# Patient Record
Sex: Male | Born: 1945
Health system: Southern US, Community
[De-identification: ages and names within clinical notes are randomized; demographics above are authoritative.]

## PROBLEM LIST (undated history)

## (undated) DIAGNOSIS — K219 Gastro-esophageal reflux disease without esophagitis: Secondary | ICD-10-CM

## (undated) DIAGNOSIS — N400 Enlarged prostate without lower urinary tract symptoms: Secondary | ICD-10-CM

## (undated) DIAGNOSIS — C3492 Malignant neoplasm of unspecified part of left bronchus or lung: Secondary | ICD-10-CM

## (undated) HISTORY — PX: NO PAST SURGERIES: SHX2092

## (undated) HISTORY — DX: Gastro-esophageal reflux disease without esophagitis: K21.9

---

## 2009-03-19 ENCOUNTER — Ambulatory Visit: Payer: Self-pay | Admitting: Internal Medicine

## 2012-08-08 ENCOUNTER — Ambulatory Visit: Payer: Self-pay | Admitting: Family Medicine

## 2012-08-18 ENCOUNTER — Ambulatory Visit: Payer: Self-pay

## 2013-06-20 ENCOUNTER — Ambulatory Visit: Payer: Self-pay

## 2013-10-24 ENCOUNTER — Ambulatory Visit: Payer: Self-pay

## 2016-04-13 ENCOUNTER — Ambulatory Visit
Admission: EM | Admit: 2016-04-13 | Discharge: 2016-04-13 | Disposition: A | Discharge status: Home / Self Care | Payer: Medicare HMO | Attending: Family Medicine | Admitting: Family Medicine

## 2016-04-13 DIAGNOSIS — J0100 Acute maxillary sinusitis, unspecified: Secondary | ICD-10-CM | POA: Diagnosis not present

## 2016-04-13 MED ORDER — AMOXICILLIN 875 MG PO TABS: 875.0000 mg | ORAL_TABLET | Freq: Two times a day (BID) | ORAL | 0 refills | Status: DC

## 2016-04-13 NOTE — ED Triage Notes (Signed)
Pt c/o head pressure and headache for over a week. Lots of facial pressure.

## 2016-04-13 NOTE — ED Provider Notes (Signed)
[facility]    CSN: [id number] Arrival date & time: 04/13/16  1204     History   Chief Complaint Chief Complaint  Patient presents with  . Sinusitis    HPI [name] is a 71 y.o. male.   The history is provided by the patient.  URI  Presenting symptoms: congestion, cough and facial pain   Severity:  Moderate Onset quality:  Sudden Duration:  1 week Timing:  Constant Progression:  Worsening Chronicity:  New Relieved by:  None tried Associated symptoms: headaches and sinus pain   Associated symptoms: no wheezing   Risk factors: sick contacts   Risk factors: not elderly, no chronic cardiac disease, no chronic kidney disease, no chronic respiratory disease, no diabetes mellitus, no immunosuppression, no recent illness and no recent travel     History reviewed. No pertinent past medical history.  There are no active problems to display for this patient.   History reviewed. No pertinent surgical history.     Home Medications    Prior to Admission medications   Medication Sig Start Date End Date Taking? Authorizing Provider  amoxicillin (AMOXIL) 875 MG tablet Take 1 tablet (875 mg total) by mouth 2 (two) times daily. 04/13/16   Norval Gable, MD    Family History History reviewed. No pertinent family history.  Social History Social History  Substance Use Topics  . Smoking status: Never Smoker  . Smokeless tobacco: Never Used  . Alcohol use No     Allergies   Patient has no known allergies.   Review of Systems Review of Systems  HENT: Positive for congestion and sinus pain.   Respiratory: Positive for cough. Negative for wheezing.   Neurological: Positive for headaches.     Physical Exam Triage Vital Signs ED Triage Vitals  Enc Vitals Group     BP 04/13/16 1256 (!) 152/67     Pulse Rate 04/13/16 1256 67     Resp 04/13/16 1256 18     Temp 04/13/16 1256 98.2 F (36.8 C)     Temp Source 04/13/16 1256 Oral     SpO2 04/13/16  1256 99 %     Weight 04/13/16 1255 265 lb (120.2 kg)     Height 04/13/16 1255 '5\' 11"'$  (1.803 m)     Head Circumference --      Peak Flow --      Pain Score 04/13/16 1256 5     Pain Loc --      Pain Edu? --      Excl. in [place]? --    No data found.   Updated Vital Signs BP (!) 152/67 (BP Location: Left Arm)   Pulse 67   Temp 98.2 F (36.8 C) (Oral)   Resp 18   Ht '5\' 11"'$  (1.803 m)   Wt 265 lb (120.2 kg)   SpO2 99%   BMI 36.96 kg/m   Visual Acuity Right Eye Distance:   Left Eye Distance:   Bilateral Distance:    Right Eye Near:   Left Eye Near:    Bilateral Near:     Physical Exam  Constitutional: He appears well-developed and well-nourished. No distress.  HENT:  Head: Normocephalic and atraumatic.  Right Ear: Tympanic membrane, external ear and ear canal normal.  Left Ear: Tympanic membrane, external ear and ear canal normal.  Nose: Right sinus exhibits maxillary sinus tenderness and frontal sinus tenderness. Left sinus exhibits maxillary sinus tenderness and frontal sinus tenderness.  Mouth/Throat: Uvula is midline,  oropharynx is clear and moist and mucous membranes are normal. No oropharyngeal exudate or tonsillar abscesses.  Eyes: Conjunctivae and EOM are normal. Pupils are equal, round, and reactive to light. Right eye exhibits no discharge. Left eye exhibits no discharge. No scleral icterus.  Neck: Normal range of motion. Neck supple. No tracheal deviation present. No thyromegaly present.  Cardiovascular: Normal rate, regular rhythm and normal heart sounds.   Pulmonary/Chest: Effort normal and breath sounds normal. No stridor. No respiratory distress. He has no wheezes. He has no rales. He exhibits no tenderness.  Lymphadenopathy:    He has no cervical adenopathy.  Neurological: He is alert.  Skin: Skin is warm and dry. No rash noted. He is not diaphoretic.  Nursing note and vitals reviewed.    UC Treatments / Results  Labs (all labs ordered are listed, but only  abnormal results are displayed) Labs Reviewed - No data to display  EKG  EKG Interpretation None       Radiology No results found.  Procedures Procedures (including critical care time)  Medications Ordered in UC Medications - No data to display   Initial Impression / Assessment and Plan / UC Course  I have reviewed the triage vital signs and the nursing notes.  Pertinent labs & imaging results that were available during my care of the patient were reviewed by me and considered in my medical decision making (see chart for details).       Final Clinical Impressions(s) / UC Diagnoses   Final diagnoses:  Acute maxillary sinusitis, recurrence not specified    New Prescriptions Discharge Medication List as of 04/13/2016  2:15 PM    START taking these medications   Details  amoxicillin (AMOXIL) 875 MG tablet Take 1 tablet (875 mg total) by mouth 2 (two) times daily., Starting Wed 04/13/2016, Normal       1. diagnosis reviewed with patient 2. rx as per orders above; reviewed possible side effects, interactions, risks and benefits  3. Recommend supportive treatment with otc flonase 4. Follow-up prn if symptoms worsen or don't improve   Norval Gable, MD 04/13/16 [phone]

## 2016-04-17 ENCOUNTER — Encounter: Payer: Self-pay | Admitting: Gynecology

## 2016-04-17 ENCOUNTER — Ambulatory Visit
Admission: EM | Admit: 2016-04-17 | Discharge: 2016-04-17 | Disposition: A | Discharge status: Home / Self Care | Payer: Medicare HMO | Attending: Family Medicine | Admitting: Family Medicine

## 2016-04-17 ENCOUNTER — Ambulatory Visit (INDEPENDENT_AMBULATORY_CARE_PROVIDER_SITE_OTHER): Payer: Medicare HMO

## 2016-04-17 DIAGNOSIS — I6782 Cerebral ischemia: Secondary | ICD-10-CM

## 2016-04-17 DIAGNOSIS — R519 Headache, unspecified: Secondary | ICD-10-CM

## 2016-04-17 DIAGNOSIS — R51 Headache: Secondary | ICD-10-CM

## 2016-04-17 MED ORDER — CEFUROXIME AXETIL 500 MG PO TABS: 500.0000 mg | ORAL_TABLET | Freq: Two times a day (BID) | ORAL | 0 refills | Status: DC

## 2016-04-17 MED ORDER — FEXOFENADINE-PSEUDOEPHED ER 180-240 MG PO TB24
1.0000 | ORAL_TABLET | Freq: Every day | ORAL | 0 refills | Status: DC
Start: 2016-04-17 — End: 2016-09-07

## 2016-04-17 NOTE — ED Provider Notes (Signed)
[facility]    CSN: [id number] Arrival date & time: 04/17/16  1410     History   Chief Complaint Chief Complaint  Patient presents with  . Headache    HPI [name] is a 71 y.o. male.   Patient's here because of head fullness and pressure. He states about 2 weeks she's had his head fullness and dizziness. He was seen here on Wednesday and told he had a sinus infection and placement amoxicillin. Reports no improvement with amoxicillin and all he can tell these take anything different headache in the head fullness. Reports that the headache is more as stated above the head fullness and his pressure. He had a sister who had a stroke but no other history of CVA in the family. No previous surgeries or operations. He does not smoke. Denies any chronic medication treatment.   The history is provided by the patient. No language interpreter was used.    History reviewed. No pertinent past medical history.  There are no active problems to display for this patient.   History reviewed. No pertinent surgical history.     Home Medications    Prior to Admission medications   Medication Sig Start Date End Date Taking? Authorizing Provider  cefUROXime (CEFTIN) 500 MG tablet Take 1 tablet (500 mg total) by mouth 2 (two) times daily. Do not take with Augmentin 04/17/16   Frederich Cha, MD  fexofenadine-pseudoephedrine (ALLEGRA-D ALLERGY & CONGESTION) 180-240 MG 24 hr tablet Take 1 tablet by mouth daily. 04/17/16   Frederich Cha, MD    Family History No family history on file.  Social History Social History  Substance Use Topics  . Smoking status: Never Smoker  . Smokeless tobacco: Never Used  . Alcohol use No     Allergies   Patient has no known allergies.   Review of Systems Review of Systems  Constitutional: Negative for chills, diaphoresis and fever.  HENT: Negative for rhinorrhea, sinus pain, sinus pressure and sneezing.   Eyes: Negative for pain and redness.    Cardiovascular: Negative for chest pain.  Neurological: Positive for dizziness and headaches. Negative for tremors and weakness.  All other systems reviewed and are negative.    Physical Exam Triage Vital Signs ED Triage Vitals  Enc Vitals Group     BP 04/17/16 1431 137/69     Pulse Rate 04/17/16 1431 89     Resp 04/17/16 1431 16     Temp 04/17/16 1431 98 F (36.7 C)     Temp Source 04/17/16 1431 Oral     SpO2 04/17/16 1431 98 %     Weight 04/17/16 1434 265 lb (120.2 kg)     Height --      Head Circumference --      Peak Flow --      Pain Score 04/17/16 1435 7     Pain Loc --      Pain Edu? --      Excl. in [place]? --    No data found.   Updated Vital Signs BP 137/69 (BP Location: Left Arm)   Pulse 89   Temp 98 F (36.7 C) (Oral)   Resp 16   Wt 265 lb (120.2 kg)   SpO2 98%   BMI 36.96 kg/m   Visual Acuity Right Eye Distance:   Left Eye Distance:   Bilateral Distance:    Right Eye Near:   Left Eye Near:    Bilateral Near:     Physical  Exam  Constitutional: He is oriented to person, place, and time. He appears well-developed and well-nourished.  HENT:  Head: Normocephalic and atraumatic.  Right Ear: External ear normal.  Left Ear: External ear normal.  Nose: Nose normal. No mucosal edema. Right sinus exhibits no maxillary sinus tenderness and no frontal sinus tenderness. Left sinus exhibits no maxillary sinus tenderness and no frontal sinus tenderness.  Mouth/Throat: Oropharynx is clear and moist.  Eyes: Conjunctivae and EOM are normal. Pupils are equal, round, and reactive to light. Lids are everted and swept, no foreign bodies found. Right conjunctiva is not injected. Right conjunctiva has no hemorrhage. Left conjunctiva is not injected. Left conjunctiva has no hemorrhage. Right pupil is reactive.  Neck: Trachea normal. Neck supple. No tracheal tenderness present.  Cardiovascular: Normal rate, regular rhythm and normal heart sounds.   Pulmonary/Chest: Effort  normal. No respiratory distress.  Abdominal: Normal appearance and bowel sounds are normal.  Musculoskeletal: Normal range of motion.  Neurological: He is alert and oriented to person, place, and time.  Skin: Skin is warm and dry.  Vitals reviewed.    UC Treatments / Results  Labs (all labs ordered are listed, but only abnormal results are displayed) Labs Reviewed - No data to display  EKG  EKG Interpretation None       Radiology Ct Head Wo Contrast  Result Date: 04/17/2016 CLINICAL DATA:  Headache for 2 weeks. EXAM: CT HEAD WITHOUT CONTRAST TECHNIQUE: Contiguous axial images were obtained from the base of the skull through the vertex without intravenous contrast. COMPARISON:  None. FINDINGS: Brain: Minimal chronic ischemic white matter disease is noted. No mass effect or midline shift is noted. Ventricular size is within normal limits. There is no evidence of mass lesion, hemorrhage or acute infarction. Vascular: Atherosclerosis of carotid siphons is noted. Skull: Normal. Negative for fracture or focal lesion. Sinuses/Orbits: No acute finding. Other: None. IMPRESSION: Minimal chronic ischemic white matter disease. No acute intracranial abnormality seen. Electronically Signed   By: Marijo Conception, M.D.   On: 04/17/2016 15:38    Procedures Procedures (including critical care time)  Medications Ordered in UC Medications - No data to display   Initial Impression / Assessment and Plan / UC Course  I have reviewed the triage vital signs and the nursing notes.  Pertinent labs & imaging results that were available during my care of the patient were reviewed by me and considered in my medical decision making (see chart for details).   CT scan shows no acute intracranial abnormalities Term that ischemic white matter may or may not be significant for the time being I recommend one baby aspirin a day just improve blood flow but I will change his antibiotic from amoxicillin to Ceftin 500  improve muscle Allegra-D to see if there are any components of sinuses causing his headache or dizziness strongly suggest a follows up with his PCP in 5-10 days to be reevaluated at that point may need to see a neurologist referred by his PCP or they may want to consider putting him on a stronger blood thinner..   Final Clinical Impressions(s) / UC Diagnoses   Final diagnoses:  Acute nonintractable headache, unspecified headache type  Ischemic changes on computed tomography of head    New Prescriptions Discharge Medication List as of 04/17/2016  3:56 PM    START taking these medications   Details  cefUROXime (CEFTIN) 500 MG tablet Take 1 tablet (500 mg total) by mouth 2 (two) times daily. Do not take  with Augmentin, Starting Sun 04/17/2016, Normal    fexofenadine-pseudoephedrine (ALLEGRA-D ALLERGY & CONGESTION) 180-240 MG 24 hr tablet Take 1 tablet by mouth daily., Starting Sun 04/17/2016, Normal         Frederich Cha, MD 04/17/16 1600

## 2016-04-17 NOTE — ED Triage Notes (Signed)
Patient was seen for same symptoms on 04/14/2015. Per patient not better.

## 2016-08-14 DIAGNOSIS — C3492 Malignant neoplasm of unspecified part of left bronchus or lung: Secondary | ICD-10-CM

## 2016-08-14 HISTORY — DX: Malignant neoplasm of unspecified part of left bronchus or lung: C34.92

## 2016-08-31 ENCOUNTER — Ambulatory Visit (INDEPENDENT_AMBULATORY_CARE_PROVIDER_SITE_OTHER): Payer: Medicare HMO

## 2016-08-31 ENCOUNTER — Ambulatory Visit
Admission: EM | Admit: 2016-08-31 | Discharge: 2016-08-31 | Disposition: A | Discharge status: Home / Self Care | Payer: Medicare HMO | Attending: Family Medicine | Admitting: Family Medicine

## 2016-08-31 ENCOUNTER — Ambulatory Visit
Admit: 2016-08-31 | Discharge: 2016-08-31 | Disposition: A | Discharge status: Home / Self Care | Payer: Medicare HMO | Attending: Family Medicine | Admitting: Family Medicine

## 2016-08-31 DIAGNOSIS — R918 Other nonspecific abnormal finding of lung field: Secondary | ICD-10-CM

## 2016-08-31 DIAGNOSIS — L739 Follicular disorder, unspecified: Secondary | ICD-10-CM

## 2016-08-31 DIAGNOSIS — R042 Hemoptysis: Secondary | ICD-10-CM | POA: Diagnosis not present

## 2016-08-31 DIAGNOSIS — R05 Cough: Secondary | ICD-10-CM | POA: Diagnosis not present

## 2016-08-31 DIAGNOSIS — J22 Unspecified acute lower respiratory infection: Secondary | ICD-10-CM

## 2016-08-31 DIAGNOSIS — Z79899 Other long term (current) drug therapy: Secondary | ICD-10-CM | POA: Diagnosis not present

## 2016-08-31 DIAGNOSIS — R059 Cough, unspecified: Secondary | ICD-10-CM

## 2016-08-31 LAB — CBC WITH DIFFERENTIAL/PLATELET
Basophils Absolute: 0.1 10*3/uL (ref 0–0.1)
Basophils Relative: 1 %
EOS ABS: 0.4 10*3/uL (ref 0–0.7)
EOS PCT: 4 %
HCT: 45.6 % (ref 40.0–52.0)
Hemoglobin: 14.9 g/dL (ref 13.0–18.0)
LYMPHS ABS: 2.2 10*3/uL (ref 1.0–3.6)
Lymphocytes Relative: 22 %
MCH: 28.5 pg (ref 26.0–34.0)
MCHC: 32.5 g/dL (ref 32.0–36.0)
MCV: 87.6 fL (ref 80.0–100.0)
Monocytes Absolute: 0.8 10*3/uL (ref 0.2–1.0)
Monocytes Relative: 8 %
Neutro Abs: 6.6 10*3/uL — ABNORMAL HIGH (ref 1.4–6.5)
Neutrophils Relative %: 65 %
PLATELETS: 200 10*3/uL (ref 150–440)
RBC: 5.21 MIL/uL (ref 4.40–5.90)
RDW: 14.6 % — ABNORMAL HIGH (ref 11.5–14.5)
WBC: 10.1 10*3/uL (ref 3.8–10.6)

## 2016-08-31 LAB — BASIC METABOLIC PANEL
Anion gap: 4 — ABNORMAL LOW (ref 5–15)
BUN: 13 mg/dL (ref 6–20)
CO2: 27 mmol/L (ref 22–32)
CREATININE: 1.15 mg/dL (ref 0.61–1.24)
Calcium: 9.1 mg/dL (ref 8.9–10.3)
Chloride: 106 mmol/L (ref 101–111)
GFR calc Af Amer: >60 mL/min (ref >60)
Glucose, Bld: 98 mg/dL (ref 65–99)
Potassium: 4.2 mmol/L (ref 3.5–5.1)
SODIUM: 137 mmol/L (ref 135–145)

## 2016-08-31 MED ORDER — IOPAMIDOL (ISOVUE-300) INJECTION 61%
75.0000 mL | Freq: Once | INTRAVENOUS | Status: AC | PRN
  Administered 2016-08-31: 75 mL via INTRAVENOUS

## 2016-08-31 MED ORDER — HYDROCOD POLST-CPM POLST ER 10-8 MG/5ML PO SUER: 5.0000 mL | Freq: Two times a day (BID) | ORAL | 0 refills | Status: DC | PRN

## 2016-08-31 MED ORDER — AZITHROMYCIN 500 MG PO TABS: ORAL_TABLET | ORAL | 0 refills | Status: DC

## 2016-08-31 MED ORDER — MUPIROCIN 2 % EX OINT: 1.0000 "application " | TOPICAL_OINTMENT | Freq: Two times a day (BID) | CUTANEOUS | 0 refills | Status: DC

## 2016-08-31 NOTE — Discharge Instructions (Signed)
She was informed in her chest x-rays negative for any type of cancer because of his history of smoking he should have low radiation CT of the chest probably for screening but we will that be decided by him and his PCP as to how best to proceed.   Due to abnormal chest x-ray showing a mass in the left upper quadrant patient proceed with CT scan tonight with contrast for further evaluation. Will obtain CMP CBC and discharge him from the urgent care and then proceed with CT scan.

## 2016-08-31 NOTE — ED Triage Notes (Signed)
71 year old [place] American male is here today with complaints a productive cough that started Sunday.

## 2016-08-31 NOTE — ED Provider Notes (Signed)
[facility]    CSN: [id number] Arrival date & time: 08/31/16  1637     History   Chief Complaint Chief Complaint  Patient presents with  . Cough    HPI [name] is a 71 y.o. male.   The history is provided by the patient.  Cough    History reviewed. No pertinent past medical history.  There are no active problems to display for this patient.   History reviewed. No pertinent surgical history.     Home Medications    Prior to Admission medications   Medication Sig Start Date End Date Taking? Authorizing Provider  fexofenadine-pseudoephedrine (ALLEGRA-D ALLERGY & CONGESTION) 180-240 MG 24 hr tablet Take 1 tablet by mouth daily. 04/17/16  Yes Frederich Cha, MD  azithromycin (ZITHROMAX) 500 MG tablet 1 tablet day for 5 days 08/31/16   Frederich Cha, MD  chlorpheniramine-HYDROcodone [facility]) 10-8 MG/5ML SUER Take 5 mLs by mouth every 12 (twelve) hours as needed. 08/31/16   Frederich Cha, MD  mupirocin ointment (BACTROBAN) 2 % Apply 1 application topically 2 (two) times daily. 08/31/16   Frederich Cha, MD    Family History History reviewed. No pertinent family history.  Social History Social History  Substance Use Topics  . Smoking status: Never Smoker  . Smokeless tobacco: Never Used  . Alcohol use No     Allergies   Patient has no known allergies.   Review of Systems Review of Systems  Respiratory: Positive for cough.      Physical Exam Triage Vital Signs ED Triage Vitals  Enc Vitals Group     BP 08/31/16 1649 (!) 155/76     Pulse Rate 08/31/16 1649 83     Resp 08/31/16 1649 16     Temp 08/31/16 1649 98.6 F (37 C)     Temp Source 08/31/16 1649 Oral     SpO2 08/31/16 1649 97 %     Weight 08/31/16 1652 264 lb (119.7 kg)     Height 08/31/16 1652 5\' 11"  (1.803 m)     Head Circumference --      Peak Flow --      Pain Score --      Pain Loc --      Pain Edu? --      Excl. in [place]? --    No data found.   Updated  Vital Signs BP (!) 155/76 (BP Location: Left Arm)   Pulse 83   Temp 98.6 F (37 C) (Oral)   Resp 16   Ht 5\' 11"  (1.803 m)   Wt 264 lb (119.7 kg)   SpO2 97%   BMI 36.82 kg/m   Visual Acuity Right Eye Distance:   Left Eye Distance:   Bilateral Distance:    Right Eye Near:   Left Eye Near:    Bilateral Near:     Physical Exam   UC Treatments / Results  Labs (all labs ordered are listed, but only abnormal results are displayed) Labs Reviewed  CBC WITH DIFFERENTIAL/PLATELET - Abnormal; Notable for the following:       Result Value   RDW 14.6 (*)    Neutro Abs 6.6 (*)    All other components within normal limits  BASIC METABOLIC PANEL - Abnormal; Notable for the following:    Anion gap 4 (*)    All other components within normal limits    EKG  EKG Interpretation None       Radiology Dg Chest 2 View  Result Date: 08/31/2016 CLINICAL DATA:  Hemoptysis EXAM: CHEST  2 VIEW COMPARISON:  August 08, 2012 FINDINGS: There is a mass in the posterior segment of the left upper lobe measuring 4.0 x 3.4 x 2.5 cm. Lungs elsewhere clear. Heart size and pulmonary vascularity are normal. No adenopathy. No evident bone lesions. IMPRESSION: Mass measuring 4.0 x 3.4 x 2.5 cm arising in the posterior segment left upper lobe. This mass is concerning for neoplasm. Advise chest CT, ideally with intravenous contrast, to further evaluate. Lungs elsewhere clear. No evident adenopathy by radiography. These results will be called to the ordering clinician or representative by the Radiologist Assistant, and communication documented in the PACS or zVision Dashboard. Electronically Signed   By: Lowella Grip III M.D.   On: 08/31/2016 17:54   Ct Chest W Contrast  Result Date: 08/31/2016 CLINICAL DATA:  71 y/o  M; abnormal chest radiograph EXAM: CT CHEST WITH CONTRAST TECHNIQUE: Multidetector CT imaging of the chest was performed during intravenous contrast administration. CONTRAST:  49mL ISOVUE-300  IOPAMIDOL (ISOVUE-300) INJECTION 61% COMPARISON:  08/31/2016 chest radiograph. FINDINGS: Cardiovascular: Normal heart size. No pericardial effusion. Moderate coronary artery calcification predominantly in LAD. Normal caliber thoracic aorta with mild calcific atherosclerosis. Mediastinum/Nodes: Left AP window adenopathy measuring 20 x 14 mm (series 2, image 62). Lungs/Pleura: Left upper lobe lobulated pulmonary mass measuring 33 x 28 x 35 mm (AP x ML x CC series 3, image 69 and series 602, image 18). 8 mm nodule separate from the mass chest anteriorly. Right lower lobe, 6 mm, series 3 image 110. Moderate centrilobular and paraseptal emphysema with upper lobe predominance and mild fibrotic changes at the lung bases. Upper Abdomen: 17 mm well-circumscribed fluid attenuating lucency in segment 7 of the liver, probably a cyst. Left kidney upper pole 22 mm cyst. Musculoskeletal: No chest wall abnormality. No acute or significant osseous findings. IMPRESSION: 1. Left peripheral upper lobe mass measuring up to 3.5 cm, likely primary lung cancer. 2. 8 mm satellite nodule just anterior to the mass. 3. Aortopulmonary window lymphadenopathy, possibly metastatic. 4. Nonspecific 6 mm right lower lobe pulmonary nodule. 5. Moderate emphysema and mild peripheral basilar fibrosis. These results were called by telephone at the time of interpretation on 08/31/2016 at 7:33 pm to Dr. Frederich Cha , who verbally acknowledged these results. Electronically Signed   By: Kristine Garbe M.D.   On: 08/31/2016 19:35    Procedures Procedures (including critical care time)  Medications Ordered in UC Medications - No data to display   Initial Impression / Assessment and Plan / UC Course  I have reviewed the triage vital signs and the nursing notes.  Pertinent labs & imaging results that were available during my care of the patient were reviewed by me and considered in my medical decision making (see chart for details).        Final Clinical Impressions(s) / UC Diagnoses   Final diagnoses:  Cough  Hemoptysis  Lower respiratory infection  Folliculitis  Abnormality of lung on CXR    New Prescriptions New Prescriptions   AZITHROMYCIN (ZITHROMAX) 500 MG TABLET    1 tablet day for 5 days   CHLORPHENIRAMINE-HYDROCODONE (TUSSIONEX PENNKINETIC ER) 10-8 MG/5ML SUER    Take 5 mLs by mouth every 12 (twelve) hours as needed.   MUPIROCIN OINTMENT (BACTROBAN) 2 %    Apply 1 application topically 2 (two) times daily.    Discussed radiologist. Patient does have a lung mass suspicious for cancer. Bronchoscopy does not appear to be  of option and radiologist feels that CT that biopsy would be difficult as well due to location and vasculature we'll try to contact the general surgeon or thoracic surgeon for him tomorrow for further evaluation and treatment. Discussed this with patient and his family   Note: This dictation was prepared with Dragon dictation along with smaller phrase technology. Any transcriptional errors that result from this process are unintentional.   Frederich Cha, MD 08/31/16 1952

## 2016-09-02 ENCOUNTER — Ambulatory Visit (INDEPENDENT_AMBULATORY_CARE_PROVIDER_SITE_OTHER): Payer: Medicare HMO | Admitting: Cardiothoracic Surgery

## 2016-09-02 ENCOUNTER — Encounter: Payer: Self-pay | Admitting: Cardiothoracic Surgery

## 2016-09-02 ENCOUNTER — Ambulatory Visit: Payer: Medicare HMO | Admitting: Cardiothoracic Surgery

## 2016-09-02 VITALS — BP 153/80 | HR 85 | Temp 98.3°F | Resp 18 | Ht 71.0 in | Wt 261.2 lb

## 2016-09-02 DIAGNOSIS — R918 Other nonspecific abnormal finding of lung field: Secondary | ICD-10-CM

## 2016-09-02 NOTE — Patient Instructions (Addendum)
I will call you later today or on Monday with an appointment time and date to have the Pulmonary Functions testing done. We have scheduled the PET scan. Please see the information listed below. Please see your follow up appointment listed below.   We have scheduled you for a PET Scan from your Head to your Thighs. This has been scheduled on 09/06/16 at the [facility]  location. Please arrive at 12:00 with a list of your medications, a photo ID, and insurance card.  Please have nothing to eat or drink after midnight prior to your PET Scan.   If you are diabetic, skip your morning dose of medication.  If you need to reschedule your Scan, you may do so by calling [phone].

## 2016-09-02 NOTE — Progress Notes (Signed)
Patient ID: [name], male   DOB: Jul 07, 1945, 71 y.o.   MRN: [id number]  Chief Complaint  Patient presents with  . New Patient (Initial Visit)    Left Peripheral Upper Lobe mass 3.5 cm    Referred By Urgent care center Reason for Referral left upper lobe mass this patient  HPI Location, Quality, Duration, Severity, Timing, Context, Modifying Factors, Associated Signs and Symptoms.  [name] is a 71 y.o. male.  This patient is a 71 year old African-American male who has a remote smoking history having smoked about 14 years of one pack per day. He quit smoking back in the 1990s. He does have a significant family history of lung cancer and several siblings as well as lymphoma in his mother. He was in his usual state of health until about 2 weeks ago when he began coughing and coughing up small amounts of blood-tinged sputum. Over the weekend he had a coughing spell and had more significant hemoptysis and presented to an urgent care center where chest x-ray was obtained revealing a left upper lobe mass. He then had a CT scan showing a 3-4 cm mass in the left upper lobe with a satellite lesion as well as mediastinal adenopathy. Patient comes in today for further evaluation and workup. He states that he does not get short of breath. 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 He does not complain of any significant chest pain or chest discomfort. He did have a chest x-ray made 4 years ago which have independently reviewed. There was no mass present at that time.   History reviewed. No pertinent past medical history.  History reviewed. No pertinent surgical history.  Family History  Problem Relation Age of Onset  . Lymphoma Mother   . Diabetes Mother   . Lung cancer Sister   . Lung cancer Brother   . Lung cancer Brother     Social History Social History  Substance Use Topics  . Smoking status: Former Smoker    Packs/day: 1.00    Years: 15.00    Types: Cigarettes  .  Smokeless tobacco: Never Used  . Alcohol use No    No Known Allergies  Current Outpatient Prescriptions  Medication Sig Dispense Refill  . cholecalciferol (VITAMIN D) 1000 units tablet Take 1,000 Units by mouth daily.    . cyanocobalamin 500 MCG tablet Take 500 mcg by mouth daily.    . Garlic 10 MG CAPS Take by mouth.    . Omega-3 Fatty Acids (FISH OIL) 1000 MG CAPS Take by mouth.    Marland Kitchen azithromycin (ZITHROMAX) 500 MG tablet 1 tablet day for 5 days 5 tablet 0  . chlorpheniramine-HYDROcodone (TUSSIONEX PENNKINETIC ER) 10-8 MG/5ML SUER Take 5 mLs by mouth every 12 (twelve) hours as needed. 115 mL 0  . fexofenadine-pseudoephedrine (ALLEGRA-D ALLERGY & CONGESTION) 180-240 MG 24 hr tablet Take 1 tablet by mouth daily. 30 tablet 0  . mupirocin ointment (BACTROBAN) 2 % Apply 1 application topically 2 (two) times daily. 22 g 0   No current facility-administered medications for this visit.       Review of Systems A complete review of systems was asked and was negative except for the following positive findingsFrequent cough, coughing up blood.  Blood pressure (!) 153/80, pulse 85, temperature 98.3 F (36.8 C), temperature source Oral, resp. rate 18, height 5\' 11"  (1.803 m), weight 261 lb 3.2 oz (118.5 kg), SpO2 96 %.  Physical Exam CONSTITUTIONAL:  Pleasant, well-developed, well-nourished, and  in no acute distress. EYES: Pupils equal and reactive to light, Sclera non-icteric EARS, NOSE, MOUTH AND THROAT:  The oropharynx was clear.  Dentition is good repair.  Oral mucosa pink and moist. LYMPH NODES:  Lymph nodes in the neck and axillae were normal RESPIRATORY:  Lungs were clear.  Normal respiratory effort without pathologic use of accessory muscles of respiration CARDIOVASCULAR: Heart was regular without murmurs.  There were no carotid bruits. GI: The abdomen was soft, nontender, and nondistended. There were no palpable masses. There was no hepatosplenomegaly. There were normal bowel sounds  in all quadrants. GU:  Rectal deferred.   MUSCULOSKELETAL:  Normal muscle strength and tone.  No clubbing or cyanosis.   SKIN:  There were no pathologic skin lesions.  There were no nodules on palpation. NEUROLOGIC:  Sensation is normal.  Cranial nerves are grossly intact. PSYCH:  Oriented to person, place and time.  Mood and affect are normal.  Data Reviewed CT scan and chest x-rays  I have personally reviewed the patient's imaging, laboratory findings and medical records.    Assessment    I have independently reviewed the patient's CT scan. I believe that he most likely has a left upper lobe malignancy with metastases to the hilar and mediastinal lymph nodes. We'll go ahead and get a PET scan and a complete set of pulmonary function studies and I will follow-up once those are completed.    Plan    PET scan and CT scan will be ordered. I will see him back in one week.       Nestor Lewandowsky, MD 09/02/2016, 10:18 AM

## 2016-09-05 ENCOUNTER — Telehealth: Payer: Self-pay

## 2016-09-05 NOTE — Telephone Encounter (Signed)
Patients daughter needs her FMLA filled out. She will be taking care of him. I have place these forms in you box. She will come in to pick them up once they are completed and pay the $25. Her name is [facility] cell phone [phone] Home [phone]

## 2016-09-05 NOTE — Telephone Encounter (Signed)
Pamala Hurry returned call to reschedule PFTs for the following patient. She was able to get the PFTs scheduled for 7/24 at 9:30AM at the [facility]. I will contact patient to let him know of this appointment.

## 2016-09-05 NOTE — Telephone Encounter (Signed)
Spoke with Ms. Kem Parkinson she informed me that the patient was at work. I told her that his PFTs have been rescheduled to tomorrow 09/06/16 at 9:30AM at the [facility]. I also reminded her of his PET Scan for 7/24 at 12:00PM. Told her if they had any questions or concerns to give our office a call. Ms. Cherlyn Cushing verbalized understanding at this time.

## 2016-09-06 ENCOUNTER — Ambulatory Visit (HOSPITAL_COMMUNITY): Payer: Medicare HMO

## 2016-09-06 ENCOUNTER — Encounter
Admission: RE | Admit: 2016-09-06 | Discharge: 2016-09-06 | Disposition: A | Discharge status: Home / Self Care | Payer: Medicare HMO | Source: Ambulatory Visit | Attending: Cardiothoracic Surgery | Admitting: Cardiothoracic Surgery

## 2016-09-06 DIAGNOSIS — R918 Other nonspecific abnormal finding of lung field: Secondary | ICD-10-CM | POA: Diagnosis not present

## 2016-09-06 LAB — GLUCOSE, CAPILLARY
Glucose-Capillary: 11 mg/dL — CL (ref 65–99)
Glucose-Capillary: 77 mg/dL (ref 65–99)

## 2016-09-06 MED ORDER — FLUDEOXYGLUCOSE F - 18 (FDG) INJECTION
12.5400 | Freq: Once | INTRAVENOUS | Status: AC | PRN
  Administered 2016-09-06: 12.54 via INTRAVENOUS

## 2016-09-07 ENCOUNTER — Encounter: Payer: Self-pay | Admitting: Family Medicine

## 2016-09-07 ENCOUNTER — Ambulatory Visit (INDEPENDENT_AMBULATORY_CARE_PROVIDER_SITE_OTHER): Payer: Medicare HMO | Admitting: Family Medicine

## 2016-09-07 DIAGNOSIS — C3492 Malignant neoplasm of unspecified part of left bronchus or lung: Secondary | ICD-10-CM | POA: Insufficient documentation

## 2016-09-07 DIAGNOSIS — E785 Hyperlipidemia, unspecified: Secondary | ICD-10-CM | POA: Diagnosis not present

## 2016-09-07 DIAGNOSIS — K635 Polyp of colon: Secondary | ICD-10-CM

## 2016-09-07 DIAGNOSIS — E669 Obesity, unspecified: Secondary | ICD-10-CM | POA: Insufficient documentation

## 2016-09-07 DIAGNOSIS — C3412 Malignant neoplasm of upper lobe, left bronchus or lung: Secondary | ICD-10-CM

## 2016-09-07 DIAGNOSIS — E559 Vitamin D deficiency, unspecified: Secondary | ICD-10-CM | POA: Diagnosis not present

## 2016-09-07 DIAGNOSIS — E538 Deficiency of other specified B group vitamins: Secondary | ICD-10-CM | POA: Insufficient documentation

## 2016-09-07 MED ORDER — HYDROCOD POLST-CPM POLST ER 10-8 MG/5ML PO SUER: 5.0000 mL | Freq: Two times a day (BID) | ORAL | 0 refills | Status: DC | PRN

## 2016-09-07 NOTE — Progress Notes (Signed)
Date:  09/07/2016   Name:  ZAYQUAN BOGARD   DOB:  [birth year]   MRN:  [id number]  PCP:  Adline Potter, MD    Chief Complaint: Establish Care and Eye Problem (R and L eyes: While waiting in waiting room he has noticed seeing spots and twinkles of lights. Denies dizziness and no nausea. )   History of Present Illness:  This is a 71 y.o. male seen for initial visit, gets usual care at [place], last seen in Dec. Seen [facility] last week for hemoptysis, labs ok but CT showed LUL mass and prob mets c/w primary lung ca, saw surgery 5d ago, PET scan confirmatory, to see surgeon again in 2d. C/o cough which Tussionex helps, occ DOE. Occ CP improved with burping. R sided arm and leg swelling x yrs, uses comp stockings. HLD on fish oil and red yeast, vit D and B12 def on supplements. Mother died 3 lymphoma, had DM, 2 brother and one sister died lung ca. Tet status unknown, pneumo imm last year at [place], zoster imm yrs ago at [place]. Colonoscopy last 2016 showed polyps, gets q 33yrs.  Review of Systems:  Review of Systems  Constitutional: Negative for chills and fever.  HENT: Negative for ear pain, sinus pain, sore throat and trouble swallowing.   Eyes: Negative for pain.  Gastrointestinal: Negative for abdominal pain, constipation, diarrhea, nausea and vomiting.  Genitourinary: Negative for difficulty urinating and dysuria.  Neurological: Negative for dizziness, syncope and light-headedness.    Patient Active Problem List   Diagnosis Date Noted  . Lung cancer ([place]) 09/07/2016  . B12 deficiency 09/07/2016  . Vitamin D deficiency 09/07/2016  . Obesity (BMI 30-39.9) 09/07/2016  . Hyperlipidemia 09/07/2016    Prior to Admission medications   Medication Sig Start Date End Date Taking? Authorizing Provider  chlorpheniramine-HYDROcodone (TUSSIONEX PENNKINETIC ER) 10-8 MG/5ML SUER Take 5 mLs by mouth every 12 (twelve) hours as needed. 09/07/16  Yes Deanza Upperman, Gwyndolyn Saxon, MD  cholecalciferol (VITAMIN D) 1000 units tablet Take  1,000 Units by mouth daily.   Yes [provider]  cyanocobalamin 500 MCG tablet Take 500 mcg by mouth daily.   Yes [provider]  Garlic 10 MG CAPS Take by mouth.   Yes [provider]  mupirocin ointment (BACTROBAN) 2 % Apply 1 application topically 2 (two) times daily. 08/31/16  Yes Frederich Cha, MD  Omega-3 Fatty Acids (FISH OIL) 1000 MG CAPS Take by mouth.   Yes [provider]    No Known Allergies  No past surgical history on file.  Social History  Substance Use Topics  . Smoking status: Former Smoker    Packs/day: 1.00    Years: 15.00    Types: Cigarettes  . Smokeless tobacco: Never Used  . Alcohol use No    Family History  Problem Relation Age of Onset  . Lymphoma Mother   . Diabetes Mother   . Lung cancer Sister   . Lung cancer Brother   . Lung cancer Brother     Medication list has been reviewed and updated.  Physical Examination: BP 138/80   Pulse 83   Resp 16   Ht 5\' 11"  (1.803 m)   Wt 258 lb (117 kg)   SpO2 98%   BMI 35.98 kg/m   Physical Exam  Constitutional: He is oriented to person, place, and time. He appears well-developed and well-nourished.  HENT:  Head: Normocephalic and atraumatic.  Right Ear: External ear normal.  Left Ear: External ear normal.  Nose: Nose normal.  Mouth/Throat: Oropharynx is clear and moist.  TMs obscured by cerumen  Eyes: Pupils are equal, round, and reactive to light. Conjunctivae and EOM are normal.  Neck: Neck supple. No thyromegaly present.  Cardiovascular: Normal rate, regular rhythm and normal heart sounds.   Pulmonary/Chest: Effort normal and breath sounds normal.  Abdominal: Soft. He exhibits no distension and no mass. There is no tenderness.  Musculoskeletal: He exhibits no edema.  Lymphadenopathy:    He has no cervical adenopathy.  Neurological: He is alert and oriented to person, place, and time. Coordination normal.  Romberg neg, gait normal  Skin: Skin is warm and  dry.  Psychiatric: He has a normal mood and affect. His behavior is normal.  Nursing note and vitals reviewed.   Assessment and Plan:  1. Malignant neoplasm of upper lobe of left lung ([place]) For surgery f/u in 2d, possible resection/RT/CTX discussed - Comprehensive Metabolic Panel (CMET)  2. B12 deficiency On supplement - B12  3. Vitamin D deficiency On supplement - Vitamin D (25 hydroxy)  4. Hyperlipidemia, unspecified hyperlipidemia type On fish oil/red yeast  5. Obesity (BMI 30-39.9) Weight stable per pt - TSH  6. Polyp of colon, unspecified part of colon, unspecified type Colonoscopy q26yr through [facility]  7. HM Consider Tdap, Shingrix when medically stable (obtain imm records from [place])  Return in about 4 weeks (around 10/05/2016).   45 mins spent with pt/family over half in counseling  Joren Rehm [facility]  09/07/2016

## 2016-09-08 ENCOUNTER — Other Ambulatory Visit: Payer: Self-pay | Admitting: Family Medicine

## 2016-09-08 LAB — BLOOD GAS, ARTERIAL
ACID-BASE DEFICIT: 2.2 mmol/L — AB (ref 0.0–2.0)
BICARBONATE: 21.6 mmol/L (ref 20.0–28.0)
FIO2: 0.21
O2 SAT: 95.4 %
PATIENT TEMPERATURE: 37
pCO2 arterial: 34 mmHg (ref 32.0–48.0)
pH, Arterial: 7.41 (ref 7.350–7.450)
pO2, Arterial: 77 mmHg — ABNORMAL LOW (ref 83.0–108.0)

## 2016-09-08 LAB — COMPREHENSIVE METABOLIC PANEL
A/G RATIO: 1.1 — AB (ref 1.2–2.2)
ALBUMIN: 4 g/dL (ref 3.5–4.8)
ALK PHOS: 57 IU/L (ref 39–117)
ALT: 20 IU/L (ref 0–44)
AST: 24 IU/L (ref 0–40)
BILIRUBIN TOTAL: 1.1 mg/dL (ref 0.0–1.2)
BUN / CREAT RATIO: 10 (ref 10–24)
BUN: 12 mg/dL (ref 8–27)
CHLORIDE: 101 mmol/L (ref 96–106)
CO2: 21 mmol/L (ref 20–29)
CREATININE: 1.18 mg/dL (ref 0.76–1.27)
Calcium: 9 mg/dL (ref 8.6–10.2)
GFR calc Af Amer: 72 mL/min/{1.73_m2} (ref >59)
GFR calc non Af Amer: 62 mL/min/{1.73_m2} (ref >59)
GLOBULIN, TOTAL: 3.5 g/dL (ref 1.5–4.5)
Glucose: 65 mg/dL (ref 65–99)
POTASSIUM: 4.8 mmol/L (ref 3.5–5.2)
SODIUM: 136 mmol/L (ref 134–144)
Total Protein: 7.5 g/dL (ref 6.0–8.5)

## 2016-09-08 LAB — TSH: TSH: 2.5 u[IU]/mL (ref 0.450–4.500)

## 2016-09-08 LAB — VITAMIN B12: Vitamin B-12: 732 pg/mL (ref 232–1245)

## 2016-09-08 LAB — VITAMIN D 25 HYDROXY (VIT D DEFICIENCY, FRACTURES): Vit D, 25-Hydroxy: 26.1 ng/mL — ABNORMAL LOW (ref 30.0–100.0)

## 2016-09-08 MED ORDER — VITAMIN D3 50 MCG (2000 UT) PO CAPS: 2000.0000 [IU] | ORAL_CAPSULE | Freq: Every day | ORAL | Status: AC

## 2016-09-08 NOTE — Telephone Encounter (Signed)
Patient's daughter Jolayne Haines) FMLA form was filled out. Contacted patient to let her know that it was ready for pick up. She stated that she would pay the $25 and pick it up tomorrow.

## 2016-09-08 NOTE — Progress Notes (Signed)
Increase vitamin D3, d/c fish oil and garlic

## 2016-09-09 ENCOUNTER — Ambulatory Visit (INDEPENDENT_AMBULATORY_CARE_PROVIDER_SITE_OTHER): Payer: Medicare HMO | Admitting: Cardiothoracic Surgery

## 2016-09-09 ENCOUNTER — Telehealth: Payer: Self-pay

## 2016-09-09 ENCOUNTER — Encounter: Payer: Self-pay | Admitting: Cardiothoracic Surgery

## 2016-09-09 VITALS — BP 157/82 | HR 84 | Temp 98.0°F | Ht 71.0 in | Wt 261.2 lb

## 2016-09-09 DIAGNOSIS — R918 Other nonspecific abnormal finding of lung field: Secondary | ICD-10-CM | POA: Diagnosis not present

## 2016-09-09 NOTE — Patient Instructions (Signed)
We have recommended today that you have a CT Guided Biopsy done. We will call you as soon as we have the details available for this appointment. We will call with results as soon as they have been received.  Prior to having Biopsy done, you will need to have labs drawn. I will let you know when this will occur after finding out the date of the biopsy. Also, you may not eat or drink 6 hours prior to the procedure and may not take any Aspirin or Ibuprofen containing products the day of the procedure. If you are on any other Blood Thinners that we are unaware of or that we have not reviewed in the office, please bring this to the nurse's attention.  Call our office with any questions or concerns that you may have.   We have placed a referral today to Radiation and Medical Oncology in regards to your Lung Mass. We will call you with an appointment as soon as it has been made. This process typically takes 5-7 business days. If you do not hear from our office after that time period, please give our office a call so that we may check on the status for you.

## 2016-09-09 NOTE — Progress Notes (Signed)
  Patient ID: [name], male   DOB: February 07, 1946, 71 y.o.   MRN: [id number]  HISTORY: [name] returns today in follow-up. He did have his pulmonary function studies and PET scan performed. He has no new complaints. He did clarify a smoking history. He smoked one pack cigarettes a day for about 25 years and also quit about 25 years ago. He has no known asbestos exposure.   Vitals:   09/09/16 1006  BP: (!) 157/82  Pulse: 84  Temp: 98 F (36.7 C)     EXAM:    Resp: Lungs are clear bilaterally.  No respiratory distress, normal effort. Heart:  Regular without murmurs Abd:  Abdomen is soft, non distended and non tender. No masses are palpable.  There is no rebound and no guarding.  Neurological: Alert and oriented to person, place, and time. Coordination normal.  Skin: Skin is warm and dry. No rash noted. No diaphoretic. No erythema. No pallor.  Psychiatric: Normal mood and affect. Normal behavior. Judgment and thought content normal.    ASSESSMENT: I have independently reviewed the patient's PET scan. The PET scan does confirm hypermetabolic nodes in the hilum and in the AP window. I believe that this is most likely related to a stage III carcinoma the lung and therefore will require radiation therapy and chemotherapy.   PLAN:   I have made an appointment for him to meet with our oncologist and radiation therapist after a CT-guided needle biopsy of the left upper lobe mass. He will contact my office 2 days after the biopsy is scheduled to review the results. He understands that surgical resection is unlikely in the event that this turns out to be a malignancy.    Nestor Lewandowsky, MD

## 2016-09-09 NOTE — Telephone Encounter (Signed)
FMLA Paperwork has been received and a $25.00 collection fee has been obtained.   Patient has picked up her forms

## 2016-09-09 NOTE — Telephone Encounter (Signed)
Patient was seen by Dr. Genevive Bi today and is in need of CT Guided Lung Biopsy. Order placed and signed. Call made to specialty scheduling. No answer. Left voicemail for return phone call.

## 2016-09-12 ENCOUNTER — Telehealth: Payer: Self-pay

## 2016-09-12 NOTE — Telephone Encounter (Signed)
Contacting Speciality Scheduling to see when we could get the patient's CT Guidied biopsy scheduled. I left a message and asked for them to please give [place] a call back.

## 2016-09-12 NOTE — Telephone Encounter (Signed)
[name] gave our office a call asking if we had been able to schedule the CT Guided Biopsy. I informed her that we have not heard back from scheduling yet and that we have left several messages with the to call us back. I informed her that once we hear back from them that we will contact her with a date and time. Ms. Cherlyn Cushing verbalized understanding at this time.

## 2016-09-13 ENCOUNTER — Ambulatory Visit: Payer: Medicare HMO

## 2016-09-13 ENCOUNTER — Encounter: Payer: Self-pay | Admitting: *Deleted

## 2016-09-13 NOTE — Progress Notes (Signed)
  Oncology Nurse Navigator Documentation  Navigator Location: [facility] (09/13/16 1300) Referral date to RadOnc/MedOnc: 09/09/16 (09/13/16 1300) )Navigator Encounter Type: Introductory phone call;Telephone (09/13/16 1300) Telephone: [name] Call;Appt Confirmation/Clarification (09/13/16 1300) Abnormal Finding Date: 08/31/16 (09/13/16 1300)                     Barriers/Navigation Needs: Coordination of Care (09/13/16 1300)   Interventions: Coordination of Care (09/13/16 1300)   Coordination of Care: Appts (09/13/16 1300)        Acuity: Level 2 (09/13/16 1300)   Acuity Level 2: Educational needs;Assistance expediting appointments;Ongoing guidance and education throughout treatment as needed (09/13/16 1300)    Phone call made to patient to introduce to navigator services and to give appt information. Spoke with pt's significant other, [name]. Informed of appt on 8/15 at 9am with Dr. Tasia Catchings and appt on 8/15 at 10am with Dr. Signe Colt was informed that pt is being seen after biopsy so we can have those results available at the time of appt to review and discuss treatment planning if needed. [name] and confirmed appt at the cancer center on 8/15. Instructed to call back with any questions prior to appt. [name] verbalized understanding.     Time Spent with Patient: 15 (09/13/16 1300)

## 2016-09-13 NOTE — Telephone Encounter (Signed)
Received a call from [facility] Curator) and she gave me the appointment date/time. I then called patient but spoke with his wife-[name] and told her that his CT Lung Biopsy will be on 09/22/2016 at 8:00 AM at the [facility]. Mrs. [name] understood and had no further questions.

## 2016-09-13 NOTE — Telephone Encounter (Signed)
Error

## 2016-09-21 ENCOUNTER — Other Ambulatory Visit: Payer: Self-pay | Admitting: Radiology

## 2016-09-22 ENCOUNTER — Ambulatory Visit
Admission: RE | Admit: 2016-09-22 | Discharge: 2016-09-22 | Disposition: A | Discharge status: Home / Self Care | Payer: Medicare HMO | Source: Ambulatory Visit | Attending: Cardiothoracic Surgery | Admitting: Cardiothoracic Surgery

## 2016-09-22 ENCOUNTER — Ambulatory Visit
Admission: RE | Admit: 2016-09-22 | Discharge: 2016-09-22 | Disposition: A | Discharge status: Home / Self Care | Payer: Medicare HMO | Source: Ambulatory Visit | Attending: Interventional Radiology | Admitting: Interventional Radiology

## 2016-09-22 DIAGNOSIS — F17210 Nicotine dependence, cigarettes, uncomplicated: Secondary | ICD-10-CM | POA: Insufficient documentation

## 2016-09-22 DIAGNOSIS — Z79899 Other long term (current) drug therapy: Secondary | ICD-10-CM | POA: Insufficient documentation

## 2016-09-22 DIAGNOSIS — R918 Other nonspecific abnormal finding of lung field: Secondary | ICD-10-CM

## 2016-09-22 DIAGNOSIS — C3412 Malignant neoplasm of upper lobe, left bronchus or lung: Secondary | ICD-10-CM | POA: Insufficient documentation

## 2016-09-22 DIAGNOSIS — Z9889 Other specified postprocedural states: Secondary | ICD-10-CM

## 2016-09-22 LAB — CBC
HCT: 46.1 % (ref 40.0–52.0)
HEMOGLOBIN: 15.1 g/dL (ref 13.0–18.0)
MCH: 27.9 pg (ref 26.0–34.0)
MCHC: 32.8 g/dL (ref 32.0–36.0)
MCV: 85 fL (ref 80.0–100.0)
PLATELETS: 209 10*3/uL (ref 150–440)
RBC: 5.43 MIL/uL (ref 4.40–5.90)
RDW: 14.1 % (ref 11.5–14.5)
WBC: 10 10*3/uL (ref 3.8–10.6)

## 2016-09-22 LAB — PROTIME-INR
INR: 1.11
PROTHROMBIN TIME: 14.4 s (ref 11.4–15.2)

## 2016-09-22 LAB — APTT: APTT: 31 s (ref 24–36)

## 2016-09-22 MED ORDER — FENTANYL CITRATE (PF) 100 MCG/2ML IJ SOLN
INTRAMUSCULAR | Status: AC | PRN
  Administered 2016-09-22 (×2): 50 ug via INTRAVENOUS

## 2016-09-22 MED ORDER — SODIUM CHLORIDE 0.9 % IV SOLN
INTRAVENOUS | Status: DC
  Administered 2016-09-22: 09:00:00 via INTRAVENOUS

## 2016-09-22 MED ORDER — MIDAZOLAM HCL 2 MG/2ML IJ SOLN
INTRAMUSCULAR | Status: AC
  Filled 2016-09-22: qty 4

## 2016-09-22 MED ORDER — FENTANYL CITRATE (PF) 100 MCG/2ML IJ SOLN
INTRAMUSCULAR | Status: AC
  Filled 2016-09-22: qty 4

## 2016-09-22 MED ORDER — MIDAZOLAM HCL 2 MG/2ML IJ SOLN
INTRAMUSCULAR | Status: AC | PRN
  Administered 2016-09-22 (×2): 1 mg via INTRAVENOUS

## 2016-09-22 NOTE — H&P (Signed)
Chief Complaint: Patient was seen in consultation today for LUL pulmonary mass at the request of Oaks,Timothy  Referring Physician(s): Oaks,Timothy   Patient Status: [facility] - Out-pt  History of Present Illness: [name] is a 71 y.o. male  Smoker who presented in July with mild hemoptysis.  CT imaging revealed a 3.5 cm LUL pulmonary mass.  Subsequent PET CT confirms increased metabolic activity concerning for malignancy.  Pt in his usual state of health today, no active complaints.   History reviewed. No pertinent past medical history.  History reviewed. No pertinent surgical history.  Allergies: Patient has no known allergies.  Medications: Prior to Admission medications   Medication Sig Start Date End Date Taking? Authorizing Provider  Cholecalciferol (VITAMIN D3) 2000 units capsule Take 1 capsule (2,000 Units total) by mouth daily. 09/08/16  Yes Plonk, Gwyndolyn Saxon, MD  cyanocobalamin 500 MCG tablet Take 500 mcg by mouth daily.   Yes [provider]  mupirocin ointment (BACTROBAN) 2 % Apply 1 application topically 2 (two) times daily. 08/31/16  Yes Frederich Cha, MD  terazosin (HYTRIN) 1 MG capsule Take 1 mg by mouth at bedtime.   Yes [provider]  chlorpheniramine-HYDROcodone (TUSSIONEX PENNKINETIC ER) 10-8 MG/5ML SUER Take 5 mLs by mouth every 12 (twelve) hours as needed. 09/07/16   Adline Potter, MD     Family History  Problem Relation Age of Onset  . Lymphoma Mother   . Diabetes Mother   . Lung cancer Sister   . Lung cancer Brother   . Lung cancer Brother     Social History   Social History  . Marital status: Single    Spouse name: N/A  . Number of children: N/A  . Years of education: N/A   Social History Main Topics  . Smoking status: Former Smoker    Packs/day: 1.00    Years: 15.00    Types: Cigarettes  . Smokeless tobacco: Never Used  . Alcohol use No  . Drug use: No  . Sexual activity: Not Asked   Other Topics Concern  . None     Social History Narrative  . None    ECOG Status: 1 - Symptomatic but completely ambulatory  Review of Systems: A 12 point ROS discussed and pertinent positives are indicated in the HPI above.  All other systems are negative.  Review of Systems  Vital Signs: BP (!) 144/87 (BP Location: Right Arm)   Pulse 67   Temp 98 F (36.7 C) (Oral)   Resp 17   SpO2 90%   Physical Exam  Mallampati Score:  MD Evaluation Airway: WNL Heart: WNL Abdomen: WNL Chest/ Lungs: WNL ASA  Classification: 1 Mallampati/Airway Score: Two  Imaging: Dg Chest 2 View  Result Date: 08/31/2016 CLINICAL DATA:  Hemoptysis EXAM: CHEST  2 VIEW COMPARISON:  August 08, 2012 FINDINGS: There is a mass in the posterior segment of the left upper lobe measuring 4.0 x 3.4 x 2.5 cm. Lungs elsewhere clear. Heart size and pulmonary vascularity are normal. No adenopathy. No evident bone lesions. IMPRESSION: Mass measuring 4.0 x 3.4 x 2.5 cm arising in the posterior segment left upper lobe. This mass is concerning for neoplasm. Advise chest CT, ideally with intravenous contrast, to further evaluate. Lungs elsewhere clear. No evident adenopathy by radiography. These results will be called to the ordering clinician or representative by the Radiologist Assistant, and communication documented in the PACS or zVision Dashboard. Electronically Signed   By: Lowella Grip III M.D.  On: 08/31/2016 17:54   Ct Chest W Contrast  Result Date: 08/31/2016 CLINICAL DATA:  71 y/o  M; abnormal chest radiograph EXAM: CT CHEST WITH CONTRAST TECHNIQUE: Multidetector CT imaging of the chest was performed during intravenous contrast administration. CONTRAST:  41mL ISOVUE-300 IOPAMIDOL (ISOVUE-300) INJECTION 61% COMPARISON:  08/31/2016 chest radiograph. FINDINGS: Cardiovascular: Normal heart size. No pericardial effusion. Moderate coronary artery calcification predominantly in LAD. Normal caliber thoracic aorta with mild calcific atherosclerosis.  Mediastinum/Nodes: Left AP window adenopathy measuring 20 x 14 mm (series 2, image 62). Lungs/Pleura: Left upper lobe lobulated pulmonary mass measuring 33 x 28 x 35 mm (AP x ML x CC series 3, image 69 and series 602, image 18). 8 mm nodule separate from the mass chest anteriorly. Right lower lobe, 6 mm, series 3 image 110. Moderate centrilobular and paraseptal emphysema with upper lobe predominance and mild fibrotic changes at the lung bases. Upper Abdomen: 17 mm well-circumscribed fluid attenuating lucency in segment 7 of the liver, probably a cyst. Left kidney upper pole 22 mm cyst. Musculoskeletal: No chest wall abnormality. No acute or significant osseous findings. IMPRESSION: 1. Left peripheral upper lobe mass measuring up to 3.5 cm, likely primary lung cancer. 2. 8 mm satellite nodule just anterior to the mass. 3. Aortopulmonary window lymphadenopathy, possibly metastatic. 4. Nonspecific 6 mm right lower lobe pulmonary nodule. 5. Moderate emphysema and mild peripheral basilar fibrosis. These results were called by telephone at the time of interpretation on 08/31/2016 at 7:33 pm to Dr. Frederich Cha , who verbally acknowledged these results. Electronically Signed   By: Kristine Garbe M.D.   On: 08/31/2016 19:35   Nm Pet Image Initial (pi) Skull Base To Thigh  Result Date: 09/07/2016 CLINICAL DATA:  Initial treatment strategy for left upper lobe pulmonary mass. EXAM: NUCLEAR MEDICINE PET SKULL BASE TO THIGH TECHNIQUE: 12.5 mCi F-18 FDG was injected intravenously. Full-ring PET imaging was performed from the skull base to thigh after the radiotracer. CT data was obtained and used for attenuation correction and anatomic localization. FASTING BLOOD GLUCOSE:  Value: 77 mg/dl COMPARISON:  Chest CT on 08/31/2016 FINDINGS: NECK No hypermetabolic lymph nodes in the neck. CHEST 3.4 cm left upper lobe mass with adjacent 7 mm satellite nodule anteriorly are hypermetabolic, with SUV max of 10.1. Mild  lymphadenopathy in the left hilum measuring approximately 2 cm is hypermetabolic, with SUV max of 9.7. Mild mediastinal lymphadenopathy in the AP window measuring up to 1.4 cm is hypermetabolic, with SUV max of 5.4. No hypermetabolic contralateral mediastinal or hilar lymphadenopathy. Mild emphysema.  No evidence of pleural effusion. ABDOMEN/PELVIS No abnormal hypermetabolic activity within the liver, pancreas, adrenal glands, or spleen. No hypermetabolic lymph nodes in the abdomen or pelvis. Small cysts in posterior dome of liver and both kidneys show absence of FDG uptake. Aortic atherosclerosis. SKELETON No focal hypermetabolic activity to suggest skeletal metastasis. IMPRESSION: Hypermetabolic 3.4 cm left upper lobe mass with adjacent sub-cm satellite nodule, consistent with primary bronchogenic carcinoma. Hypermetabolic left hilar and mediastinal AP window lymphadenopathy, consistent with metastatic disease. No evidence of distant metastatic disease. Electronically Signed   By: Earle Gell M.D.   On: 09/07/2016 08:11    Labs:  CBC:  Recent Labs  08/31/16 1818 09/22/16 0815  WBC 10.1 10.0  HGB 14.9 15.1  HCT 45.6 46.1  PLT 200 209    COAGS:  Recent Labs  09/22/16 0815  INR 1.11  APTT 31    BMP:  Recent Labs  08/31/16 1818 09/07/16 1143  NA 137 136  K 4.2 4.8  CL 106 101  CO2 27 21  GLUCOSE 98 65  BUN 13 12  CALCIUM 9.1 9.0  CREATININE 1.15 1.18  GFRNONAA >60 62  GFRAA >60 72    LIVER FUNCTION TESTS:  Recent Labs  09/07/16 1143  BILITOT 1.1  AST 24  ALT 20  ALKPHOS 57  PROT 7.5  ALBUMIN 4.0    TUMOR MARKERS: No results for input(s): AFPTM, CEA, CA199, CHROMGRNA in the last 8760 hours.  Assessment and Plan:  Left upper lobe pulmonary nodule suspicious for bronchogenic carcinoma.   1.) CT guided biopsy  Thank you for this interesting consult.  I greatly enjoyed meeting WESTIN KNOTTS and look forward to participating in their care.  A copy of this  report was sent to the requesting provider on this date.  Electronically Signed: Jacqulynn Cadet, MD 09/22/2016, 9:32 AM   I spent a total of  15 Minutes   in face to face in clinical consultation, greater than 50% of which was counseling/coordinating care for left upper lobe pulmonary nodule.

## 2016-09-22 NOTE — Procedures (Signed)
Interventional Radiology Procedure Note  Procedure: CT guided biopsy of LUL mass Complications: No immediate Recommendations: - Bedrest until CXR cleared.  Minimize talking, coughing or otherwise straining.  - Follow up 2 hr CXR pending   Signed,  Criselda Peaches, MD

## 2016-09-22 NOTE — OR Nursing (Signed)
Dr Laurence Ferrari aware of peaked t- wave, and potassium 7/25 was 4.8. No new orders

## 2016-09-22 NOTE — Discharge Instructions (Signed)
Needle Biopsy of the Lung, Care After °This sheet gives you information about how to care for yourself after your procedure. Your health care provider may also give you more specific instructions. If you have problems or questions, contact your health care provider. °What can I expect after the procedure? °After the procedure, it is common to have: °· Soreness, pain, and tenderness where a tissue sample was taken (biopsy site). °· A cough. °· A sore throat. ° °Follow these instructions at home: °Biopsy site care °· Follow instructions from your health care provider about when to remove the bandage that was placed on the biopsy site. °· Keep the bandage dry until it has been removed. °· Check your biopsy site every day for signs of infection. Check for: °? More redness, swelling, or pain. °? More fluid or blood. °? Warmth to the touch. °? Pus or a bad smell. °General instructions °· Rest as directed by your health care provider. Ask your health care provider what activities are safe for you. °· Do not take baths, swim, or use a hot tub until your health care provider approves. °· Take over-the-counter and prescription medicines only as told by your health care provider. °· If you have airplane travel scheduled, talk with your health care provider about when it is safe for you to travel by airplane. °· It is up to you to get the results of your procedure. Ask your health care provider, or the department that is doing the procedure, when your results will be ready. °· Keep all follow-up visits as told by your health care provider. This is important. °Contact a health care provider if: °· You have more redness, swelling, or pain around your biopsy site. °· You have more fluid or blood coming from your biopsy site. °· Your biopsy site feels warm to the touch. °· You have pus or a bad smell coming from your biopsy site. °· You have a fever. °· You have pain that does not get better with medicine. °Get help right away  if: °· You have problems breathing. °· You have chest pain. °· You cough up blood. °· You faint. °· You have a fast heart rate. °Summary °· After a needle biopsy of the lung, it is common to have a cough, a sore throat, or soreness, pain, and tenderness where a tissue sample was taken (biopsy site). °· You should check your biopsy area every day for signs of infection, including pus or a bad smell, warmth, more fluid or blood, or more redness, swelling, or pain. °· You should not take baths, swim, or use a hot tub until your health care provider approves. °· It is up to you to get the results of your procedure. Ask your health care provider, or the department that is doing the procedure, when your results will be ready. °This information is not intended to replace advice given to you by your health care provider. Make sure you discuss any questions you have with your health care provider. °Document Released: 11/28/2006 Document Revised: 12/23/2015 Document Reviewed: 12/23/2015 °Elsevier Interactive Patient Education © 2017 [employer] ° ° °

## 2016-09-23 ENCOUNTER — Other Ambulatory Visit: Payer: Self-pay | Admitting: Pathology

## 2016-09-23 LAB — SURGICAL PATHOLOGY

## 2016-09-26 ENCOUNTER — Telehealth: Payer: Self-pay

## 2016-09-26 NOTE — Telephone Encounter (Signed)
Discussed patient's CT guided biopsy results with Dr. Genevive Bi at this time. He will call patient to review results.

## 2016-09-28 ENCOUNTER — Ambulatory Visit
Admission: RE | Admit: 2016-09-28 | Discharge: 2016-09-28 | Disposition: A | Discharge status: Home / Self Care | Payer: Medicare HMO | Source: Ambulatory Visit | Attending: Radiation Oncology | Admitting: Radiation Oncology

## 2016-09-28 ENCOUNTER — Encounter: Payer: Self-pay | Admitting: Oncology

## 2016-09-28 ENCOUNTER — Inpatient Hospital Stay: Payer: Medicare HMO | Attending: Oncology | Admitting: Oncology

## 2016-09-28 ENCOUNTER — Encounter: Payer: Self-pay | Admitting: *Deleted

## 2016-09-28 ENCOUNTER — Inpatient Hospital Stay: Payer: Medicare HMO

## 2016-09-28 VITALS — BP 149/78 | HR 83 | Temp 95.8°F | Ht 72.0 in | Wt 264.0 lb

## 2016-09-28 DIAGNOSIS — C3412 Malignant neoplasm of upper lobe, left bronchus or lung: Secondary | ICD-10-CM | POA: Insufficient documentation

## 2016-09-28 DIAGNOSIS — Z801 Family history of malignant neoplasm of trachea, bronchus and lung: Secondary | ICD-10-CM | POA: Insufficient documentation

## 2016-09-28 DIAGNOSIS — Z807 Family history of other malignant neoplasms of lymphoid, hematopoietic and related tissues: Secondary | ICD-10-CM | POA: Insufficient documentation

## 2016-09-28 DIAGNOSIS — Z79899 Other long term (current) drug therapy: Secondary | ICD-10-CM | POA: Diagnosis not present

## 2016-09-28 DIAGNOSIS — R042 Hemoptysis: Secondary | ICD-10-CM | POA: Insufficient documentation

## 2016-09-28 DIAGNOSIS — R590 Localized enlarged lymph nodes: Secondary | ICD-10-CM | POA: Insufficient documentation

## 2016-09-28 DIAGNOSIS — Z510 Encounter for antineoplastic radiation therapy: Secondary | ICD-10-CM | POA: Diagnosis not present

## 2016-09-28 DIAGNOSIS — Z87891 Personal history of nicotine dependence: Secondary | ICD-10-CM | POA: Diagnosis not present

## 2016-09-28 DIAGNOSIS — R918 Other nonspecific abnormal finding of lung field: Secondary | ICD-10-CM | POA: Insufficient documentation

## 2016-09-28 DIAGNOSIS — J439 Emphysema, unspecified: Secondary | ICD-10-CM | POA: Insufficient documentation

## 2016-09-28 LAB — COMPREHENSIVE METABOLIC PANEL
ALT: 19 U/L (ref 17–63)
AST: 21 U/L (ref 15–41)
Albumin: 3.5 g/dL (ref 3.5–5.0)
Alkaline Phosphatase: 50 U/L (ref 38–126)
Anion gap: 4 — ABNORMAL LOW (ref 5–15)
BUN: 13 mg/dL (ref 6–20)
CHLORIDE: 106 mmol/L (ref 101–111)
CO2: 27 mmol/L (ref 22–32)
CREATININE: 1.27 mg/dL — AB (ref 0.61–1.24)
Calcium: 8.7 mg/dL — ABNORMAL LOW (ref 8.9–10.3)
GFR calc Af Amer: >60 mL/min (ref >60)
GFR, EST NON AFRICAN AMERICAN: 56 mL/min — AB (ref >60)
GLUCOSE: 77 mg/dL (ref 65–99)
Potassium: 4.2 mmol/L (ref 3.5–5.1)
Sodium: 137 mmol/L (ref 135–145)
Total Bilirubin: 1.2 mg/dL (ref 0.3–1.2)
Total Protein: 7.3 g/dL (ref 6.5–8.1)

## 2016-09-28 LAB — CBC WITH DIFFERENTIAL/PLATELET
BASOS ABS: 0 10*3/uL (ref 0–0.1)
Basophils Relative: 0 %
EOS PCT: 1 %
Eosinophils Absolute: 0.1 10*3/uL (ref 0–0.7)
HCT: 44.6 % (ref 40.0–52.0)
Hemoglobin: 14.9 g/dL (ref 13.0–18.0)
LYMPHS PCT: 17 %
Lymphs Abs: 1.3 10*3/uL (ref 1.0–3.6)
MCH: 28.7 pg (ref 26.0–34.0)
MCHC: 33.4 g/dL (ref 32.0–36.0)
MCV: 86 fL (ref 80.0–100.0)
MONO ABS: 0.6 10*3/uL (ref 0.2–1.0)
Monocytes Relative: 8 %
Neutro Abs: 5.7 10*3/uL (ref 1.4–6.5)
Neutrophils Relative %: 74 %
PLATELETS: 209 10*3/uL (ref 150–440)
RBC: 5.19 MIL/uL (ref 4.40–5.90)
RDW: 14.3 % (ref 11.5–14.5)
WBC: 7.8 10*3/uL (ref 3.8–10.6)

## 2016-09-28 NOTE — Progress Notes (Signed)
  Oncology Nurse Navigator Documentation  Navigator Location: [facility] (09/28/16 1100)   )Navigator Encounter Type: Initial MedOnc;Initial RadOnc (09/28/16 1100)     Confirmed Diagnosis Date: 09/23/16 (09/28/16 1100)                 Treatment Phase: Pre-Tx/Tx Discussion (09/28/16 1100) Barriers/Navigation Needs: Coordination of Care (09/28/16 1100)   Interventions: Referrals;Coordination of Care (09/28/16 1100)   Coordination of Care: Appts;Radiology;Chemo (09/28/16 1100)         met with patient during initial consultation with Dr. Tasia Catchings and Dr. Baruch Gouty. All questions answered at the time of visit. Pt and family given education regarding diagnosis. Info given on support services available. Upcoming appts reviewed with pt and family. Assisted in coordinating appts with pt's work schedule. Informed pt and family that will be notified by phone when chemo is scheduled to start depending on when radiation starts and when port is placed. Contact info given and encouraged to call with any further questions or concerns. Pt and family verbalized understanding.          Time Spent with Patient: 120 (09/28/16 1100)

## 2016-09-28 NOTE — Progress Notes (Addendum)
[facility] Initial Visit:  Patient Care Team: Adline Potter, MD as PCP - General (Family Medicine)  CHIEF COMPLAINTS/PURPOSE OF CONSULTATION: Here for newly diagnosed lung cancer.  HISTORY OF PRESENTING ILLNESS: [name] 71 y.o. African American male previous healthy, former smoker who is referred to me for evaluation and management of newly diagnosed lung cancer. He gets his usual care at [facility]. He presented last month to urgent care due to coughing and hemoptysis. X ray showed left lung mass. CT showed LUL mass with a satellite lesion as well as mediastinal adenopathy.  PET was done which showed hypermetabolic left upper lobe mass as well as the adjacent nodule, ipsilateral hilar and mediastinal lymphadenopathy. CT guided left lung mass biopsy showed squamous lung carcinoma.  Patient is accompanied by his wife and daughter to clinic for discussion about treatments.  He reports feeling well except still cough up small amount of blood mixed in the sputum. Denies SOB, chest pain, back pain, abdominal pain. Denies headache, vision changes. He works a Administrator.   Review of Systems  Constitutional: Negative.   HENT:  Negative.   Eyes: Negative.   Respiratory: Positive for cough and hemoptysis.   Cardiovascular: Negative.   Gastrointestinal: Negative.   Endocrine: Negative.   Genitourinary: Negative.    Musculoskeletal: Negative.   Skin: Negative.   Neurological: Negative.   Hematological: Negative.   Psychiatric/Behavioral: Negative.     MEDICAL HISTORY: History reviewed. No pertinent past medical history.  SURGICAL HISTORY: History reviewed. No pertinent surgical history.  SOCIAL HISTORY: Social History   Social History  . Marital status: Single    Spouse name: N/A  . Number of children: N/A  . Years of education: N/A   Occupational History  . Not on file.   Social History Main Topics  . Smoking status: Former Smoker    Packs/day: 1.00   Years: 15.00    Types: Cigarettes    Quit date: 09/28/1992  . Smokeless tobacco: Never Used  . Alcohol use No  . Drug use: No  . Sexual activity: Not on file   Other Topics Concern  . Not on file   Social History Narrative  . No narrative on file    FAMILY HISTORY Family History  Problem Relation Age of Onset  . Lymphoma Mother   . Diabetes Mother   . Lung cancer Sister   . Lung cancer Brother   . Lung cancer Brother     ALLERGIES:  has No Known Allergies.  MEDICATIONS:  Current Outpatient Prescriptions  Medication Sig Dispense Refill  . chlorpheniramine-HYDROcodone (TUSSIONEX PENNKINETIC ER) 10-8 MG/5ML SUER Take 5 mLs by mouth every 12 (twelve) hours as needed. 115 mL 0  . Cholecalciferol (VITAMIN D3) 2000 units capsule Take 1 capsule (2,000 Units total) by mouth daily.    . cyanocobalamin 500 MCG tablet Take 500 mcg by mouth daily.    . mupirocin ointment (BACTROBAN) 2 % Apply 1 application topically 2 (two) times daily. 22 g 0  . Red Yeast Rice Extract (RED YEAST RICE PO) Take by mouth.    . terazosin (HYTRIN) 1 MG capsule Take 1 mg by mouth at bedtime.     No current facility-administered medications for this visit.     PHYSICAL EXAMINATION:  ECOG PERFORMANCE STATUS: 0 - Asymptomatic   There were no vitals filed for this visit.  There were no vitals filed for this visit.   Physical Exam GENERAL: No distress, well nourished.  SKIN:  No rashes or significant lesions  HEAD: Normocephalic, No masses, lesions, tenderness or abnormalities  EYES: Conjunctiva are pink, non icteric ENT: External ears normal ,lips , buccal mucosa, and tongue normal and mucous membranes are moist  LYMPH: No palpable cervical and axillary lymphadenopathy  LUNGS: Clear to auscultation, no crackles or wheezes HEART: Regular rate & rhythm, no murmurs, no gallops, S1 normal and S2 normal  ABDOMEN: Abdomen soft, non-tender, normal bowel sounds, I did not appreciate any  masses or  organomegaly  MUSCULOSKELETAL: No CVA tenderness and no tenderness on percussion of the back or rib cage.  EXTREMITIES: No edema, no skin discoloration or tenderness NEURO: Alert & oriented, no focal motor/sensory deficits.   LABORATORY DATA: I have personally reviewed the data as listed: CBC    Component Value Date/Time   WBC 7.8 09/28/2016 0959   RBC 5.19 09/28/2016 0959   HGB 14.9 09/28/2016 0959   HCT 44.6 09/28/2016 0959   PLT 209 09/28/2016 0959   MCV 86.0 09/28/2016 0959   MCH 28.7 09/28/2016 0959   MCHC 33.4 09/28/2016 0959   RDW 14.3 09/28/2016 0959   LYMPHSABS 1.3 09/28/2016 0959   MONOABS 0.6 09/28/2016 0959   EOSABS 0.1 09/28/2016 0959   BASOSABS 0.0 09/28/2016 0959    CMP Latest Ref Rng & Units 09/28/2016 09/07/2016 08/31/2016  Glucose 65 - 99 mg/dL 77 65 98  BUN 6 - 20 mg/dL 13 12 13   Creatinine 0.61 - 1.24 mg/dL 1.27(H) 1.18 1.15  Sodium 135 - 145 mmol/L 137 136 137  Potassium 3.5 - 5.1 mmol/L 4.2 4.8 4.2  Chloride 101 - 111 mmol/L 106 101 106  CO2 22 - 32 mmol/L 27 21 27   Calcium 8.9 - 10.3 mg/dL 8.7(L) 9.0 9.1  Total Protein 6.5 - 8.1 g/dL 7.3 7.5 -  Total Bilirubin 0.3 - 1.2 mg/dL 1.2 1.1 -  Alkaline Phos 38 - 126 U/L 50 57 -  AST 15 - 41 U/L 21 24 -  ALT 17 - 63 U/L 19 20 -    RADIOGRAPHIC STUDIES: I have personally reviewed the radiological images as listed and agree with the findings in the report CT chest w contrast 08/31/2016 IMPRESSION: 1. Left peripheral upper lobe mass measuring up to 3.5 cm, likely primary lung cancer. 2. 8 mm satellite nodule just anterior to the mass. 3. Aortopulmonary window lymphadenopathy, possibly metastatic. 4. Nonspecific 6 mm right lower lobe pulmonary nodule. 5. Moderate emphysema and mild peripheral basilar fibrosis  PET scan 09/06/2016 IMPRESSION: Hypermetabolic 3.4 cm left upper lobe mass with adjacent sub-cm satellite nodule, consistent with primary bronchogenic carcinoma. Hypermetabolic left hilar and  mediastinal AP window lymphadenopathy, consistent with metastatic disease.No evidence of distant metastatic disease.  Pathology 09/22/2016 Surgical Pathology  CASE: [id number]  PATIENT: [name]  Surgical Pathology Report      SPECIMEN SUBMITTED:  A. Lung, LUL pulmonary mass  DIAGNOSIS:  A. LUNG MASS, LEFT UPPER LOBE; CT GUIDED BIOPSY:  - SQUAMOUS CELL CARCINOMA.   ASSESSMENT/PLAN Cancer Staging Lung cancer [facility]) Staging form: Lung, AJCC 8th Edition - Clinical stage from 09/27/2016: Stage IIIA (cT2a, cN2, cM0) - Signed by Earlie Server, MD on 09/29/2016  1. Malignant neoplasm of upper lobe of left lung ([place])   2. Mediastinal adenopathy   . Image results and pathology were discussed with patient and his family members. We talked about cancer histology, extent of the cancer and prognosis. He has Stage IIIB lung cancer and I would recommend concurrent chemotherapy with RT followed by  consolidation with chemotherapy and maintenance with immunotherapy.  Regimen for chemotherapy will be Carbo AUC 2 and Taxol 45mg /m2 weekly with RT, followed by 2 cycles of carboplatin AUC = 6, paclitaxel 200 mg/m2, and followed by immunotherapy. I explained to the patient the risks and benefits of chemotherapy including all but not limited to hair loss, mouth sore, nausea, vomiting, low blood counts, bleeding, and risk of life threatening infection and even death, secondary malignancy. Risk of neuropathy is associated with Taxol. Patient understands and agrees to proceed as planned.  Patient will be signed up to go to chemotherapy class. # Antiemetics-Zofran and Compazine; EMLA cream  He will see Dr.Chrystal today as well.   Plan MRI brain to rule out CNS involvement.  Baseline CBC and CMP Appointment with Dr.Oaks for medi port placement.  Chemotherapy class.  F/u to be determined. I will see him on Day 1 of his cycle 1 concurrent chemotherapy.   Addendum: patient's case was discussed at tumor board  09/29/2016. The adjacent 45mm nodule not considered to be a separate nodule, therefore he is cT2a.   Orders Placed This Encounter  Procedures  . MR Brain W Wo Contrast    Standing Status:   Future    Standing Expiration Date:   09/28/2017    Order Specific Question:   If indicated for the ordered procedure, I authorize the administration of contrast media per Radiology protocol    Answer:   Yes    Order Specific Question:   What is the patient's sedation requirement?    Answer:   No Sedation    Order Specific Question:   Does the patient have a pacemaker or implanted devices?    Answer:   No    Order Specific Question:   Radiology Contrast Protocol - do NOT remove file path    Answer:   [email or web address]    Order Specific Question:   Preferred imaging location?    Answer:   [facility] (table limit-300lbs)  . CBC with Differential/Platelet    Standing Status:   Future    Number of Occurrences:   1    Standing Expiration Date:   09/28/2017  . Comprehensive metabolic panel    Standing Status:   Future    Number of Occurrences:   1    Standing Expiration Date:   09/28/2017    All questions were answered. The patient knows to call the clinic with any problems, questions or concerns. Thank you for this kind referral and the opportunity to participate in the care of this patient. A copy of today's note is routed to referring provider Dr.Oaks   Earlie Server, MD  09/28/2016 11:05 AM

## 2016-09-28 NOTE — Progress Notes (Signed)
START ON PATHWAY REGIMEN - Non-Small Cell Lung     Administer weekly:     Paclitaxel      Carboplatin   **Always confirm dose/schedule in your pharmacy ordering system**    Patient Characteristics: Stage III - Unresectable, PS = 0, 1 AJCC T Category: T3 Current Disease Status: No Distant Mets or Local Recurrence AJCC N Category: N2 AJCC M Category: M0 AJCC 8 Stage Grouping: IIIB Performance Status: PS = 0, 1 Intent of Therapy: Curative Intent, Discussed with Patient

## 2016-09-28 NOTE — Consult Note (Signed)
NEW PATIENT EVALUATION  Name: [name]  MRN: [id number]  Date:   09/28/2016     DOB: 08/16/1945   This 71 y.o. 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   DIAGNOSIS: The encounter diagnosis was Malignant neoplasm of upper lobe of left lung ([place]).   PREVIOUS INVESTIGATIONS:  PET CT and CT scans reviewed MRI of brain scheduled Pathology report reviewed Clinical notes reviewed  HPI: patient is a 71 year old male with approximately 30 pack year smoking history quit smoking 25 years ago. He had an episode of increasing cough with hemoptysis. Chest x-ray on workup revealed a left upper lobe mass with CT scan confirming a 3-4 cm mass in the left upper lobe. There was mediastinal adenopathy. This was all confirmed on a PET CT scan showing hypermetabolic activityand a 3.4 cm left upper lobe mass with adjacent sub-centimeter satellite nodule consistent with primary bronchogenic carcinoma. There is also left hilar and mediastinal AP window hypermetabolic activity. No evidence of distant metastatic disease. He is scheduled for an MRI of his brain to complete his staging workup.. Based on the imediastinal involvement he has been declined for surgery by thoracic oncology. He's been seen by medical oncology and is now referred to radiation oncology for consideration of treatment. He is doing well still having some infrequent episodes of trace hemoptysis. He's having no dysphagia at this time. CT-guided biopsy of the mass was positive for squamous cell carcinoma.  PLANNED TREATMENT REGIMEN: concurrent chemoradiation  PAST MEDICAL HISTORY:  has no past medical history on file.    PAST SURGICAL HISTORY: No past surgical history on file.  FAMILY HISTORY: family history includes Diabetes in his mother; Lung  cancer in his brother, brother, and sister; Lymphoma in his mother.  SOCIAL HISTORY:  reports that he quit smoking about 24 years ago. His smoking use included Cigarettes. He has a 15.00 pack-year smoking history. He has never used smokeless tobacco. He reports that he does not drink alcohol or use drugs.  ALLERGIES: Patient has no known allergies.  MEDICATIONS:  Current Outpatient Prescriptions  Medication Sig Dispense Refill  . chlorpheniramine-HYDROcodone (TUSSIONEX PENNKINETIC ER) 10-8 MG/5ML SUER Take 5 mLs by mouth every 12 (twelve) hours as needed. 115 mL 0  . Cholecalciferol (VITAMIN D3) 2000 units capsule Take 1 capsule (2,000 Units total) by mouth daily.    . cyanocobalamin 500 MCG tablet Take 500 mcg by mouth daily.    . mupirocin ointment (BACTROBAN) 2 % Apply 1 application topically 2 (two) times daily. 22 g 0  . Red Yeast Rice Extract (RED YEAST RICE PO) Take by mouth.    . terazosin (HYTRIN) 1 MG capsule Take 1 mg by mouth at bedtime.     No current facility-administered medications for this encounter.     ECOG PERFORMANCE STATUS:  1 - Symptomatic but completely ambulatory  REVIEW OF SYSTEMS: except for the cough and trace hemoptysis Patient denies any weight loss, fatigue, weakness, fever, chills or night sweats. Patient denies any loss of vision, blurred vision. Patient denies any ringing  of the ears or hearing loss. No irregular heartbeat. Patient denies heart murmur or history of fainting. Patient denies any chest pain or pain radiating to her upper extremities. Patient denies any shortness of breath, difficulty breathing at night, cough or hemoptysis. Patient denies  any swelling in the lower legs. Patient denies any nausea vomiting, vomiting of blood, or coffee ground material in the vomitus. Patient denies any stomach pain. Patient states has had normal bowel movements no significant constipation or diarrhea. Patient denies any dysuria, hematuria or significant nocturia.  Patient denies any problems walking, swelling in the joints or loss of balance. Patient denies any skin changes, loss of hair or loss of weight. Patient denies any excessive worrying or anxiety or significant depression. Patient denies any problems with insomnia. Patient denies excessive thirst, polyuria, polydipsia. Patient denies any swollen glands, patient denies easy bruising or easy bleeding. Patient denies any recent infections, allergies or URI. Patient "s visual fields have not changed significantly in recent time.    PHYSICAL EXAM: There were no vitals taken for this visit. Well-developed slightly obese male in NAD. Well-developed well-nourished patient in NAD. HEENT reveals PERLA, EOMI, discs not visualized.  Oral cavity is clear. No oral mucosal lesions are identified. Neck is clear without evidence of cervical or supraclavicular adenopathy. Lungs are clear to A&P. Cardiac examination is essentially unremarkable with regular rate and rhythm without murmur rub or thrill. Abdomen is benign with no organomegaly or masses noted. Motor sensory and DTR levels are equal and symmetric in the upper and lower extremities. Cranial nerves II through XII are grossly intact. Proprioception is intact. No peripheral adenopathy or edema is identified. No motor or sensory levels are noted. Crude visual fields are within normal range.  LABORATORY DATA: pathology reports reviewed    RADIOLOGY RESULTS:CT and PET/CT scans reviewed   IMPRESSION: stage IIIa squamous cell carcinoma of the left upper lobe  PLAN: at this time I concur with going ahead with concurrent chemoradiation. Would plan on delivering 6600 cGy using I MRT radiation therapy. I would choose I M RT radiation therapy to spare critical structures such as his heart esophagus normal lung volumes spinal cord.we will coordinate his treatments with medical oncology. There will be extra effort by both professional staff as well as technical staff to  coordinate and manage concurrent chemoradiation and ensuing side effects during his treatments.risks and benefits of treatment including increasing cough fatigue possible radiation esophagitis skin reaction alteration of blood counts all were discussed in detail with the patient and his family. They all comprehend my treatment plan well. I have personally set up and ordered CT simulation for early next week.  I would like to take this opportunity to thank you for allowing me to participate in the care of your patient.Armstead Peaks., MD

## 2016-09-29 ENCOUNTER — Ambulatory Visit (INDEPENDENT_AMBULATORY_CARE_PROVIDER_SITE_OTHER): Payer: Medicare HMO | Admitting: Cardiothoracic Surgery

## 2016-09-29 ENCOUNTER — Encounter: Payer: Self-pay | Admitting: Cardiothoracic Surgery

## 2016-09-29 ENCOUNTER — Other Ambulatory Visit (HOSPITAL_BASED_OUTPATIENT_CLINIC_OR_DEPARTMENT_OTHER): Payer: Medicare HMO

## 2016-09-29 DIAGNOSIS — C3412 Malignant neoplasm of upper lobe, left bronchus or lung: Secondary | ICD-10-CM | POA: Diagnosis not present

## 2016-09-29 NOTE — Patient Instructions (Signed)
We have your surgery scheduled for 10/05/16 at [facility] with [facility]. Please see the blue pre-care sheet for surgery information. Please call if you have any questions or concerns.

## 2016-09-29 NOTE — Patient Instructions (Signed)

## 2016-09-30 ENCOUNTER — Telehealth: Payer: Self-pay | Admitting: General Surgery

## 2016-09-30 ENCOUNTER — Telehealth: Payer: Self-pay

## 2016-09-30 ENCOUNTER — Other Ambulatory Visit: Payer: Self-pay

## 2016-09-30 NOTE — Progress Notes (Signed)
  Patient ID: [name], male   DOB: Jul 07, 1945, 71 y.o.   MRN: [id number]  HISTORY: [name] returns today in follow-up. He has seen oncology and is scheduled to see radiation therapy next week. He has been given his diagnosis of metastatic squamous cell carcinoma requiring concurrent radiation therapy and chemotherapy for his left upper lobe squamous cell carcinoma with mediastinal metastases. He is seen today at the request of the oncologist for insertion of Port-A-Cath.   EXAM:    Resp: Lungs are clear bilaterally.  No respiratory distress, normal effort. Heart:  Regular without murmurs Abd:  Abdomen is soft, non distended and non tender. No masses are palpable.  There is no rebound and no guarding.  Neurological: Alert and oriented to person, place, and time. Coordination normal.  Skin: Skin is warm and dry. No rash noted. No diaphoretic. No erythema. No pallor.  Psychiatric: Normal mood and affect. Normal behavior. Judgment and thought content normal.    ASSESSMENT: Metastatic squamous cell carcinoma the left upper lobe   PLAN:   I had a long discussion with the patient and his family today. They understand that he has unresectable lung cancer and requires concurrent radiation therapy and chemotherapy. He is a good candidate for Port-A-Cath insertion. I reviewed with him the indications and risks including the risks of bleeding, infection, pneumothorax and death. All his questions were answered. He understands that Dr. [name] will be performing the surgery next week as I will be out of the institution at that time.    Ryan Lewandowsky, MD

## 2016-09-30 NOTE — Progress Notes (Signed)
HISTORY: [name] returns today in follow-up. He has seen oncology and is scheduled to see radiation therapy next week. He has been given his diagnosis of metastatic squamous cell carcinoma requiring concurrent radiation therapy and chemotherapy for his left upper lobe squamous cell carcinoma with mediastinal metastases. He is seen today at the request of the oncologist for insertion of Port-A-Cath.   EXAM:    Resp:   Lungs are clear bilaterally.  No respiratory distress, normal effort. Heart:  Regular without murmurs Abd:  Abdomen is soft, non distended and non tender. No masses are palpable.  There is no rebound and no guarding.  Neurological: Alert and oriented to person, place, and time. Coordination normal.  Skin:    Skin is warm and dry. No rash noted. No diaphoretic. No erythema. No pallor.  Psychiatric: Normal mood and affect. Normal behavior. Judgment and thought content normal.    ASSESSMENT: Metastatic squamous cell carcinoma the left upper lobe   PLAN:   I had a long discussion with the patient and his family today. They understand that he has unresectable lung cancer and requires concurrent radiation therapy and chemotherapy. He is a good candidate for Port-A-Cath insertion. I reviewed with him the indications and risks including the risks of bleeding, infection, pneumothorax and death. All his questions were answered. He understands that Dr. [name] will be performing the surgery next week as I will be out of the institution at that time.

## 2016-09-30 NOTE — Telephone Encounter (Signed)
Call made to patient at this time to inform him that he will need to stop Aspirin products today and hold these medications until port placement on 10/05/16. No answer. Left voicemail with all information on patient's answering machine and also asking for return phone call indicating that this message was received.

## 2016-09-30 NOTE — Telephone Encounter (Signed)
Pt advised of pre op date/time and sx date. Sx: 10/05/16 with Dr Forrestine Him Placement.  Pre op: 10/03/16 between 1-5:00pm--Phone.   Patient made aware to call [phone], between 1-3:00pm the day before surgery, to find out what time to arrive.

## 2016-10-03 ENCOUNTER — Encounter
Admission: RE | Admit: 2016-10-03 | Discharge: 2016-10-03 | Disposition: A | Discharge status: Home / Self Care | Payer: Medicare HMO | Source: Ambulatory Visit | Attending: General Surgery | Admitting: General Surgery

## 2016-10-03 HISTORY — DX: Benign prostatic hyperplasia without lower urinary tract symptoms: N40.0

## 2016-10-03 NOTE — Patient Instructions (Signed)
Your procedure is scheduled on: Wednesday, October 05, 2016 Report to Same Day Surgery on the 2nd floor in the [facility]. To find out your arrival time, please call [phone] between 1PM - 3PM on: Tuesday, August 21st  REMEMBER: Instructions that are not followed completely may result in serious medical risk up to and including death; or upon the discretion of your surgeon and anesthesiologist your surgery may need to be rescheduled.  Do not eat food or drink liquids after midnight. No gum chewing or hard candies  No Alcohol for 24 hours before or after surgery.  No Smoking for 24 hours prior to surgery.  Notify your doctor if there is any change in your medical condition (cold, fever, infection).  Do not wear jewelry, make-up, hairpins, clips or nail polish.  Do not wear lotions, powders, or perfumes.   Do not shave 48 hours prior to surgery. Men may shave face and neck.  Contacts and dentures may not be worn into surgery.  Do not bring valuables to the hospital. [facility] is not responsible for any belongings or valuables.   TAKE THESE MEDICATIONS THE MORNING OF SURGERY WITH A SIP OF WATER:  NONE  Use CHG Soap or wipes as directed on instruction sheet.  Follow recommendations from Cardiologist, Pulmonologist or PCP regarding stopping Aspirin.  NOW!!  Stop BAYER BACK AND BODY.Marland KitchenAND Anti-inflammatories such as Advil, Aleve, Ibuprofen, Motrin, Naproxen, Naprosyn, Goodie powder, or aspirin products. (May take Tylenol or Acetaminophen if needed.)  NOW!! Stop supplements (RED YEAST RICE) until after surgery. (May continue Vitamin D, Vitamin B.)  If you are being discharged the day of surgery, you will not be allowed to drive home. You will need someone to drive you home and stay with you that night.   If you are taking public transportation, you will need to have a responsible adult to with you.

## 2016-10-04 ENCOUNTER — Inpatient Hospital Stay: Payer: Medicare HMO

## 2016-10-04 ENCOUNTER — Ambulatory Visit
Admission: RE | Admit: 2016-10-04 | Discharge: 2016-10-04 | Disposition: A | Discharge status: Home / Self Care | Payer: Medicare HMO | Source: Ambulatory Visit | Attending: Radiation Oncology | Admitting: Radiation Oncology

## 2016-10-04 ENCOUNTER — Encounter: Payer: Self-pay | Admitting: Interventional Radiology

## 2016-10-04 ENCOUNTER — Ambulatory Visit: Payer: Medicare HMO

## 2016-10-04 ENCOUNTER — Encounter
Admission: RE | Admit: 2016-10-04 | Discharge: 2016-10-04 | Disposition: A | Discharge status: Home / Self Care | Payer: Medicare HMO | Source: Ambulatory Visit | Attending: General Surgery | Admitting: General Surgery

## 2016-10-04 DIAGNOSIS — C3490 Malignant neoplasm of unspecified part of unspecified bronchus or lung: Secondary | ICD-10-CM | POA: Diagnosis present

## 2016-10-04 DIAGNOSIS — C781 Secondary malignant neoplasm of mediastinum: Secondary | ICD-10-CM | POA: Diagnosis not present

## 2016-10-04 DIAGNOSIS — Z7982 Long term (current) use of aspirin: Secondary | ICD-10-CM | POA: Diagnosis not present

## 2016-10-04 DIAGNOSIS — E669 Obesity, unspecified: Secondary | ICD-10-CM | POA: Diagnosis not present

## 2016-10-04 DIAGNOSIS — C3412 Malignant neoplasm of upper lobe, left bronchus or lung: Secondary | ICD-10-CM | POA: Diagnosis not present

## 2016-10-04 DIAGNOSIS — Z79899 Other long term (current) drug therapy: Secondary | ICD-10-CM | POA: Diagnosis not present

## 2016-10-04 DIAGNOSIS — Z510 Encounter for antineoplastic radiation therapy: Secondary | ICD-10-CM | POA: Diagnosis not present

## 2016-10-04 MED ORDER — CEFAZOLIN SODIUM-DEXTROSE 2-4 GM/100ML-% IV SOLN
2.0000 g | INTRAVENOUS | Status: AC
  Administered 2016-10-05: 2 g via INTRAVENOUS

## 2016-10-04 NOTE — Pre-Procedure Instructions (Signed)
EKG DISCUSSED WITH DR Assunta Gambles AND TO PROCEED

## 2016-10-05 ENCOUNTER — Ambulatory Visit: Payer: Medicare HMO | Admitting: Anesthesiology

## 2016-10-05 ENCOUNTER — Ambulatory Visit
Admission: RE | Admit: 2016-10-05 | Discharge: 2016-10-05 | Disposition: A | Discharge status: Home / Self Care | Payer: Medicare HMO | Source: Ambulatory Visit | Attending: General Surgery | Admitting: General Surgery

## 2016-10-05 ENCOUNTER — Ambulatory Visit: Payer: Medicare HMO

## 2016-10-05 ENCOUNTER — Encounter
Admission: RE | Disposition: A | Discharge status: Home / Self Care | Payer: Self-pay | Source: Ambulatory Visit | Attending: General Surgery

## 2016-10-05 DIAGNOSIS — E669 Obesity, unspecified: Secondary | ICD-10-CM | POA: Insufficient documentation

## 2016-10-05 DIAGNOSIS — C781 Secondary malignant neoplasm of mediastinum: Secondary | ICD-10-CM | POA: Insufficient documentation

## 2016-10-05 DIAGNOSIS — Z7982 Long term (current) use of aspirin: Secondary | ICD-10-CM | POA: Insufficient documentation

## 2016-10-05 DIAGNOSIS — Z79899 Other long term (current) drug therapy: Secondary | ICD-10-CM | POA: Insufficient documentation

## 2016-10-05 DIAGNOSIS — C3412 Malignant neoplasm of upper lobe, left bronchus or lung: Secondary | ICD-10-CM | POA: Insufficient documentation

## 2016-10-05 DIAGNOSIS — Z95828 Presence of other vascular implants and grafts: Secondary | ICD-10-CM

## 2016-10-05 HISTORY — PX: PORTACATH PLACEMENT: SHX2246

## 2016-10-05 SURGERY — INSERTION, TUNNELED CENTRAL VENOUS DEVICE, WITH PORT
Anesthesia: General | Laterality: Right | Wound class: Clean

## 2016-10-05 MED ORDER — LACTATED RINGERS IV SOLN
INTRAVENOUS | Status: DC
  Administered 2016-10-05: 13:00:00 via INTRAVENOUS

## 2016-10-05 MED ORDER — BUPIVACAINE-EPINEPHRINE (PF) 0.25% -1:200000 IJ SOLN
INTRAMUSCULAR | Status: DC | PRN
  Administered 2016-10-05: 10 mL via PERINEURAL

## 2016-10-05 MED ORDER — CHLORHEXIDINE GLUCONATE CLOTH 2 % EX PADS: 6.0000 | MEDICATED_PAD | Freq: Once | CUTANEOUS | Status: DC

## 2016-10-05 MED ORDER — FENTANYL CITRATE (PF) 100 MCG/2ML IJ SOLN: 25.0000 ug | INTRAMUSCULAR | Status: DC | PRN

## 2016-10-05 MED ORDER — PROPOFOL 500 MG/50ML IV EMUL
INTRAVENOUS | Status: AC
  Filled 2016-10-05: qty 50

## 2016-10-05 MED ORDER — MIDAZOLAM HCL 2 MG/2ML IJ SOLN
INTRAMUSCULAR | Status: DC | PRN
  Administered 2016-10-05: 2 mg via INTRAVENOUS

## 2016-10-05 MED ORDER — OXYCODONE HCL 5 MG/5ML PO SOLN: 5.0000 mg | Freq: Once | ORAL | Status: DC | PRN

## 2016-10-05 MED ORDER — FENTANYL CITRATE (PF) 100 MCG/2ML IJ SOLN
INTRAMUSCULAR | Status: AC
  Filled 2016-10-05: qty 2

## 2016-10-05 MED ORDER — FAMOTIDINE 20 MG PO TABS
ORAL_TABLET | ORAL | Status: AC
  Administered 2016-10-05: 20 mg via ORAL
  Filled 2016-10-05: qty 1

## 2016-10-05 MED ORDER — BUPIVACAINE-EPINEPHRINE (PF) 0.25% -1:200000 IJ SOLN
INTRAMUSCULAR | Status: AC
  Filled 2016-10-05: qty 30

## 2016-10-05 MED ORDER — PROPOFOL 500 MG/50ML IV EMUL
INTRAVENOUS | Status: DC | PRN
  Administered 2016-10-05: 100 ug/kg/min via INTRAVENOUS

## 2016-10-05 MED ORDER — PROMETHAZINE HCL 25 MG/ML IJ SOLN: 6.2500 mg | INTRAMUSCULAR | Status: DC | PRN

## 2016-10-05 MED ORDER — LIDOCAINE HCL (PF) 2 % IJ SOLN
INTRAMUSCULAR | Status: AC
  Filled 2016-10-05: qty 2

## 2016-10-05 MED ORDER — CEFAZOLIN SODIUM-DEXTROSE 2-4 GM/100ML-% IV SOLN
INTRAVENOUS | Status: AC
  Filled 2016-10-05: qty 100

## 2016-10-05 MED ORDER — OXYCODONE HCL 5 MG PO TABS: 5.0000 mg | ORAL_TABLET | Freq: Once | ORAL | Status: DC | PRN

## 2016-10-05 MED ORDER — FAMOTIDINE 20 MG PO TABS
20.0000 mg | ORAL_TABLET | Freq: Once | ORAL | Status: AC
  Administered 2016-10-05: 20 mg via ORAL

## 2016-10-05 MED ORDER — SODIUM CHLORIDE 0.9 % IJ SOLN
INTRAMUSCULAR | Status: AC
  Filled 2016-10-05: qty 50

## 2016-10-05 MED ORDER — FENTANYL CITRATE (PF) 100 MCG/2ML IJ SOLN
INTRAMUSCULAR | Status: DC | PRN
  Administered 2016-10-05: 25 ug via INTRAVENOUS

## 2016-10-05 MED ORDER — SODIUM CHLORIDE 0.9 % IV SOLN
INTRAVENOUS | Status: DC | PRN
  Administered 2016-10-05: 10 mL via INTRAMUSCULAR

## 2016-10-05 MED ORDER — MIDAZOLAM HCL 2 MG/2ML IJ SOLN
INTRAMUSCULAR | Status: AC
  Filled 2016-10-05: qty 2

## 2016-10-05 MED ORDER — PROPOFOL 10 MG/ML IV BOLUS
INTRAVENOUS | Status: DC | PRN
Start: 2016-10-05 — End: 2016-10-05
  Administered 2016-10-05: 30 mg via INTRAVENOUS
  Administered 2016-10-05: 10 mg via INTRAVENOUS

## 2016-10-05 MED ORDER — PHENYLEPHRINE HCL 10 MG/ML IJ SOLN
INTRAMUSCULAR | Status: DC | PRN
  Administered 2016-10-05: 50 ug via INTRAVENOUS
  Administered 2016-10-05: 150 ug via INTRAVENOUS
  Administered 2016-10-05: 100 ug via INTRAVENOUS

## 2016-10-05 MED ORDER — MEPERIDINE HCL 50 MG/ML IJ SOLN: 6.2500 mg | INTRAMUSCULAR | Status: DC | PRN

## 2016-10-05 MED ORDER — HEPARIN SODIUM (PORCINE) 5000 UNIT/ML IJ SOLN
INTRAMUSCULAR | Status: AC
  Filled 2016-10-05: qty 1

## 2016-10-05 SURGICAL SUPPLY — 30 items
ADHESIVE MASTISOL STRL (MISCELLANEOUS) ×3 IMPLANT
CANISTER SUCT 1200ML W/VALVE (MISCELLANEOUS) ×3 IMPLANT
CHLORAPREP W/TINT 26ML (MISCELLANEOUS) ×3 IMPLANT
CLOSURE WOUND 1/2 X4 (GAUZE/BANDAGES/DRESSINGS) ×1
COVER LIGHT HANDLE STERIS (MISCELLANEOUS) ×6 IMPLANT
DRAPE C-ARM XRAY 36X54 (DRAPES) ×3 IMPLANT
DRAPE INCISE 23X17 IOBAN STRL (DRAPES) ×2
DRAPE INCISE IOBAN 23X17 STRL (DRAPES) ×1 IMPLANT
DRSG TEGADERM 2-3/8X2-3/4 SM (GAUZE/BANDAGES/DRESSINGS) ×3 IMPLANT
DRSG TEGADERM 4X4.75 (GAUZE/BANDAGES/DRESSINGS) ×3 IMPLANT
ELECT REM PT RETURN 9FT ADLT (ELECTROSURGICAL) ×3
ELECTRODE REM PT RTRN 9FT ADLT (ELECTROSURGICAL) ×1 IMPLANT
GAUZE SPONGE 4X4 12PLY STRL (GAUZE/BANDAGES/DRESSINGS) IMPLANT
GLOVE BIO SURGEON STRL SZ7.5 (GLOVE) ×9 IMPLANT
GLOVE INDICATOR 8.0 STRL GRN (GLOVE) ×9 IMPLANT
GOWN STRL REUS W/ TWL LRG LVL3 (GOWN DISPOSABLE) ×2 IMPLANT
GOWN STRL REUS W/TWL LRG LVL3 (GOWN DISPOSABLE) ×4
KIT RM TURNOVER STRD PROC AR (KITS) ×3 IMPLANT
LABEL OR SOLS (LABEL) ×3 IMPLANT
NEEDLE FILTER BLUNT 18X 1/2SAF (NEEDLE) ×2
NEEDLE FILTER BLUNT 18X1 1/2 (NEEDLE) ×1 IMPLANT
PACK PORT-A-CATH (MISCELLANEOUS) ×3 IMPLANT
PORT POWER 8FR ISP MICROINTR (INTRODUCER) ×3 IMPLANT
STRIP CLOSURE SKIN 1/2X4 (GAUZE/BANDAGES/DRESSINGS) ×2 IMPLANT
SUT MNCRL AB 4-0 PS2 18 (SUTURE) ×3 IMPLANT
SUT PROLENE 3 0 SH DA (SUTURE) ×3 IMPLANT
SUT VIC AB 3-0 SH 27 (SUTURE) ×2
SUT VIC AB 3-0 SH 27X BRD (SUTURE) ×1 IMPLANT
SYR 3ML LL SCALE MARK (SYRINGE) ×3 IMPLANT
power port clear vue isp ×3 IMPLANT

## 2016-10-05 NOTE — Anesthesia Postprocedure Evaluation (Signed)
Anesthesia Post Note  Patient: [name]  Procedure(s) Performed: Procedure(s) (LRB): INSERTION PORT-A-CATH (Right)  Patient location during evaluation: PACU Anesthesia Type: General Level of consciousness: awake and alert Pain management: pain level controlled Vital Signs Assessment: post-procedure vital signs reviewed and stable Respiratory status: spontaneous breathing, nonlabored ventilation, respiratory function stable and patient connected to nasal cannula oxygen Cardiovascular status: blood pressure returned to baseline and stable Postop Assessment: no signs of nausea or vomiting Anesthetic complications: no     Last Vitals:  Vitals:   10/05/16 1445 10/05/16 1451  BP: 98/64 98/64  Pulse: 73 71  Resp: 14 15  Temp: (!) 36.2 C   SpO2: 98% 98%    Last Pain:  Vitals:   10/05/16 1232  TempSrc: Oral                 Dainelle Hun S

## 2016-10-05 NOTE — Op Note (Addendum)
Pre-operative Diagnosis: Lung cancer  Post-operative Diagnosis: Same  Procedure performed: Right IJ Port-A-Cath placement  Surgeon: [name]   Assistants: None  Anesthesia: Monitored Local Anesthesia with Sedation  ASA Class: 2  Surgeon: [name], MD FACS  Anesthesia: Gen. with endotracheal tube  Assistant: None  Procedure Details  The patient was seen again in the Holding Room. The benefits, complications, treatment options, and expected outcomes were discussed with the patient. The risks of bleeding, infection, pneumothorax, vascular injury, any of which could require further surgery were reviewed with the patient.   The patient was taken to Operating Room, identified as [name] and the procedure verified.  A Time Out was held and the above information confirmed.  Prior to the induction of general anesthesia, antibiotic prophylaxis was administered. VTE prophylaxis was in place. Monitored anesthesia was then administered and tolerated well. After the induction, the chest and neck was prepped with Chloraprep and draped in the sterile fashion. The patient was positioned in the supine position.  The right upper chest was localized with a 1% lidocaine 0.5% Marcaine solution. The patient was placed head down. The right subclavian vein was attempted to be accessed twice without success. We then turned our attention to the right internal jugular vein. It was visualized under ultrasound and then accessed accessed with the micropuncture kit on the first attempt. Micropuncture wire was then placed and confirmed in the appropriate location under fluoroscopy. The J-wire sheath was placed over the micropuncture wire. The wire was removed without difficulty. Standard J-wire was then placed through the sheath and again confirmed and purple location under fluoroscopy. The skin was incised at the wire and the insertion dilator cannula was placed over the J-wire. The J-wire  was removed and the Chemo-Port catheter was placed into the patient's vein and confirmed in the proper location under fluoroscopy. A pocket was localized with the aforementioned local anesthetic at a previously planned location to the right chest and the skin was incised with a 15 blade scalpel. A pocket was created with electrocautery the appropriate size to accept the Chemo-Port. Using the tunneler device the catheter was tunneled under the skin to the insertion port area the catheter was confirmed in the purple location one more time under fluoroscopy prior to being cut and placed onto the port secured with the locking clamp over the catheter. The port was placed into the pocket and secured to the pectoral fascia with 2-0 Prolene suture. The pocket was closed in layers with an interrupted 3-0 Vicryl suture and then a running 4-0 Monocryl suture. The access site was also closed with a simple interrupted 4-0 Monocryl suture. The skin sites were both sides were cleaned and dried and dressed with Dermabond.  The patient tolerated the procedure well. All counts were correct at the procedure. There were no complications.  Findings: Right internal jugular Chemo-Port   Estimated Blood Loss: 5 mg         Drains: None         Specimens: None          Complications: None                  Condition: Good   [name], MD, FACS  Wrong name was placed in chart, corrected today.

## 2016-10-05 NOTE — Anesthesia Post-op Follow-up Note (Signed)
Anesthesia QCDR form completed.        

## 2016-10-05 NOTE — Interval H&P Note (Signed)
History and Physical Interval Note:  10/05/2016 1:30 PM  [name]  has presented today for surgery, with the diagnosis of LUNG CANCER  The various methods of treatment have been discussed with the patient and family. After consideration of risks, benefits and other options for treatment, the patient has consented to  Procedure(s): INSERTION PORT-A-CATH (N/A) as a surgical intervention .  The patient's history has been reviewed, patient examined, no change in status, stable for surgery.  I have reviewed the patient's chart and labs.  Questions were answered to the patient's satisfaction.     [name]

## 2016-10-05 NOTE — Brief Op Note (Signed)
10/05/2016  2:45 PM  PATIENT:  [name]  71 y.o. male  PRE-OPERATIVE DIAGNOSIS:  LUNG CANCER  POST-OPERATIVE DIAGNOSIS:  LUNG CANCER  PROCEDURE:  Procedure(s): INSERTION PORT-A-CATH (Right)  SURGEON:  Surgeon(s) and Role:    * [name], MD - Primary  PHYSICIAN ASSISTANT:   ASSISTANTS: none   ANESTHESIA:   none and MAC  EBL:  Total I/O In: 200 [I.V.:200] Out: 5 [Blood:5]  BLOOD ADMINISTERED:none  DRAINS: none   LOCAL MEDICATIONS USED:  MARCAINE   , XYLOCAINE  and Amount: 10 ml  SPECIMEN:  No Specimen  DISPOSITION OF SPECIMEN:  N/A  COUNTS:  YES  TOURNIQUET:  * No tourniquets in log *  DICTATION: .Dragon Dictation  PLAN OF CARE: Discharge to home after PACU  PATIENT DISPOSITION:  PACU - hemodynamically stable.   Delay start of Pharmacological VTE agent (>24hrs) due to surgical blood loss or risk of bleeding: not applicable

## 2016-10-05 NOTE — Anesthesia Preprocedure Evaluation (Signed)
Anesthesia Evaluation  Patient identified by MRN, date of birth, ID band Patient awake    Reviewed: Allergy & Precautions, NPO status , Patient's Chart, lab work & pertinent test results  History of Anesthesia Complications Negative for: history of anesthetic complications  Airway Mallampati: II  TM Distance: >3 FB Neck ROM: Full    Dental no notable dental hx.    Pulmonary neg sleep apnea, neg COPD, former smoker,    breath sounds clear to auscultation- rhonchi (-) wheezing      Cardiovascular Exercise Tolerance: Good (-) hypertension(-) CAD and (-) Past MI  Rhythm:Regular Rate:Normal - Systolic murmurs and - Diastolic murmurs    Neuro/Psych negative neurological ROS  negative psych ROS   GI/Hepatic negative GI ROS, Neg liver ROS,   Endo/Other  negative endocrine ROSneg diabetes  Renal/GU negative Renal ROS     Musculoskeletal negative musculoskeletal ROS (+)   Abdominal (+) + obese,   Peds  Hematology negative hematology ROS (+)   Anesthesia Other Findings Past Medical History: No date: Enlarged prostate   Reproductive/Obstetrics                             Anesthesia Physical Anesthesia Plan  ASA: II  Anesthesia Plan: General   Post-op Pain Management:    Induction: Intravenous  PONV Risk Score and Plan: 1 and Propofol infusion  Airway Management Planned: Natural Airway  Additional Equipment:   Intra-op Plan:   Post-operative Plan:   Informed Consent: I have reviewed the patients History and Physical, chart, labs and discussed the procedure including the risks, benefits and alternatives for the proposed anesthesia with the patient or authorized representative who has indicated his/her understanding and acceptance.   Dental advisory given  Plan Discussed with: CRNA and Anesthesiologist  Anesthesia Plan Comments:         Anesthesia Quick Evaluation

## 2016-10-05 NOTE — Anesthesia Procedure Notes (Signed)
Date/Time: 10/05/2016 1:41 PM Performed by: Hedda Slade Pre-anesthesia Checklist: Patient identified, Emergency Drugs available, Suction available and Patient being monitored Patient Re-evaluated:Patient Re-evaluated prior to induction Oxygen Delivery Method: Simple face mask

## 2016-10-05 NOTE — Discharge Instructions (Signed)
AMBULATORY SURGERY  DISCHARGE INSTRUCTIONS   1) The drugs that you were given will stay in your system until tomorrow so for the next 24 hours you should not:  A) Drive an automobile B) Make any legal decisions C) Drink any alcoholic beverage   2) You may resume regular meals tomorrow.  Today it is better to start with liquids and gradually work up to solid foods.  You may eat anything you prefer, but it is better to start with liquids, then soup and crackers, and gradually work up to solid foods.   3) Please notify your doctor immediately if you have any unusual bleeding, trouble breathing, redness and pain at the surgery site, drainage, fever, or pain not relieved by medication.  4) Your post-operative visit with Dr.                                     is: Date:                        Time:    Please call to schedule your post-operative visit.  5) Additional Instructions:  Tunneled Catheter Insertion, Care After Refer to this sheet in the next few weeks. These instructions provide you with information about caring for yourself after your procedure. Your health care provider may also give you more specific instructions. Your treatment has been planned according to current medical practices, but problems sometimes occur. Call your health care provider if you have any problems or questions after your procedure. What can I expect after the procedure? After the procedure, it is common to have:  Some mild redness, swelling, and pain around your catheter site.  A small amount of blood or clear fluid coming from your incisions.  Follow these instructions at home: Incision care  Check your incision areas every day for signs of infection. Check for: ? More redness, swelling, or pain. ? More fluid or blood. ? Warmth. ? Pus or a bad smell.  Follow instructions from your health care provider about how to take care of your  incisions. Make sure you: ? Wash your hands with soap and water before you change your bandages (dressings). If soap and water are not available, use hand sanitizer. ? Change your dressings as told by your health care provider. Wash the area around your incisions with a germ-killing (antiseptic) solution when you change your dressing, as told by your health care provider. ? Leave stitches (sutures), skin glue, or adhesive strips in place. These skin closures may need to stay in place for 2 weeks or longer. If adhesive strip edges start to loosen and curl up, you may trim the loose edges. Do not remove adhesive strips completely unless your health care provider tells you to do that. Catheter Care   Wash your hands with soap and water before and after caring for your catheter. If soap and water are not available, use hand sanitizer.  Keep your catheter site and your dressings clean and dry.  Apply an antibiotic ointment to your catheter site as told by your health care provider.  Flush your catheter as told by your health care provider. This helps prevent it from becoming clogged.  Do not  open the caps on the ends of the catheter.  Do not pull on your catheter.  If your catheter is in your arm: ? Avoid wearing tight clothes or tight jewelry on your arm that has the catheter. ? Do not sleep with your head on the arm that has the catheter. ? Do not allow your blood pressure to be taken on the arm that has the catheter. ? Do not allow your blood to be drawn from the arm that has the catheter, except through the catheter itself. Medicines  Take over-the-counter and prescription medicines only as told by your health care provider.  If you were prescribed an antibiotic medicine, take it as told by your health care provider. Do not stop taking the antibiotic even if you start to feel better. Activity  Return to your normal activities as told by your health care provider. Ask your health care  provider what activities are safe for you.  Do not lift anything that is heavier than 10 lb (4.5 kg) for 3 weeks or as long as told by your health care provider. Driving  Do not drive until your health care provider approves.  Do not drive or operate heavy machinery while taking prescription pain medicine. General instructions  Follow your health care provider's specific instructions for the type of catheter that you have.  Do not take baths, swim, or use a hot tub until your health care provider approves.  Follow instructions from your health care provider about eating or drinking restrictions.  Wear compression stockings as told by your health care provider. These stockings help to prevent blood clots and reduce swelling in your legs.  Keep all follow-up visits as told by your health care provider. This is important. Contact a health care provider if:  You have more fluid or blood coming from your incisions.  You have more redness, swelling, or pain at your incisions or around the area where your catheter is inserted.  Your incisions feel warm to the touch.  You feel unusually weak.  You feel nauseous.  Your catheter is not working properly.  You have blood or fluid draining from your catheter.  You are unable to flush your catheter. Get help right away if:  Your catheter breaks.  A hole develops in your catheter.  Your catheter comes loose or gets pulled completely out. If this happens, press on your catheter site firmly with your hand or a clean cloth until you get medical help.  Your catheter becomes blocked.  You have swelling in your arm, shoulder, neck, or face.  You develop chest pain.  You have difficulty breathing.  You feel dizzy or light-headed.  You have pus or a bad smell coming from your incisions.  You have a fever.  You develop bleeding from your catheter or your insertion site, and your bleeding does not stop. This information is not  intended to replace advice given to you by your health care provider. Make sure you discuss any questions you have with your health care provider. Document Released: 01/18/2012 Document Revised: 10/04/2015 Document Reviewed: 10/27/2014 Elsevier Interactive Patient Education  [employer].

## 2016-10-05 NOTE — Transfer of Care (Signed)
Immediate Anesthesia Transfer of Care Note  Patient: [name]  Procedure(s) Performed: Procedure(s): INSERTION PORT-A-CATH (Right)  Patient Location: PACU  Anesthesia Type:General  Level of Consciousness: drowsy and patient cooperative  Airway & Oxygen Therapy: Patient Spontanous Breathing and Patient connected to face mask oxygen  Post-op Assessment: Report given to RN and Post -op Vital signs reviewed and stable  Post vital signs: Reviewed and stable  Last Vitals:  Vitals:   10/05/16 1232  BP: (!) 170/86  Pulse: 86  Resp: (!) 22  Temp: 37.2 C  SpO2: 98%    Last Pain:  Vitals:   10/05/16 1232  TempSrc: Oral         Complications: No apparent anesthesia complications

## 2016-10-05 NOTE — H&P (View-Only) (Signed)
HISTORY: [name] returns today in follow-up. He has seen oncology and is scheduled to see radiation therapy next week. He has been given his diagnosis of metastatic squamous cell carcinoma requiring concurrent radiation therapy and chemotherapy for his left upper lobe squamous cell carcinoma with mediastinal metastases. He is seen today at the request of the oncologist for insertion of Port-A-Cath.   EXAM:    Resp:   Lungs are clear bilaterally.  No respiratory distress, normal effort. Heart:  Regular without murmurs Abd:  Abdomen is soft, non distended and non tender. No masses are palpable.  There is no rebound and no guarding.  Neurological: Alert and oriented to person, place, and time. Coordination normal.  Skin:    Skin is warm and dry. No rash noted. No diaphoretic. No erythema. No pallor.  Psychiatric: Normal mood and affect. Normal behavior. Judgment and thought content normal.    ASSESSMENT: Metastatic squamous cell carcinoma the left upper lobe   PLAN:   I had a long discussion with the patient and his family today. They understand that he has unresectable lung cancer and requires concurrent radiation therapy and chemotherapy. He is a good candidate for Port-A-Cath insertion. I reviewed with him the indications and risks including the risks of bleeding, infection, pneumothorax and death. All his questions were answered. He understands that Dr. [name] will be performing the surgery next week as I will be out of the institution at that time.

## 2016-10-06 ENCOUNTER — Other Ambulatory Visit: Payer: Medicare HMO

## 2016-10-06 ENCOUNTER — Encounter: Payer: Self-pay | Admitting: General Surgery

## 2016-10-06 DIAGNOSIS — Z510 Encounter for antineoplastic radiation therapy: Secondary | ICD-10-CM | POA: Diagnosis not present

## 2016-10-07 ENCOUNTER — Ambulatory Visit
Admission: RE | Admit: 2016-10-07 | Discharge: 2016-10-07 | Disposition: A | Discharge status: Home / Self Care | Payer: Medicare HMO | Source: Ambulatory Visit | Attending: Oncology | Admitting: Oncology

## 2016-10-07 DIAGNOSIS — R590 Localized enlarged lymph nodes: Secondary | ICD-10-CM | POA: Diagnosis not present

## 2016-10-07 MED ORDER — GADOBENATE DIMEGLUMINE 529 MG/ML IV SOLN
20.0000 mL | Freq: Once | INTRAVENOUS | Status: AC | PRN
  Administered 2016-10-07: 10 mL via INTRAVENOUS

## 2016-10-11 ENCOUNTER — Telehealth: Payer: Self-pay | Admitting: *Deleted

## 2016-10-11 ENCOUNTER — Other Ambulatory Visit: Payer: Self-pay | Admitting: Oncology

## 2016-10-11 DIAGNOSIS — C3412 Malignant neoplasm of upper lobe, left bronchus or lung: Secondary | ICD-10-CM

## 2016-10-11 MED ORDER — PROCHLORPERAZINE MALEATE 10 MG PO TABS: 10.0000 mg | ORAL_TABLET | Freq: Four times a day (QID) | ORAL | 1 refills | Status: DC | PRN

## 2016-10-11 MED ORDER — ONDANSETRON HCL 8 MG PO TABS: 8.0000 mg | ORAL_TABLET | Freq: Two times a day (BID) | ORAL | 1 refills | Status: DC | PRN

## 2016-10-11 MED ORDER — LIDOCAINE-PRILOCAINE 2.5-2.5 % EX CREA: TOPICAL_CREAM | CUTANEOUS | 3 refills | Status: DC

## 2016-10-11 MED ORDER — DEXAMETHASONE 4 MG PO TABS: 8.0000 mg | ORAL_TABLET | Freq: Every day | ORAL | 1 refills | Status: DC

## 2016-10-11 NOTE — Telephone Encounter (Signed)
Will release his meds to his pharmacy.

## 2016-10-11 NOTE — Telephone Encounter (Signed)
Pt's sig other called to request if nausea medications and emla cream could be called in to pt's pharmacy. Pt scheduled to start treatment next week and would like to get all prescriptions filled this week if possible. Please call in nausea meds and emla cream. I will notify the pt when completed.   Thanks!

## 2016-10-12 ENCOUNTER — Encounter: Payer: Self-pay | Admitting: General Surgery

## 2016-10-12 ENCOUNTER — Ambulatory Visit (INDEPENDENT_AMBULATORY_CARE_PROVIDER_SITE_OTHER): Payer: Medicare HMO | Admitting: General Surgery

## 2016-10-12 VITALS — BP 136/77 | HR 81 | Temp 98.2°F | Ht 72.0 in | Wt 262.0 lb

## 2016-10-12 DIAGNOSIS — Z4889 Encounter for other specified surgical aftercare: Secondary | ICD-10-CM

## 2016-10-12 NOTE — Patient Instructions (Signed)
Please call our office with any questions or concerns. 

## 2016-10-12 NOTE — Progress Notes (Signed)
Outpatient Surgical Follow Up  10/12/2016  [name] is an 71 y.o. male.   Chief Complaint  Patient presents with  . Routine Post Op    Port Placement-10/05/16-Dr.Rosenda Geffrard    HPI: 71 year old male returns to clinic for follow-up 1 week status post right IJ port placement. Patient reports doing well. He denies any pain. He denies any fevers, chills, nausea, vomiting, chest pain, shortness breath, diarrhea, constipation. States he starts chemotherapy next week.  Past Medical History:  Diagnosis Date  . Enlarged prostate     Past Surgical History:  Procedure Laterality Date  . NO PAST SURGERIES    . PORTACATH PLACEMENT Right 10/05/2016   Procedure: INSERTION PORT-A-CATH;  Surgeon: [name], MD;  Location: [facility] ORS;  Service: General;  Laterality: Right;    Family History  Problem Relation Age of Onset  . Lymphoma Mother   . Diabetes Mother   . Lung cancer Sister   . Lung cancer Brother   . Lung cancer Brother     Social History:  reports that he quit smoking about 24 years ago. His smoking use included Cigarettes. He has a 15.00 pack-year smoking history. He has never used smokeless tobacco. He reports that he does not drink alcohol or use drugs.  Allergies: No Known Allergies  Medications reviewed.    ROS A multipoint review of systems was completed all pertinent positives and negatives are documented within the history of present illness the remainder are negative   BP 136/77   Pulse 81   Temp 98.2 F (36.8 C) (Oral)   Ht 6' (1.829 m)   Wt 118.8 kg (262 lb)   BMI 35.53 kg/m   Physical Exam  Gen.: No acute distress Neck: Supple and nontender, insertion site of the IJ catheter visualized with separation of the skin but no evidence of infection, Chest: Right chest port site with skin well approximated with Dermabond. Palpable port. No evidence of erythema or drainage. Lungs are clear to auscultation  heart: Regular rate and rhythm Abdomen: Soft and  nontender   No results found for this or any previous visit (from the past 48 hour(s)). No results found.  Assessment/Plan:  1. Aftercare following surgery 71 year old male 1 week status post chemotherapy port placement to the right IJ. Doing very well. Discussed signs and symptoms of infection and return to clinic immediately should they occur. Otherwise discussed anticipated healing as well as expected longevity of the Chemo-Port that was placed. Given the skin opening of the neck discussed keeping the area covered with a what appeared bandage until the skin is fully healed. He voiced understanding and will follow up on an as-needed basis.     [name], MD FACS General Surgeon  10/12/2016,9:18 AM

## 2016-10-12 NOTE — Telephone Encounter (Signed)
Message left with pt's sig. other that prescriptions have been sent to the pharmacy.

## 2016-10-13 ENCOUNTER — Ambulatory Visit
Admission: RE | Admit: 2016-10-13 | Discharge: 2016-10-13 | Disposition: A | Discharge status: Home / Self Care | Payer: Medicare HMO | Source: Ambulatory Visit | Attending: Radiation Oncology | Admitting: Radiation Oncology

## 2016-10-14 ENCOUNTER — Ambulatory Visit: Payer: Medicare HMO | Admitting: Family Medicine

## 2016-10-14 ENCOUNTER — Encounter: Payer: Self-pay | Admitting: Oncology

## 2016-10-16 ENCOUNTER — Other Ambulatory Visit: Payer: Self-pay | Admitting: Oncology

## 2016-10-18 ENCOUNTER — Inpatient Hospital Stay: Payer: Medicare HMO

## 2016-10-18 ENCOUNTER — Ambulatory Visit
Admission: RE | Admit: 2016-10-18 | Discharge: 2016-10-18 | Disposition: A | Discharge status: Home / Self Care | Payer: Medicare HMO | Source: Ambulatory Visit | Attending: Radiation Oncology | Admitting: Radiation Oncology

## 2016-10-18 DIAGNOSIS — Z510 Encounter for antineoplastic radiation therapy: Secondary | ICD-10-CM | POA: Diagnosis not present

## 2016-10-19 ENCOUNTER — Ambulatory Visit
Admission: RE | Admit: 2016-10-19 | Discharge: 2016-10-19 | Disposition: A | Discharge status: Home / Self Care | Payer: Medicare HMO | Source: Ambulatory Visit | Attending: Radiation Oncology | Admitting: Radiation Oncology

## 2016-10-19 ENCOUNTER — Ambulatory Visit: Payer: Medicare HMO | Admitting: Family Medicine

## 2016-10-19 DIAGNOSIS — Z510 Encounter for antineoplastic radiation therapy: Secondary | ICD-10-CM | POA: Diagnosis not present

## 2016-10-20 ENCOUNTER — Other Ambulatory Visit: Payer: Self-pay | Admitting: Oncology

## 2016-10-20 ENCOUNTER — Ambulatory Visit
Admission: RE | Admit: 2016-10-20 | Discharge: 2016-10-20 | Disposition: A | Discharge status: Home / Self Care | Payer: Medicare HMO | Source: Ambulatory Visit | Attending: Radiation Oncology | Admitting: Radiation Oncology

## 2016-10-20 DIAGNOSIS — Z510 Encounter for antineoplastic radiation therapy: Secondary | ICD-10-CM | POA: Diagnosis not present

## 2016-10-20 NOTE — Progress Notes (Signed)
  Follow-up of

## 2016-10-21 ENCOUNTER — Inpatient Hospital Stay (HOSPITAL_BASED_OUTPATIENT_CLINIC_OR_DEPARTMENT_OTHER): Payer: Medicare HMO | Admitting: Oncology

## 2016-10-21 ENCOUNTER — Encounter: Payer: Self-pay | Admitting: Oncology

## 2016-10-21 ENCOUNTER — Inpatient Hospital Stay: Payer: Medicare HMO

## 2016-10-21 ENCOUNTER — Encounter: Payer: Self-pay | Admitting: Family Medicine

## 2016-10-21 ENCOUNTER — Ambulatory Visit (INDEPENDENT_AMBULATORY_CARE_PROVIDER_SITE_OTHER): Payer: Medicare HMO | Admitting: Family Medicine

## 2016-10-21 ENCOUNTER — Inpatient Hospital Stay: Payer: Medicare HMO | Attending: Oncology

## 2016-10-21 ENCOUNTER — Ambulatory Visit
Admission: RE | Admit: 2016-10-21 | Discharge: 2016-10-21 | Disposition: A | Discharge status: Home / Self Care | Payer: Medicare HMO | Source: Ambulatory Visit | Attending: Radiation Oncology | Admitting: Radiation Oncology

## 2016-10-21 ENCOUNTER — Encounter: Payer: Self-pay | Admitting: *Deleted

## 2016-10-21 VITALS — BP 165/91 | HR 71 | Temp 97.4°F | Resp 20 | Wt 259.4 lb

## 2016-10-21 VITALS — BP 129/75 | HR 67

## 2016-10-21 VITALS — BP 124/78 | HR 93 | Resp 16 | Ht 72.0 in | Wt 261.0 lb

## 2016-10-21 DIAGNOSIS — Z7982 Long term (current) use of aspirin: Secondary | ICD-10-CM | POA: Insufficient documentation

## 2016-10-21 DIAGNOSIS — J439 Emphysema, unspecified: Secondary | ICD-10-CM | POA: Insufficient documentation

## 2016-10-21 DIAGNOSIS — R042 Hemoptysis: Secondary | ICD-10-CM

## 2016-10-21 DIAGNOSIS — Z801 Family history of malignant neoplasm of trachea, bronchus and lung: Secondary | ICD-10-CM | POA: Diagnosis not present

## 2016-10-21 DIAGNOSIS — J029 Acute pharyngitis, unspecified: Secondary | ICD-10-CM | POA: Insufficient documentation

## 2016-10-21 DIAGNOSIS — C3492 Malignant neoplasm of unspecified part of left bronchus or lung: Secondary | ICD-10-CM

## 2016-10-21 DIAGNOSIS — Z87891 Personal history of nicotine dependence: Secondary | ICD-10-CM | POA: Diagnosis not present

## 2016-10-21 DIAGNOSIS — R05 Cough: Secondary | ICD-10-CM | POA: Diagnosis not present

## 2016-10-21 DIAGNOSIS — E669 Obesity, unspecified: Secondary | ICD-10-CM | POA: Diagnosis not present

## 2016-10-21 DIAGNOSIS — Z79899 Other long term (current) drug therapy: Secondary | ICD-10-CM

## 2016-10-21 DIAGNOSIS — E785 Hyperlipidemia, unspecified: Secondary | ICD-10-CM | POA: Diagnosis not present

## 2016-10-21 DIAGNOSIS — R634 Abnormal weight loss: Secondary | ICD-10-CM | POA: Insufficient documentation

## 2016-10-21 DIAGNOSIS — L739 Follicular disorder, unspecified: Secondary | ICD-10-CM

## 2016-10-21 DIAGNOSIS — Z5111 Encounter for antineoplastic chemotherapy: Secondary | ICD-10-CM | POA: Diagnosis present

## 2016-10-21 DIAGNOSIS — E559 Vitamin D deficiency, unspecified: Secondary | ICD-10-CM

## 2016-10-21 DIAGNOSIS — E538 Deficiency of other specified B group vitamins: Secondary | ICD-10-CM | POA: Diagnosis not present

## 2016-10-21 DIAGNOSIS — Z510 Encounter for antineoplastic radiation therapy: Secondary | ICD-10-CM | POA: Diagnosis not present

## 2016-10-21 DIAGNOSIS — N400 Enlarged prostate without lower urinary tract symptoms: Secondary | ICD-10-CM

## 2016-10-21 DIAGNOSIS — C3412 Malignant neoplasm of upper lobe, left bronchus or lung: Secondary | ICD-10-CM

## 2016-10-21 LAB — COMPREHENSIVE METABOLIC PANEL
ALT: 15 U/L — ABNORMAL LOW (ref 17–63)
AST: 17 U/L (ref 15–41)
Albumin: 3.6 g/dL (ref 3.5–5.0)
Alkaline Phosphatase: 52 U/L (ref 38–126)
Anion gap: 7 (ref 5–15)
BUN: 10 mg/dL (ref 6–20)
CHLORIDE: 105 mmol/L (ref 101–111)
CO2: 24 mmol/L (ref 22–32)
Calcium: 8.6 mg/dL — ABNORMAL LOW (ref 8.9–10.3)
Creatinine, Ser: 1.1 mg/dL (ref 0.61–1.24)
GFR calc Af Amer: >60 mL/min (ref >60)
Glucose, Bld: 99 mg/dL (ref 65–99)
Potassium: 3.8 mmol/L (ref 3.5–5.1)
SODIUM: 136 mmol/L (ref 135–145)
Total Bilirubin: 1.2 mg/dL (ref 0.3–1.2)
Total Protein: 7.5 g/dL (ref 6.5–8.1)

## 2016-10-21 LAB — CBC WITH DIFFERENTIAL/PLATELET
BASOS ABS: 0 10*3/uL (ref 0–0.1)
BASOS PCT: 0 %
EOS ABS: 0.2 10*3/uL (ref 0–0.7)
Eosinophils Relative: 2 %
HCT: 43.5 % (ref 40.0–52.0)
Hemoglobin: 14.4 g/dL (ref 13.0–18.0)
Lymphocytes Relative: 12 %
Lymphs Abs: 1 10*3/uL (ref 1.0–3.6)
MCH: 28.3 pg (ref 26.0–34.0)
MCHC: 33.2 g/dL (ref 32.0–36.0)
MCV: 85.1 fL (ref 80.0–100.0)
MONOS PCT: 7 %
Monocytes Absolute: 0.6 10*3/uL (ref 0.2–1.0)
NEUTROS PCT: 79 %
Neutro Abs: 6.4 10*3/uL (ref 1.4–6.5)
PLATELETS: 217 10*3/uL (ref 150–440)
RBC: 5.1 MIL/uL (ref 4.40–5.90)
RDW: 14.3 % (ref 11.5–14.5)
WBC: 8.2 10*3/uL (ref 3.8–10.6)

## 2016-10-21 MED ORDER — HEPARIN SOD (PORK) LOCK FLUSH 100 UNIT/ML IV SOLN
500.0000 [IU] | Freq: Once | INTRAVENOUS | Status: AC
  Administered 2016-10-21: 500 [IU] via INTRAVENOUS
  Filled 2016-10-21: qty 5

## 2016-10-21 MED ORDER — SODIUM CHLORIDE 0.9% FLUSH
10.0000 mL | INTRAVENOUS | Status: DC | PRN
  Administered 2016-10-21: 10 mL via INTRAVENOUS
  Filled 2016-10-21: qty 10

## 2016-10-21 MED ORDER — FAMOTIDINE IN NACL 20-0.9 MG/50ML-% IV SOLN
20.0000 mg | Freq: Once | INTRAVENOUS | Status: AC
  Administered 2016-10-21: 20 mg via INTRAVENOUS
  Filled 2016-10-21: qty 50

## 2016-10-21 MED ORDER — DEXAMETHASONE SODIUM PHOSPHATE 100 MG/10ML IJ SOLN
20.0000 mg | Freq: Once | INTRAMUSCULAR | Status: AC
  Administered 2016-10-21: 20 mg via INTRAVENOUS
  Filled 2016-10-21: qty 2

## 2016-10-21 MED ORDER — SODIUM CHLORIDE 0.9 % IV SOLN
261.8000 mg | Freq: Once | INTRAVENOUS | Status: AC
  Administered 2016-10-21: 260 mg via INTRAVENOUS
  Filled 2016-10-21: qty 26

## 2016-10-21 MED ORDER — DIPHENHYDRAMINE HCL 50 MG/ML IJ SOLN
50.0000 mg | Freq: Once | INTRAMUSCULAR | Status: AC
  Administered 2016-10-21: 50 mg via INTRAVENOUS
  Filled 2016-10-21: qty 1

## 2016-10-21 MED ORDER — SODIUM CHLORIDE 0.9 % IV SOLN
Freq: Once | INTRAVENOUS | Status: AC
  Administered 2016-10-21: 10:00:00 via INTRAVENOUS
  Filled 2016-10-21: qty 1000

## 2016-10-21 MED ORDER — PALONOSETRON HCL INJECTION 0.25 MG/5ML
0.2500 mg | Freq: Once | INTRAVENOUS | Status: AC
  Administered 2016-10-21: 0.25 mg via INTRAVENOUS
  Filled 2016-10-21: qty 5

## 2016-10-21 MED ORDER — OMEPRAZOLE 40 MG PO CPDR: 40.0000 mg | DELAYED_RELEASE_CAPSULE | Freq: Every day | ORAL | 3 refills | Status: DC

## 2016-10-21 MED ORDER — PACLITAXEL CHEMO INJECTION 300 MG/50ML
45.0000 mg/m2 | Freq: Once | INTRAVENOUS | Status: AC
  Administered 2016-10-21: 114 mg via INTRAVENOUS
  Filled 2016-10-21: qty 19

## 2016-10-21 MED ORDER — MUPIROCIN 2 % EX OINT: 1.0000 "application " | TOPICAL_OINTMENT | Freq: Two times a day (BID) | CUTANEOUS | 2 refills | Status: DC | PRN

## 2016-10-21 NOTE — Patient Instructions (Signed)
Stop aspirin, red yeast, and terazosin. You may restart the terazosin if you develop urinary symptoms.

## 2016-10-21 NOTE — Progress Notes (Signed)
Date:  10/21/2016   Name:  [name]   DOB:  05-21-1945   MRN:  [id number]  PCP:  Adline Potter, MD    Chief Complaint: Hyperlipidemia (wants to know when he can go on cholesterol meds or back on fish oil )   History of Present Illness:  This is a 71 y.o. male seen for two month f/u. Started chemo today with RT for SCLC. No side effects noted yet, feels well except for cough productive of mucus. Taking Hytrin for BPH for years, no current sxs, does c/o BLE edema which improves overnight. Weight stable, on B12 and increased vit D supplement. Request refill Bactroban for folliculitis posterior scalp.  Review of Systems:  Review of Systems  Constitutional: Negative for chills, fatigue and fever.  HENT: Negative for trouble swallowing.   Respiratory: Negative for shortness of breath.   Cardiovascular: Negative for chest pain.  Gastrointestinal: Negative for abdominal pain and nausea.  Genitourinary: Negative for difficulty urinating.  Neurological: Negative for syncope and light-headedness.    Patient Active Problem List   Diagnosis Date Noted  . Folliculitis 51/76/1607  . BPH (benign prostatic hyperplasia) 10/21/2016  . Squamous cell lung cancer, left ([place]) 09/07/2016  . B12 deficiency 09/07/2016  . Vitamin D deficiency 09/07/2016  . Obesity (BMI 30-39.9) 09/07/2016  . Hyperlipidemia 09/07/2016  . Colon polyps 09/07/2016    Prior to Admission medications   Medication Sig Start Date End Date Taking? Authorizing Provider  Aspirin-Caffeine (BAYER BACK & BODY PO) Take 1 tablet by mouth daily as needed (pain).    Yes [provider]  chlorpheniramine-HYDROcodone (TUSSIONEX PENNKINETIC ER) 10-8 MG/5ML SUER Take 5 mLs by mouth every 12 (twelve) hours as needed. Patient taking differently: Take 5 mLs by mouth every 12 (twelve) hours as needed for cough.  09/07/16  Yes Gaetan Spieker, Gwyndolyn Saxon, MD  Cholecalciferol (VITAMIN D3) 2000 units capsule Take 1 capsule (2,000 Units total) by  mouth daily. 09/08/16  Yes Breland Elders, Gwyndolyn Saxon, MD  cyanocobalamin 500 MCG tablet Take 500 mcg by mouth daily.   Yes [provider]  dexamethasone (DECADRON) 4 MG tablet Take 2 tablets (8 mg total) by mouth daily. Start the day after chemotherapy for 2 days. 10/11/16  Yes Earlie Server, MD  lidocaine-prilocaine (EMLA) cream Apply to affected area once 10/11/16  Yes Earlie Server, MD  mupirocin ointment (BACTROBAN) 2 % Apply 1 application topically 2 (two) times daily as needed. 10/21/16  Yes Jaire Pinkham, Gwyndolyn Saxon, MD  omeprazole (PRILOSEC) 40 MG capsule Take 1 capsule (40 mg total) by mouth daily. 10/21/16  Yes Earlie Server, MD  ondansetron (ZOFRAN) 8 MG tablet Take 1 tablet (8 mg total) by mouth 2 (two) times daily as needed for refractory nausea / vomiting. Start on day 3 after chemo. 10/11/16  Yes Earlie Server, MD  prochlorperazine (COMPAZINE) 10 MG tablet Take 1 tablet (10 mg total) by mouth every 6 (six) hours as needed (Nausea or vomiting). 10/11/16  Yes Earlie Server, MD    No Known Allergies  Past Surgical History:  Procedure Laterality Date  . NO PAST SURGERIES    . PORTACATH PLACEMENT Right 10/05/2016   Procedure: INSERTION PORT-A-CATH;  Surgeon: [name], MD;  Location: [facility] ORS;  Service: General;  Laterality: Right;    Social History  Substance Use Topics  . Smoking status: Former Smoker    Packs/day: 1.00    Years: 15.00    Types: Cigarettes    Quit date: 09/28/1992  . Smokeless tobacco: Never Used  .  Alcohol use No    Family History  Problem Relation Age of Onset  . Lymphoma Mother   . Diabetes Mother   . Lung cancer Sister   . Lung cancer Brother   . Lung cancer Brother     Medication list has been reviewed and updated.  Physical Examination: BP 124/78   Pulse 93   Resp 16   Ht 6' (1.829 m)   Wt 261 lb (118.4 kg)   SpO2 95%   BMI 35.40 kg/m   Physical Exam  Constitutional: He appears well-developed and well-nourished.  Cardiovascular: Normal rate, regular rhythm and normal  heart sounds.   Pulmonary/Chest: Effort normal and breath sounds normal.  Musculoskeletal:  1+ BLE edema  Neurological: He is alert.  Skin: Skin is warm and dry.  Mild folliculitis posterior scalp  Psychiatric: He has a normal mood and affect. His behavior is normal.  Nursing note and vitals reviewed.   Assessment and Plan:  1. Squamous cell lung cancer, left ([place]) Started chemo with RT today, discussed possible side effects and empowered to discontinue if too severe  2. Folliculitis Refill Bactroban  3. Hyperlipidemia, unspecified hyperlipidemia type Discussed no clear indication for rx given lack of established CVD, d/c red yeast  4. Benign prostatic hyperplasia without lower urinary tract symptoms Sxs well controlled on Hytrin but may be contributing to BLE edema, will d/c and restart if urinary sxs recur  5. B12 deficiency Well controlled on supplement, consider d/c next visit  6. Vitamin D deficiency On increased supplement, consider d/c next visit  7. Obesity (BMI 30-39.9) Weight stable, encouraged continued activity during CTX/RT  Return in about 3 months (around 01/20/2017).  [name]. [facility]  10/21/2016

## 2016-10-21 NOTE — Progress Notes (Signed)
  Oncology Nurse Navigator Documentation  Navigator Location: [facility] (10/21/16 1000) Referral date to RadOnc/MedOnc: 09/09/16 (10/21/16 1000) )Navigator Encounter Type: Follow-up Appt (10/21/16 1000)   Abnormal Finding Date: 08/31/16 (10/21/16 1000) Confirmed Diagnosis Date: 09/23/16 (10/21/16 1000)             Treatment Initiated Date: 10/19/16 (10/21/16 1000) Patient Visit Type: MedOnc (10/21/16 1000) Treatment Phase: First Chemo Tx (10/21/16 1000) Barriers/Navigation Needs: No barriers at this time;No Questions;No Needs (10/21/16 1000)   Interventions: None required (10/21/16 1000)      met with patient and wife prior to first chemo. All questions answered at the time of visit. Pt and wife do not have any further questions at this time. Informed to give me a call if needs anything further. Pt and wife verbalized understanding.               Time Spent with Patient: 30 (10/21/16 1000)

## 2016-10-21 NOTE — Progress Notes (Signed)
[name] Initial Visit:  Patient Care Team: Adline Potter, MD as PCP - General (Family Medicine) Telford Nab, RN as Registered Nurse  CHIEF COMPLAINTS/PURPOSE OF CONSULTATION: Here for newly diagnosed lung [name].  HISTORY OF PRESENTING ILLNESS: 1  [name] 71 y.o. African American male previous healthy, former smoker who is referred to me for evaluation and management of newly diagnosed lung [name]. He gets his usual care at [facility]. He presented last month to urgent care due to coughing and hemoptysis. X ray showed left lung mass. Cough up small amount of blood mixed in the sputum. Denies SOB, chest pain, back pain, abdominal pain. Denies headache, vision changes. He works a Administrator.   2 Images: CT showed LUL mass with a satellite lesion as well as mediastinal adenopathy.  PET was done which showed hypermetabolic left upper lobe mass as well as the adjacent nodule, ipsilateral hilar and mediastinal lymphadenopathy. CT guided left lung mass biopsy showed squamous lung carcinoma.  MRI brain showed no intracranial mets.    3 Pathology:  09/22/2016 left upper lobe biopsy showed squamous cell carcinoma.  Foundation one test: ordered prior to patient first visit. Showed NO EGFR, ROS1, ALK, BRAF, MET mutation. TPS 50%.    INTERVAL HISTORY Patient presents for evaluation before cycle 1 weekly carbo Taxol concurrent RT for stage III lung squamous cell. Patient has started on radiation treatment and request chemotherapy to be started a few days after the start of RT.  He has no complaints. Still have coughing spells.   Review of Systems  Constitutional: Negative.   HENT:  Negative.   Eyes: Negative.   Respiratory: Positive for cough and hemoptysis.   Cardiovascular: Negative.   Gastrointestinal: Negative.   Endocrine: Negative.   Genitourinary: Negative.    Musculoskeletal: Negative.   Skin: Negative.   Neurological: Negative.   Hematological: Negative.    Psychiatric/Behavioral: Negative.     MEDICAL HISTORY: Past Medical History:  Diagnosis Date  . Enlarged prostate     SURGICAL HISTORY: Past Surgical History:  Procedure Laterality Date  . NO PAST SURGERIES    . PORTACATH PLACEMENT Right 10/05/2016   Procedure: INSERTION PORT-A-CATH;  Surgeon: [name], MD;  Location: [facility] ORS;  Service: General;  Laterality: Right;    SOCIAL HISTORY: Social History   Social History  . Marital status: Single    Spouse name: N/A  . Number of children: N/A  . Years of education: N/A   Occupational History  . Not on file.   Social History Main Topics  . Smoking status: Former Smoker    Packs/day: 1.00    Years: 15.00    Types: Cigarettes    Quit date: 09/28/1992  . Smokeless tobacco: Never Used  . Alcohol use No  . Drug use: No  . Sexual activity: Not on file   Other Topics Concern  . Not on file   Social History Narrative  . No narrative on file    FAMILY HISTORY Family History  Problem Relation Age of Onset  . Lymphoma Mother   . Diabetes Mother   . Lung [name] Sister   . Lung [name] Brother   . Lung [name] Brother     ALLERGIES:  has No Known Allergies.  MEDICATIONS:  Current Outpatient Prescriptions  Medication Sig Dispense Refill  . aspirin EC 81 MG tablet Take 81 mg by mouth at bedtime.    . Aspirin-Caffeine (BAYER BACK & BODY PO) Take 1 tablet by mouth  daily as needed (pain).     . chlorpheniramine-HYDROcodone (TUSSIONEX PENNKINETIC ER) 10-8 MG/5ML SUER Take 5 mLs by mouth every 12 (twelve) hours as needed. (Patient taking differently: Take 5 mLs by mouth every 12 (twelve) hours as needed for cough. ) 115 mL 0  . Cholecalciferol (VITAMIN D3) 2000 units capsule Take 1 capsule (2,000 Units total) by mouth daily.    . cyanocobalamin 500 MCG tablet Take 500 mcg by mouth daily.    Marland Kitchen dexamethasone (DECADRON) 4 MG tablet Take 2 tablets (8 mg total) by mouth daily. Start the day after chemotherapy for 2 days. 30  tablet 1  . lidocaine-prilocaine (EMLA) cream Apply to affected area once 30 g 3  . mupirocin ointment (BACTROBAN) 2 % Apply 1 application topically 2 (two) times daily. 22 g 0  . omeprazole (PRILOSEC) 40 MG capsule Take 1 capsule (40 mg total) by mouth daily. 30 capsule 3  . ondansetron (ZOFRAN) 8 MG tablet Take 1 tablet (8 mg total) by mouth 2 (two) times daily as needed for refractory nausea / vomiting. Start on day 3 after chemo. 30 tablet 1  . prochlorperazine (COMPAZINE) 10 MG tablet Take 1 tablet (10 mg total) by mouth every 6 (six) hours as needed (Nausea or vomiting). 30 tablet 1  . Red Yeast Rice Extract (RED YEAST RICE PO) Take 1 tablet by mouth daily.     Marland Kitchen terazosin (HYTRIN) 1 MG capsule Take 1 mg by mouth at bedtime.     No current facility-administered medications for this visit.    Facility-Administered Medications Ordered in Other Visits  Medication Dose Route Frequency Provider Last Rate Last Dose  . CARBOplatin (PARAPLATIN) 260 mg in sodium chloride 0.9 % 250 mL chemo infusion  260 mg Intravenous Once Earlie Server, MD 552 mL/hr at 10/21/16 1259 260 mg at 10/21/16 1259  . heparin lock flush 100 unit/mL  500 Units Intravenous Once Earlie Server, MD      . sodium chloride flush (NS) 0.9 % injection 10 mL  10 mL Intravenous PRN Earlie Server, MD   10 mL at 10/21/16 0911    PHYSICAL EXAMINATION:  ECOG PERFORMANCE STATUS: 0 - Asymptomatic   Vitals:   10/21/16 1417  BP: (!) 165/91  Pulse: 71  Resp: 20  Temp: (!) 97.4 F (36.3 C)  SpO2: 98%    Filed Weights   10/21/16 1417  Weight: 259 lb 6.4 oz (117.7 kg)     Physical Exam GENERAL: No distress, well nourished.  SKIN:  No rashes or significant lesions  HEAD: Normocephalic, No masses, lesions, tenderness or abnormalities  EYES: Conjunctiva are pink, non icteric ENT: External ears normal ,lips , buccal mucosa, and tongue normal and mucous membranes are moist  LYMPH: No palpable cervical and axillary lymphadenopathy  LUNGS:  Clear to auscultation, no crackles or wheezes HEART: Regular rate & rhythm, no murmurs, no gallops, S1 normal and S2 normal  ABDOMEN: Abdomen soft, non-tender, normal bowel sounds, I did not appreciate any  masses or organomegaly  MUSCULOSKELETAL: No CVA tenderness and no tenderness on percussion of the back or rib cage.  EXTREMITIES: No edema, no skin discoloration or tenderness NEURO: Alert & oriented, no focal motor/sensory deficits.   LABORATORY DATA: I have personally reviewed the data as listed: CBC    Component Value Date/Time   WBC 8.2 10/21/2016 0921   RBC 5.10 10/21/2016 0921   HGB 14.4 10/21/2016 0921   HCT 43.5 10/21/2016 0921   PLT 217 10/21/2016 0921  MCV 85.1 10/21/2016 0921   MCH 28.3 10/21/2016 0921   MCHC 33.2 10/21/2016 0921   RDW 14.3 10/21/2016 0921   LYMPHSABS 1.0 10/21/2016 0921   MONOABS 0.6 10/21/2016 0921   EOSABS 0.2 10/21/2016 0921   BASOSABS 0.0 10/21/2016 0921    CMP Latest Ref Rng & Units 10/21/2016 09/28/2016 09/07/2016  Glucose 65 - 99 mg/dL 99 77 65  BUN 6 - 20 mg/dL _0 Creatinine 0.61 - 1.24 mg/dL 1.10 1.27(H) 1.18  Sodium 135 - 145 mmol/L 136 137 136  Potassium 3.5 - 5.1 mmol/L 3.8 4.2 4.8  Chloride 101 - 111 mmol/L 105 106 101  CO2 22 - 32 mmol/L _1 Calcium 8.9 - 10.3 mg/dL 8.6(L) 8.7(L) 9.0  Total Protein 6.5 - 8.1 g/dL 7.5 7.3 7.5  Total Bilirubin 0.3 - 1.2 mg/dL 1.2 1.2 1.1  Alkaline Phos 38 - 126 U/L 52 50 57  AST 15 - 41 U/L _2 ALT 17 - 63 U/L 15(L) 19 20    RADIOGRAPHIC STUDIES: I have personally reviewed the radiological images as listed and agree with the findings in the report CT chest w contrast 08/31/2016 IMPRESSION: 1. Left peripheral upper lobe mass measuring up to 3.5 cm, likely primary lung [name]. 2. 8 mm satellite nodule just anterior to the mass. 3. Aortopulmonary window lymphadenopathy, possibly metastatic. 4. Nonspecific 6 mm right lower lobe pulmonary nodule. 5. Moderate emphysema and mild  peripheral basilar fibrosis  PET scan 09/06/2016 IMPRESSION: Hypermetabolic 3.4 cm left upper lobe mass with adjacent sub-cm satellite nodule, consistent with primary bronchogenic carcinoma. Hypermetabolic left hilar and mediastinal AP window lymphadenopathy, consistent with metastatic disease.No evidence of distant metastatic disease.  Pathology 09/22/2016 Surgical Pathology  CASE: [id number]  PATIENT: [name]  Surgical Pathology Report  SPECIMEN SUBMITTED:  A. Lung, LUL pulmonary mass  DIAGNOSIS:  A. LUNG MASS, LEFT UPPER LOBE; CT GUIDED BIOPSY:  - SQUAMOUS CELL CARCINOMA.   ASSESSMENT/PLAN [name] Staging Squamous cell lung [name], left [facility]) Staging form: Lung, AJCC 8th Edition - Clinical stage from 09/27/2016: Stage IIIA (cT2a, cN2, cM0) - Signed by Earlie Server, MD on 09/29/2016  1. Squamous cell lung [name], left ([place])   2. Encounter for antineoplastic chemotherapy   . # Ok to start on concurrent chemotherapy with RT followed by consolidation with chemotherapy and maintenance with immunotherapy.  Plan Regimen for chemotherapy will be Carbo AUC 2 and Taxol 23m/m2 weekly with RT, followed by 2 cycles of carboplatin AUC = 6, paclitaxel 200 mg/m2, and followed by immunotherapy.Patient has attend chemotherapy classes. Side effects were discussed with patient.  # Proceed cycle 1 treatment today.  # He has Antiemetics-Zofran and Compazine; EMLA cream. I went over patient's medications with patient and her wife.  I will see him on Day 1 of his cycle 2 weekly concurrent chemotherapy w labs.   Orders Placed This Encounter  Procedures  . CBC with Differential/Platelet    Standing Status:   Standing    Number of Occurrences:   20    Standing Expiration Date:   10/21/2017  . Comprehensive metabolic panel    Standing Status:   Standing    Number of Occurrences:   20    Standing Expiration Date:   10/21/2017    All questions were answered. The patient knows to call the clinic with  any problems, questions or concerns.   ZEarlie Server MD  10/21/2016 1:28 PM

## 2016-10-24 ENCOUNTER — Other Ambulatory Visit: Payer: Self-pay | Admitting: *Deleted

## 2016-10-24 ENCOUNTER — Inpatient Hospital Stay: Payer: Medicare HMO

## 2016-10-24 ENCOUNTER — Ambulatory Visit
Admission: RE | Admit: 2016-10-24 | Discharge: 2016-10-24 | Disposition: A | Discharge status: Home / Self Care | Payer: Medicare HMO | Source: Ambulatory Visit | Attending: Radiation Oncology | Admitting: Radiation Oncology

## 2016-10-24 DIAGNOSIS — Z5111 Encounter for antineoplastic chemotherapy: Secondary | ICD-10-CM | POA: Diagnosis not present

## 2016-10-24 DIAGNOSIS — Z510 Encounter for antineoplastic radiation therapy: Secondary | ICD-10-CM | POA: Diagnosis not present

## 2016-10-24 DIAGNOSIS — C3412 Malignant neoplasm of upper lobe, left bronchus or lung: Secondary | ICD-10-CM

## 2016-10-24 LAB — CBC
HCT: 43.2 % (ref 40.0–52.0)
HEMOGLOBIN: 14.4 g/dL (ref 13.0–18.0)
MCH: 28.3 pg (ref 26.0–34.0)
MCHC: 33.3 g/dL (ref 32.0–36.0)
MCV: 85 fL (ref 80.0–100.0)
Platelets: 199 10*3/uL (ref 150–440)
RBC: 5.08 MIL/uL (ref 4.40–5.90)
RDW: 13.7 % (ref 11.5–14.5)
WBC: 12.4 10*3/uL — ABNORMAL HIGH (ref 3.8–10.6)

## 2016-10-25 ENCOUNTER — Ambulatory Visit
Admission: RE | Admit: 2016-10-25 | Discharge: 2016-10-25 | Disposition: A | Discharge status: Home / Self Care | Payer: Medicare HMO | Source: Ambulatory Visit | Attending: Radiation Oncology | Admitting: Radiation Oncology

## 2016-10-25 DIAGNOSIS — Z510 Encounter for antineoplastic radiation therapy: Secondary | ICD-10-CM | POA: Diagnosis not present

## 2016-10-26 ENCOUNTER — Ambulatory Visit
Admission: RE | Admit: 2016-10-26 | Discharge: 2016-10-26 | Disposition: A | Discharge status: Home / Self Care | Payer: Medicare HMO | Source: Ambulatory Visit | Attending: Radiation Oncology | Admitting: Radiation Oncology

## 2016-10-26 DIAGNOSIS — Z510 Encounter for antineoplastic radiation therapy: Secondary | ICD-10-CM | POA: Diagnosis not present

## 2016-10-27 ENCOUNTER — Ambulatory Visit
Admission: RE | Admit: 2016-10-27 | Discharge: 2016-10-27 | Disposition: A | Discharge status: Home / Self Care | Payer: Medicare HMO | Source: Ambulatory Visit | Attending: Radiation Oncology | Admitting: Radiation Oncology

## 2016-10-27 DIAGNOSIS — Z510 Encounter for antineoplastic radiation therapy: Secondary | ICD-10-CM | POA: Diagnosis not present

## 2016-10-28 ENCOUNTER — Inpatient Hospital Stay: Payer: Medicare HMO

## 2016-10-28 ENCOUNTER — Encounter: Payer: Self-pay | Admitting: Oncology

## 2016-10-28 ENCOUNTER — Inpatient Hospital Stay (HOSPITAL_BASED_OUTPATIENT_CLINIC_OR_DEPARTMENT_OTHER): Payer: Medicare HMO | Admitting: Oncology

## 2016-10-28 ENCOUNTER — Ambulatory Visit
Admission: RE | Admit: 2016-10-28 | Discharge: 2016-10-28 | Disposition: A | Discharge status: Home / Self Care | Payer: Medicare HMO | Source: Ambulatory Visit | Attending: Radiation Oncology | Admitting: Radiation Oncology

## 2016-10-28 VITALS — BP 123/70 | HR 80 | Resp 18

## 2016-10-28 VITALS — BP 124/74 | HR 81 | Temp 95.5°F | Wt 262.2 lb

## 2016-10-28 DIAGNOSIS — N400 Enlarged prostate without lower urinary tract symptoms: Secondary | ICD-10-CM

## 2016-10-28 DIAGNOSIS — Z801 Family history of malignant neoplasm of trachea, bronchus and lung: Secondary | ICD-10-CM

## 2016-10-28 DIAGNOSIS — C3492 Malignant neoplasm of unspecified part of left bronchus or lung: Secondary | ICD-10-CM

## 2016-10-28 DIAGNOSIS — Z87891 Personal history of nicotine dependence: Secondary | ICD-10-CM

## 2016-10-28 DIAGNOSIS — R042 Hemoptysis: Secondary | ICD-10-CM | POA: Diagnosis not present

## 2016-10-28 DIAGNOSIS — J439 Emphysema, unspecified: Secondary | ICD-10-CM

## 2016-10-28 DIAGNOSIS — Z79899 Other long term (current) drug therapy: Secondary | ICD-10-CM | POA: Diagnosis not present

## 2016-10-28 DIAGNOSIS — R05 Cough: Secondary | ICD-10-CM

## 2016-10-28 DIAGNOSIS — Z7982 Long term (current) use of aspirin: Secondary | ICD-10-CM | POA: Diagnosis not present

## 2016-10-28 DIAGNOSIS — Z5111 Encounter for antineoplastic chemotherapy: Secondary | ICD-10-CM | POA: Diagnosis not present

## 2016-10-28 DIAGNOSIS — C3412 Malignant neoplasm of upper lobe, left bronchus or lung: Secondary | ICD-10-CM

## 2016-10-28 DIAGNOSIS — Z510 Encounter for antineoplastic radiation therapy: Secondary | ICD-10-CM | POA: Diagnosis not present

## 2016-10-28 LAB — CBC WITH DIFFERENTIAL/PLATELET
Basophils Absolute: 0 10*3/uL (ref 0–0.1)
Basophils Relative: 0 %
EOS ABS: 0.2 10*3/uL (ref 0–0.7)
EOS PCT: 3 %
HCT: 39.8 % — ABNORMAL LOW (ref 40.0–52.0)
Hemoglobin: 13.4 g/dL (ref 13.0–18.0)
LYMPHS ABS: 0.5 10*3/uL — AB (ref 1.0–3.6)
Lymphocytes Relative: 7 %
MCH: 28.3 pg (ref 26.0–34.0)
MCHC: 33.8 g/dL (ref 32.0–36.0)
MCV: 83.7 fL (ref 80.0–100.0)
Monocytes Absolute: 0.4 10*3/uL (ref 0.2–1.0)
Monocytes Relative: 6 %
Neutro Abs: 6.1 10*3/uL (ref 1.4–6.5)
Neutrophils Relative %: 84 %
PLATELETS: 189 10*3/uL (ref 150–440)
RBC: 4.75 MIL/uL (ref 4.40–5.90)
RDW: 13.6 % (ref 11.5–14.5)
WBC: 7.2 10*3/uL (ref 3.8–10.6)

## 2016-10-28 LAB — COMPREHENSIVE METABOLIC PANEL
ALT: 15 U/L — AB (ref 17–63)
AST: 16 U/L (ref 15–41)
Albumin: 3.3 g/dL — ABNORMAL LOW (ref 3.5–5.0)
Alkaline Phosphatase: 43 U/L (ref 38–126)
Anion gap: 5 (ref 5–15)
BUN: 13 mg/dL (ref 6–20)
CHLORIDE: 106 mmol/L (ref 101–111)
CO2: 24 mmol/L (ref 22–32)
CREATININE: 1.1 mg/dL (ref 0.61–1.24)
Calcium: 8.7 mg/dL — ABNORMAL LOW (ref 8.9–10.3)
GFR calc non Af Amer: >60 mL/min (ref >60)
Glucose, Bld: 94 mg/dL (ref 65–99)
Potassium: 3.9 mmol/L (ref 3.5–5.1)
SODIUM: 135 mmol/L (ref 135–145)
Total Bilirubin: 0.9 mg/dL (ref 0.3–1.2)
Total Protein: 6.8 g/dL (ref 6.5–8.1)

## 2016-10-28 MED ORDER — SODIUM CHLORIDE 0.9 % IV SOLN
Freq: Once | INTRAVENOUS | Status: AC
  Administered 2016-10-28: 10:00:00 via INTRAVENOUS
  Filled 2016-10-28: qty 1000

## 2016-10-28 MED ORDER — HEPARIN SOD (PORK) LOCK FLUSH 100 UNIT/ML IV SOLN
500.0000 [IU] | Freq: Once | INTRAVENOUS | Status: AC | PRN
  Administered 2016-10-28: 500 [IU]
  Filled 2016-10-28: qty 5

## 2016-10-28 MED ORDER — PALONOSETRON HCL INJECTION 0.25 MG/5ML
0.2500 mg | Freq: Once | INTRAVENOUS | Status: AC
  Administered 2016-10-28: 0.25 mg via INTRAVENOUS
  Filled 2016-10-28: qty 5

## 2016-10-28 MED ORDER — DIPHENHYDRAMINE HCL 50 MG/ML IJ SOLN
50.0000 mg | Freq: Once | INTRAMUSCULAR | Status: AC
  Administered 2016-10-28: 50 mg via INTRAVENOUS
  Filled 2016-10-28: qty 1

## 2016-10-28 MED ORDER — SODIUM CHLORIDE 0.9 % IV SOLN
260.0000 mg | Freq: Once | INTRAVENOUS | Status: AC
  Administered 2016-10-28: 260 mg via INTRAVENOUS
  Filled 2016-10-28: qty 26

## 2016-10-28 MED ORDER — FAMOTIDINE IN NACL 20-0.9 MG/50ML-% IV SOLN
20.0000 mg | Freq: Once | INTRAVENOUS | Status: AC
  Administered 2016-10-28: 20 mg via INTRAVENOUS
  Filled 2016-10-28: qty 50

## 2016-10-28 MED ORDER — SODIUM CHLORIDE 0.9 % IV SOLN
20.0000 mg | Freq: Once | INTRAVENOUS | Status: AC
  Administered 2016-10-28: 20 mg via INTRAVENOUS
  Filled 2016-10-28: qty 2

## 2016-10-28 MED ORDER — PACLITAXEL CHEMO INJECTION 300 MG/50ML
45.0000 mg/m2 | Freq: Once | INTRAVENOUS | Status: AC
  Administered 2016-10-28: 114 mg via INTRAVENOUS
  Filled 2016-10-28: qty 19

## 2016-10-28 NOTE — Progress Notes (Signed)
[place] [facility] Initial Visit:  Patient Care Team: Adline Potter, MD as PCP - General (Family Medicine) Telford Nab, RN as Registered Nurse  CHIEF COMPLAINTS/PURPOSE OF CONSULTATION: Here for newly diagnosed lung cancer.  HISTORY OF PRESENTING ILLNESS: 1  [name] 71 y.o. African American male previous healthy, former smoker who is referred to me for evaluation and management of newly diagnosed lung cancer. He gets his usual care at [facility]. He presented last month to urgent care due to coughing and hemoptysis. X ray showed left lung mass. Cough up small amount of blood mixed in the sputum. Denies SOB, chest pain, back pain, abdominal pain. Denies headache, vision changes. He works a Administrator.   2 Images: CT showed LUL mass with a satellite lesion as well as mediastinal adenopathy.  PET was done which showed hypermetabolic left upper lobe mass as well as the adjacent nodule, ipsilateral hilar and mediastinal lymphadenopathy. CT guided left lung mass biopsy showed squamous lung carcinoma.  MRI brain showed no intracranial mets.    3 Pathology:  09/22/2016 left upper lobe biopsy showed squamous cell carcinoma.  Foundation one test: ordered prior to patient first visit. Showed NO EGFR, ROS1, ALK, BRAF, MET mutation. TPS 50%.    INTERVAL HISTORY Patient presents for evaluation before cycle 2 weekly carbo Taxol concurrent RT for stage III lung squamous cell. He has no complaints. Still have coughing spells. He denies fatigue, SOB,nausea/vomiting,  hemoptysis, abdominal pain. He still works part time as Administrator.   Review of Systems  Constitutional: Negative.   HENT:  Negative.   Eyes: Negative.   Respiratory: Positive for cough.   Cardiovascular: Negative.   Gastrointestinal: Negative.   Endocrine: Negative.   Genitourinary: Negative.    Musculoskeletal: Negative.   Skin: Negative.   Neurological: Negative.   Hematological: Negative.   Psychiatric/Behavioral:  Negative.     MEDICAL HISTORY: Past Medical History:  Diagnosis Date  . Enlarged prostate     SURGICAL HISTORY: Past Surgical History:  Procedure Laterality Date  . NO PAST SURGERIES    . PORTACATH PLACEMENT Right 10/05/2016   Procedure: INSERTION PORT-A-CATH;  Surgeon: [name], MD;  Location: [facility] ORS;  Service: General;  Laterality: Right;    SOCIAL HISTORY: Social History   Social History  . Marital status: Single    Spouse name: N/A  . Number of children: N/A  . Years of education: N/A   Occupational History  . Not on file.   Social History Main Topics  . Smoking status: Former Smoker    Packs/day: 1.00    Years: 15.00    Types: Cigarettes    Quit date: 09/28/1992  . Smokeless tobacco: Never Used  . Alcohol use No  . Drug use: No  . Sexual activity: Not on file   Other Topics Concern  . Not on file   Social History Narrative  . No narrative on file    FAMILY HISTORY Family History  Problem Relation Age of Onset  . Lymphoma Mother   . Diabetes Mother   . Lung cancer Sister   . Lung cancer Brother   . Lung cancer Brother     ALLERGIES:  has No Known Allergies.  MEDICATIONS:  Current Outpatient Prescriptions  Medication Sig Dispense Refill  . chlorpheniramine-HYDROcodone (TUSSIONEX PENNKINETIC ER) 10-8 MG/5ML SUER Take 5 mLs by mouth every 12 (twelve) hours as needed. (Patient taking differently: Take 5 mLs by mouth every 12 (twelve) hours as needed for cough. )  115 mL 0  . Cholecalciferol (VITAMIN D3) 2000 units capsule Take 1 capsule (2,000 Units total) by mouth daily.    . cyanocobalamin 500 MCG tablet Take 500 mcg by mouth daily.    Marland Kitchen dexamethasone (DECADRON) 4 MG tablet Take 2 tablets (8 mg total) by mouth daily. Start the day after chemotherapy for 2 days. 30 tablet 1  . lidocaine-prilocaine (EMLA) cream Apply to affected area once 30 g 3  . mupirocin ointment (BACTROBAN) 2 % Apply 1 application topically 2 (two) times daily as needed.  22 g 2  . omeprazole (PRILOSEC) 40 MG capsule Take 1 capsule (40 mg total) by mouth daily. 30 capsule 3  . ondansetron (ZOFRAN) 8 MG tablet Take 1 tablet (8 mg total) by mouth 2 (two) times daily as needed for refractory nausea / vomiting. Start on day 3 after chemo. 30 tablet 1  . prochlorperazine (COMPAZINE) 10 MG tablet Take 1 tablet (10 mg total) by mouth every 6 (six) hours as needed (Nausea or vomiting). 30 tablet 1   No current facility-administered medications for this visit.    Facility-Administered Medications Ordered in Other Visits  Medication Dose Route Frequency Provider Last Rate Last Dose  . CARBOplatin (PARAPLATIN) 260 mg in sodium chloride 0.9 % 250 mL chemo infusion  260 mg Intravenous Once Earlie Server, MD 552 mL/hr at 10/28/16 1149 260 mg at 10/28/16 1149  . heparin lock flush 100 unit/mL  500 Units Intracatheter Once PRN Earlie Server, MD        PHYSICAL EXAMINATION:  ECOG PERFORMANCE STATUS: 0 - Asymptomatic   Vitals:   10/28/16 0855  BP: 124/74  Pulse: 81  Temp: (!) 95.5 F (35.3 C)    Filed Weights   10/28/16 0855  Weight: 262 lb 3 oz (118.9 kg)     Physical Exam GENERAL: No distress, well nourished.  SKIN:  No rashes or significant lesions  HEAD: Normocephalic, No masses, lesions, tenderness or abnormalities  EYES: Conjunctiva are pink, non icteric ENT: External ears normal ,lips , buccal mucosa, and tongue normal and mucous membranes are moist  LYMPH: No palpable cervical and axillary lymphadenopathy  LUNGS: Clear to auscultation, no crackles or wheezes HEART: Regular rate & rhythm, no murmurs, no gallops, S1 normal and S2 normal  ABDOMEN: Abdomen soft, non-tender, normal bowel sounds, I did not appreciate any  masses or organomegaly  MUSCULOSKELETAL: No CVA tenderness and no tenderness on percussion of the back or rib cage.  EXTREMITIES: No edema, no skin discoloration or tenderness NEURO: Alert & oriented, no focal motor/sensory  deficits.   LABORATORY DATA: I have personally reviewed the data as listed: CBC    Component Value Date/Time   WBC 7.2 10/28/2016 0813   RBC 4.75 10/28/2016 0813   HGB 13.4 10/28/2016 0813   HCT 39.8 (L) 10/28/2016 0813   PLT 189 10/28/2016 0813   MCV 83.7 10/28/2016 0813   MCH 28.3 10/28/2016 0813   MCHC 33.8 10/28/2016 0813   RDW 13.6 10/28/2016 0813   LYMPHSABS 0.5 (L) 10/28/2016 0813   MONOABS 0.4 10/28/2016 0813   EOSABS 0.2 10/28/2016 0813   BASOSABS 0.0 10/28/2016 0813    CMP Latest Ref Rng & Units 10/28/2016 10/21/2016 09/28/2016  Glucose 65 - 99 mg/dL 94 99 77  BUN 6 - 20 mg/dL '13 10 13  ' Creatinine 0.61 - 1.24 mg/dL 1.10 1.10 1.27(H)  Sodium 135 - 145 mmol/L 135 136 137  Potassium 3.5 - 5.1 mmol/L 3.9 3.8 4.2  Chloride 101 -  111 mmol/L 106 105 106  CO2 22 - 32 mmol/L '24 24 27  ' Calcium 8.9 - 10.3 mg/dL 8.7(L) 8.6(L) 8.7(L)  Total Protein 6.5 - 8.1 g/dL 6.8 7.5 7.3  Total Bilirubin 0.3 - 1.2 mg/dL 0.9 1.2 1.2  Alkaline Phos 38 - 126 U/L 43 52 50  AST 15 - 41 U/L '16 17 21  ' ALT 17 - 63 U/L 15(L) 15(L) 19    RADIOGRAPHIC STUDIES: I have personally reviewed the radiological images as listed and agree with the findings in the report CT chest w contrast 08/31/2016 IMPRESSION: 1. Left peripheral upper lobe mass measuring up to 3.5 cm, likely primary lung cancer. 2. 8 mm satellite nodule just anterior to the mass. 3. Aortopulmonary window lymphadenopathy, possibly metastatic. 4. Nonspecific 6 mm right lower lobe pulmonary nodule. 5. Moderate emphysema and mild peripheral basilar fibrosis  PET scan 09/06/2016 IMPRESSION: Hypermetabolic 3.4 cm left upper lobe mass with adjacent sub-cm satellite nodule, consistent with primary bronchogenic carcinoma. Hypermetabolic left hilar and mediastinal AP window lymphadenopathy, consistent with metastatic disease.No evidence of distant metastatic disease.  Pathology 09/22/2016 Surgical Pathology  CASE: [id number]  PATIENT:  [name]  Surgical Pathology Report  SPECIMEN SUBMITTED:  A. Lung, LUL pulmonary mass  DIAGNOSIS:  A. LUNG MASS, LEFT UPPER LOBE; CT GUIDED BIOPSY:  - SQUAMOUS CELL CARCINOMA.   ASSESSMENT/PLAN Cancer Staging Squamous cell lung cancer, left [facility]) Staging form: Lung, AJCC 8th Edition - Clinical stage from 09/27/2016: Stage IIIA (cT2a, cN2, cM0) - Signed by Earlie Server, MD on 09/29/2016  1. Malignant neoplasm of upper lobe of left lung ([place])   2. Squamous cell lung cancer, left ([place])   3. Encounter for antineoplastic chemotherapy   . # Ok to start on concurrent chemotherapy with RT followed by consolidation with chemotherapy and maintenance with immunotherapy.  Plan Regimen for chemotherapy will be Carbo AUC 2 and Taxol 70m/m2 weekly with RT, followed by 2 cycles of carboplatin AUC = 6, paclitaxel 200 mg/m2, and followed by immunotherapy..  # Proceed cycle 2 treatment today.  # Follow up Radonc to complete RT course.  # He has Antiemetics-Zofran and Compazine; EMLA cream.  I will see him on Day 1 of his cycle 3 weekly concurrent chemotherapy w labs. No orders of the defined types were placed in this encounter.   All questions were answered. The patient knows to call the clinic with any problems, questions or concerns.   ZEarlie Server MD  10/28/2016 12:11 PM

## 2016-10-28 NOTE — Progress Notes (Signed)
Patient here today for follow up.   

## 2016-10-31 ENCOUNTER — Ambulatory Visit: Admission: RE | Admit: 2016-10-31 | Payer: Medicare HMO | Source: Ambulatory Visit

## 2016-11-01 ENCOUNTER — Ambulatory Visit: Admission: RE | Admit: 2016-11-01 | Payer: Medicare HMO | Source: Ambulatory Visit

## 2016-11-01 ENCOUNTER — Ambulatory Visit
Admission: RE | Admit: 2016-11-01 | Discharge: 2016-11-01 | Disposition: A | Discharge status: Home / Self Care | Payer: Medicare HMO | Source: Ambulatory Visit | Attending: Radiation Oncology | Admitting: Radiation Oncology

## 2016-11-01 DIAGNOSIS — Z510 Encounter for antineoplastic radiation therapy: Secondary | ICD-10-CM | POA: Diagnosis not present

## 2016-11-02 ENCOUNTER — Ambulatory Visit
Admission: RE | Admit: 2016-11-02 | Discharge: 2016-11-02 | Disposition: A | Discharge status: Home / Self Care | Payer: Medicare HMO | Source: Ambulatory Visit | Attending: Radiation Oncology | Admitting: Radiation Oncology

## 2016-11-02 DIAGNOSIS — Z510 Encounter for antineoplastic radiation therapy: Secondary | ICD-10-CM | POA: Diagnosis not present

## 2016-11-03 ENCOUNTER — Ambulatory Visit
Admission: RE | Admit: 2016-11-03 | Discharge: 2016-11-03 | Disposition: A | Discharge status: Home / Self Care | Payer: Medicare HMO | Source: Ambulatory Visit | Attending: Radiation Oncology | Admitting: Radiation Oncology

## 2016-11-03 DIAGNOSIS — Z510 Encounter for antineoplastic radiation therapy: Secondary | ICD-10-CM | POA: Diagnosis not present

## 2016-11-04 ENCOUNTER — Inpatient Hospital Stay: Payer: Medicare HMO

## 2016-11-04 ENCOUNTER — Ambulatory Visit
Admission: RE | Admit: 2016-11-04 | Discharge: 2016-11-04 | Disposition: A | Discharge status: Home / Self Care | Payer: Medicare HMO | Source: Ambulatory Visit | Attending: Radiation Oncology | Admitting: Radiation Oncology

## 2016-11-04 ENCOUNTER — Inpatient Hospital Stay (HOSPITAL_BASED_OUTPATIENT_CLINIC_OR_DEPARTMENT_OTHER): Payer: Medicare HMO | Admitting: Oncology

## 2016-11-04 ENCOUNTER — Encounter: Payer: Self-pay | Admitting: Oncology

## 2016-11-04 VITALS — BP 168/89 | HR 70 | Temp 96.0°F | Resp 18 | Wt 259.5 lb

## 2016-11-04 DIAGNOSIS — R042 Hemoptysis: Secondary | ICD-10-CM

## 2016-11-04 DIAGNOSIS — Z7982 Long term (current) use of aspirin: Secondary | ICD-10-CM | POA: Diagnosis not present

## 2016-11-04 DIAGNOSIS — C3492 Malignant neoplasm of unspecified part of left bronchus or lung: Secondary | ICD-10-CM

## 2016-11-04 DIAGNOSIS — Z87891 Personal history of nicotine dependence: Secondary | ICD-10-CM | POA: Diagnosis not present

## 2016-11-04 DIAGNOSIS — C3412 Malignant neoplasm of upper lobe, left bronchus or lung: Secondary | ICD-10-CM | POA: Diagnosis not present

## 2016-11-04 DIAGNOSIS — Z5111 Encounter for antineoplastic chemotherapy: Secondary | ICD-10-CM

## 2016-11-04 DIAGNOSIS — J439 Emphysema, unspecified: Secondary | ICD-10-CM | POA: Diagnosis not present

## 2016-11-04 DIAGNOSIS — Z79899 Other long term (current) drug therapy: Secondary | ICD-10-CM | POA: Diagnosis not present

## 2016-11-04 DIAGNOSIS — R05 Cough: Secondary | ICD-10-CM

## 2016-11-04 DIAGNOSIS — Z801 Family history of malignant neoplasm of trachea, bronchus and lung: Secondary | ICD-10-CM | POA: Diagnosis not present

## 2016-11-04 DIAGNOSIS — N400 Enlarged prostate without lower urinary tract symptoms: Secondary | ICD-10-CM

## 2016-11-04 DIAGNOSIS — Z510 Encounter for antineoplastic radiation therapy: Secondary | ICD-10-CM | POA: Diagnosis not present

## 2016-11-04 LAB — COMPREHENSIVE METABOLIC PANEL
ALT: 16 U/L — AB (ref 17–63)
ANION GAP: 6 (ref 5–15)
AST: 17 U/L (ref 15–41)
Albumin: 3.7 g/dL (ref 3.5–5.0)
Alkaline Phosphatase: 42 U/L (ref 38–126)
BUN: 14 mg/dL (ref 6–20)
CHLORIDE: 107 mmol/L (ref 101–111)
CO2: 23 mmol/L (ref 22–32)
Calcium: 8.7 mg/dL — ABNORMAL LOW (ref 8.9–10.3)
Creatinine, Ser: 1.01 mg/dL (ref 0.61–1.24)
Glucose, Bld: 109 mg/dL — ABNORMAL HIGH (ref 65–99)
POTASSIUM: 4.1 mmol/L (ref 3.5–5.1)
SODIUM: 136 mmol/L (ref 135–145)
Total Bilirubin: 1 mg/dL (ref 0.3–1.2)
Total Protein: 7.1 g/dL (ref 6.5–8.1)

## 2016-11-04 LAB — CBC WITH DIFFERENTIAL/PLATELET
Basophils Absolute: 0 10*3/uL (ref 0–0.1)
Basophils Relative: 0 %
EOS ABS: 0.1 10*3/uL (ref 0–0.7)
EOS PCT: 2 %
HCT: 41.2 % (ref 40.0–52.0)
Hemoglobin: 13.9 g/dL (ref 13.0–18.0)
LYMPHS ABS: 0.3 10*3/uL — AB (ref 1.0–3.6)
Lymphocytes Relative: 6 %
MCH: 28.5 pg (ref 26.0–34.0)
MCHC: 33.9 g/dL (ref 32.0–36.0)
MCV: 84.2 fL (ref 80.0–100.0)
MONO ABS: 0.3 10*3/uL (ref 0.2–1.0)
Monocytes Relative: 7 %
Neutro Abs: 4 10*3/uL (ref 1.4–6.5)
Neutrophils Relative %: 85 %
PLATELETS: 183 10*3/uL (ref 150–440)
RBC: 4.89 MIL/uL (ref 4.40–5.90)
RDW: 13.9 % (ref 11.5–14.5)
WBC: 4.7 10*3/uL (ref 3.8–10.6)

## 2016-11-04 MED ORDER — SODIUM CHLORIDE 0.9 % IV SOLN
Freq: Once | INTRAVENOUS | Status: AC
  Administered 2016-11-04: 10:00:00 via INTRAVENOUS
  Filled 2016-11-04: qty 1000

## 2016-11-04 MED ORDER — PACLITAXEL CHEMO INJECTION 300 MG/50ML
45.0000 mg/m2 | Freq: Once | INTRAVENOUS | Status: AC
  Administered 2016-11-04: 114 mg via INTRAVENOUS
  Filled 2016-11-04: qty 19

## 2016-11-04 MED ORDER — PALONOSETRON HCL INJECTION 0.25 MG/5ML
0.2500 mg | Freq: Once | INTRAVENOUS | Status: AC
  Administered 2016-11-04: 0.25 mg via INTRAVENOUS
  Filled 2016-11-04: qty 5

## 2016-11-04 MED ORDER — SODIUM CHLORIDE 0.9% FLUSH
10.0000 mL | INTRAVENOUS | Status: DC | PRN
  Administered 2016-11-04: 10 mL
  Filled 2016-11-04: qty 10

## 2016-11-04 MED ORDER — SODIUM CHLORIDE 0.9 % IV SOLN
20.0000 mg | Freq: Once | INTRAVENOUS | Status: AC
  Administered 2016-11-04: 20 mg via INTRAVENOUS
  Filled 2016-11-04: qty 2

## 2016-11-04 MED ORDER — HEPARIN SOD (PORK) LOCK FLUSH 100 UNIT/ML IV SOLN
500.0000 [IU] | Freq: Once | INTRAVENOUS | Status: AC | PRN
  Administered 2016-11-04: 500 [IU]
  Filled 2016-11-04: qty 5

## 2016-11-04 MED ORDER — FAMOTIDINE IN NACL 20-0.9 MG/50ML-% IV SOLN
20.0000 mg | Freq: Once | INTRAVENOUS | Status: AC
  Administered 2016-11-04: 20 mg via INTRAVENOUS
  Filled 2016-11-04: qty 50

## 2016-11-04 MED ORDER — DIPHENHYDRAMINE HCL 50 MG/ML IJ SOLN
50.0000 mg | Freq: Once | INTRAMUSCULAR | Status: AC
  Administered 2016-11-04: 50 mg via INTRAVENOUS
  Filled 2016-11-04: qty 1

## 2016-11-04 MED ORDER — SODIUM CHLORIDE 0.9 % IV SOLN
260.0000 mg | Freq: Once | INTRAVENOUS | Status: AC
  Administered 2016-11-04: 260 mg via INTRAVENOUS
  Filled 2016-11-04: qty 26

## 2016-11-04 MED ORDER — ZOLPIDEM TARTRATE 5 MG PO TABS: 5.0000 mg | ORAL_TABLET | Freq: Every evening | ORAL | 0 refills | Status: DC | PRN

## 2016-11-04 NOTE — Progress Notes (Signed)
Patient denies any concerns today.  

## 2016-11-04 NOTE — Progress Notes (Signed)
[facility] Initial Visit:  Patient Care Team: Adline Potter, MD as PCP - General (Family Medicine) Telford Nab, RN as Registered Nurse  CHIEF COMPLAINTS/PURPOSE OF CONSULTATION: Here for newly diagnosed lung cancer.  HISTORY OF PRESENTING ILLNESS: 1  [name] 72 y.o. African American male previous healthy, former smoker who is referred to me for evaluation and management of newly diagnosed lung cancer. He gets his usual care at [facility]. He presented last month to urgent care due to coughing and hemoptysis. X ray showed left lung mass. Cough up small amount of blood mixed in the sputum. Denies SOB, chest pain, back pain, abdominal pain. Denies headache, vision changes. He works a Administrator.   2 Images: CT showed LUL mass with a satellite lesion as well as mediastinal adenopathy.  PET was done which showed hypermetabolic left upper lobe mass as well as the adjacent nodule, ipsilateral hilar and mediastinal lymphadenopathy. CT guided left lung mass biopsy showed squamous lung carcinoma.  MRI brain showed no intracranial mets.    3 Pathology:  09/22/2016 left upper lobe biopsy showed squamous cell carcinoma.  Foundation one test: ordered prior to patient first visit. Showed NO EGFR, ROS1, ALK, BRAF, MET mutation. TPS 50%.    INTERVAL HISTORY Patient presents for evaluation before cycle 3 weekly carbo Taxol concurrent RT for stage III lung squamous cell.  He has no complaints except not sleeping well. He tolerated treatment well, cough occasionally.   He denies fatigue, SOB,nausea/vomiting,  hemoptysis, abdominal pain. He still works part time as Administrator.   Review of Systems  Constitutional: Negative.   HENT:  Negative.   Eyes: Negative.   Respiratory: Positive for cough.   Cardiovascular: Negative.   Gastrointestinal: Negative.   Endocrine: Negative.   Genitourinary: Negative.    Musculoskeletal: Negative.   Skin: Negative.   Neurological: Negative.    Hematological: Negative.   Psychiatric/Behavioral: Negative.     MEDICAL HISTORY: Past Medical History:  Diagnosis Date  . Enlarged prostate     SURGICAL HISTORY: Past Surgical History:  Procedure Laterality Date  . NO PAST SURGERIES    . PORTACATH PLACEMENT Right 10/05/2016   Procedure: INSERTION PORT-A-CATH;  Surgeon: [name], MD;  Location: [facility] ORS;  Service: General;  Laterality: Right;    SOCIAL HISTORY: Social History   Social History  . Marital status: Single    Spouse name: N/A  . Number of children: N/A  . Years of education: N/A   Occupational History  . Not on file.   Social History Main Topics  . Smoking status: Former Smoker    Packs/day: 1.00    Years: 15.00    Types: Cigarettes    Quit date: 09/28/1992  . Smokeless tobacco: Never Used  . Alcohol use No  . Drug use: No  . Sexual activity: Not on file   Other Topics Concern  . Not on file   Social History Narrative  . No narrative on file    FAMILY HISTORY Family History  Problem Relation Age of Onset  . Lymphoma Mother   . Diabetes Mother   . Lung cancer Sister   . Lung cancer Brother   . Lung cancer Brother     ALLERGIES:  has No Known Allergies.  MEDICATIONS:  Current Outpatient Prescriptions  Medication Sig Dispense Refill  . chlorpheniramine-HYDROcodone (TUSSIONEX PENNKINETIC ER) 10-8 MG/5ML SUER Take 5 mLs by mouth every 12 (twelve) hours as needed. (Patient taking differently: Take 5 mLs by  mouth every 12 (twelve) hours as needed for cough. ) 115 mL 0  . Cholecalciferol (VITAMIN D3) 2000 units capsule Take 1 capsule (2,000 Units total) by mouth daily.    . cyanocobalamin 500 MCG tablet Take 500 mcg by mouth daily.    Marland Kitchen dexamethasone (DECADRON) 4 MG tablet Take 2 tablets (8 mg total) by mouth daily. Start the day after chemotherapy for 2 days. 30 tablet 1  . lidocaine-prilocaine (EMLA) cream Apply to affected area once 30 g 3  . mupirocin ointment (BACTROBAN) 2 % Apply 1  application topically 2 (two) times daily as needed. 22 g 2  . omeprazole (PRILOSEC) 40 MG capsule Take 1 capsule (40 mg total) by mouth daily. 30 capsule 3  . ondansetron (ZOFRAN) 8 MG tablet Take 1 tablet (8 mg total) by mouth 2 (two) times daily as needed for refractory nausea / vomiting. Start on day 3 after chemo. 30 tablet 1  . prochlorperazine (COMPAZINE) 10 MG tablet Take 1 tablet (10 mg total) by mouth every 6 (six) hours as needed (Nausea or vomiting). 30 tablet 1  . zolpidem (AMBIEN) 5 MG tablet Take 1 tablet (5 mg total) by mouth at bedtime as needed for sleep. 30 tablet 0   No current facility-administered medications for this visit.    Facility-Administered Medications Ordered in Other Visits  Medication Dose Route Frequency Provider Last Rate Last Dose  . CARBOplatin (PARAPLATIN) 260 mg in sodium chloride 0.9 % 250 mL chemo infusion  260 mg Intravenous Once Earlie Server, MD      . heparin lock flush 100 unit/mL  500 Units Intracatheter Once PRN Earlie Server, MD      . PACLitaxel (TAXOL) 114 mg in dextrose 5 % 250 mL chemo infusion (</= 32m/m2)  45 mg/m2 (Treatment Plan Recorded) Intravenous Once YEarlie Server MD 269 mL/hr at 11/04/16 1040 114 mg at 11/04/16 1040  . sodium chloride flush (NS) 0.9 % injection 10 mL  10 mL Intracatheter PRN YEarlie Server MD   10 mL at 11/04/16 0940    PHYSICAL EXAMINATION:  ECOG PERFORMANCE STATUS: 0 - Asymptomatic   Vitals:   11/04/16 0905  BP: (!) 168/89  Pulse: 70  Resp: 18  Temp: (!) 96 F (35.6 C)    Filed Weights   11/04/16 0905  Weight: 259 lb 8 oz (117.7 kg)     Physical Exam GENERAL: No distress, well nourished.  SKIN:  No rashes or significant lesions  HEAD: Normocephalic, No masses, lesions, tenderness or abnormalities  EYES: Conjunctiva are pink, non icteric ENT: External ears normal ,lips , buccal mucosa, and tongue normal and mucous membranes are moist  LYMPH: No palpable cervical and axillary lymphadenopathy  LUNGS: Clear to  auscultation, no crackles or wheezes HEART: Regular rate & rhythm, no murmurs, no gallops, S1 normal and S2 normal  ABDOMEN: Abdomen soft, non-tender, normal bowel sounds, I did not appreciate any  masses or organomegaly  MUSCULOSKELETAL: No CVA tenderness and no tenderness on percussion of the back or rib cage.  EXTREMITIES: No edema, no skin discoloration or tenderness NEURO: Alert & oriented, no focal motor/sensory deficits.   LABORATORY DATA: I have personally reviewed the data as listed: CBC    Component Value Date/Time   WBC 4.7 11/04/2016 0826   RBC 4.89 11/04/2016 0826   HGB 13.9 11/04/2016 0826   HCT 41.2 11/04/2016 0826   PLT 183 11/04/2016 0826   MCV 84.2 11/04/2016 0826   MCH 28.5 11/04/2016 0826   MCHC  33.9 11/04/2016 0826   RDW 13.9 11/04/2016 0826   LYMPHSABS 0.3 (L) 11/04/2016 0826   MONOABS 0.3 11/04/2016 0826   EOSABS 0.1 11/04/2016 0826   BASOSABS 0.0 11/04/2016 0826    CMP Latest Ref Rng & Units 11/04/2016 10/28/2016 10/21/2016  Glucose 65 - 99 mg/dL 109(H) 94 99  BUN 6 - 20 mg/dL _0 Creatinine 0.61 - 1.24 mg/dL 1.01 1.10 1.10  Sodium 135 - 145 mmol/L 136 135 136  Potassium 3.5 - 5.1 mmol/L 4.1 3.9 3.8  Chloride 101 - 111 mmol/L 107 106 105  CO2 22 - 32 mmol/L _1 Calcium 8.9 - 10.3 mg/dL 8.7(L) 8.7(L) 8.6(L)  Total Protein 6.5 - 8.1 g/dL 7.1 6.8 7.5  Total Bilirubin 0.3 - 1.2 mg/dL 1.0 0.9 1.2  Alkaline Phos 38 - 126 U/L 42 43 52  AST 15 - 41 U/L _2 ALT 17 - 63 U/L 16(L) 15(L) 15(L)    RADIOGRAPHIC STUDIES: I have personally reviewed the radiological images as listed and agree with the findings in the report CT chest w contrast 08/31/2016 IMPRESSION: 1. Left peripheral upper lobe mass measuring up to 3.5 cm, likely primary lung cancer. 2. 8 mm satellite nodule just anterior to the mass. 3. Aortopulmonary window lymphadenopathy, possibly metastatic. 4. Nonspecific 6 mm right lower lobe pulmonary nodule. 5. Moderate emphysema and  mild peripheral basilar fibrosis  PET scan 09/06/2016 IMPRESSION: Hypermetabolic 3.4 cm left upper lobe mass with adjacent sub-cm satellite nodule, consistent with primary bronchogenic carcinoma. Hypermetabolic left hilar and mediastinal AP window lymphadenopathy, consistent with metastatic disease.No evidence of distant metastatic disease.  Pathology 09/22/2016 Surgical Pathology  CASE: [id number]  PATIENT: [name]  Surgical Pathology Report  SPECIMEN SUBMITTED:  A. Lung, LUL pulmonary mass  DIAGNOSIS:  A. LUNG MASS, LEFT UPPER LOBE; CT GUIDED BIOPSY:  - SQUAMOUS CELL CARCINOMA.   ASSESSMENT/PLAN Cancer Staging Squamous cell lung cancer, left [facility]) Staging form: Lung, AJCC 8th Edition - Clinical stage from 09/27/2016: Stage IIIA (cT2a, cN2, cM0) - Signed by Earlie Server, MD on 09/29/2016  1. Squamous cell lung cancer, left ([place])   2. Malignant neoplasm of upper lobe of left lung ([place])   3. Encounter for antineoplastic chemotherapy   . # On concurrent chemotherapy with RT followed by consolidation with chemotherapy and maintenance with immunotherapy.  Plan Regimen for chemotherapy will be Carbo AUC 2 and Taxol 48m/m2 weekly with RT, followed by 2 cycles of carboplatin AUC = 6, paclitaxel 200 mg/m2, and followed by immunotherapy..  # Proceed cycle 3 treatment today.  # Follow up Radonc to complete RT course.  # He has Antiemetics-Zofran and Compazine; EMLA cream.  I will see him on Day 1 of his cycle 4 weekly concurrent chemotherapy w labs. No orders of the defined types were placed in this encounter.  All questions were answered. The patient knows to call the clinic with any problems, questions or concerns.   ZEarlie Server MD  11/04/2016 11:00 AM

## 2016-11-06 ENCOUNTER — Ambulatory Visit: Payer: Medicare HMO

## 2016-11-07 ENCOUNTER — Ambulatory Visit
Admission: RE | Admit: 2016-11-07 | Discharge: 2016-11-07 | Disposition: A | Discharge status: Home / Self Care | Payer: Medicare HMO | Source: Ambulatory Visit | Attending: Radiation Oncology | Admitting: Radiation Oncology

## 2016-11-07 DIAGNOSIS — Z510 Encounter for antineoplastic radiation therapy: Secondary | ICD-10-CM | POA: Diagnosis not present

## 2016-11-08 ENCOUNTER — Ambulatory Visit
Admission: RE | Admit: 2016-11-08 | Discharge: 2016-11-08 | Disposition: A | Discharge status: Home / Self Care | Payer: Medicare HMO | Source: Ambulatory Visit | Attending: Radiation Oncology | Admitting: Radiation Oncology

## 2016-11-08 DIAGNOSIS — Z510 Encounter for antineoplastic radiation therapy: Secondary | ICD-10-CM | POA: Diagnosis not present

## 2016-11-09 ENCOUNTER — Ambulatory Visit
Admission: RE | Admit: 2016-11-09 | Discharge: 2016-11-09 | Disposition: A | Discharge status: Home / Self Care | Payer: Medicare HMO | Source: Ambulatory Visit | Attending: Radiation Oncology | Admitting: Radiation Oncology

## 2016-11-09 DIAGNOSIS — Z510 Encounter for antineoplastic radiation therapy: Secondary | ICD-10-CM | POA: Diagnosis not present

## 2016-11-10 ENCOUNTER — Ambulatory Visit
Admission: RE | Admit: 2016-11-10 | Discharge: 2016-11-10 | Disposition: A | Discharge status: Home / Self Care | Payer: Medicare HMO | Source: Ambulatory Visit | Attending: Radiation Oncology | Admitting: Radiation Oncology

## 2016-11-10 DIAGNOSIS — Z510 Encounter for antineoplastic radiation therapy: Secondary | ICD-10-CM | POA: Diagnosis not present

## 2016-11-11 ENCOUNTER — Inpatient Hospital Stay (HOSPITAL_BASED_OUTPATIENT_CLINIC_OR_DEPARTMENT_OTHER): Payer: Medicare HMO | Admitting: Oncology

## 2016-11-11 ENCOUNTER — Inpatient Hospital Stay: Payer: Medicare HMO

## 2016-11-11 ENCOUNTER — Encounter: Payer: Self-pay | Admitting: Oncology

## 2016-11-11 ENCOUNTER — Ambulatory Visit
Admission: RE | Admit: 2016-11-11 | Discharge: 2016-11-11 | Disposition: A | Discharge status: Home / Self Care | Payer: Medicare HMO | Source: Ambulatory Visit | Attending: Radiation Oncology | Admitting: Radiation Oncology

## 2016-11-11 VITALS — BP 131/81 | HR 75 | Temp 98.2°F | Ht 71.0 in | Wt 250.6 lb

## 2016-11-11 DIAGNOSIS — Z801 Family history of malignant neoplasm of trachea, bronchus and lung: Secondary | ICD-10-CM | POA: Diagnosis not present

## 2016-11-11 DIAGNOSIS — Z5111 Encounter for antineoplastic chemotherapy: Secondary | ICD-10-CM | POA: Diagnosis not present

## 2016-11-11 DIAGNOSIS — R05 Cough: Secondary | ICD-10-CM

## 2016-11-11 DIAGNOSIS — R042 Hemoptysis: Secondary | ICD-10-CM | POA: Diagnosis not present

## 2016-11-11 DIAGNOSIS — Z510 Encounter for antineoplastic radiation therapy: Secondary | ICD-10-CM | POA: Diagnosis not present

## 2016-11-11 DIAGNOSIS — Z87891 Personal history of nicotine dependence: Secondary | ICD-10-CM

## 2016-11-11 DIAGNOSIS — K1379 Other lesions of oral mucosa: Secondary | ICD-10-CM

## 2016-11-11 DIAGNOSIS — Z7982 Long term (current) use of aspirin: Secondary | ICD-10-CM | POA: Diagnosis not present

## 2016-11-11 DIAGNOSIS — J029 Acute pharyngitis, unspecified: Secondary | ICD-10-CM | POA: Diagnosis not present

## 2016-11-11 DIAGNOSIS — Z79899 Other long term (current) drug therapy: Secondary | ICD-10-CM

## 2016-11-11 DIAGNOSIS — J439 Emphysema, unspecified: Secondary | ICD-10-CM | POA: Diagnosis not present

## 2016-11-11 DIAGNOSIS — C3412 Malignant neoplasm of upper lobe, left bronchus or lung: Secondary | ICD-10-CM | POA: Diagnosis not present

## 2016-11-11 DIAGNOSIS — R634 Abnormal weight loss: Secondary | ICD-10-CM | POA: Diagnosis not present

## 2016-11-11 DIAGNOSIS — N400 Enlarged prostate without lower urinary tract symptoms: Secondary | ICD-10-CM | POA: Diagnosis not present

## 2016-11-11 DIAGNOSIS — C3492 Malignant neoplasm of unspecified part of left bronchus or lung: Secondary | ICD-10-CM

## 2016-11-11 LAB — CBC WITH DIFFERENTIAL/PLATELET
BASOS ABS: 0 10*3/uL (ref 0–0.1)
BASOS PCT: 1 %
EOS PCT: 1 %
Eosinophils Absolute: 0 10*3/uL (ref 0–0.7)
HCT: 42.5 % (ref 40.0–52.0)
Hemoglobin: 14.2 g/dL (ref 13.0–18.0)
LYMPHS PCT: 8 %
Lymphs Abs: 0.3 10*3/uL — ABNORMAL LOW (ref 1.0–3.6)
MCH: 28.3 pg (ref 26.0–34.0)
MCHC: 33.5 g/dL (ref 32.0–36.0)
MCV: 84.7 fL (ref 80.0–100.0)
MONO ABS: 0.3 10*3/uL (ref 0.2–1.0)
MONOS PCT: 8 %
Neutro Abs: 2.9 10*3/uL (ref 1.4–6.5)
Neutrophils Relative %: 82 %
PLATELETS: 166 10*3/uL (ref 150–440)
RBC: 5.02 MIL/uL (ref 4.40–5.90)
RDW: 14.1 % (ref 11.5–14.5)
WBC: 3.6 10*3/uL — ABNORMAL LOW (ref 3.8–10.6)

## 2016-11-11 LAB — COMPREHENSIVE METABOLIC PANEL
ALT: 22 U/L (ref 17–63)
ANION GAP: 5 (ref 5–15)
AST: 17 U/L (ref 15–41)
Albumin: 3.7 g/dL (ref 3.5–5.0)
Alkaline Phosphatase: 48 U/L (ref 38–126)
BILIRUBIN TOTAL: 1.1 mg/dL (ref 0.3–1.2)
BUN: 22 mg/dL — AB (ref 6–20)
CHLORIDE: 106 mmol/L (ref 101–111)
CO2: 23 mmol/L (ref 22–32)
Calcium: 8.9 mg/dL (ref 8.9–10.3)
Creatinine, Ser: 1.14 mg/dL (ref 0.61–1.24)
GFR calc Af Amer: >60 mL/min (ref >60)
Glucose, Bld: 95 mg/dL (ref 65–99)
POTASSIUM: 4.7 mmol/L (ref 3.5–5.1)
Sodium: 134 mmol/L — ABNORMAL LOW (ref 135–145)
TOTAL PROTEIN: 7.6 g/dL (ref 6.5–8.1)

## 2016-11-11 MED ORDER — DIPHENHYDRAMINE HCL 50 MG/ML IJ SOLN
50.0000 mg | Freq: Once | INTRAMUSCULAR | Status: AC
  Administered 2016-11-11: 50 mg via INTRAVENOUS
  Filled 2016-11-11: qty 1

## 2016-11-11 MED ORDER — CARBOPLATIN CHEMO INJECTION 450 MG/45ML
260.0000 mg | Freq: Once | INTRAVENOUS | Status: AC
  Administered 2016-11-11: 260 mg via INTRAVENOUS
  Filled 2016-11-11: qty 26

## 2016-11-11 MED ORDER — SODIUM CHLORIDE 0.9 % IV SOLN
Freq: Once | INTRAVENOUS | Status: AC
  Administered 2016-11-11: 11:00:00 via INTRAVENOUS
  Filled 2016-11-11: qty 1000

## 2016-11-11 MED ORDER — SODIUM CHLORIDE 0.9 % IV SOLN
20.0000 mg | Freq: Once | INTRAVENOUS | Status: AC
  Administered 2016-11-11: 20 mg via INTRAVENOUS
  Filled 2016-11-11: qty 2

## 2016-11-11 MED ORDER — PALONOSETRON HCL INJECTION 0.25 MG/5ML
0.2500 mg | Freq: Once | INTRAVENOUS | Status: AC
  Administered 2016-11-11: 0.25 mg via INTRAVENOUS
  Filled 2016-11-11: qty 5

## 2016-11-11 MED ORDER — SODIUM CHLORIDE 0.9% FLUSH
10.0000 mL | INTRAVENOUS | Status: DC | PRN
  Filled 2016-11-11: qty 10

## 2016-11-11 MED ORDER — HEPARIN SOD (PORK) LOCK FLUSH 100 UNIT/ML IV SOLN
500.0000 [IU] | Freq: Once | INTRAVENOUS | Status: AC
  Administered 2016-11-11: 500 [IU] via INTRAVENOUS
  Filled 2016-11-11: qty 5

## 2016-11-11 MED ORDER — MAGIC MOUTHWASH W/LIDOCAINE: 5.0000 mL | Freq: Four times a day (QID) | ORAL | 3 refills | Status: DC

## 2016-11-11 MED ORDER — FAMOTIDINE IN NACL 20-0.9 MG/50ML-% IV SOLN
20.0000 mg | Freq: Once | INTRAVENOUS | Status: AC
  Administered 2016-11-11: 20 mg via INTRAVENOUS
  Filled 2016-11-11: qty 50

## 2016-11-11 MED ORDER — PACLITAXEL CHEMO INJECTION 300 MG/50ML
45.0000 mg/m2 | Freq: Once | INTRAVENOUS | Status: AC
  Administered 2016-11-11: 114 mg via INTRAVENOUS
  Filled 2016-11-11: qty 19

## 2016-11-11 NOTE — Progress Notes (Signed)
[place] [facility] Follow Up Visit  Patient Care Team: Adline Potter, MD as PCP - General (Family Medicine) Telford Nab, RN as Registered Nurse  REASON FOR VISIT:  Follow up for evaluation for chemotherapy.   PERTINENT ONCOLOGY HISTORY 1  [name] 71 y.o. African American male previous healthy, former smoker who is referred to me for evaluation and management of newly diagnosed lung cancer. He gets his usual care at [facility]. He presented last month to urgent care due to coughing and hemoptysis. X ray showed left lung mass. Cough up small amount of blood mixed in the sputum. Denies SOB, chest pain, back pain, abdominal pain. Denies headache, vision changes. He works a Administrator.   2 Images: MRI brain showed no intracranial mets. CT showed LUL mass with a satellite lesion as well as mediastinal adenopathy.  PET was done which showed hypermetabolic left upper lobe mass as well as the adjacent nodule, ipsilateral hilar and mediastinal lymphadenopathy.    3 Pathology:  09/22/2016 left upper lobe biopsy showed squamous cell carcinoma. Foundation one test: ordered prior to patient first visit. Showed NO EGFR, ROS1, ALK, BRAF, MET mutation. TPS 50%.    INTERVAL HISTORY 71 yo male who has above history presents for evaluation before cycle 4 weekly carbo Taxol concurrent RT for stage III lung squamous cell.  Continue to have intermittent cough, no triggering factors, not alleviated with anything, no associated with hemoptysis: stable.  Weight loss 7 pounds in the past week, appetite is good.  Throat discomfort, intermittent, triggered by swallowing, not alleviated with anything. He denies fatigue, fever or chills, SOB,nausea/vomiting,  hemoptysis, abdominal pain. He still works part time as Administrator.   Review of Systems  Constitutional: Negative.   HENT:   Positive for sore throat.   Eyes: Negative.   Respiratory: Positive for cough.   Cardiovascular: Negative.    Gastrointestinal: Negative.   Endocrine: Negative.   Genitourinary: Negative.    Musculoskeletal: Negative.   Skin: Negative.   Neurological: Negative.   Hematological: Negative.   Psychiatric/Behavioral: Negative.     MEDICAL HISTORY: Past Medical History:  Diagnosis Date  . Enlarged prostate     SURGICAL HISTORY: Past Surgical History:  Procedure Laterality Date  . NO PAST SURGERIES    . PORTACATH PLACEMENT Right 10/05/2016   Procedure: INSERTION PORT-A-CATH;  Surgeon: [name], MD;  Location: [facility] ORS;  Service: General;  Laterality: Right;    SOCIAL HISTORY: Social History   Social History  . Marital status: Single    Spouse name: N/A  . Number of children: N/A  . Years of education: N/A   Occupational History  . Not on file.   Social History Main Topics  . Smoking status: Former Smoker    Packs/day: 1.00    Years: 15.00    Types: Cigarettes    Quit date: 09/28/1992  . Smokeless tobacco: Never Used  . Alcohol use No  . Drug use: No  . Sexual activity: Not on file   Other Topics Concern  . Not on file   Social History Narrative  . No narrative on file    FAMILY HISTORY Family History  Problem Relation Age of Onset  . Lymphoma Mother   . Diabetes Mother   . Lung cancer Sister   . Lung cancer Brother   . Lung cancer Brother     ALLERGIES:  has No Known Allergies.  MEDICATIONS:  Current Outpatient Prescriptions  Medication Sig Dispense Refill  .  chlorpheniramine-HYDROcodone (TUSSIONEX PENNKINETIC ER) 10-8 MG/5ML SUER Take 5 mLs by mouth every 12 (twelve) hours as needed. (Patient taking differently: Take 5 mLs by mouth every 12 (twelve) hours as needed for cough. ) 115 mL 0  . Cholecalciferol (VITAMIN D3) 2000 units capsule Take 1 capsule (2,000 Units total) by mouth daily.    . cyanocobalamin 500 MCG tablet Take 500 mcg by mouth daily.    Marland Kitchen dexamethasone (DECADRON) 4 MG tablet Take 2 tablets (8 mg total) by mouth daily. Start the day  after chemotherapy for 2 days. 30 tablet 1  . lidocaine-prilocaine (EMLA) cream Apply to affected area once 30 g 3  . mupirocin ointment (BACTROBAN) 2 % Apply 1 application topically 2 (two) times daily as needed. 22 g 2  . omeprazole (PRILOSEC) 40 MG capsule Take 1 capsule (40 mg total) by mouth daily. 30 capsule 3  . ondansetron (ZOFRAN) 8 MG tablet Take 1 tablet (8 mg total) by mouth 2 (two) times daily as needed for refractory nausea / vomiting. Start on day 3 after chemo. 30 tablet 1  . prochlorperazine (COMPAZINE) 10 MG tablet Take 1 tablet (10 mg total) by mouth every 6 (six) hours as needed (Nausea or vomiting). 30 tablet 1  . zolpidem (AMBIEN) 5 MG tablet Take 1 tablet (5 mg total) by mouth at bedtime as needed for sleep. 30 tablet 0   No current facility-administered medications for this visit.     PHYSICAL EXAMINATION:  ECOG PERFORMANCE STATUS: 0 - Asymptomatic   Vitals:   11/11/16 0953  BP: 131/81  Pulse: 75  Temp: 98.2 F (36.8 C)  SpO2: 100%    Filed Weights   11/11/16 0953  Weight: 250 lb 9.6 oz (113.7 kg)     Physical Exam GENERAL: No distress, well nourished.  SKIN:  No rashes or significant lesions  HEAD: Normocephalic, No masses, lesions, tenderness or abnormalities  EYES: Conjunctiva are pink, non icteric ENT: External ears normal ,lips , buccal mucosa, and tongue normal and mucous membranes are moist. No thrush.  LYMPH: No palpable cervical and axillary lymphadenopathy  LUNGS: Clear to auscultation, no crackles or wheezes HEART: Regular rate & rhythm, no murmurs, no gallops, S1 normal and S2 normal  ABDOMEN: Abdomen soft, non-tender, normal bowel sounds, I did not appreciate any  masses or organomegaly  MUSCULOSKELETAL: No CVA tenderness and no tenderness on percussion of the back or rib cage.  EXTREMITIES: No edema, no skin discoloration or tenderness NEURO: Alert & oriented, no focal motor/sensory deficits.  LABORATORY DATA: I have personally  reviewed the data as listed: CBC    Component Value Date/Time   WBC 3.6 (L) 11/11/2016 0925   RBC 5.02 11/11/2016 0925   HGB 14.2 11/11/2016 0925   HCT 42.5 11/11/2016 0925   PLT 166 11/11/2016 0925   MCV 84.7 11/11/2016 0925   MCH 28.3 11/11/2016 0925   MCHC 33.5 11/11/2016 0925   RDW 14.1 11/11/2016 0925   LYMPHSABS 0.3 (L) 11/11/2016 0925   MONOABS 0.3 11/11/2016 0925   EOSABS 0.0 11/11/2016 0925   BASOSABS 0.0 11/11/2016 0925    CMP Latest Ref Rng & Units 11/04/2016 10/28/2016 10/21/2016  Glucose 65 - 99 mg/dL 109(H) 94 99  BUN 6 - 20 mg/dL '14 13 10  ' Creatinine 0.61 - 1.24 mg/dL 1.01 1.10 1.10  Sodium 135 - 145 mmol/L 136 135 136  Potassium 3.5 - 5.1 mmol/L 4.1 3.9 3.8  Chloride 101 - 111 mmol/L 107 106 105  CO2 22 -  32 mmol/L '23 24 24  ' Calcium 8.9 - 10.3 mg/dL 8.7(L) 8.7(L) 8.6(L)  Total Protein 6.5 - 8.1 g/dL 7.1 6.8 7.5  Total Bilirubin 0.3 - 1.2 mg/dL 1.0 0.9 1.2  Alkaline Phos 38 - 126 U/L 42 43 52  AST 15 - 41 U/L '17 16 17  ' ALT 17 - 63 U/L 16(L) 15(L) 15(L)    RADIOGRAPHIC STUDIES: I have personally reviewed the radiological images as listed and agree with the findings in the report CT chest w contrast 08/31/2016 IMPRESSION: 1. Left peripheral upper lobe mass measuring up to 3.5 cm, likely primary lung cancer. 2. 8 mm satellite nodule just anterior to the mass. 3. Aortopulmonary window lymphadenopathy, possibly metastatic. 4. Nonspecific 6 mm right lower lobe pulmonary nodule. 5. Moderate emphysema and mild peripheral basilar fibrosis  PET scan 09/06/2016 IMPRESSION: Hypermetabolic 3.4 cm left upper lobe mass with adjacent sub-cm satellite nodule, consistent with primary bronchogenic carcinoma. Hypermetabolic left hilar and mediastinal AP window lymphadenopathy, consistent with metastatic disease.No evidence of distant metastatic disease.  Pathology 09/22/2016 Surgical Pathology  CASE: [id number]  PATIENT: [name]  Surgical Pathology Report  SPECIMEN  SUBMITTED:  A. Lung, LUL pulmonary mass  DIAGNOSIS:  A. LUNG MASS, LEFT UPPER LOBE; CT GUIDED BIOPSY:  - SQUAMOUS CELL CARCINOMA.   ASSESSMENT/PLAN Cancer Staging Squamous cell lung cancer, left [facility]) Staging form: Lung, AJCC 8th Edition - Clinical stage from 09/27/2016: Stage IIIA (cT2a, cN2, cM0) - Signed by Earlie Server, MD on 09/29/2016  1. Squamous cell lung cancer, left ([place])   2. Malignant neoplasm of upper lobe of left lung ([place])   3. Encounter for antineoplastic chemotherapy   4. Weight loss   5. Sore throat   6. Mouth sores   . 1 On concurrent chemotherapy with RT followed by consolidation with chemotherapy and maintenance with immunotherapy.  Regimen: Carbo AUC 2 and Taxol 75m/m2 weekly with RT, followed by 2 cycles of carboplatin AUC = 6, paclitaxel 200 mg/m2, and followed by immunotherapy..Marland Kitchen 3:  Proceed cycle 4 treatment today.  He has Antiemetics-Zofran and Compazine; EMLA cream. 4: Sore throat; start magic mouth wash swish and swallow today and reassess at next visit.  5  Weight loss: continue to monitor. Advise counting calories.   I will see him on Day 1 of his cycle 5 weekly concurrent chemotherapy. CBC CMP prior to visit.  No orders of the defined types were placed in this encounter.  All questions were answered. The patient knows to call the clinic with any problems, questions or concerns.   ZEarlie Server MD  11/11/2016 9:05 AM

## 2016-11-11 NOTE — Progress Notes (Signed)
Patient here for pre  Treatment check. He has lost 9 lbs since his last appointment.

## 2016-11-14 ENCOUNTER — Ambulatory Visit
Admission: RE | Admit: 2016-11-14 | Discharge: 2016-11-14 | Disposition: A | Discharge status: Home / Self Care | Payer: Medicare HMO | Source: Ambulatory Visit | Attending: Radiation Oncology | Admitting: Radiation Oncology

## 2016-11-14 DIAGNOSIS — Z510 Encounter for antineoplastic radiation therapy: Secondary | ICD-10-CM | POA: Diagnosis not present

## 2016-11-15 ENCOUNTER — Ambulatory Visit
Admission: RE | Admit: 2016-11-15 | Discharge: 2016-11-15 | Disposition: A | Discharge status: Home / Self Care | Payer: Medicare HMO | Source: Ambulatory Visit | Attending: Radiation Oncology | Admitting: Radiation Oncology

## 2016-11-15 DIAGNOSIS — Z510 Encounter for antineoplastic radiation therapy: Secondary | ICD-10-CM | POA: Diagnosis not present

## 2016-11-16 ENCOUNTER — Ambulatory Visit
Admission: RE | Admit: 2016-11-16 | Discharge: 2016-11-16 | Disposition: A | Discharge status: Home / Self Care | Payer: Medicare HMO | Source: Ambulatory Visit | Attending: Radiation Oncology | Admitting: Radiation Oncology

## 2016-11-16 DIAGNOSIS — Z510 Encounter for antineoplastic radiation therapy: Secondary | ICD-10-CM | POA: Diagnosis not present

## 2016-11-17 ENCOUNTER — Ambulatory Visit
Admission: RE | Admit: 2016-11-17 | Discharge: 2016-11-17 | Disposition: A | Discharge status: Home / Self Care | Payer: Medicare HMO | Source: Ambulatory Visit | Attending: Radiation Oncology | Admitting: Radiation Oncology

## 2016-11-17 DIAGNOSIS — Z510 Encounter for antineoplastic radiation therapy: Secondary | ICD-10-CM | POA: Diagnosis not present

## 2016-11-18 ENCOUNTER — Inpatient Hospital Stay: Payer: Medicare HMO | Attending: Oncology | Admitting: *Deleted

## 2016-11-18 ENCOUNTER — Inpatient Hospital Stay (HOSPITAL_BASED_OUTPATIENT_CLINIC_OR_DEPARTMENT_OTHER): Payer: Medicare HMO | Admitting: Oncology

## 2016-11-18 ENCOUNTER — Ambulatory Visit
Admission: RE | Admit: 2016-11-18 | Discharge: 2016-11-18 | Disposition: A | Discharge status: Home / Self Care | Payer: Medicare HMO | Source: Ambulatory Visit | Attending: Radiation Oncology | Admitting: Radiation Oncology

## 2016-11-18 ENCOUNTER — Encounter: Payer: Self-pay | Admitting: Oncology

## 2016-11-18 ENCOUNTER — Inpatient Hospital Stay: Payer: Medicare HMO

## 2016-11-18 VITALS — BP 126/74 | HR 76 | Resp 18

## 2016-11-18 VITALS — BP 112/70 | HR 71 | Temp 96.0°F | Wt 248.0 lb

## 2016-11-18 DIAGNOSIS — Z5111 Encounter for antineoplastic chemotherapy: Secondary | ICD-10-CM | POA: Diagnosis present

## 2016-11-18 DIAGNOSIS — R1310 Dysphagia, unspecified: Secondary | ICD-10-CM

## 2016-11-18 DIAGNOSIS — Z510 Encounter for antineoplastic radiation therapy: Secondary | ICD-10-CM | POA: Diagnosis not present

## 2016-11-18 DIAGNOSIS — N400 Enlarged prostate without lower urinary tract symptoms: Secondary | ICD-10-CM | POA: Insufficient documentation

## 2016-11-18 DIAGNOSIS — D696 Thrombocytopenia, unspecified: Secondary | ICD-10-CM | POA: Diagnosis not present

## 2016-11-18 DIAGNOSIS — C3492 Malignant neoplasm of unspecified part of left bronchus or lung: Secondary | ICD-10-CM

## 2016-11-18 DIAGNOSIS — Z87891 Personal history of nicotine dependence: Secondary | ICD-10-CM | POA: Diagnosis not present

## 2016-11-18 DIAGNOSIS — J439 Emphysema, unspecified: Secondary | ICD-10-CM | POA: Insufficient documentation

## 2016-11-18 DIAGNOSIS — R634 Abnormal weight loss: Secondary | ICD-10-CM | POA: Diagnosis not present

## 2016-11-18 DIAGNOSIS — Z79899 Other long term (current) drug therapy: Secondary | ICD-10-CM

## 2016-11-18 DIAGNOSIS — C3412 Malignant neoplasm of upper lobe, left bronchus or lung: Secondary | ICD-10-CM

## 2016-11-18 DIAGNOSIS — R911 Solitary pulmonary nodule: Secondary | ICD-10-CM | POA: Diagnosis not present

## 2016-11-18 DIAGNOSIS — Z801 Family history of malignant neoplasm of trachea, bronchus and lung: Secondary | ICD-10-CM

## 2016-11-18 DIAGNOSIS — R599 Enlarged lymph nodes, unspecified: Secondary | ICD-10-CM | POA: Diagnosis not present

## 2016-11-18 LAB — COMPREHENSIVE METABOLIC PANEL
ALBUMIN: 3.7 g/dL (ref 3.5–5.0)
ALK PHOS: 48 U/L (ref 38–126)
ALT: 26 U/L (ref 17–63)
ANION GAP: 5 (ref 5–15)
AST: 20 U/L (ref 15–41)
BUN: 20 mg/dL (ref 6–20)
CO2: 24 mmol/L (ref 22–32)
Calcium: 9 mg/dL (ref 8.9–10.3)
Chloride: 107 mmol/L (ref 101–111)
Creatinine, Ser: 1.17 mg/dL (ref 0.61–1.24)
GFR calc Af Amer: >60 mL/min (ref >60)
GFR calc non Af Amer: >60 mL/min (ref >60)
GLUCOSE: 94 mg/dL (ref 65–99)
POTASSIUM: 4.5 mmol/L (ref 3.5–5.1)
SODIUM: 136 mmol/L (ref 135–145)
Total Bilirubin: 1 mg/dL (ref 0.3–1.2)
Total Protein: 7.2 g/dL (ref 6.5–8.1)

## 2016-11-18 LAB — CBC WITH DIFFERENTIAL/PLATELET
BASOS ABS: 0 10*3/uL (ref 0–0.1)
BASOS PCT: 1 %
Eosinophils Absolute: 0 10*3/uL (ref 0–0.7)
Eosinophils Relative: 1 %
HEMATOCRIT: 40.9 % (ref 40.0–52.0)
HEMOGLOBIN: 13.8 g/dL (ref 13.0–18.0)
LYMPHS PCT: 9 %
Lymphs Abs: 0.2 10*3/uL — ABNORMAL LOW (ref 1.0–3.6)
MCH: 28.5 pg (ref 26.0–34.0)
MCHC: 33.7 g/dL (ref 32.0–36.0)
MCV: 84.6 fL (ref 80.0–100.0)
MONO ABS: 0.2 10*3/uL (ref 0.2–1.0)
MONOS PCT: 8 %
NEUTROS ABS: 1.9 10*3/uL (ref 1.4–6.5)
NEUTROS PCT: 81 %
PLATELETS: 108 10*3/uL — AB (ref 150–440)
RBC: 4.83 MIL/uL (ref 4.40–5.90)
RDW: 14.3 % (ref 11.5–14.5)
WBC: 2.3 10*3/uL — ABNORMAL LOW (ref 3.8–10.6)

## 2016-11-18 LAB — TSH: TSH: 1.492 u[IU]/mL (ref 0.350–4.500)

## 2016-11-18 MED ORDER — PALONOSETRON HCL INJECTION 0.25 MG/5ML
0.2500 mg | Freq: Once | INTRAVENOUS | Status: AC
  Administered 2016-11-18: 0.25 mg via INTRAVENOUS
  Filled 2016-11-18: qty 5

## 2016-11-18 MED ORDER — PACLITAXEL CHEMO INJECTION 300 MG/50ML
45.0000 mg/m2 | Freq: Once | INTRAVENOUS | Status: AC
  Administered 2016-11-18: 114 mg via INTRAVENOUS
  Filled 2016-11-18: qty 19

## 2016-11-18 MED ORDER — SODIUM CHLORIDE 0.9 % IV SOLN
Freq: Once | INTRAVENOUS | Status: AC
  Administered 2016-11-18: 10:00:00 via INTRAVENOUS
  Filled 2016-11-18: qty 1000

## 2016-11-18 MED ORDER — DIPHENHYDRAMINE HCL 50 MG/ML IJ SOLN
50.0000 mg | Freq: Once | INTRAMUSCULAR | Status: AC
  Administered 2016-11-18: 50 mg via INTRAVENOUS
  Filled 2016-11-18: qty 1

## 2016-11-18 MED ORDER — FAMOTIDINE IN NACL 20-0.9 MG/50ML-% IV SOLN
20.0000 mg | Freq: Once | INTRAVENOUS | Status: AC
  Administered 2016-11-18: 20 mg via INTRAVENOUS
  Filled 2016-11-18: qty 50

## 2016-11-18 MED ORDER — CARBOPLATIN CHEMO INJECTION 450 MG/45ML
260.0000 mg | Freq: Once | INTRAVENOUS | Status: AC
  Administered 2016-11-18: 260 mg via INTRAVENOUS
  Filled 2016-11-18: qty 26

## 2016-11-18 MED ORDER — SODIUM CHLORIDE 0.9 % IV SOLN
20.0000 mg | Freq: Once | INTRAVENOUS | Status: AC
  Administered 2016-11-18: 20 mg via INTRAVENOUS
  Filled 2016-11-18: qty 2

## 2016-11-18 MED ORDER — HEPARIN SOD (PORK) LOCK FLUSH 100 UNIT/ML IV SOLN
500.0000 [IU] | Freq: Once | INTRAVENOUS | Status: AC | PRN
  Administered 2016-11-18: 500 [IU]
  Filled 2016-11-18: qty 5

## 2016-11-18 NOTE — Progress Notes (Addendum)
[name] Follow Up Visit  Patient Care Team: Adline Potter, MD as PCP - General (Family Medicine) Telford Nab, RN as Registered Nurse  REASON FOR VISIT:  Follow up for evaluation for chemotherapy.   HISTORY OF PRESENT ILLNESS/ PERTINENT ONCOLOGY HISTORY 1  [name] 71 y.o. African American male previous healthy, former smoker who is referred to me for evaluation and management of newly diagnosed lung cancer. He gets his usual care at [facility]. He presented last month to urgent care due to coughing and hemoptysis. X ray showed left lung mass. Cough up small amount of blood mixed in the sputum. Denies SOB, chest pain, back pain, abdominal pain. Denies headache, vision changes. He works a Administrator.  2 Images: PET was done which showed hypermetabolic left upper lobe mass as well as the adjacent nodule, ipsilateral hilar and mediastinal lymphadenopathy.   MRI brain showed no intracranial mets. CT showed LUL mass with a satellite lesion as well as mediastinal adenopathy.   3 Pathology: TPS 50%. 09/22/2016 left upper lobe biopsy showed squamous cell carcinoma. Foundation one test: ordered prior to patient first visit. Showed NO EGFR, ROS1, ALK, BRAF, MET mutation.  [name] who has above history reviewed and edited by me presents for cycle 5 weekly carbo Taxol /RT for Stage III lung squamous cell.  He has intermittent cough: stable:   not alleviated with anything, no associated with hemoptysis:  Weight loss additional 2 pounds in the past week despite having appetite is good.  Throat discomfort, intermittent, triggered by swallowing, not alleviated with anything: resolved with magic mouth wash He denies fatigue, fever or chills, SOB,nausea/vomiting,  hemoptysis, abdominal pain.   Review of Systems  Review of Systems  Constitutional: Negative for fever, night sweats, change in appetite, (+)unintentional weight loss,  HENT: Negative for ear pain, hearing loss, nasal  bleeding Eyes: Negative for eye pain, double vision   Respiratory: Negative for wheezing, shortness of breath, cough Cardiovascular: Negative for chest pain, palpitation.   Gastrointestinal: Negative abdominal pain, diarrhea, nausea vomiting Endocrine: Negative  Genitourinary: Negative for dysuria, hematuria, frequency Skin: Negative for rash, iching, bruising Neurological: Negative for headache, dizziness, seizure Hematological: Negative for easy bruising/bleeding, lymph node enlargement Psychiatric/Behavioral: Negative for depression, anxiety, suicidality  MEDICAL HISTORY: Past Medical History:  Diagnosis Date  . Enlarged prostate     SURGICAL HISTORY: Past Surgical History:  Procedure Laterality Date  . NO PAST SURGERIES    . PORTACATH PLACEMENT Right 10/05/2016   Procedure: INSERTION PORT-A-CATH;  Surgeon: [name], MD;  Location: [facility] ORS;  Service: General;  Laterality: Right;    SOCIAL HISTORY: Social History   Social History  . Marital status: Single    Spouse name: N/A  . Number of children: N/A  . Years of education: N/A   Occupational History  . Not on file.   Social History Main Topics  . Smoking status: Former Smoker    Packs/day: 1.00    Years: 15.00    Types: Cigarettes    Quit date: 09/28/1992  . Smokeless tobacco: Never Used  . Alcohol use No  . Drug use: No  . Sexual activity: Not on file   Other Topics Concern  . Not on file   Social History Narrative  . No narrative on file    FAMILY HISTORY Family History  Problem Relation Age of Onset  . Lymphoma Mother   . Diabetes Mother   . Lung cancer Sister   . Lung cancer  Brother   . Lung cancer Brother     ALLERGIES:  has No Known Allergies.  MEDICATIONS:  Current Outpatient Prescriptions  Medication Sig Dispense Refill  . Cholecalciferol (VITAMIN D3) 2000 units capsule Take 1 capsule (2,000 Units total) by mouth daily.    . cyanocobalamin 500 MCG tablet Take 500 mcg by mouth  daily.    Marland Kitchen dexamethasone (DECADRON) 4 MG tablet Take 2 tablets (8 mg total) by mouth daily. Start the day after chemotherapy for 2 days. 30 tablet 1  . lidocaine-prilocaine (EMLA) cream Apply to affected area once 30 g 3  . magic mouthwash w/lidocaine SOLN Take 5 mLs by mouth 4 (four) times daily. 80 ml viscous lidocaine 2%, 80 ml Mylanta, 80 ml Diphenhydramine 12.5 mg/5 ml Elixir, 80 ml Nystatin 100,000 Unit suspension, 80 ml Prednisolone 15 mg/34m, 80 ml Distilled Water. Sig: Swish/Swallow 5-10 ml four times a day as needed. Dispense 480 ml. 3RFs 480 mL 3  . mupirocin ointment (BACTROBAN) 2 % Apply 1 application topically 2 (two) times daily as needed. 22 g 2  . omeprazole (PRILOSEC) 40 MG capsule Take 1 capsule (40 mg total) by mouth daily. 30 capsule 3  . ondansetron (ZOFRAN) 8 MG tablet Take 1 tablet (8 mg total) by mouth 2 (two) times daily as needed for refractory nausea / vomiting. Start on day 3 after chemo. 30 tablet 1  . prochlorperazine (COMPAZINE) 10 MG tablet Take 1 tablet (10 mg total) by mouth every 6 (six) hours as needed (Nausea or vomiting). 30 tablet 1  . zolpidem (AMBIEN) 5 MG tablet Take 1 tablet (5 mg total) by mouth at bedtime as needed for sleep. 30 tablet 0   No current facility-administered medications for this visit.     PHYSICAL EXAMINATION:  ECOG PERFORMANCE STATUS: 0 - Asymptomatic   Vitals:   11/18/16 0924  BP: 112/70  Pulse: 71  Temp: (!) 96 F (35.6 C)  SpO2: 99%   Filed Weights   11/18/16 0924  Weight: 248 lb (112.5 kg)  Physical Exam  GENERAL: No distress, well nourished.  SKIN:  No rashes or significant lesions  HEAD: Normocephalic, No masses, lesions, tenderness or abnormalities  EYES: Conjunctiva are pink, non icteric ENT: External ears normal ,lips , buccal mucosa, and tongue normal and mucous membranes are moist  LYMPH: No palpable cervical and axillary lymphadenopathy  LUNGS: Clear to auscultation, no crackles or wheezes HEART: Regular  rate & rhythm, no murmurs, no gallops, S1 normal and S2 normal  ABDOMEN: Abdomen soft, non-tender, normal bowel sounds, I did not appreciate any  masses or organomegaly  MUSCULOSKELETAL: No CVA tenderness and no tenderness on percussion of the back or rib cage.  EXTREMITIES: No edema, no skin discoloration or tenderness NEURO: Alert & oriented, no focal motor/sensory deficits.  LABORATORY DATA: I have personally reviewed the data as listed: CBC    Component Value Date/Time   WBC 2.3 (L) 11/18/2016 0906   RBC 4.83 11/18/2016 0906   HGB 13.8 11/18/2016 0906   HCT 40.9 11/18/2016 0906   PLT 108 (L) 11/18/2016 0906   MCV 84.6 11/18/2016 0906   MCH 28.5 11/18/2016 0906   MCHC 33.7 11/18/2016 0906   RDW 14.3 11/18/2016 0906   LYMPHSABS 0.2 (L) 11/18/2016 0906   MONOABS 0.2 11/18/2016 0906   EOSABS 0.0 11/18/2016 0906   BASOSABS 0.0 11/18/2016 0906    CMP Latest Ref Rng & Units 11/18/2016 11/11/2016 11/04/2016  Glucose 65 - 99 mg/dL 94 95 109(H)  BUN 6 - 20 mg/dL 20 22(H) 14  Creatinine 0.61 - 1.24 mg/dL 1.17 1.14 1.01  Sodium 135 - 145 mmol/L 136 134(L) 136  Potassium 3.5 - 5.1 mmol/L 4.5 4.7 4.1  Chloride 101 - 111 mmol/L 107 106 107  CO2 22 - 32 mmol/L '24 23 23  ' Calcium 8.9 - 10.3 mg/dL 9.0 8.9 8.7(L)  Total Protein 6.5 - 8.1 g/dL 7.2 7.6 7.1  Total Bilirubin 0.3 - 1.2 mg/dL 1.0 1.1 1.0  Alkaline Phos 38 - 126 U/L 48 48 42  AST 15 - 41 U/L '20 17 17  ' ALT 17 - 63 U/L 26 22 16(L)    RADIOGRAPHIC STUDIES: I have personally reviewed the radiological images as listed and agree with the findings in the report CT chest w contrast 08/31/2016 IMPRESSION: 1. Left peripheral upper lobe mass measuring up to 3.5 cm, likely primary lung cancer. 2. 8 mm satellite nodule just anterior to the mass. 3. Aortopulmonary window lymphadenopathy, possibly metastatic. 4. Nonspecific 6 mm right lower lobe pulmonary nodule. 5. Moderate emphysema and mild peripheral basilar fibrosis  PET scan  09/06/2016 IMPRESSION: Hypermetabolic 3.4 cm left upper lobe mass with adjacent sub-cm satellite nodule, consistent with primary bronchogenic carcinoma. Hypermetabolic left hilar and mediastinal AP window lymphadenopathy, consistent with metastatic disease.No evidence of distant metastatic disease.  Pathology 09/22/2016 Surgical Pathology  CASE: [id number]  PATIENT: [name]  Surgical Pathology Report  SPECIMEN SUBMITTED:  A. Lung, LUL pulmonary mass  DIAGNOSIS:  A. LUNG MASS, LEFT UPPER LOBE; CT GUIDED BIOPSY:  - SQUAMOUS CELL CARCINOMA.   ASSESSMENT/PLAN Cancer Staging Squamous cell lung cancer, left [facility]) Staging form: Lung, AJCC 8th Edition - Clinical stage from 09/27/2016: Stage IIIA (cT2a, cN2, cM0) - Signed by Earlie Server, MD on 09/29/2016  1. Squamous cell lung cancer, left ([place])   2. Encounter for antineoplastic chemotherapy   3. Weight loss   . 1 On concurrent chemotherapy with RT followed by consolidation with chemotherapy and maintenance with immunotherapy.  Regimen: Carbo AUC 2 and Taxol 40m/m2 weekly with RT, plan Durvalumab consolidation after finishing chemo/RT.  Discussed about the immunotherapy as consolidation treatment.  I discussed the mechanism of action;  Discussed the potential side effects of immunotherapy including but not limited to diarrhea; skin rash; elevated LFTs/endocrine abnormalities etc. Chemo immunotherapy education.   3:  Proceed cycle 5 treatment today.  He has Antiemetics-Zofran and Compazine; EMLA cream. 4  Weight loss: continue to monitor. Appetite is good. Will check TSH today.  Patient and her daughter asked if Apple apricot  seeds can be helpful. I advise patient not to consume herbal supplementation to avoid interference of chemotherapy. And these supplements have not been shown to have effective anti cancer effects.   I will see him on Day 1 of his cycle 6 weekly concurrent chemotherapy. CBC CMP prior to visit.   Orders Placed This  Encounter  Procedures  . TSH    Standing Status:   Future    Standing Expiration Date:   11/18/2017   All questions were answered. The patient knows to call the clinic with any problems, questions or concerns.   ZEarlie Server MD  11/18/2016 9:24 AM

## 2016-11-18 NOTE — Progress Notes (Signed)
ON PATHWAY REGIMEN - Non-Small Cell Lung  No Change  Continue With Treatment as Ordered.     Administer weekly:     Paclitaxel      Carboplatin   **Always confirm dose/schedule in your pharmacy ordering system**    Patient Characteristics: Stage III - Unresectable, PS = 0, 1 AJCC T Category: T3 Current Disease Status: No Distant Mets or Local Recurrence AJCC N Category: N2 AJCC M Category: M0 AJCC 8 Stage Grouping: IIIB Performance Status: PS = 0, 1 Intent of Therapy: Curative Intent, Discussed with Patient

## 2016-11-18 NOTE — Progress Notes (Signed)
DISCONTINUE ON PATHWAY REGIMEN - Non-Small Cell Lung     Administer weekly:     Paclitaxel      Carboplatin   **Always confirm dose/schedule in your pharmacy ordering system**    REASON: Continuation Of Treatment PRIOR TREATMENT: [id number]: Carboplatin AUC=2 + Paclitaxel 45 mg/m2 Weekly During Radiation TREATMENT RESPONSE: Unable to Evaluate  START ON PATHWAY REGIMEN - Non-Small Cell Lung     A cycle is every 14 days:     Durvalumab   **Always confirm dose/schedule in your pharmacy ordering system**    Patient Characteristics: Stage III - Unresectable, PS = 0, 1 AJCC T Category: T3 Current Disease Status: No Distant Mets or Local Recurrence AJCC N Category: N2 AJCC M Category: M0 AJCC 8 Stage Grouping: IIIB Performance Status: PS = 0, 1 Intent of Therapy: Curative Intent, Discussed with Patient

## 2016-11-18 NOTE — Progress Notes (Signed)
Patient here today for follow up.  Patient states no new concerns today. Family asking if okay to add apple seeds and apricot seeds to his diet.

## 2016-11-21 ENCOUNTER — Ambulatory Visit
Admission: RE | Admit: 2016-11-21 | Discharge: 2016-11-21 | Disposition: A | Discharge status: Home / Self Care | Payer: Medicare HMO | Source: Ambulatory Visit | Attending: Radiation Oncology | Admitting: Radiation Oncology

## 2016-11-21 DIAGNOSIS — Z510 Encounter for antineoplastic radiation therapy: Secondary | ICD-10-CM | POA: Diagnosis not present

## 2016-11-22 ENCOUNTER — Ambulatory Visit
Admission: RE | Admit: 2016-11-22 | Discharge: 2016-11-22 | Disposition: A | Discharge status: Home / Self Care | Payer: Medicare HMO | Source: Ambulatory Visit | Attending: Radiation Oncology | Admitting: Radiation Oncology

## 2016-11-22 DIAGNOSIS — Z510 Encounter for antineoplastic radiation therapy: Secondary | ICD-10-CM | POA: Diagnosis not present

## 2016-11-23 ENCOUNTER — Ambulatory Visit
Admission: RE | Admit: 2016-11-23 | Discharge: 2016-11-23 | Disposition: A | Discharge status: Home / Self Care | Payer: Medicare HMO | Source: Ambulatory Visit | Attending: Radiation Oncology | Admitting: Radiation Oncology

## 2016-11-23 DIAGNOSIS — Z510 Encounter for antineoplastic radiation therapy: Secondary | ICD-10-CM | POA: Diagnosis not present

## 2016-11-24 ENCOUNTER — Ambulatory Visit
Admission: RE | Admit: 2016-11-24 | Discharge: 2016-11-24 | Disposition: A | Discharge status: Home / Self Care | Payer: Medicare HMO | Source: Ambulatory Visit | Attending: Radiation Oncology | Admitting: Radiation Oncology

## 2016-11-24 DIAGNOSIS — Z510 Encounter for antineoplastic radiation therapy: Secondary | ICD-10-CM | POA: Diagnosis not present

## 2016-11-25 ENCOUNTER — Ambulatory Visit
Admission: RE | Admit: 2016-11-25 | Discharge: 2016-11-25 | Disposition: A | Discharge status: Home / Self Care | Payer: Medicare HMO | Source: Ambulatory Visit | Attending: Radiation Oncology | Admitting: Radiation Oncology

## 2016-11-25 ENCOUNTER — Encounter: Payer: Self-pay | Admitting: Oncology

## 2016-11-25 ENCOUNTER — Ambulatory Visit: Payer: Medicare HMO

## 2016-11-25 ENCOUNTER — Inpatient Hospital Stay: Payer: Medicare HMO

## 2016-11-25 ENCOUNTER — Inpatient Hospital Stay (HOSPITAL_BASED_OUTPATIENT_CLINIC_OR_DEPARTMENT_OTHER): Payer: Medicare HMO | Admitting: Oncology

## 2016-11-25 VITALS — HR 78 | Temp 96.5°F | Resp 18 | Ht 71.0 in | Wt 246.8 lb

## 2016-11-25 DIAGNOSIS — R634 Abnormal weight loss: Secondary | ICD-10-CM

## 2016-11-25 DIAGNOSIS — R911 Solitary pulmonary nodule: Secondary | ICD-10-CM | POA: Diagnosis not present

## 2016-11-25 DIAGNOSIS — Z801 Family history of malignant neoplasm of trachea, bronchus and lung: Secondary | ICD-10-CM

## 2016-11-25 DIAGNOSIS — C3492 Malignant neoplasm of unspecified part of left bronchus or lung: Secondary | ICD-10-CM

## 2016-11-25 DIAGNOSIS — Z79899 Other long term (current) drug therapy: Secondary | ICD-10-CM | POA: Diagnosis not present

## 2016-11-25 DIAGNOSIS — Z87891 Personal history of nicotine dependence: Secondary | ICD-10-CM

## 2016-11-25 DIAGNOSIS — Z510 Encounter for antineoplastic radiation therapy: Secondary | ICD-10-CM | POA: Diagnosis not present

## 2016-11-25 DIAGNOSIS — R1310 Dysphagia, unspecified: Secondary | ICD-10-CM | POA: Diagnosis not present

## 2016-11-25 DIAGNOSIS — C3412 Malignant neoplasm of upper lobe, left bronchus or lung: Secondary | ICD-10-CM

## 2016-11-25 DIAGNOSIS — D696 Thrombocytopenia, unspecified: Secondary | ICD-10-CM

## 2016-11-25 DIAGNOSIS — Z5111 Encounter for antineoplastic chemotherapy: Secondary | ICD-10-CM | POA: Diagnosis not present

## 2016-11-25 DIAGNOSIS — J439 Emphysema, unspecified: Secondary | ICD-10-CM | POA: Diagnosis not present

## 2016-11-25 DIAGNOSIS — N400 Enlarged prostate without lower urinary tract symptoms: Secondary | ICD-10-CM

## 2016-11-25 DIAGNOSIS — R599 Enlarged lymph nodes, unspecified: Secondary | ICD-10-CM | POA: Diagnosis not present

## 2016-11-25 DIAGNOSIS — C3490 Malignant neoplasm of unspecified part of unspecified bronchus or lung: Secondary | ICD-10-CM

## 2016-11-25 LAB — COMPREHENSIVE METABOLIC PANEL
ALBUMIN: 3.6 g/dL (ref 3.5–5.0)
ALK PHOS: 45 U/L (ref 38–126)
ALT: 22 U/L (ref 17–63)
ANION GAP: 5 (ref 5–15)
AST: 17 U/L (ref 15–41)
BILIRUBIN TOTAL: 0.6 mg/dL (ref 0.3–1.2)
BUN: 18 mg/dL (ref 6–20)
CALCIUM: 8.6 mg/dL — AB (ref 8.9–10.3)
CO2: 26 mmol/L (ref 22–32)
Chloride: 106 mmol/L (ref 101–111)
Creatinine, Ser: 1.23 mg/dL (ref 0.61–1.24)
GFR calc non Af Amer: 58 mL/min — ABNORMAL LOW (ref >60)
GLUCOSE: 98 mg/dL (ref 65–99)
Potassium: 4.3 mmol/L (ref 3.5–5.1)
Sodium: 137 mmol/L (ref 135–145)
TOTAL PROTEIN: 6.9 g/dL (ref 6.5–8.1)

## 2016-11-25 LAB — CBC WITH DIFFERENTIAL/PLATELET
Basophils Absolute: 0 10*3/uL (ref 0–0.1)
Basophils Relative: 1 %
Eosinophils Absolute: 0 10*3/uL (ref 0–0.7)
Eosinophils Relative: 1 %
HEMATOCRIT: 41.5 % (ref 40.0–52.0)
HEMOGLOBIN: 13.7 g/dL (ref 13.0–18.0)
LYMPHS ABS: 0.2 10*3/uL — AB (ref 1.0–3.6)
LYMPHS PCT: 9 %
MCH: 28 pg (ref 26.0–34.0)
MCHC: 33 g/dL (ref 32.0–36.0)
MCV: 85 fL (ref 80.0–100.0)
MONOS PCT: 10 %
Monocytes Absolute: 0.2 10*3/uL (ref 0.2–1.0)
NEUTROS ABS: 1.6 10*3/uL (ref 1.4–6.5)
NEUTROS PCT: 79 %
Platelets: 85 10*3/uL — ABNORMAL LOW (ref 150–440)
RBC: 4.88 MIL/uL (ref 4.40–5.90)
RDW: 14.5 % (ref 11.5–14.5)
WBC: 2 10*3/uL — ABNORMAL LOW (ref 3.8–10.6)

## 2016-11-25 MED ORDER — HEPARIN SOD (PORK) LOCK FLUSH 100 UNIT/ML IV SOLN
500.0000 [IU] | Freq: Once | INTRAVENOUS | Status: AC | PRN
  Administered 2016-11-25: 500 [IU]
  Filled 2016-11-25: qty 5

## 2016-11-25 MED ORDER — PALONOSETRON HCL INJECTION 0.25 MG/5ML
0.2500 mg | Freq: Once | INTRAVENOUS | Status: AC
  Administered 2016-11-25: 0.25 mg via INTRAVENOUS
  Filled 2016-11-25: qty 5

## 2016-11-25 MED ORDER — DEXTROSE 5 % IV SOLN
45.0000 mg/m2 | Freq: Once | INTRAVENOUS | Status: AC
  Administered 2016-11-25: 114 mg via INTRAVENOUS
  Filled 2016-11-25: qty 19

## 2016-11-25 MED ORDER — DIPHENHYDRAMINE HCL 50 MG/ML IJ SOLN
50.0000 mg | Freq: Once | INTRAMUSCULAR | Status: AC
  Administered 2016-11-25: 50 mg via INTRAVENOUS
  Filled 2016-11-25: qty 1

## 2016-11-25 MED ORDER — SODIUM CHLORIDE 0.9 % IV SOLN
Freq: Once | INTRAVENOUS | Status: AC
  Administered 2016-11-25: 11:00:00 via INTRAVENOUS
  Filled 2016-11-25: qty 1000

## 2016-11-25 MED ORDER — FAMOTIDINE IN NACL 20-0.9 MG/50ML-% IV SOLN
20.0000 mg | Freq: Once | INTRAVENOUS | Status: AC
  Administered 2016-11-25: 20 mg via INTRAVENOUS

## 2016-11-25 MED ORDER — SODIUM CHLORIDE 0.9 % IV SOLN
20.0000 mg | Freq: Once | INTRAVENOUS | Status: AC
  Administered 2016-11-25: 20 mg via INTRAVENOUS
  Filled 2016-11-25: qty 2

## 2016-11-25 MED ORDER — CARBOPLATIN CHEMO INJECTION 450 MG/45ML
239.4000 mg | Freq: Once | INTRAVENOUS | Status: AC
  Administered 2016-11-25: 240 mg via INTRAVENOUS
  Filled 2016-11-25: qty 24

## 2016-11-25 NOTE — Progress Notes (Addendum)
[place] [facility] Follow Up Visit  Patient Care Team: Adline Potter, MD as PCP - General (Family Medicine) Telford Nab, RN as Registered Nurse  REASON FOR VISIT:  Follow up for evaluation for chemotherapy.   HISTORY OF PRESENT ILLNESS/ PERTINENT ONCOLOGY HISTORY 1  [name] 71 y.o. African American male previous healthy, former smoker who is referred to me for evaluation and management of newly diagnosed lung cancer. He gets his usual care at [facility]. He presented last month to urgent care due to coughing and hemoptysis. X ray showed left lung mass. Cough up small amount of blood mixed in the sputum. Denies SOB, chest pain, back pain, abdominal pain. Denies headache, vision changes. He works a Administrator.  2 Images: PET was done which showed hypermetabolic left upper lobe mass as well as the adjacent nodule, ipsilateral hilar and mediastinal lymphadenopathy.   MRI brain showed no intracranial mets. CT showed LUL mass with a satellite lesion as well as mediastinal adenopathy.   3 Pathology: TPS 50%. 09/22/2016 left upper lobe biopsy showed squamous cell carcinoma. Foundation one test: ordered prior to patient first visit. Showed NO EGFR, ROS1, ALK, BRAF, MET mutation.  Mr.Trystin Nardelli who has above history reviewed and edited by me presents for cycle 6 weekly carbo Taxol /RT for Stage III lung squamous cell. He is on concurrent radiation with last treatment date plan as October 19.he reports feeling fine.  He has intermittent cough: not alleviated with anything, no associated with hemoptysis, stable Weight loss another 2 pounds in the past week despite having appetite is good.  Throat discomfort, resolved with magic mouth wash He denies fatigue, fever or chills, SOB,nausea/vomiting,  hemoptysis, abdominal pain. 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   Gastrointestinal: Negative abdominal pain, diarrhea, nausea vomiting Endocrine: Negative  Genitourinary: Negative for dysuria, hematuria, frequency Skin: Negative for rash, iching, bruising Neurological: Negative for headache, dizziness, seizure Hematological: Negative for easy bruising/bleeding, lymph node enlargement Psychiatric/Behavioral: Negative for depression, anxiety, suicidality  MEDICAL HISTORY: Past Medical History:  Diagnosis Date  . Enlarged prostate     SURGICAL HISTORY: Past Surgical History:  Procedure Laterality Date  . NO PAST SURGERIES    . PORTACATH PLACEMENT Right 10/05/2016   Procedure: INSERTION PORT-A-CATH;  Surgeon: [name], MD;  Location: [facility] ORS;  Service: General;  Laterality: Right;    SOCIAL HISTORY: Social History   Social History  . Marital status: Single    Spouse name: N/A  . Number of children: N/A  . Years of education: N/A   Occupational History  . Not on file.   Social History Main Topics  . Smoking status: Former Smoker    Packs/day: 1.00    Years: 15.00    Types: Cigarettes    Quit date: 09/28/1992  . Smokeless tobacco: Never Used  . Alcohol use No  . Drug use: No  . Sexual activity: Not on file   Other Topics Concern  . Not on file   Social History Narrative  . No narrative on file    FAMILY HISTORY Family History  Problem Relation Age of Onset  . Lymphoma Mother   . Diabetes Mother   .  Lung cancer Sister   . Lung cancer Brother   . Lung cancer Brother     ALLERGIES:  has No Known Allergies.  MEDICATIONS:  Current Outpatient Prescriptions  Medication Sig Dispense Refill  . Cholecalciferol (VITAMIN D3) 2000 units capsule Take 1 capsule (2,000 Units total) by mouth daily.    .  cyanocobalamin 500 MCG tablet Take 500 mcg by mouth daily.    Marland Kitchen dexamethasone (DECADRON) 4 MG tablet Take 2 tablets (8 mg total) by mouth daily. Start the day after chemotherapy for 2 days. 30 tablet 1  . lidocaine-prilocaine (EMLA) cream Apply to affected area once 30 g 3  . magic mouthwash w/lidocaine SOLN Take 5 mLs by mouth 4 (four) times daily. 80 ml viscous lidocaine 2%, 80 ml Mylanta, 80 ml Diphenhydramine 12.5 mg/5 ml Elixir, 80 ml Nystatin 100,000 Unit suspension, 80 ml Prednisolone 15 mg/25m, 80 ml Distilled Water. Sig: Swish/Swallow 5-10 ml four times a day as needed. Dispense 480 ml. 3RFs 480 mL 3  . mupirocin ointment (BACTROBAN) 2 % Apply 1 application topically 2 (two) times daily as needed. 22 g 2  . omeprazole (PRILOSEC) 40 MG capsule Take 1 capsule (40 mg total) by mouth daily. 30 capsule 3  . zolpidem (AMBIEN) 5 MG tablet Take 1 tablet (5 mg total) by mouth at bedtime as needed for sleep. 30 tablet 0  . ondansetron (ZOFRAN) 8 MG tablet Take 1 tablet (8 mg total) by mouth 2 (two) times daily as needed for refractory nausea / vomiting. Start on day 3 after chemo. (Patient not taking: Reported on 11/25/2016) 30 tablet 1  . prochlorperazine (COMPAZINE) 10 MG tablet Take 1 tablet (10 mg total) by mouth every 6 (six) hours as needed (Nausea or vomiting). (Patient not taking: Reported on 11/25/2016) 30 tablet 1   No current facility-administered medications for this visit.     PHYSICAL EXAMINATION:  ECOG PERFORMANCE STATUS: 0 - Asymptomatic   Vitals:   11/25/16 1000  Pulse: 78  Resp: 18  Temp: (!) 96.5 F (35.8 C)   Filed Weights   11/25/16 1000  Weight: 246 lb 12.8 oz (111.9 kg)  Physical Exam  GENERAL: No distress, well nourished.  SKIN:  No rashes or significant lesions  HEAD: Normocephalic, No masses, lesions, tenderness or abnormalities  EYES: Conjunctiva are pink, non icteric ENT: External ears normal ,lips , buccal mucosa, and tongue normal and mucous membranes  are moist  LYMPH: No palpable cervical and axillary lymphadenopathy  LUNGS: Clear to auscultation, no crackles or wheezes HEART: Regular rate & rhythm, no murmurs, no gallops, S1 normal and S2 normal  ABDOMEN: Abdomen soft, non-tender, normal bowel sounds, I did not appreciate any  masses or organomegaly  MUSCULOSKELETAL: No CVA tenderness and no tenderness on percussion of the back or rib cage.  EXTREMITIES: No edema, no skin discoloration or tenderness NEURO: Alert & oriented, no focal motor/sensory deficits.   LABORATORY DATA: I have personally reviewed the data as listed: CBC    Component Value Date/Time   WBC 2.0 (L) 11/25/2016 0908   RBC 4.88 11/25/2016 0908   HGB 13.7 11/25/2016 0908   HCT 41.5 11/25/2016 0908   PLT 85 (L) 11/25/2016 0908   MCV 85.0 11/25/2016 0908   MCH 28.0 11/25/2016 0908   MCHC 33.0 11/25/2016 0908   RDW 14.5 11/25/2016 0908   LYMPHSABS 0.2 (L) 11/25/2016 0908   MONOABS 0.2 11/25/2016 0908   EOSABS 0.0 11/25/2016 0908   BASOSABS 0.0 11/25/2016 0908  CMP Latest Ref Rng & Units 11/25/2016 11/18/2016 11/11/2016  Glucose 65 - 99 mg/dL 98 94 95  BUN 6 - 20 mg/dL 18 20 22(H)  Creatinine 0.61 - 1.24 mg/dL 1.23 1.17 1.14  Sodium 135 - 145 mmol/L 137 136 134(L)  Potassium 3.5 - 5.1 mmol/L 4.3 4.5 4.7  Chloride 101 - 111 mmol/L 106 107 106  CO2 22 - 32 mmol/L _0 Calcium 8.9 - 10.3 mg/dL 8.6(L) 9.0 8.9  Total Protein 6.5 - 8.1 g/dL 6.9 7.2 7.6  Total Bilirubin 0.3 - 1.2 mg/dL 0.6 1.0 1.1  Alkaline Phos 38 - 126 U/L 45 48 48  AST 15 - 41 U/L _1 ALT 17 - 63 U/L _2 RADIOGRAPHIC STUDIES: I have personally reviewed the radiological images as listed and agree with the findings in the report CT chest w contrast 08/31/2016 IMPRESSION: 1. Left peripheral upper lobe mass measuring up to 3.5 cm, likely primary lung cancer. 2. 8 mm satellite nodule just anterior to the mass. 3. Aortopulmonary window lymphadenopathy, possibly  metastatic. 4. Nonspecific 6 mm right lower lobe pulmonary nodule. 5. Moderate emphysema and mild peripheral basilar fibrosis  PET scan 09/06/2016 IMPRESSION: Hypermetabolic 3.4 cm left upper lobe mass with adjacent sub-cm satellite nodule, consistent with primary bronchogenic carcinoma. Hypermetabolic left hilar and mediastinal AP window lymphadenopathy, consistent with metastatic disease.No evidence of distant metastatic disease.  Pathology 09/22/2016 Surgical Pathology  CASE: [id number]  PATIENT: [name]  Surgical Pathology Report  SPECIMEN SUBMITTED:  A. Lung, LUL pulmonary mass  DIAGNOSIS:  A. LUNG MASS, LEFT UPPER LOBE; CT GUIDED BIOPSY:  - SQUAMOUS CELL CARCINOMA.   ASSESSMENT/PLAN Cancer Staging Squamous cell lung cancer, left [facility]) Staging form: Lung, AJCC 8th Edition - Clinical stage from 09/27/2016: Stage IIIA (cT2a, cN2, cM0) - Signed by Earlie Server, MD on 09/29/2016  1. Squamous cell lung cancer, left ([place])   2. Encounter for antineoplastic chemotherapy   3. Malignant neoplasm of unspecified part of unspecified bronchus or lung ([place])   4. Thrombocytopenia ([place])   . 1 On concurrent chemotherapy with RT, to be followed maintenance with immunotherapy.  Regimen: Carbo AUC 2 and Taxol 31m/m2 weekly with RT, plan Durvalumab consolidation after finishing chemo/RT.  Proceed cycle 6 treatment today. As his radiation treatment last day his next week, plan extend 1 more dose of weekly carbo and Taxol. Plan repeat CT scan chest as a new baseline after chemotherapy and radiation before starting his immunotherapy Discussed about the immunotherapy as consolidation treatment.  I discussed the mechanism of action;  Discussed the potential side effects of immunotherapy including but not limited to diarrhea; skin rash; elevated LFTs/endocrine abnormalities etc. Patient agrees to proceed after he finished concurrent chemotherapy and radiation.  3:  Thrombocytopenia, likely  chemotherapy and radiation-induced. Today's count 85,000 without any signs of bleeding. We'll proceed with today's treatment. He will have CBC done in 1 week to decide if he is able to tolerate final dose of Carbo and Taxol.  4  Weight loss: continue to monitor. Appetite is good. TSH was checked and was normal. I will see him on Day 1 of his cycle 7 weekly concurrent chemotherapy. CBC CMP prior to visit.   Orders Placed This Encounter  Procedures  . CT Chest W Contrast    Standing Status:   Future    Standing Expiration Date:   11/25/2017    Order Specific Question:   If indicated for the ordered procedure,  I authorize the administration of contrast media per Radiology protocol    Answer:   Yes    Order Specific Question:   Preferred imaging location?    Answer:   [facility]    Order Specific Question:   Radiology Contrast Protocol - do NOT remove file path    Answer:   [email or web address]   All questions were answered. The patient knows to call the clinic with any problems, questions or concerns.   Earlie Server, MD  11/25/2016 3:39 PM

## 2016-11-25 NOTE — Progress Notes (Signed)
Pt states he has a good appetite, no pain, today is last chemo per pt. Bowels working good.

## 2016-11-25 NOTE — Progress Notes (Signed)
Notified Dr. Tasia Catchings of [name]'s platelet count of 85. Dr.Yu still would like for [place] to proceed chemotherapy treatment today.

## 2016-11-28 ENCOUNTER — Ambulatory Visit
Admission: RE | Admit: 2016-11-28 | Discharge: 2016-11-28 | Disposition: A | Discharge status: Home / Self Care | Payer: Medicare HMO | Source: Ambulatory Visit | Attending: Radiation Oncology | Admitting: Radiation Oncology

## 2016-11-28 DIAGNOSIS — Z510 Encounter for antineoplastic radiation therapy: Secondary | ICD-10-CM | POA: Diagnosis not present

## 2016-11-29 ENCOUNTER — Ambulatory Visit
Admission: RE | Admit: 2016-11-29 | Discharge: 2016-11-29 | Disposition: A | Discharge status: Home / Self Care | Payer: Medicare HMO | Source: Ambulatory Visit | Attending: Radiation Oncology | Admitting: Radiation Oncology

## 2016-11-29 DIAGNOSIS — Z510 Encounter for antineoplastic radiation therapy: Secondary | ICD-10-CM | POA: Diagnosis not present

## 2016-11-30 ENCOUNTER — Ambulatory Visit
Admission: RE | Admit: 2016-11-30 | Discharge: 2016-11-30 | Disposition: A | Discharge status: Home / Self Care | Payer: Medicare HMO | Source: Ambulatory Visit | Attending: Radiation Oncology | Admitting: Radiation Oncology

## 2016-11-30 DIAGNOSIS — Z510 Encounter for antineoplastic radiation therapy: Secondary | ICD-10-CM | POA: Diagnosis not present

## 2016-12-01 ENCOUNTER — Ambulatory Visit: Payer: Medicare HMO

## 2016-12-01 ENCOUNTER — Ambulatory Visit
Admission: RE | Admit: 2016-12-01 | Discharge: 2016-12-01 | Disposition: A | Discharge status: Home / Self Care | Payer: Medicare HMO | Source: Ambulatory Visit | Attending: Radiation Oncology | Admitting: Radiation Oncology

## 2016-12-01 DIAGNOSIS — Z510 Encounter for antineoplastic radiation therapy: Secondary | ICD-10-CM | POA: Diagnosis not present

## 2016-12-02 ENCOUNTER — Encounter: Payer: Self-pay | Admitting: Oncology

## 2016-12-02 ENCOUNTER — Ambulatory Visit
Admission: RE | Admit: 2016-12-02 | Discharge: 2016-12-02 | Disposition: A | Discharge status: Home / Self Care | Payer: Medicare HMO | Source: Ambulatory Visit | Attending: Radiation Oncology | Admitting: Radiation Oncology

## 2016-12-02 ENCOUNTER — Inpatient Hospital Stay: Payer: Medicare HMO

## 2016-12-02 ENCOUNTER — Ambulatory Visit: Payer: Medicare HMO

## 2016-12-02 ENCOUNTER — Inpatient Hospital Stay (HOSPITAL_BASED_OUTPATIENT_CLINIC_OR_DEPARTMENT_OTHER): Payer: Medicare HMO | Admitting: Oncology

## 2016-12-02 VITALS — BP 131/76 | HR 82 | Temp 94.3°F | Resp 18 | Wt 249.5 lb

## 2016-12-02 VITALS — BP 146/81 | HR 66 | Resp 20

## 2016-12-02 DIAGNOSIS — J439 Emphysema, unspecified: Secondary | ICD-10-CM | POA: Diagnosis not present

## 2016-12-02 DIAGNOSIS — C3492 Malignant neoplasm of unspecified part of left bronchus or lung: Secondary | ICD-10-CM

## 2016-12-02 DIAGNOSIS — R1310 Dysphagia, unspecified: Secondary | ICD-10-CM

## 2016-12-02 DIAGNOSIS — N400 Enlarged prostate without lower urinary tract symptoms: Secondary | ICD-10-CM

## 2016-12-02 DIAGNOSIS — Z87891 Personal history of nicotine dependence: Secondary | ICD-10-CM | POA: Diagnosis not present

## 2016-12-02 DIAGNOSIS — D696 Thrombocytopenia, unspecified: Secondary | ICD-10-CM

## 2016-12-02 DIAGNOSIS — Z5111 Encounter for antineoplastic chemotherapy: Secondary | ICD-10-CM | POA: Diagnosis not present

## 2016-12-02 DIAGNOSIS — C3412 Malignant neoplasm of upper lobe, left bronchus or lung: Secondary | ICD-10-CM | POA: Diagnosis not present

## 2016-12-02 DIAGNOSIS — Z801 Family history of malignant neoplasm of trachea, bronchus and lung: Secondary | ICD-10-CM

## 2016-12-02 DIAGNOSIS — Z510 Encounter for antineoplastic radiation therapy: Secondary | ICD-10-CM | POA: Diagnosis not present

## 2016-12-02 DIAGNOSIS — Z79899 Other long term (current) drug therapy: Secondary | ICD-10-CM

## 2016-12-02 DIAGNOSIS — R599 Enlarged lymph nodes, unspecified: Secondary | ICD-10-CM | POA: Diagnosis not present

## 2016-12-02 DIAGNOSIS — R911 Solitary pulmonary nodule: Secondary | ICD-10-CM

## 2016-12-02 LAB — COMPREHENSIVE METABOLIC PANEL
ALBUMIN: 3.4 g/dL — AB (ref 3.5–5.0)
ALT: 20 U/L (ref 17–63)
AST: 19 U/L (ref 15–41)
Alkaline Phosphatase: 45 U/L (ref 38–126)
Anion gap: 6 (ref 5–15)
BUN: 16 mg/dL (ref 6–20)
CHLORIDE: 108 mmol/L (ref 101–111)
CO2: 24 mmol/L (ref 22–32)
Calcium: 8.7 mg/dL — ABNORMAL LOW (ref 8.9–10.3)
Creatinine, Ser: 1.1 mg/dL (ref 0.61–1.24)
GFR calc Af Amer: >60 mL/min (ref >60)
GFR calc non Af Amer: >60 mL/min (ref >60)
GLUCOSE: 113 mg/dL — AB (ref 65–99)
POTASSIUM: 4.1 mmol/L (ref 3.5–5.1)
Sodium: 138 mmol/L (ref 135–145)
Total Bilirubin: 0.7 mg/dL (ref 0.3–1.2)
Total Protein: 6.6 g/dL (ref 6.5–8.1)

## 2016-12-02 LAB — CBC WITH DIFFERENTIAL/PLATELET
BASOS ABS: 0 10*3/uL (ref 0–0.1)
BASOS PCT: 0 %
EOS PCT: 1 %
Eosinophils Absolute: 0 10*3/uL (ref 0–0.7)
HCT: 40.4 % (ref 40.0–52.0)
Hemoglobin: 13.2 g/dL (ref 13.0–18.0)
Lymphocytes Relative: 9 %
Lymphs Abs: 0.1 10*3/uL — ABNORMAL LOW (ref 1.0–3.6)
MCH: 27.9 pg (ref 26.0–34.0)
MCHC: 32.8 g/dL (ref 32.0–36.0)
MCV: 85.1 fL (ref 80.0–100.0)
Monocytes Absolute: 0.2 10*3/uL (ref 0.2–1.0)
Monocytes Relative: 11 %
NEUTROS ABS: 1.3 10*3/uL — AB (ref 1.4–6.5)
Neutrophils Relative %: 79 %
PLATELETS: 86 10*3/uL — AB (ref 150–440)
RBC: 4.74 MIL/uL (ref 4.40–5.90)
RDW: 14.5 % (ref 11.5–14.5)
WBC: 1.6 10*3/uL — ABNORMAL LOW (ref 3.8–10.6)

## 2016-12-02 MED ORDER — DIPHENHYDRAMINE HCL 50 MG/ML IJ SOLN
50.0000 mg | Freq: Once | INTRAMUSCULAR | Status: AC
  Administered 2016-12-02: 50 mg via INTRAVENOUS
  Filled 2016-12-02: qty 1

## 2016-12-02 MED ORDER — SODIUM CHLORIDE 0.9 % IV SOLN
Freq: Once | INTRAVENOUS | Status: AC
  Administered 2016-12-02: 10:00:00 via INTRAVENOUS
  Filled 2016-12-02: qty 1000

## 2016-12-02 MED ORDER — SODIUM CHLORIDE 0.9 % IV SOLN
20.0000 mg | Freq: Once | INTRAVENOUS | Status: AC
  Administered 2016-12-02: 20 mg via INTRAVENOUS
  Filled 2016-12-02: qty 2

## 2016-12-02 MED ORDER — HEPARIN SOD (PORK) LOCK FLUSH 100 UNIT/ML IV SOLN
500.0000 [IU] | Freq: Once | INTRAVENOUS | Status: AC
  Administered 2016-12-02: 500 [IU] via INTRAVENOUS
  Filled 2016-12-02: qty 5

## 2016-12-02 MED ORDER — SODIUM CHLORIDE 0.9% FLUSH
10.0000 mL | INTRAVENOUS | Status: DC | PRN
  Administered 2016-12-02: 10 mL via INTRAVENOUS
  Filled 2016-12-02: qty 10

## 2016-12-02 MED ORDER — PACLITAXEL CHEMO INJECTION 300 MG/50ML
45.0000 mg/m2 | Freq: Once | INTRAVENOUS | Status: AC
  Administered 2016-12-02: 114 mg via INTRAVENOUS
  Filled 2016-12-02: qty 19

## 2016-12-02 MED ORDER — SODIUM CHLORIDE 0.9 % IV SOLN
240.0000 mg | Freq: Once | INTRAVENOUS | Status: AC
  Administered 2016-12-02: 240 mg via INTRAVENOUS
  Filled 2016-12-02: qty 24

## 2016-12-02 MED ORDER — PALONOSETRON HCL INJECTION 0.25 MG/5ML
0.2500 mg | Freq: Once | INTRAVENOUS | Status: AC
  Administered 2016-12-02: 0.25 mg via INTRAVENOUS
  Filled 2016-12-02: qty 5

## 2016-12-02 MED ORDER — FAMOTIDINE IN NACL 20-0.9 MG/50ML-% IV SOLN
20.0000 mg | Freq: Once | INTRAVENOUS | Status: AC
  Administered 2016-12-02: 20 mg via INTRAVENOUS
  Filled 2016-12-02: qty 50

## 2016-12-02 NOTE — Progress Notes (Signed)
[facility] Follow Up Visit  Patient Care Team: Adline Potter, MD as PCP - General (Family Medicine) Telford Nab, RN as Registered Nurse  REASON FOR VISIT:  Follow up for evaluation prior to cycle 7 , current chemotherapy. Radiation  HISTORY OF PRESENT ILLNESS/ PERTINENT ONCOLOGY HISTORY 1  [name] 71 y.o. African American male previous healthy, former smoker who presents for treatment of lung cancer. He gets his usual care at [facility].  His cancer history listed as below.  He presented last month to urgent care due to coughing and hemoptysis. X ray showed left lung mass. Cough up small amount of blood mixed in the sputum. Denies SOB, chest pain, back pain, abdominal pain. Denies headache, vision changes. He works a Administrator.  2 Images: PET was done which showed hypermetabolic left upper lobe mass as well as the adjacent nodule, ipsilateral hilar and mediastinal lymphadenopathy.   MRI brain showed no intracranial mets. CT showed LUL mass with a satellite lesion as well as mediastinal adenopathy.   3 Pathology: TPS 50%. 09/22/2016 left upper lobe biopsy showed squamous cell carcinoma. Foundation one test: ordered prior to patient first visit. Showed NO EGFR, ROS1, ALK, BRAF, MET mutation.   Mr.[name] who has above history reviewed and edited by me presents for cycle 7 weekly carbo Taxol /RT for Stage III lung squamous cell. This is his last weekly treatment He is on concurrent radiation with last treatment date plan as October 19. Reports feeling well. Denies any easy bruising or bleeding events. He has intermittent cough:stable cough is intermittent. No hemoptysis. Weight loss: He has gained 3 pounds since last visit. Appetite is good. He denies fatigue, fever or chills, SOB,nausea/vomiting,  hemoptysis, abdominal pain. 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   HENT: Negative for ear pain, hearing loss, nasal bleeding Eyes: Negative for eye pain, double vision   Respiratory: Negative for wheezing, shortness of breath, positive intermittent dry cough Cardiovascular: Negative for chest pain, palpitation.   Gastrointestinal: Negative abdominal pain, diarrhea, nausea vomiting Endocrine: Negative  Genitourinary: Negative for dysuria, hematuria, frequency Skin: Negative for rash, iching, bruising Neurological: Negative for headache, dizziness, seizure Hematological: Negative for easy bruising/bleeding, lymph node enlargement Psychiatric/Behavioral: Negative for depression, anxiety, suicidality MEDICAL HISTORY: Past Medical History:  Diagnosis Date  . Enlarged prostate     SURGICAL HISTORY: Past Surgical History:  Procedure Laterality Date  . NO PAST SURGERIES    . PORTACATH PLACEMENT Right 10/05/2016   Procedure: INSERTION PORT-A-CATH;  Surgeon: [name], MD;  Location: [facility] ORS;  Service: General;  Laterality: Right;    SOCIAL HISTORY: Social History   Social History  . Marital status: Single    Spouse name: N/A  . Number of children: N/A  . Years of education: N/A   Occupational History  . Not on file.   Social History Main Topics  . Smoking status: Former Smoker    Packs/day: 1.00    Years: 15.00    Types: Cigarettes    Quit date: 09/28/1992  . Smokeless tobacco: Never Used  . Alcohol use No  . Drug use: No  . Sexual activity: Not on file   Other Topics Concern  . Not on file   Social History Narrative  . No narrative on file    FAMILY HISTORY Family History  Problem Relation Age of Onset  .  Lymphoma Mother   . Diabetes Mother   . Lung cancer Sister   . Lung cancer Brother   . Lung cancer Brother     ALLERGIES:  has No Known Allergies.  MEDICATIONS:  Current Outpatient Prescriptions  Medication Sig Dispense Refill  . Cholecalciferol (VITAMIN D3) 2000 units capsule Take 1  capsule (2,000 Units total) by mouth daily.    . cyanocobalamin 500 MCG tablet Take 500 mcg by mouth daily.    Marland Kitchen dexamethasone (DECADRON) 4 MG tablet Take 2 tablets (8 mg total) by mouth daily. Start the day after chemotherapy for 2 days. 30 tablet 1  . lidocaine-prilocaine (EMLA) cream Apply to affected area once 30 g 3  . magic mouthwash w/lidocaine SOLN Take 5 mLs by mouth 4 (four) times daily. 80 ml viscous lidocaine 2%, 80 ml Mylanta, 80 ml Diphenhydramine 12.5 mg/5 ml Elixir, 80 ml Nystatin 100,000 Unit suspension, 80 ml Prednisolone 15 mg/46m, 80 ml Distilled Water. Sig: Swish/Swallow 5-10 ml four times a day as needed. Dispense 480 ml. 3RFs 480 mL 3  . mupirocin ointment (BACTROBAN) 2 % Apply 1 application topically 2 (two) times daily as needed. 22 g 2  . omeprazole (PRILOSEC) 40 MG capsule Take 1 capsule (40 mg total) by mouth daily. 30 capsule 3  . ondansetron (ZOFRAN) 8 MG tablet Take 1 tablet (8 mg total) by mouth 2 (two) times daily as needed for refractory nausea / vomiting. Start on day 3 after chemo. (Patient not taking: Reported on 11/25/2016) 30 tablet 1  . prochlorperazine (COMPAZINE) 10 MG tablet Take 1 tablet (10 mg total) by mouth every 6 (six) hours as needed (Nausea or vomiting). (Patient not taking: Reported on 11/25/2016) 30 tablet 1  . zolpidem (AMBIEN) 5 MG tablet Take 1 tablet (5 mg total) by mouth at bedtime as needed for sleep. 30 tablet 0   No current facility-administered medications for this visit.    Facility-Administered Medications Ordered in Other Visits  Medication Dose Route Frequency Provider Last Rate Last Dose  . heparin lock flush 100 unit/mL  500 Units Intravenous Once YEarlie Server MD      . sodium chloride flush (NS) 0.9 % injection 10 mL  10 mL Intravenous PRN YEarlie Server MD   10 mL at 12/02/16 0826    PHYSICAL EXAMINATION:  ECOG PERFORMANCE STATUS: 0 - Asymptomatic   Vitals:   12/02/16 0910  BP: 131/76  Pulse: 82  Resp: 18  Temp: (!) 94.3 F  (34.6 C)   Filed Weights   12/02/16 0910  Weight: 249 lb 8 oz (113.2 kg)  Physical Exam  GENERAL: No distress, well nourished.  SKIN:  No rashes or significant lesions  HEAD: Normocephalic, No masses, lesions, tenderness or abnormalities  EYES: Conjunctiva are pink, non icteric ENT: External ears normal ,lips , buccal mucosa, and tongue normal and mucous membranes are moist  LYMPH: No palpable cervical and axillary lymphadenopathy  LUNGS: Clear to auscultation, no crackles or wheezes HEART: Regular rate & rhythm, no murmurs, no gallops, S1 normal and S2 normal  ABDOMEN: Abdomen soft, non-tender, normal bowel sounds, I did not appreciate any  masses or organomegaly  MUSCULOSKELETAL: No CVA tenderness and no tenderness on percussion of the back or rib cage.  EXTREMITIES: No edema, no skin discoloration or tenderness NEURO: Alert & oriented, no focal motor/sensory deficits.   LABORATORY DATA: I have personally reviewed the data as listed: CBC    Component Value Date/Time   WBC 2.0 (L) 11/25/2016 04782  RBC 4.88 11/25/2016 0908   HGB 13.7 11/25/2016 0908   HCT 41.5 11/25/2016 0908   PLT 85 (L) 11/25/2016 0908   MCV 85.0 11/25/2016 0908   MCH 28.0 11/25/2016 0908   MCHC 33.0 11/25/2016 0908   RDW 14.5 11/25/2016 0908   LYMPHSABS 0.2 (L) 11/25/2016 0908   MONOABS 0.2 11/25/2016 0908   EOSABS 0.0 11/25/2016 0908   BASOSABS 0.0 11/25/2016 0908    CMP Latest Ref Rng & Units 11/25/2016 11/18/2016 11/11/2016  Glucose 65 - 99 mg/dL 98 94 95  BUN 6 - 20 mg/dL 18 20 22(H)  Creatinine 0.61 - 1.24 mg/dL 1.23 1.17 1.14  Sodium 135 - 145 mmol/L 137 136 134(L)  Potassium 3.5 - 5.1 mmol/L 4.3 4.5 4.7  Chloride 101 - 111 mmol/L 106 107 106  CO2 22 - 32 mmol/L '26 24 23  ' Calcium 8.9 - 10.3 mg/dL 8.6(L) 9.0 8.9  Total Protein 6.5 - 8.1 g/dL 6.9 7.2 7.6  Total Bilirubin 0.3 - 1.2 mg/dL 0.6 1.0 1.1  Alkaline Phos 38 - 126 U/L 45 48 48  AST 15 - 41 U/L '17 20 17  ' ALT 17 - 63 U/L '22 26 22     ' RADIOGRAPHIC STUDIES: I have personally reviewed the radiological images as listed and agree with the findings in the report CT chest w contrast 08/31/2016 IMPRESSION: 1. Left peripheral upper lobe mass measuring up to 3.5 cm, likely primary lung cancer. 2. 8 mm satellite nodule just anterior to the mass. 3. Aortopulmonary window lymphadenopathy, possibly metastatic. 4. Nonspecific 6 mm right lower lobe pulmonary nodule. 5. Moderate emphysema and mild peripheral basilar fibrosis  PET scan 09/06/2016 IMPRESSION: Hypermetabolic 3.4 cm left upper lobe mass with adjacent sub-cm satellite nodule, consistent with primary bronchogenic carcinoma. Hypermetabolic left hilar and mediastinal AP window lymphadenopathy, consistent with metastatic disease.No evidence of distant metastatic disease.  Pathology 09/22/2016 Surgical Pathology  CASE: [id number]  PATIENT: [name]  Surgical Pathology Report  SPECIMEN SUBMITTED:  A. Lung, LUL pulmonary mass  DIAGNOSIS:  A. LUNG MASS, LEFT UPPER LOBE; CT GUIDED BIOPSY:  - SQUAMOUS CELL CARCINOMA.   ASSESSMENT/PLAN Cancer Staging Squamous cell lung cancer, left [facility]) Staging form: Lung, AJCC 8th Edition - Clinical stage from 09/27/2016: Stage IIIA (cT2a, cN2, cM0) - Signed by Earlie Server, MD on 09/29/2016  1. Squamous cell lung cancer, left ([place])   2. Encounter for antineoplastic chemotherapy   3. Thrombocytopenia ([place])   . # On concurrent chemotherapy with RT, to be followed maintenance with immunotherapy.  Regimen: Carbo AUC 2 and Taxol 65m/m2 weekly with RT, plan Durvalumab consolidation after finishing chemo/RT.  Proceed cycle 7 treatment today, and this will be the last day of concurrent chemotherapy and Last day of radiation treatment.  Plan to start durvalumab consolidation in 3 weeks. Plan repeat CT scan chest as a new baseline after chemotherapy and radiation before starting his immunotherapy. Plan to have CT scan done about 3 weeks  after the last dose of treatment. Discussed with patient that this repeat of scan is not for evaluation of treatment response, as it usually takes 4-6 weeks to see treatment response. The purpose of this scan is to establish a new baseline before we start immunotherapy. Discussed with Dr. CBaruch Goutyand he agrees with the plan. Patient voices understanding and agrees with the plan.  # Thrombocytopenia, likely chemotherapy and radiation-induced. Today's count 86,000 without any signs of bleeding. We'll proceed with today's treatment. Discussed with patient that if he experienced any easy  bruising, or any bleeding events he needs to seek medical device immediately. #  Weight loss: Resolved. Appetite is good. He has gained 3 pounds  I will see him on 12/23/16 Day 1 of cycle 1 durvalumab consolidation treatment. CBC CMP, TSH prior to visit.   All questions were answered. The patient knows to call the clinic with any problems, questions or concerns.   Earlie Server, MD  12/02/2016 8:30 AM

## 2016-12-02 NOTE — Progress Notes (Signed)
ANC =1.3, plt =86.  MD ok to proceed with treatment today

## 2016-12-02 NOTE — Progress Notes (Signed)
Here for follow up

## 2016-12-05 ENCOUNTER — Ambulatory Visit: Payer: Medicare HMO

## 2016-12-15 ENCOUNTER — Ambulatory Visit: Payer: Medicare HMO

## 2016-12-16 ENCOUNTER — Other Ambulatory Visit: Payer: Medicare HMO

## 2016-12-16 ENCOUNTER — Ambulatory Visit: Payer: Medicare HMO

## 2016-12-16 ENCOUNTER — Ambulatory Visit: Payer: Medicare HMO | Admitting: Oncology

## 2016-12-22 ENCOUNTER — Ambulatory Visit
Admission: RE | Admit: 2016-12-22 | Discharge: 2016-12-22 | Disposition: A | Discharge status: Home / Self Care | Payer: Medicare HMO | Source: Ambulatory Visit | Attending: Oncology | Admitting: Oncology

## 2016-12-22 DIAGNOSIS — I2510 Atherosclerotic heart disease of native coronary artery without angina pectoris: Secondary | ICD-10-CM | POA: Diagnosis not present

## 2016-12-22 DIAGNOSIS — C3490 Malignant neoplasm of unspecified part of unspecified bronchus or lung: Secondary | ICD-10-CM | POA: Diagnosis not present

## 2016-12-22 DIAGNOSIS — N281 Cyst of kidney, acquired: Secondary | ICD-10-CM | POA: Insufficient documentation

## 2016-12-22 DIAGNOSIS — R591 Generalized enlarged lymph nodes: Secondary | ICD-10-CM | POA: Diagnosis not present

## 2016-12-22 DIAGNOSIS — I700 Atherosclerosis of aorta: Secondary | ICD-10-CM | POA: Diagnosis not present

## 2016-12-22 DIAGNOSIS — J439 Emphysema, unspecified: Secondary | ICD-10-CM | POA: Insufficient documentation

## 2016-12-22 MED ORDER — IOPAMIDOL (ISOVUE-300) INJECTION 61%
75.0000 mL | Freq: Once | INTRAVENOUS | Status: AC | PRN
  Administered 2016-12-22: 75 mL via INTRAVENOUS

## 2016-12-22 NOTE — Progress Notes (Signed)
[place] [facility] Follow Up Visit  Patient Care Team: Adline Potter, MD as PCP - General (Family Medicine) Telford Nab, RN as Registered Nurse  REASON FOR VISIT:  Follow up for evaluation prior to maintenance immunotherapy  HISTORY OF PRESENT ILLNESS/ PERTINENT ONCOLOGY HISTORY #  [name] 71 y.o. African American male previous healthy, former smoker who presents for treatment of lung cancer. He gets his usual care at [facility]  His cancer history listed as below.  He presented last month to urgent care due to coughing and hemoptysis. X ray showed left lung mass. Cough up small amount of blood mixed in the sputum. Denies SOB, chest pain, back pain, abdominal pain. Denies headache, vision changes. He works a Administrator.  # Images:  PET 09/06/2016  showed hypermetabolic left upper lobe mass as well as the adjacent nodule, ipsilateral hilar and mediastinal lymphadenopathy.   MRI brain showed no intracranial mets. CT showed LUL mass with a satellite lesion as well as mediastinal adenopathy.   CT chest w contrast 08/31/2016 1. Left peripheral upper lobe mass measuring up to 3.5 cm, likely primary lung cancer. 2. 8 mm satellite nodule just anterior to the mass. 3. Aortopulmonary window lymphadenopathy, possibly metastatic.  12/22/2016 CT chest w  3.3 cm left upper lobe mass and adjacent satellite nodule are stable in size, but show new central cavitation consistent with interval response to therapy. Decreased AP window and left hilar lymphadenopathy. No new or progressive metastatic disease.  # Pathology: TPS 50%. 09/22/2016 left upper lobe biopsy showed squamous cell carcinoma. Foundation one test: ordered prior to patient first visit. Showed NO EGFR, ROS1, ALK, BRAF, MET mutation.   Today Mr.[name] presented for evaluation before starting immunotherapy for Stage III lung squamous cell. He reports feeling well, denies fever or chills.  Denies shortness of breath. Appetite  is good. He continues to work.   Review of Systems  Constitutional: Negative for appetite change, chills and fever.  HENT:   Negative for mouth sores.   Eyes: Negative for eye problems.  Respiratory: Negative for chest tightness and hemoptysis.   Cardiovascular: Negative for chest pain.  Genitourinary: Negative for difficulty urinating.   Musculoskeletal: Negative for arthralgias.  Skin: Negative for itching.  Neurological: Negative for dizziness.  Hematological: Negative for adenopathy.  Psychiatric/Behavioral: The patient is not nervous/anxious.        MEDICAL HISTORY: Past Medical History:  Diagnosis Date  . Cancer ([place])    lung ca  . Enlarged prostate     SURGICAL HISTORY: Past Surgical History:  Procedure Laterality Date  . NO PAST SURGERIES      SOCIAL HISTORY: Social History   Socioeconomic History  . Marital status: Single    Spouse name: Not on file  . Number of children: Not on file  . Years of education: Not on file  . Highest education level: Not on file  Social Needs  . Financial resource strain: Not on file  . Food insecurity - worry: Not on file  . Food insecurity - inability: Not on file  . Transportation needs - medical: Not on file  . Transportation needs - non-medical: Not on file  Occupational History  . Not on file  Tobacco Use  . Smoking status: Former Smoker    Packs/day: 1.00    Years: 15.00    Pack years: 15.00    Types: Cigarettes    Last attempt to quit: 09/28/1992    Years since quitting: 24.2  .  Smokeless tobacco: Never Used  Substance and Sexual Activity  . Alcohol use: No  . Drug use: No  . Sexual activity: Not on file  Other Topics Concern  . Not on file  Social History Narrative  . Not on file    FAMILY HISTORY Family History  Problem Relation Age of Onset  . Lymphoma Mother   . Diabetes Mother   . Lung cancer Sister   . Lung cancer Brother   . Lung cancer Brother     ALLERGIES:  has No Known  Allergies.  MEDICATIONS:  Current Outpatient Medications  Medication Sig Dispense Refill  . Cholecalciferol (VITAMIN D3) 2000 units capsule Take 1 capsule (2,000 Units total) by mouth daily.    . cyanocobalamin 500 MCG tablet Take 500 mcg by mouth daily.    Marland Kitchen dexamethasone (DECADRON) 4 MG tablet Take 2 tablets (8 mg total) by mouth daily. Start the day after chemotherapy for 2 days. 30 tablet 1  . lidocaine-prilocaine (EMLA) cream Apply to affected area once 30 g 3  . magic mouthwash w/lidocaine SOLN Take 5 mLs by mouth 4 (four) times daily. 80 ml viscous lidocaine 2%, 80 ml Mylanta, 80 ml Diphenhydramine 12.5 mg/5 ml Elixir, 80 ml Nystatin 100,000 Unit suspension, 80 ml Prednisolone 15 mg/37m, 80 ml Distilled Water. Sig: Swish/Swallow 5-10 ml four times a day as needed. Dispense 480 ml. 3RFs (Patient not taking: Reported on 12/02/2016) 480 mL 3  . mupirocin ointment (BACTROBAN) 2 % Apply 1 application topically 2 (two) times daily as needed. (Patient not taking: Reported on 12/02/2016) 22 g 2  . omeprazole (PRILOSEC) 40 MG capsule Take 1 capsule (40 mg total) by mouth daily. 30 capsule 3  . ondansetron (ZOFRAN) 8 MG tablet Take 1 tablet (8 mg total) by mouth 2 (two) times daily as needed for refractory nausea / vomiting. Start on day 3 after chemo. (Patient not taking: Reported on 11/25/2016) 30 tablet 1  . prochlorperazine (COMPAZINE) 10 MG tablet Take 1 tablet (10 mg total) by mouth every 6 (six) hours as needed (Nausea or vomiting). (Patient not taking: Reported on 11/25/2016) 30 tablet 1  . zolpidem (AMBIEN) 5 MG tablet Take 1 tablet (5 mg total) by mouth at bedtime as needed for sleep. (Patient not taking: Reported on 12/02/2016) 30 tablet 0   No current facility-administered medications for this visit.     PHYSICAL EXAMINATION:  ECOG PERFORMANCE STATUS: 0 - Asymptomatic   Vitals:   12/23/16 0845  BP: 134/74  Pulse: 89  Temp: (!) 96.6 F (35.9 C)   Filed Weights   12/23/16 0845   Weight: 250 lb 5 oz (113.5 kg)  Physical Exam  GENERAL: No distress, well nourished.  SKIN:  No rashes or significant lesions  HEAD: Normocephalic, No masses, lesions, tenderness or abnormalities  EYES: Conjunctiva are pink, non icteric ENT: External ears normal ,lips , buccal mucosa, and tongue normal and mucous membranes are moist  LYMPH: No palpable cervical and axillary lymphadenopathy  LUNGS: Clear to auscultation, no crackles or wheezes HEART: Regular rate & rhythm, no murmurs, no gallops, S1 normal and S2 normal  ABDOMEN: Abdomen soft, non-tender, normal bowel sounds, I did not appreciate any  masses or organomegaly  MUSCULOSKELETAL: No CVA tenderness and no tenderness on percussion of the back or rib cage.  EXTREMITIES: No edema, no skin discoloration or tenderness NEURO: Alert & oriented, no focal motor/sensory deficits.    LABORATORY DATA: I have personally reviewed the data as listed: CBC  Component Value Date/Time   WBC 1.6 (L) 12/02/2016 0824   RBC 4.74 12/02/2016 0824   HGB 13.2 12/02/2016 0824   HCT 40.4 12/02/2016 0824   PLT 86 (L) 12/02/2016 0824   MCV 85.1 12/02/2016 0824   MCH 27.9 12/02/2016 0824   MCHC 32.8 12/02/2016 0824   RDW 14.5 12/02/2016 0824   LYMPHSABS 0.1 (L) 12/02/2016 0824   MONOABS 0.2 12/02/2016 0824   EOSABS 0.0 12/02/2016 0824   BASOSABS 0.0 12/02/2016 0824    CMP Latest Ref Rng & Units 12/02/2016 11/25/2016 11/18/2016  Glucose 65 - 99 mg/dL 113(H) 98 94  BUN 6 - 20 mg/dL _0 Creatinine 0.61 - 1.24 mg/dL 1.10 1.23 1.17  Sodium 135 - 145 mmol/L 138 137 136  Potassium 3.5 - 5.1 mmol/L 4.1 4.3 4.5  Chloride 101 - 111 mmol/L 108 106 107  CO2 22 - 32 mmol/L _1 Calcium 8.9 - 10.3 mg/dL 8.7(L) 8.6(L) 9.0  Total Protein 6.5 - 8.1 g/dL 6.6 6.9 7.2  Total Bilirubin 0.3 - 1.2 mg/dL 0.7 0.6 1.0  Alkaline Phos 38 - 126 U/L 45 45 48  AST 15 - 41 U/L _2 ALT 17 - 63 U/L _3 ASSESSMENT/PLAN Cancer  Staging Squamous cell lung cancer, left ([facility]) Staging form: Lung, AJCC 8th Edition - Clinical stage from 09/27/2016: Stage IIIA (cT2a, cN2, cM0) - Signed by Earlie Server, MD on 09/29/2016  1. Squamous cell lung cancer, left ([place])   2. Encounter for antineoplastic immunotherapy   . # OK to proceed cycle 1 Durvalumab.  # Thrombocytopenia, likely chemotherapy and radiation-induced. Improved.  #  Per patient request, he wants his cycle 2 treatment to be scheduled on 01/13/2017  All questions were answered. The patient knows to call the clinic with any problems, questions or concerns.   Earlie Server, MD  12/22/2016 4:38 PM

## 2016-12-23 ENCOUNTER — Encounter: Payer: Self-pay | Admitting: Oncology

## 2016-12-23 ENCOUNTER — Inpatient Hospital Stay: Payer: Medicare HMO

## 2016-12-23 ENCOUNTER — Other Ambulatory Visit: Payer: Self-pay

## 2016-12-23 ENCOUNTER — Inpatient Hospital Stay (HOSPITAL_BASED_OUTPATIENT_CLINIC_OR_DEPARTMENT_OTHER): Payer: Medicare HMO | Admitting: Oncology

## 2016-12-23 ENCOUNTER — Inpatient Hospital Stay: Payer: Medicare HMO | Attending: Oncology

## 2016-12-23 VITALS — BP 134/74 | HR 89 | Temp 96.6°F | Wt 250.3 lb

## 2016-12-23 DIAGNOSIS — Z923 Personal history of irradiation: Secondary | ICD-10-CM | POA: Diagnosis not present

## 2016-12-23 DIAGNOSIS — R0982 Postnasal drip: Secondary | ICD-10-CM | POA: Insufficient documentation

## 2016-12-23 DIAGNOSIS — Z5112 Encounter for antineoplastic immunotherapy: Secondary | ICD-10-CM | POA: Insufficient documentation

## 2016-12-23 DIAGNOSIS — N400 Enlarged prostate without lower urinary tract symptoms: Secondary | ICD-10-CM

## 2016-12-23 DIAGNOSIS — Z79899 Other long term (current) drug therapy: Secondary | ICD-10-CM | POA: Diagnosis not present

## 2016-12-23 DIAGNOSIS — Z9221 Personal history of antineoplastic chemotherapy: Secondary | ICD-10-CM | POA: Insufficient documentation

## 2016-12-23 DIAGNOSIS — C3492 Malignant neoplasm of unspecified part of left bronchus or lung: Secondary | ICD-10-CM

## 2016-12-23 DIAGNOSIS — C3412 Malignant neoplasm of upper lobe, left bronchus or lung: Secondary | ICD-10-CM | POA: Diagnosis not present

## 2016-12-23 DIAGNOSIS — D696 Thrombocytopenia, unspecified: Secondary | ICD-10-CM | POA: Diagnosis not present

## 2016-12-23 DIAGNOSIS — Z87891 Personal history of nicotine dependence: Secondary | ICD-10-CM

## 2016-12-23 DIAGNOSIS — Z23 Encounter for immunization: Secondary | ICD-10-CM | POA: Insufficient documentation

## 2016-12-23 DIAGNOSIS — R599 Enlarged lymph nodes, unspecified: Secondary | ICD-10-CM | POA: Insufficient documentation

## 2016-12-23 DIAGNOSIS — Z5111 Encounter for antineoplastic chemotherapy: Secondary | ICD-10-CM

## 2016-12-23 LAB — COMPREHENSIVE METABOLIC PANEL
ALBUMIN: 3.6 g/dL (ref 3.5–5.0)
ALT: 19 U/L (ref 17–63)
AST: 23 U/L (ref 15–41)
Alkaline Phosphatase: 43 U/L (ref 38–126)
Anion gap: 6 (ref 5–15)
BUN: 17 mg/dL (ref 6–20)
CHLORIDE: 105 mmol/L (ref 101–111)
CO2: 24 mmol/L (ref 22–32)
CREATININE: 1.18 mg/dL (ref 0.61–1.24)
Calcium: 8.9 mg/dL (ref 8.9–10.3)
GFR calc Af Amer: >60 mL/min (ref >60)
GFR calc non Af Amer: >60 mL/min (ref >60)
GLUCOSE: 124 mg/dL — AB (ref 65–99)
POTASSIUM: 3.7 mmol/L (ref 3.5–5.1)
Sodium: 135 mmol/L (ref 135–145)
Total Bilirubin: 0.8 mg/dL (ref 0.3–1.2)
Total Protein: 7.3 g/dL (ref 6.5–8.1)

## 2016-12-23 LAB — CBC WITH DIFFERENTIAL/PLATELET
Basophils Absolute: 0 10*3/uL (ref 0–0.1)
Basophils Relative: 0 %
EOS ABS: 0 10*3/uL (ref 0–0.7)
EOS PCT: 1 %
HCT: 37.7 % — ABNORMAL LOW (ref 40.0–52.0)
Hemoglobin: 12.5 g/dL — ABNORMAL LOW (ref 13.0–18.0)
LYMPHS PCT: 14 %
Lymphs Abs: 0.3 10*3/uL — ABNORMAL LOW (ref 1.0–3.6)
MCH: 28 pg (ref 26.0–34.0)
MCHC: 33.1 g/dL (ref 32.0–36.0)
MCV: 84.5 fL (ref 80.0–100.0)
MONO ABS: 0.5 10*3/uL (ref 0.2–1.0)
MONOS PCT: 19 %
Neutro Abs: 1.7 10*3/uL (ref 1.4–6.5)
Neutrophils Relative %: 66 %
PLATELETS: 148 10*3/uL — AB (ref 150–440)
RBC: 4.46 MIL/uL (ref 4.40–5.90)
RDW: 16.2 % — AB (ref 11.5–14.5)
WBC: 2.5 10*3/uL — ABNORMAL LOW (ref 3.8–10.6)

## 2016-12-23 LAB — TSH: TSH: 1.37 u[IU]/mL (ref 0.350–4.500)

## 2016-12-23 MED ORDER — SODIUM CHLORIDE 0.9% FLUSH
10.0000 mL | INTRAVENOUS | Status: DC | PRN
  Administered 2016-12-23: 10 mL via INTRAVENOUS
  Filled 2016-12-23: qty 10

## 2016-12-23 MED ORDER — SODIUM CHLORIDE 0.9 % IV SOLN
Freq: Once | INTRAVENOUS | Status: AC
  Administered 2016-12-23: 10:00:00 via INTRAVENOUS
  Filled 2016-12-23: qty 1000

## 2016-12-23 MED ORDER — SODIUM CHLORIDE 0.9 % IV SOLN
1120.0000 mg | Freq: Once | INTRAVENOUS | Status: AC
  Administered 2016-12-23: 1120 mg via INTRAVENOUS
  Filled 2016-12-23: qty 20

## 2016-12-23 MED ORDER — HEPARIN SOD (PORK) LOCK FLUSH 100 UNIT/ML IV SOLN
INTRAVENOUS | Status: AC
  Filled 2016-12-23: qty 5

## 2016-12-23 MED ORDER — HEPARIN SOD (PORK) LOCK FLUSH 100 UNIT/ML IV SOLN
500.0000 [IU] | Freq: Once | INTRAVENOUS | Status: AC
  Administered 2016-12-23: 500 [IU] via INTRAVENOUS

## 2016-12-23 NOTE — Progress Notes (Signed)
Patient here today for follow up.  Patient states no new concerns today  

## 2017-01-04 ENCOUNTER — Other Ambulatory Visit: Payer: Self-pay

## 2017-01-04 ENCOUNTER — Encounter: Payer: Self-pay | Admitting: Radiation Oncology

## 2017-01-04 ENCOUNTER — Ambulatory Visit
Admission: RE | Admit: 2017-01-04 | Discharge: 2017-01-04 | Disposition: A | Discharge status: Home / Self Care | Payer: Medicare HMO | Source: Ambulatory Visit | Attending: Radiation Oncology | Admitting: Radiation Oncology

## 2017-01-04 VITALS — BP 137/78 | HR 75 | Temp 96.7°F | Wt 251.9 lb

## 2017-01-04 DIAGNOSIS — Z923 Personal history of irradiation: Secondary | ICD-10-CM | POA: Diagnosis not present

## 2017-01-04 DIAGNOSIS — Z87891 Personal history of nicotine dependence: Secondary | ICD-10-CM | POA: Diagnosis not present

## 2017-01-04 DIAGNOSIS — C3412 Malignant neoplasm of upper lobe, left bronchus or lung: Secondary | ICD-10-CM | POA: Insufficient documentation

## 2017-01-04 NOTE — Progress Notes (Signed)
Radiation Oncology Follow up Note  Name: [name]   Date:   01/04/2017 MRN:  [id number] DOB: [birth year]    This 71 y.o. male presents to the clinic today for one-month follow-up status post concurrent chemoradiation for stage IIIa (T2 N2 M0) cream cell carcinoma of the left lung                 .  REFERRING PROVIDER: Adline Potter, MD  HPI: patient is a 71 year old male now out 1 month having completed concurrent chemoradiation for stage IIIa squamous cell carcinoma the left lung. He is seen today in routine follow-up is doing well. He specifically denies cough hemoptysis or chest tightness. He's having no dysphagia. He had a recent CT scan showing the 3 cm left upper lobe mass showing central cavitation with no progression in disease. He also has decreased AP window and left hilar lymphadenopathy. No other evidence of progressive or metastatic disease is noted.  COMPLICATIONS OF TREATMENT: none  FOLLOW UP COMPLIANCE: keeps appointments   PHYSICAL EXAM:  BP 137/78   Pulse 75   Temp (!) 96.7 F (35.9 C)   Wt 251 lb 14 oz (114.3 kg)   BMI 35.13 kg/m  Well-developed well-nourished patient in NAD. HEENT reveals PERLA, EOMI, discs not visualized.  Oral cavity is clear. No oral mucosal lesions are identified. Neck is clear without evidence of cervical or supraclavicular adenopathy. Lungs are clear to A&P. Cardiac examination is essentially unremarkable with regular rate and rhythm without murmur rub or thrill. Abdomen is benign with no organomegaly or masses noted. Motor sensory and DTR levels are equal and symmetric in the upper and lower extremities. Cranial nerves II through XII are grossly intact. Proprioception is intact. No peripheral adenopathy or edema is identified. No motor or sensory levels are noted. Crude visual fields are within normal range.  RADIOLOGY RESULTS:recent CT scan is reviewed and compatible with the above-stated findings   PLAN:  present time patient is  recovering well from his concurrent chemoradiation. He is currently on Durvalumab and tolerating that well. I've asked to see him back in 4-5 months for follow-up. Patient knows to call with any concerns. I have reviewed his films with him and his family personally.  I would like to take this opportunity to thank you for allowing me to participate in the care of your patient.Armstead Peaks., MD

## 2017-01-09 ENCOUNTER — Telehealth: Payer: Self-pay | Admitting: Family Medicine

## 2017-01-09 NOTE — Telephone Encounter (Signed)
Called pt to sched for AWV with Nurse Health Advisor. c/b #  [phone] on Skype [email or web address] if you have questions

## 2017-01-11 ENCOUNTER — Ambulatory Visit: Payer: Medicare HMO

## 2017-01-11 ENCOUNTER — Other Ambulatory Visit: Payer: Medicare HMO

## 2017-01-11 ENCOUNTER — Ambulatory Visit: Payer: Medicare HMO | Admitting: Oncology

## 2017-01-11 NOTE — Progress Notes (Signed)
[place] [facility] Follow Up Visit  Patient Care Team: Adline Potter, MD as PCP - General (Family Medicine) Telford Nab, RN as Registered Nurse  REASON FOR VISIT:  Follow up for evaluation prior to maintenance immunotherapy  HISTORY OF PRESENT ILLNESS/ PERTINENT ONCOLOGY HISTORY #  [name] 71 y.o. African American male previous healthy, former smoker who presents for treatment of lung cancer. He gets his usual care at [facility].  His cancer history listed as below.  He presented last month to urgent care due to coughing and hemoptysis. X ray showed left lung mass. Cough up small amount of blood mixed in the sputum. Denies SOB, chest pain, back pain, abdominal pain. Denies headache, vision changes. He works a Administrator.  # Images:  PET 09/06/2016  showed hypermetabolic left upper lobe mass as well as the adjacent nodule, ipsilateral hilar and mediastinal lymphadenopathy.   MRI brain showed no intracranial mets. CT showed LUL mass with a satellite lesion as well as mediastinal adenopathy.   CT chest w contrast 08/31/2016 1. Left peripheral upper lobe mass measuring up to 3.5 cm, likely primary lung cancer. 2. 8 mm satellite nodule just anterior to the mass. 3. Aortopulmonary window lymphadenopathy, possibly metastatic.  12/22/2016 CT chest w  3.3 cm left upper lobe mass and adjacent satellite nodule are stable in size, but show new central cavitation consistent with interval response to therapy. Decreased AP window and left hilar lymphadenopathy. No new or progressive metastatic disease.  # Pathology: TPS 50%. 09/22/2016 left upper lobe biopsy showed squamous cell carcinoma. Foundation one test: ordered prior to patient first visit. Showed NO EGFR, ROS1, ALK, BRAF, MET mutation.   INTERVAL HISTORY Today [name] presented for evaluation before Durvalumab treatment for Stage III lung squamous cell. He reports feeling well, denies fever or chills. Denies hemoptysis,  cough or shortness of breath. Appetite is good. He reports postnatal drip which has been chronic issue for him in the winter.    Review of Systems  Constitutional: Negative for appetite change, chills, fever and unexpected weight change.  HENT:   Negative for mouth sores.   Eyes: Negative for eye problems.  Respiratory: Negative for chest tightness, cough and hemoptysis.   Cardiovascular: Negative for chest pain.  Gastrointestinal: Negative for abdominal distention and blood in stool.  Endocrine: Negative for hot flashes.  Genitourinary: Negative for difficulty urinating.   Musculoskeletal: Negative for arthralgias, back pain and gait problem.  Skin: Negative for itching.  Neurological: Negative for dizziness and gait problem.  Hematological: Negative for adenopathy. Does not bruise/bleed easily.  Psychiatric/Behavioral: The patient is not nervous/anxious.        MEDICAL HISTORY: Past Medical History:  Diagnosis Date  . Cancer ([place])    lung ca  . Enlarged prostate     SURGICAL HISTORY: Past Surgical History:  Procedure Laterality Date  . NO PAST SURGERIES    . PORTACATH PLACEMENT Right 10/05/2016   Procedure: INSERTION PORT-A-CATH;  Surgeon: [name], MD;  Location: [facility] ORS;  Service: General;  Laterality: Right;    SOCIAL HISTORY: Social History   Socioeconomic History  . Marital status: Single    Spouse name: Not on file  . Number of children: Not on file  . Years of education: Not on file  . Highest education level: Not on file  Social Needs  . Financial resource strain: Not on file  . Food insecurity - worry: Not on file  . Food insecurity - inability: Not on  file  . Transportation needs - medical: Not on file  . Transportation needs - non-medical: Not on file  Occupational History  . Not on file  Tobacco Use  . Smoking status: Former Smoker    Packs/day: 1.00    Years: 15.00    Pack years: 15.00    Types: Cigarettes    Last attempt to quit:  09/28/1992    Years since quitting: 24.3  . Smokeless tobacco: Never Used  Substance and Sexual Activity  . Alcohol use: No  . Drug use: No  . Sexual activity: Not on file  Other Topics Concern  . Not on file  Social History Narrative  . Not on file    FAMILY HISTORY Family History  Problem Relation Age of Onset  . Lymphoma Mother   . Diabetes Mother   . Lung cancer Sister   . Lung cancer Brother   . Lung cancer Brother     ALLERGIES:  has No Known Allergies.  MEDICATIONS:  Current Outpatient Medications  Medication Sig Dispense Refill  . Cholecalciferol (VITAMIN D3) 2000 units capsule Take 1 capsule (2,000 Units total) by mouth daily.    . cyanocobalamin 500 MCG tablet Take 500 mcg by mouth daily.    Marland Kitchen lidocaine-prilocaine (EMLA) cream Apply to affected area once 30 g 3  . magic mouthwash w/lidocaine SOLN Take 5 mLs by mouth 4 (four) times daily. 80 ml viscous lidocaine 2%, 80 ml Mylanta, 80 ml Diphenhydramine 12.5 mg/5 ml Elixir, 80 ml Nystatin 100,000 Unit suspension, 80 ml Prednisolone 15 mg/28m, 80 ml Distilled Water. Sig: Swish/Swallow 5-10 ml four times a day as needed. Dispense 480 ml. 3RFs 480 mL 3  . mupirocin ointment (BACTROBAN) 2 % Apply 1 application topically 2 (two) times daily as needed. 22 g 2  . omeprazole (PRILOSEC) 40 MG capsule Take 1 capsule (40 mg total) by mouth daily. 30 capsule 3  . ondansetron (ZOFRAN) 8 MG tablet Take 1 tablet (8 mg total) by mouth 2 (two) times daily as needed for refractory nausea / vomiting. Start on day 3 after chemo. 30 tablet 1  . prochlorperazine (COMPAZINE) 10 MG tablet Take 1 tablet (10 mg total) by mouth every 6 (six) hours as needed (Nausea or vomiting). 30 tablet 1  . zolpidem (AMBIEN) 5 MG tablet Take 1 tablet (5 mg total) by mouth at bedtime as needed for sleep. 30 tablet 0   No current facility-administered medications for this visit.     PHYSICAL EXAMINATION:  ECOG PERFORMANCE STATUS: 0 -  Asymptomatic   Vitals:   01/13/17 0921  BP: 129/69  Pulse: 72  Resp: 16  Temp: (!) 95.4 F (35.2 C)  SpO2: 98%   Filed Weights   01/13/17 0921  Weight: 252 lb 9 oz (114.6 kg)  Physical Exam  Constitutional: He is oriented to person, place, and time and well-developed, well-nourished, and in no distress. No distress.  HENT:  Head: Normocephalic and atraumatic.  Eyes: EOM are normal. Pupils are equal, round, and reactive to light.  Neck: Normal range of motion. Neck supple.  Cardiovascular: Normal rate and regular rhythm.  Pulmonary/Chest: Effort normal.  Abdominal: Soft. Bowel sounds are normal.  Musculoskeletal: Normal range of motion.  Neurological: He is alert and oriented to person, place, and time.  Skin: Skin is dry.  Psychiatric: Affect normal.      LABORATORY DATA: I have personally reviewed the data as listed: CBC    Component Value Date/Time   WBC 3.6 (L)  01/13/2017 0914   RBC 4.42 01/13/2017 0914   HGB 12.5 (L) 01/13/2017 0914   HCT 37.9 (L) 01/13/2017 0914   PLT 168 01/13/2017 0914   MCV 85.9 01/13/2017 0914   MCH 28.4 01/13/2017 0914   MCHC 33.0 01/13/2017 0914   RDW 18.4 (H) 01/13/2017 0914   LYMPHSABS 0.5 (L) 01/13/2017 0914   MONOABS 0.3 01/13/2017 0914   EOSABS 0.1 01/13/2017 0914   BASOSABS 0.0 01/13/2017 0914    CMP Latest Ref Rng & Units 01/13/2017 12/23/2016 12/02/2016  Glucose 65 - 99 mg/dL 134(H) 124(H) 113(H)  BUN 6 - 20 mg/dL '17 17 16  ' Creatinine 0.61 - 1.24 mg/dL 1.21 1.18 1.10  Sodium 135 - 145 mmol/L 136 135 138  Potassium 3.5 - 5.1 mmol/L 3.8 3.7 4.1  Chloride 101 - 111 mmol/L 105 105 108  CO2 22 - 32 mmol/L '24 24 24  ' Calcium 8.9 - 10.3 mg/dL 9.0 8.9 8.7(L)  Total Protein 6.5 - 8.1 g/dL 7.2 7.3 6.6  Total Bilirubin 0.3 - 1.2 mg/dL 0.9 0.8 0.7  Alkaline Phos 38 - 126 U/L 50 43 45  AST 15 - 41 U/L '23 23 19  ' ALT 17 - 63 U/L '24 19 20     ' ASSESSMENT/PLAN Cancer Staging Squamous cell lung cancer, left (HCC) Staging form:  Lung, AJCC 8th Edition - Clinical stage from 09/27/2016: Stage IIIA (cT2a, cN2, cM0) - Signed by Earlie Server, MD on 09/29/2016  1. Squamous cell lung cancer, left ([place])   2. Encounter for antineoplastic immunotherapy   . # OK to proceed cycle 2 Durvalumab.  # Thrombocytopenia, resolved.  # Postnasal drip: advice using OTC flonase 1 spray/nastril as needed.   offer flu shot today. He will get at infusion center.   All questions were answered. The patient knows to call the clinic with any problems, questions or concerns. Follow up in 2 weeks lab/MD/durvalumab   Earlie Server, MD  01/11/2017 10:28 PM

## 2017-01-13 ENCOUNTER — Other Ambulatory Visit: Payer: Self-pay

## 2017-01-13 ENCOUNTER — Inpatient Hospital Stay: Payer: Medicare HMO

## 2017-01-13 ENCOUNTER — Inpatient Hospital Stay (HOSPITAL_BASED_OUTPATIENT_CLINIC_OR_DEPARTMENT_OTHER): Payer: Medicare HMO | Admitting: Oncology

## 2017-01-13 ENCOUNTER — Encounter: Payer: Self-pay | Admitting: Oncology

## 2017-01-13 VITALS — BP 129/69 | HR 72 | Temp 95.4°F | Resp 16 | Wt 252.6 lb

## 2017-01-13 DIAGNOSIS — N400 Enlarged prostate without lower urinary tract symptoms: Secondary | ICD-10-CM

## 2017-01-13 DIAGNOSIS — Z9221 Personal history of antineoplastic chemotherapy: Secondary | ICD-10-CM | POA: Diagnosis not present

## 2017-01-13 DIAGNOSIS — Z87891 Personal history of nicotine dependence: Secondary | ICD-10-CM

## 2017-01-13 DIAGNOSIS — C3492 Malignant neoplasm of unspecified part of left bronchus or lung: Secondary | ICD-10-CM

## 2017-01-13 DIAGNOSIS — Z923 Personal history of irradiation: Secondary | ICD-10-CM

## 2017-01-13 DIAGNOSIS — R0982 Postnasal drip: Secondary | ICD-10-CM | POA: Diagnosis not present

## 2017-01-13 DIAGNOSIS — R599 Enlarged lymph nodes, unspecified: Secondary | ICD-10-CM | POA: Diagnosis not present

## 2017-01-13 DIAGNOSIS — C3412 Malignant neoplasm of upper lobe, left bronchus or lung: Secondary | ICD-10-CM

## 2017-01-13 DIAGNOSIS — Z5111 Encounter for antineoplastic chemotherapy: Secondary | ICD-10-CM

## 2017-01-13 DIAGNOSIS — D696 Thrombocytopenia, unspecified: Secondary | ICD-10-CM

## 2017-01-13 DIAGNOSIS — Z79899 Other long term (current) drug therapy: Secondary | ICD-10-CM

## 2017-01-13 DIAGNOSIS — Z23 Encounter for immunization: Secondary | ICD-10-CM

## 2017-01-13 DIAGNOSIS — Z5112 Encounter for antineoplastic immunotherapy: Secondary | ICD-10-CM

## 2017-01-13 LAB — CBC WITH DIFFERENTIAL/PLATELET
Basophils Absolute: 0 10*3/uL (ref 0–0.1)
Basophils Relative: 1 %
Eosinophils Absolute: 0.1 10*3/uL (ref 0–0.7)
Eosinophils Relative: 4 %
HEMATOCRIT: 37.9 % — AB (ref 40.0–52.0)
HEMOGLOBIN: 12.5 g/dL — AB (ref 13.0–18.0)
LYMPHS ABS: 0.5 10*3/uL — AB (ref 1.0–3.6)
Lymphocytes Relative: 15 %
MCH: 28.4 pg (ref 26.0–34.0)
MCHC: 33 g/dL (ref 32.0–36.0)
MCV: 85.9 fL (ref 80.0–100.0)
MONOS PCT: 9 %
Monocytes Absolute: 0.3 10*3/uL (ref 0.2–1.0)
NEUTROS PCT: 73 %
Neutro Abs: 2.6 10*3/uL (ref 1.4–6.5)
Platelets: 168 10*3/uL (ref 150–440)
RBC: 4.42 MIL/uL (ref 4.40–5.90)
RDW: 18.4 % — ABNORMAL HIGH (ref 11.5–14.5)
WBC: 3.6 10*3/uL — ABNORMAL LOW (ref 3.8–10.6)

## 2017-01-13 LAB — COMPREHENSIVE METABOLIC PANEL
ALBUMIN: 3.8 g/dL (ref 3.5–5.0)
ALT: 24 U/L (ref 17–63)
AST: 23 U/L (ref 15–41)
Alkaline Phosphatase: 50 U/L (ref 38–126)
Anion gap: 7 (ref 5–15)
BUN: 17 mg/dL (ref 6–20)
CHLORIDE: 105 mmol/L (ref 101–111)
CO2: 24 mmol/L (ref 22–32)
CREATININE: 1.21 mg/dL (ref 0.61–1.24)
Calcium: 9 mg/dL (ref 8.9–10.3)
GFR calc Af Amer: >60 mL/min (ref >60)
GFR calc non Af Amer: 59 mL/min — ABNORMAL LOW (ref >60)
GLUCOSE: 134 mg/dL — AB (ref 65–99)
POTASSIUM: 3.8 mmol/L (ref 3.5–5.1)
SODIUM: 136 mmol/L (ref 135–145)
Total Bilirubin: 0.9 mg/dL (ref 0.3–1.2)
Total Protein: 7.2 g/dL (ref 6.5–8.1)

## 2017-01-13 LAB — TSH: TSH: 1.443 u[IU]/mL (ref 0.350–4.500)

## 2017-01-13 MED ORDER — SODIUM CHLORIDE 0.9 % IV SOLN
Freq: Once | INTRAVENOUS | Status: AC
  Administered 2017-01-13: 10:00:00 via INTRAVENOUS
  Filled 2017-01-13: qty 1000

## 2017-01-13 MED ORDER — SODIUM CHLORIDE 0.9% FLUSH
10.0000 mL | INTRAVENOUS | Status: DC | PRN
  Administered 2017-01-13: 10 mL via INTRAVENOUS
  Filled 2017-01-13: qty 10

## 2017-01-13 MED ORDER — SODIUM CHLORIDE 0.9 % IV SOLN
10.0000 mg/kg | Freq: Once | INTRAVENOUS | Status: AC
  Administered 2017-01-13: 1120 mg via INTRAVENOUS
  Filled 2017-01-13: qty 2.4

## 2017-01-13 MED ORDER — HEPARIN SOD (PORK) LOCK FLUSH 100 UNIT/ML IV SOLN
INTRAVENOUS | Status: AC
  Filled 2017-01-13: qty 5

## 2017-01-13 MED ORDER — HEPARIN SOD (PORK) LOCK FLUSH 100 UNIT/ML IV SOLN
500.0000 [IU] | Freq: Once | INTRAVENOUS | Status: AC
  Administered 2017-01-13: 500 [IU] via INTRAVENOUS

## 2017-01-13 MED ORDER — INFLUENZA VAC SPLIT HIGH-DOSE 0.5 ML IM SUSY
0.5000 mL | PREFILLED_SYRINGE | INTRAMUSCULAR | Status: AC
Start: 2017-01-14 — End: 2017-01-13
  Administered 2017-01-13: 0.5 mL via INTRAMUSCULAR

## 2017-01-13 NOTE — Progress Notes (Signed)
Patient here today for follow up.  Patient requesting flonase for allergies

## 2017-01-20 ENCOUNTER — Ambulatory Visit: Payer: Medicare HMO | Admitting: Family Medicine

## 2017-01-23 ENCOUNTER — Ambulatory Visit: Payer: Medicare HMO

## 2017-01-23 ENCOUNTER — Ambulatory Visit: Payer: Medicare HMO | Admitting: Family Medicine

## 2017-01-24 ENCOUNTER — Ambulatory Visit: Payer: Medicare HMO | Admitting: Family Medicine

## 2017-01-24 ENCOUNTER — Encounter: Payer: Self-pay | Admitting: Family Medicine

## 2017-01-24 VITALS — BP 124/78 | HR 70 | Resp 16 | Ht 71.0 in | Wt 251.2 lb

## 2017-01-24 DIAGNOSIS — C3492 Malignant neoplasm of unspecified part of left bronchus or lung: Secondary | ICD-10-CM | POA: Diagnosis not present

## 2017-01-24 DIAGNOSIS — E785 Hyperlipidemia, unspecified: Secondary | ICD-10-CM

## 2017-01-24 DIAGNOSIS — E559 Vitamin D deficiency, unspecified: Secondary | ICD-10-CM | POA: Diagnosis not present

## 2017-01-24 DIAGNOSIS — J309 Allergic rhinitis, unspecified: Secondary | ICD-10-CM

## 2017-01-24 DIAGNOSIS — N400 Enlarged prostate without lower urinary tract symptoms: Secondary | ICD-10-CM | POA: Diagnosis not present

## 2017-01-24 DIAGNOSIS — R739 Hyperglycemia, unspecified: Secondary | ICD-10-CM | POA: Diagnosis not present

## 2017-01-24 MED ORDER — TERAZOSIN HCL 1 MG PO CAPS: 1.0000 mg | ORAL_CAPSULE | Freq: Every day | ORAL | Status: AC

## 2017-01-24 MED ORDER — FLUTICASONE PROPIONATE 50 MCG/ACT NA SUSP: 2.0000 | Freq: Every day | NASAL | 6 refills | Status: DC

## 2017-01-24 NOTE — Progress Notes (Signed)
Date:  01/24/2017   Name:  [name]   DOB:  12/28/1945   MRN:  [id number]  PCP:  Adline Potter, MD    Chief Complaint: Lung Cancer (f/u) and Benign Prostatic Hypertrophy   History of Present Illness:  This is a 71 y.o. male seen for three month f/u, s/p CTX/RT receiving immunotherapy for lung ca, tolerating well. BPH sxs worsened off Hytrin so restarted. Mild BLE edema comes and goes. Taking Tylenol prn for back pain. Wonders about cholesterol levels off fish oil and red yeast. Requests refill Flonase for AR.  Review of Systems:  Review of Systems  Constitutional: Negative for chills and fever.  Respiratory: Negative for cough and shortness of breath.   Cardiovascular: Negative for chest pain.  Genitourinary: Negative for difficulty urinating.  Neurological: Negative for syncope and light-headedness.    Patient Active Problem List   Diagnosis Date Noted  . Allergic rhinitis 01/24/2017  . Folliculitis 16/60/6301  . BPH (benign prostatic hyperplasia) 10/21/2016  . Squamous cell lung cancer, left ([place]) 09/07/2016  . B12 deficiency 09/07/2016  . Vitamin D deficiency 09/07/2016  . Obesity (BMI 30-39.9) 09/07/2016  . Hyperlipidemia 09/07/2016  . Colon polyps 09/07/2016    Prior to Admission medications   Medication Sig Start Date End Date Taking? Authorizing Provider  acetaminophen (TYLENOL) 500 MG tablet Take 1,000 mg by mouth at bedtime.   Yes [provider]  Cholecalciferol (VITAMIN D3) 2000 units capsule Take 1 capsule (2,000 Units total) by mouth daily. 09/08/16  Yes Ayonna Speranza, Gwyndolyn Saxon, MD  cyanocobalamin 500 MCG tablet Take 500 mcg by mouth daily.   Yes [provider]  lidocaine-prilocaine (EMLA) cream Apply to affected area once 10/11/16  Yes Earlie Server, MD  magic mouthwash w/lidocaine SOLN Take 5 mLs by mouth 4 (four) times daily. 80 ml viscous lidocaine 2%, 80 ml Mylanta, 80 ml Diphenhydramine 12.5 mg/5 ml Elixir, 80 ml Nystatin 100,000 Unit suspension,  80 ml Prednisolone 15 mg/71ml, 80 ml Distilled Water. Sig: Swish/Swallow 5-10 ml four times a day as needed. Dispense 480 ml. 3RFs 11/11/16  Yes Earlie Server, MD  mupirocin ointment (BACTROBAN) 2 % Apply 1 application topically 2 (two) times daily as needed. 10/21/16  Yes Laney Louderback, Gwyndolyn Saxon, MD  ondansetron (ZOFRAN) 8 MG tablet Take 1 tablet (8 mg total) by mouth 2 (two) times daily as needed for refractory nausea / vomiting. Start on day 3 after chemo. 10/11/16  Yes Earlie Server, MD  prochlorperazine (COMPAZINE) 10 MG tablet Take 1 tablet (10 mg total) by mouth every 6 (six) hours as needed (Nausea or vomiting). 10/11/16  Yes Earlie Server, MD  fluticasone [facility]) 50 MCG/ACT nasal spray Place 2 sprays into both nostrils daily. 01/24/17   Jyren Cerasoli, Gwyndolyn Saxon, MD  terazosin (HYTRIN) 1 MG capsule Take 1 capsule (1 mg total) by mouth at bedtime. 01/24/17   Adline Potter, MD    No Known Allergies  Past Surgical History:  Procedure Laterality Date  . NO PAST SURGERIES    . PORTACATH PLACEMENT Right 10/05/2016   Procedure: INSERTION PORT-A-CATH;  Surgeon: [name], MD;  Location: [facility] ORS;  Service: General;  Laterality: Right;    Social History   Tobacco Use  . Smoking status: Former Smoker    Packs/day: 1.00    Years: 15.00    Pack years: 15.00    Types: Cigarettes    Last attempt to quit: 09/28/1992    Years since quitting: 24.3  . Smokeless tobacco: Never Used  Substance Use Topics  .  Alcohol use: No  . Drug use: No    Family History  Problem Relation Age of Onset  . Lymphoma Mother   . Diabetes Mother   . Lung cancer Sister   . Lung cancer Brother   . Lung cancer Brother     Medication list has been reviewed and updated.  Physical Examination: BP 124/78   Pulse 70   Resp 16   Ht 5\' 11"  (1.803 m)   Wt 251 lb 3.2 oz (113.9 kg)   SpO2 98%   BMI 35.04 kg/m   Physical Exam  Constitutional: He appears well-developed and well-nourished.  Cardiovascular: Normal rate, regular rhythm and  normal heart sounds.  Pulmonary/Chest: Effort normal and breath sounds normal.  Musculoskeletal:  Trace BLE edema  Neurological: He is alert.  Skin: Skin is warm and dry.  Psychiatric: He has a normal mood and affect. His behavior is normal.  Nursing note and vitals reviewed.   Assessment and Plan:  1. Squamous cell lung cancer, left (HCC) On immunotherapy per oncology with improvement, tolerating well  2. Benign prostatic hyperplasia without lower urinary tract symptoms Stable back on Hytrin  3. Allergic rhinitis, unspecified seasonality, unspecified trigger Refill Flonase  4. Hyperlipidemia, unspecified hyperlipidemia type Off fish oil, red yeast - Lipid Profile  5. Vitamin D deficiency On increased supplement - Vitamin D (25 hydroxy)  6. Hyperglycemia - HgB A1c  Return in about 6 months (around 07/25/2017).  [name]. [facility]  01/24/2017

## 2017-01-25 ENCOUNTER — Encounter: Payer: Self-pay | Admitting: Family Medicine

## 2017-01-25 DIAGNOSIS — R7303 Prediabetes: Secondary | ICD-10-CM | POA: Insufficient documentation

## 2017-01-25 LAB — HEMOGLOBIN A1C
Est. average glucose Bld gHb Est-mCnc: 123 mg/dL
Hgb A1c MFr Bld: 5.9 % — ABNORMAL HIGH (ref 4.8–5.6)

## 2017-01-25 LAB — LIPID PANEL
Chol/HDL Ratio: 5.3 ratio — ABNORMAL HIGH (ref 0.0–5.0)
Cholesterol, Total: 222 mg/dL — ABNORMAL HIGH (ref 100–199)
HDL: 42 mg/dL (ref >39)
LDL Calculated: 153 mg/dL — ABNORMAL HIGH (ref 0–99)
Triglycerides: 137 mg/dL (ref 0–149)
VLDL Cholesterol Cal: 27 mg/dL (ref 5–40)

## 2017-01-25 LAB — VITAMIN D 25 HYDROXY (VIT D DEFICIENCY, FRACTURES): Vit D, 25-Hydroxy: 35.9 ng/mL (ref 30.0–100.0)

## 2017-01-26 NOTE — Progress Notes (Signed)
[place] [facility] Follow Up Visit  Patient Care Team: Adline Potter, MD as PCP - General (Family Medicine) Telford Nab, RN as Registered Nurse  REASON FOR VISIT:  Follow up for evaluation prior to maintenance immunotherapy  HISTORY OF PRESENT ILLNESS/ PERTINENT ONCOLOGY HISTORY #  [name] 71 y.o. African American male previous healthy, former smoker who presents for treatment of lung cancer. He gets his usual care at [facility].  His cancer history listed as below.  He presented last month to urgent care due to coughing and hemoptysis. X ray showed left lung mass. Cough up small amount of blood mixed in the sputum. Denies SOB, chest pain, back pain, abdominal pain. Denies headache, vision changes. He works a Administrator.  # Images:  PET 09/06/2016  showed hypermetabolic left upper lobe mass as well as the adjacent nodule, ipsilateral hilar and mediastinal lymphadenopathy.   MRI brain showed no intracranial mets. CT showed LUL mass with a satellite lesion as well as mediastinal adenopathy.   CT chest w contrast 08/31/2016 1. Left peripheral upper lobe mass measuring up to 3.5 cm, likely primary lung cancer. 2. 8 mm satellite nodule just anterior to the mass. 3. Aortopulmonary window lymphadenopathy, possibly metastatic.  12/22/2016 CT chest w  3.3 cm left upper lobe mass and adjacent satellite nodule are stable in size, but show new central cavitation consistent with interval response to therapy. Decreased AP window and left hilar lymphadenopathy. No new or progressive metastatic disease.  # Pathology: TPS 50%. 09/22/2016 left upper lobe biopsy showed squamous cell carcinoma. Foundation one test: ordered prior to patient first visit. Showed NO EGFR, ROS1, ALK, BRAF, MET mutation.     INTERVAL HISTORY Today [name] presented for evaluation prior to durvalumab maintenance treatment for stage III lung squamous cell cancer. Patient reports feeling well. Denies any fever  or chills. Denies any shortness of breath cough, diarrhea. Denies any rash. Energy level is good. He still is working as a Administrator. His wife and a daughter accompanied him to clinic visit today.   Review of Systems  Constitutional: Negative for appetite change, chills, diaphoresis, fever and unexpected weight change.  HENT:   Negative for hearing loss and mouth sores.   Eyes: Negative for eye problems and icterus.  Respiratory: Negative for chest tightness, cough and hemoptysis.   Cardiovascular: Negative for chest pain and leg swelling.  Gastrointestinal: Negative for abdominal distention and blood in stool.  Endocrine: Negative for hot flashes.  Genitourinary: Negative for difficulty urinating and dyspareunia.   Musculoskeletal: Negative for arthralgias, back pain, flank pain and gait problem.  Skin: Negative for itching.  Neurological: Negative for dizziness, gait problem and headaches.  Hematological: Negative for adenopathy. Does not bruise/bleed easily.  Psychiatric/Behavioral: Negative for confusion. The patient is not nervous/anxious.        MEDICAL HISTORY: Past Medical History:  Diagnosis Date  . Cancer ([place])    lung ca  . Enlarged prostate     SURGICAL HISTORY: Past Surgical History:  Procedure Laterality Date  . NO PAST SURGERIES    . PORTACATH PLACEMENT Right 10/05/2016   Procedure: INSERTION PORT-A-CATH;  Surgeon: [name], MD;  Location: [facility] ORS;  Service: General;  Laterality: Right;    SOCIAL HISTORY: Social History   Socioeconomic History  . Marital status: Single    Spouse name: Not on file  . Number of children: Not on file  . Years of education: Not on file  . Highest education  level: Not on file  Social Needs  . Financial resource strain: Not on file  . Food insecurity - worry: Not on file  . Food insecurity - inability: Not on file  . Transportation needs - medical: Not on file  . Transportation needs - non-medical: Not on file   Occupational History  . Not on file  Tobacco Use  . Smoking status: Former Smoker    Packs/day: 1.00    Years: 15.00    Pack years: 15.00    Types: Cigarettes    Last attempt to quit: 09/28/1992    Years since quitting: 24.3  . Smokeless tobacco: Never Used  Substance and Sexual Activity  . Alcohol use: No  . Drug use: No  . Sexual activity: Not on file  Other Topics Concern  . Not on file  Social History Narrative  . Not on file    FAMILY HISTORY Family History  Problem Relation Age of Onset  . Lymphoma Mother   . Diabetes Mother   . Lung cancer Sister   . Lung cancer Brother   . Lung cancer Brother     ALLERGIES:  has No Known Allergies.  MEDICATIONS:  Current Outpatient Medications  Medication Sig Dispense Refill  . acetaminophen (TYLENOL) 500 MG tablet Take 1,000 mg by mouth at bedtime.    . Cholecalciferol (VITAMIN D3) 2000 units capsule Take 1 capsule (2,000 Units total) by mouth daily.    . cyanocobalamin 500 MCG tablet Take 500 mcg by mouth daily.    . fluticasone (FLONASE) 50 MCG/ACT nasal spray Place 2 sprays into both nostrils daily. 16 g 6  . lidocaine-prilocaine (EMLA) cream Apply to affected area once 30 g 3  . magic mouthwash w/lidocaine SOLN Take 5 mLs by mouth 4 (four) times daily. 80 ml viscous lidocaine 2%, 80 ml Mylanta, 80 ml Diphenhydramine 12.5 mg/5 ml Elixir, 80 ml Nystatin 100,000 Unit suspension, 80 ml Prednisolone 15 mg/41m, 80 ml Distilled Water. Sig: Swish/Swallow 5-10 ml four times a day as needed. Dispense 480 ml. 3RFs 480 mL 3  . mupirocin ointment (BACTROBAN) 2 % Apply 1 application topically 2 (two) times daily as needed. 22 g 2  . ondansetron (ZOFRAN) 8 MG tablet Take 1 tablet (8 mg total) by mouth 2 (two) times daily as needed for refractory nausea / vomiting. Start on day 3 after chemo. 30 tablet 1  . prochlorperazine (COMPAZINE) 10 MG tablet Take 1 tablet (10 mg total) by mouth every 6 (six) hours as needed (Nausea or vomiting). 30  tablet 1  . terazosin (HYTRIN) 1 MG capsule Take 1 capsule (1 mg total) by mouth at bedtime.     No current facility-administered medications for this visit.     PHYSICAL EXAMINATION:  ECOG PERFORMANCE STATUS: 0 - Asymptomatic   Vitals:   01/27/17 0942  BP: 109/68  Pulse: 65  Temp: (!) 95.8 F (35.4 C)  SpO2: 97%   Filed Weights   01/27/17 0942  Weight: 252 lb 9 oz (114.6 kg)  Physical Exam  Constitutional: He is oriented to person, place, and time and well-developed, well-nourished, and in no distress. No distress.  HENT:  Head: Normocephalic and atraumatic.  Mouth/Throat: No oropharyngeal exudate.  Eyes: Conjunctivae and EOM are normal. Pupils are equal, round, and reactive to light.  Neck: Normal range of motion. Neck supple.  Cardiovascular: Normal rate and regular rhythm.  Pulmonary/Chest: Effort normal and breath sounds normal. No respiratory distress.  Abdominal: Soft. Bowel sounds are normal. He  exhibits no distension.  Musculoskeletal: Normal range of motion. He exhibits no edema.  Lymphadenopathy:    He has no cervical adenopathy.  Neurological: He is alert and oriented to person, place, and time. No cranial nerve deficit.  Skin: Skin is warm and dry.  Psychiatric: Affect and judgment normal.      LABORATORY DATA: I have personally reviewed the data as listed: CBC    Component Value Date/Time   WBC 4.1 01/27/2017 0915   RBC 4.33 (L) 01/27/2017 0915   HGB 12.5 (L) 01/27/2017 0915   HCT 37.7 (L) 01/27/2017 0915   PLT 161 01/27/2017 0915   MCV 87.1 01/27/2017 0915   MCH 28.8 01/27/2017 0915   MCHC 33.0 01/27/2017 0915   RDW 18.4 (H) 01/27/2017 0915   LYMPHSABS 0.5 (L) 01/27/2017 0915   MONOABS 0.4 01/27/2017 0915   EOSABS 0.2 01/27/2017 0915   BASOSABS 0.0 01/27/2017 0915    CMP Latest Ref Rng & Units 01/27/2017 01/13/2017 12/23/2016  Glucose 65 - 99 mg/dL 111(H) 134(H) 124(H)  BUN 6 - 20 mg/dL '18 17 17  ' Creatinine 0.61 - 1.24 mg/dL 1.01 1.21 1.18   Sodium 135 - 145 mmol/L 137 136 135  Potassium 3.5 - 5.1 mmol/L 3.8 3.8 3.7  Chloride 101 - 111 mmol/L 106 105 105  CO2 22 - 32 mmol/L '24 24 24  ' Calcium 8.9 - 10.3 mg/dL 9.2 9.0 8.9  Total Protein 6.5 - 8.1 g/dL 7.5 7.2 7.3  Total Bilirubin 0.3 - 1.2 mg/dL 1.1 0.9 0.8  Alkaline Phos 38 - 126 U/L 53 50 43  AST 15 - 41 U/L '21 23 23  ' ALT 17 - 63 U/L '19 24 19     ' ASSESSMENT/PLAN Cancer Staging Squamous cell lung cancer, left (HCC) Staging form: Lung, AJCC 8th Edition - Clinical stage from 09/27/2016: Stage IIIA (cT2a, cN2, cM0) - Signed by Earlie Server, MD on 09/29/2016  1. Squamous cell lung cancer, left ([place])   2. Encounter for antineoplastic chemotherapy   3. Encounter for antineoplastic immunotherapy   . # Will proceed with cycle 3 Durvalumab. Patient appears tolerated well without complications.  plan repeat CT scan after 6 cycles of Druvalumab.   All questions were answered. The patient knows to call the clinic with any problems, questions or concerns. Follow up in 2 weeks lab/MD/durvalumab. He will be seeing covering physician as I am off that day. Patient is aware.    Earlie Server, MD  01/26/2017 11:25 PM

## 2017-01-27 ENCOUNTER — Inpatient Hospital Stay: Payer: Medicare HMO

## 2017-01-27 ENCOUNTER — Inpatient Hospital Stay (HOSPITAL_BASED_OUTPATIENT_CLINIC_OR_DEPARTMENT_OTHER): Payer: Medicare HMO | Admitting: Oncology

## 2017-01-27 ENCOUNTER — Encounter: Payer: Self-pay | Admitting: Oncology

## 2017-01-27 ENCOUNTER — Other Ambulatory Visit: Payer: Self-pay

## 2017-01-27 ENCOUNTER — Inpatient Hospital Stay: Payer: Medicare HMO | Attending: Oncology

## 2017-01-27 VITALS — BP 109/68 | HR 65 | Temp 95.8°F | Wt 252.6 lb

## 2017-01-27 DIAGNOSIS — Z5112 Encounter for antineoplastic immunotherapy: Secondary | ICD-10-CM

## 2017-01-27 DIAGNOSIS — N400 Enlarged prostate without lower urinary tract symptoms: Secondary | ICD-10-CM

## 2017-01-27 DIAGNOSIS — Z801 Family history of malignant neoplasm of trachea, bronchus and lung: Secondary | ICD-10-CM | POA: Insufficient documentation

## 2017-01-27 DIAGNOSIS — C3412 Malignant neoplasm of upper lobe, left bronchus or lung: Secondary | ICD-10-CM | POA: Insufficient documentation

## 2017-01-27 DIAGNOSIS — Z79899 Other long term (current) drug therapy: Secondary | ICD-10-CM

## 2017-01-27 DIAGNOSIS — Z87891 Personal history of nicotine dependence: Secondary | ICD-10-CM | POA: Insufficient documentation

## 2017-01-27 DIAGNOSIS — C3492 Malignant neoplasm of unspecified part of left bronchus or lung: Secondary | ICD-10-CM

## 2017-01-27 DIAGNOSIS — Z5111 Encounter for antineoplastic chemotherapy: Secondary | ICD-10-CM

## 2017-01-27 DIAGNOSIS — C778 Secondary and unspecified malignant neoplasm of lymph nodes of multiple regions: Secondary | ICD-10-CM | POA: Diagnosis not present

## 2017-01-27 DIAGNOSIS — D696 Thrombocytopenia, unspecified: Secondary | ICD-10-CM

## 2017-01-27 LAB — TSH: TSH: 1.532 u[IU]/mL (ref 0.350–4.500)

## 2017-01-27 LAB — COMPREHENSIVE METABOLIC PANEL
ALT: 19 U/L (ref 17–63)
AST: 21 U/L (ref 15–41)
Albumin: 4 g/dL (ref 3.5–5.0)
Alkaline Phosphatase: 53 U/L (ref 38–126)
Anion gap: 7 (ref 5–15)
BILIRUBIN TOTAL: 1.1 mg/dL (ref 0.3–1.2)
BUN: 18 mg/dL (ref 6–20)
CHLORIDE: 106 mmol/L (ref 101–111)
CO2: 24 mmol/L (ref 22–32)
CREATININE: 1.01 mg/dL (ref 0.61–1.24)
Calcium: 9.2 mg/dL (ref 8.9–10.3)
Glucose, Bld: 111 mg/dL — ABNORMAL HIGH (ref 65–99)
POTASSIUM: 3.8 mmol/L (ref 3.5–5.1)
Sodium: 137 mmol/L (ref 135–145)
TOTAL PROTEIN: 7.5 g/dL (ref 6.5–8.1)

## 2017-01-27 LAB — CBC WITH DIFFERENTIAL/PLATELET
BASOS ABS: 0 10*3/uL (ref 0–0.1)
Basophils Relative: 1 %
EOS ABS: 0.2 10*3/uL (ref 0–0.7)
EOS PCT: 5 %
HCT: 37.7 % — ABNORMAL LOW (ref 40.0–52.0)
HEMOGLOBIN: 12.5 g/dL — AB (ref 13.0–18.0)
LYMPHS ABS: 0.5 10*3/uL — AB (ref 1.0–3.6)
LYMPHS PCT: 13 %
MCH: 28.8 pg (ref 26.0–34.0)
MCHC: 33 g/dL (ref 32.0–36.0)
MCV: 87.1 fL (ref 80.0–100.0)
Monocytes Absolute: 0.4 10*3/uL (ref 0.2–1.0)
Monocytes Relative: 10 %
NEUTROS PCT: 71 %
Neutro Abs: 2.9 10*3/uL (ref 1.4–6.5)
PLATELETS: 161 10*3/uL (ref 150–440)
RBC: 4.33 MIL/uL — AB (ref 4.40–5.90)
RDW: 18.4 % — ABNORMAL HIGH (ref 11.5–14.5)
WBC: 4.1 10*3/uL (ref 3.8–10.6)

## 2017-01-27 MED ORDER — SODIUM CHLORIDE 0.9% FLUSH
10.0000 mL | INTRAVENOUS | Status: DC | PRN
  Administered 2017-01-27: 10 mL via INTRAVENOUS
  Filled 2017-01-27: qty 10

## 2017-01-27 MED ORDER — SODIUM CHLORIDE 0.9 % IV SOLN
10.0000 mg/kg | Freq: Once | INTRAVENOUS | Status: AC
  Administered 2017-01-27: 1120 mg via INTRAVENOUS
  Filled 2017-01-27: qty 20

## 2017-01-27 MED ORDER — HEPARIN SOD (PORK) LOCK FLUSH 100 UNIT/ML IV SOLN
500.0000 [IU] | Freq: Once | INTRAVENOUS | Status: AC | PRN
Start: 2017-01-27 — End: 2017-01-27
  Administered 2017-01-27: 500 [IU]

## 2017-01-27 MED ORDER — SODIUM CHLORIDE 0.9 % IV SOLN
Freq: Once | INTRAVENOUS | Status: AC
  Administered 2017-01-27: 10:00:00 via INTRAVENOUS
  Filled 2017-01-27: qty 1000

## 2017-01-27 MED ORDER — HEPARIN SOD (PORK) LOCK FLUSH 100 UNIT/ML IV SOLN: 500.0000 [IU] | Freq: Once | INTRAVENOUS | Status: DC

## 2017-01-27 NOTE — Progress Notes (Signed)
Patient here today for follow up.  Patient states no new concerns today  

## 2017-02-10 ENCOUNTER — Inpatient Hospital Stay: Payer: Medicare HMO

## 2017-02-10 ENCOUNTER — Inpatient Hospital Stay (HOSPITAL_BASED_OUTPATIENT_CLINIC_OR_DEPARTMENT_OTHER): Payer: Medicare HMO | Admitting: Hematology and Oncology

## 2017-02-10 VITALS — BP 128/75 | HR 71 | Resp 18

## 2017-02-10 VITALS — BP 143/82 | HR 72 | Temp 96.7°F | Resp 20 | Wt 253.1 lb

## 2017-02-10 DIAGNOSIS — C778 Secondary and unspecified malignant neoplasm of lymph nodes of multiple regions: Secondary | ICD-10-CM | POA: Diagnosis not present

## 2017-02-10 DIAGNOSIS — Z79899 Other long term (current) drug therapy: Secondary | ICD-10-CM | POA: Diagnosis not present

## 2017-02-10 DIAGNOSIS — D696 Thrombocytopenia, unspecified: Secondary | ICD-10-CM

## 2017-02-10 DIAGNOSIS — C3412 Malignant neoplasm of upper lobe, left bronchus or lung: Secondary | ICD-10-CM | POA: Diagnosis not present

## 2017-02-10 DIAGNOSIS — N400 Enlarged prostate without lower urinary tract symptoms: Secondary | ICD-10-CM

## 2017-02-10 DIAGNOSIS — Z87891 Personal history of nicotine dependence: Secondary | ICD-10-CM | POA: Diagnosis not present

## 2017-02-10 DIAGNOSIS — C3492 Malignant neoplasm of unspecified part of left bronchus or lung: Secondary | ICD-10-CM

## 2017-02-10 DIAGNOSIS — Z5111 Encounter for antineoplastic chemotherapy: Secondary | ICD-10-CM

## 2017-02-10 DIAGNOSIS — Z801 Family history of malignant neoplasm of trachea, bronchus and lung: Secondary | ICD-10-CM

## 2017-02-10 DIAGNOSIS — Z5112 Encounter for antineoplastic immunotherapy: Secondary | ICD-10-CM | POA: Insufficient documentation

## 2017-02-10 LAB — CBC WITH DIFFERENTIAL/PLATELET
BASOS PCT: 0 %
Basophils Absolute: 0 10*3/uL (ref 0–0.1)
EOS PCT: 5 %
Eosinophils Absolute: 0.2 10*3/uL (ref 0–0.7)
HEMATOCRIT: 38.2 % — AB (ref 40.0–52.0)
Hemoglobin: 12.5 g/dL — ABNORMAL LOW (ref 13.0–18.0)
Lymphocytes Relative: 12 %
Lymphs Abs: 0.5 10*3/uL — ABNORMAL LOW (ref 1.0–3.6)
MCH: 28.8 pg (ref 26.0–34.0)
MCHC: 32.7 g/dL (ref 32.0–36.0)
MCV: 88.1 fL (ref 80.0–100.0)
MONO ABS: 0.4 10*3/uL (ref 0.2–1.0)
MONOS PCT: 10 %
NEUTROS ABS: 2.9 10*3/uL (ref 1.4–6.5)
Neutrophils Relative %: 73 %
PLATELETS: 170 10*3/uL (ref 150–440)
RBC: 4.34 MIL/uL — ABNORMAL LOW (ref 4.40–5.90)
RDW: 17.4 % — AB (ref 11.5–14.5)
WBC: 4.1 10*3/uL (ref 3.8–10.6)

## 2017-02-10 LAB — COMPREHENSIVE METABOLIC PANEL
ALBUMIN: 3.7 g/dL (ref 3.5–5.0)
ALK PHOS: 57 U/L (ref 38–126)
ALT: 17 U/L (ref 17–63)
AST: 21 U/L (ref 15–41)
Anion gap: 6 (ref 5–15)
BILIRUBIN TOTAL: 1 mg/dL (ref 0.3–1.2)
BUN: 18 mg/dL (ref 6–20)
CALCIUM: 9 mg/dL (ref 8.9–10.3)
CO2: 24 mmol/L (ref 22–32)
CREATININE: 1.01 mg/dL (ref 0.61–1.24)
Chloride: 106 mmol/L (ref 101–111)
GFR calc Af Amer: >60 mL/min (ref >60)
GFR calc non Af Amer: >60 mL/min (ref >60)
GLUCOSE: 108 mg/dL — AB (ref 65–99)
Potassium: 4.1 mmol/L (ref 3.5–5.1)
SODIUM: 136 mmol/L (ref 135–145)
TOTAL PROTEIN: 7.3 g/dL (ref 6.5–8.1)

## 2017-02-10 LAB — TSH: TSH: 1.125 u[IU]/mL (ref 0.350–4.500)

## 2017-02-10 MED ORDER — SODIUM CHLORIDE 0.9% FLUSH
10.0000 mL | Freq: Once | INTRAVENOUS | Status: AC
  Administered 2017-02-10: 10 mL via INTRAVENOUS
  Filled 2017-02-10: qty 10

## 2017-02-10 MED ORDER — HEPARIN SOD (PORK) LOCK FLUSH 100 UNIT/ML IV SOLN
500.0000 [IU] | Freq: Once | INTRAVENOUS | Status: AC
  Administered 2017-02-10: 500 [IU] via INTRAVENOUS
  Filled 2017-02-10: qty 5

## 2017-02-10 MED ORDER — DURVALUMAB 500 MG/10ML IV SOLN
10.0000 mg/kg | Freq: Once | INTRAVENOUS | Status: AC
  Administered 2017-02-10: 1120 mg via INTRAVENOUS
  Filled 2017-02-10: qty 2.4

## 2017-02-10 MED ORDER — SODIUM CHLORIDE 0.9 % IV SOLN
Freq: Once | INTRAVENOUS | Status: AC
  Administered 2017-02-10: 10:00:00 via INTRAVENOUS
  Filled 2017-02-10: qty 1000

## 2017-02-10 NOTE — Progress Notes (Signed)
Patient offers no complaints today. 

## 2017-02-10 NOTE — Progress Notes (Signed)
[place] [facility] Follow Up Visit  Patient Care Team: Adline Potter, MD as PCP - General (Family Medicine) Telford Nab, RN as Registered Nurse  REASON FOR VISIT:  Follow up for evaluation prior to maintenance immunotherapy  HISTORY OF PRESENT ILLNESS/ PERTINENT ONCOLOGY HISTORY #  [name] 71 y.o. African American male previous healthy, former smoker who presents for treatment of lung cancer. He gets his usual care at [facility].  His cancer history listed as below.  He presented last month to urgent care due to coughing and hemoptysis. X ray showed left lung mass. Cough up small amount of blood mixed in the sputum. Denies SOB, chest pain, back pain, abdominal pain. Denies headache, vision changes. He works a Administrator.  # Images:  PET 09/06/2016  showed hypermetabolic left upper lobe mass as well as the adjacent nodule, ipsilateral hilar and mediastinal lymphadenopathy.   MRI brain showed no intracranial mets. CT showed LUL mass with a satellite lesion as well as mediastinal adenopathy.   CT chest w contrast 08/31/2016 1. Left peripheral upper lobe mass measuring up to 3.5 cm, likely primary lung cancer.  2. 8 mm satellite nodule just anterior to the mass. 3. Aortopulmonary window lymphadenopathy, possibly metastatic.  12/22/2016 CT chest w  3.3 cm left upper lobe mass and adjacent satellite nodule are stable in size, but show new central cavitation consistent with interval response to therapy. Decreased AP window and left hilar lymphadenopathy. No new or progressive metastatic disease.  # Pathology: TPS 50%. 09/22/2016 left upper lobe biopsy showed squamous cell carcinoma. Foundation one test: ordered prior to patient first visit. Showed NO EGFR, ROS1, ALK, BRAF, MET mutation.    INTERVAL HISTORY  Patient last seen by Dr Tasia Catchings on 01/27/2017.  At that time, he felt well.  He denied any complaint.  He received cycle #3 durvalumab.  Today, [name] presented for  evaluation prior to durvalumab maintenance treatment for stage III lung squamous cell cancer. Patient reports that he is feeling well overall. He states "I'm good".  He denies any complaints.  He denies any fever or chills. There has not been any shortness of breath cough, diarrhea. Denies any rash. Energy level is good. He works as a Administrator. His wife, [name], accompanied him to clinic visit today.  Review of Systems  Constitutional: Negative for appetite change, chills, diaphoresis, fever and unexpected weight change.  HENT:   Negative for hearing loss, mouth sores, sore throat and trouble swallowing.   Eyes: Negative for eye problems and icterus.  Respiratory: Negative for chest tightness, cough, hemoptysis and shortness of breath.   Cardiovascular: Negative for chest pain and leg swelling.  Gastrointestinal: Negative for abdominal distention, blood in stool, constipation, diarrhea, nausea and vomiting.  Endocrine: Negative for hot flashes.  Genitourinary: Negative for difficulty urinating and dyspareunia.   Musculoskeletal: Negative for arthralgias, back pain, flank pain and gait problem.  Skin: Negative for itching and rash.  Neurological: Negative for dizziness, extremity weakness, gait problem, headaches and numbness.  Hematological: Negative for adenopathy. Does not bruise/bleed easily.  Psychiatric/Behavioral: Negative for confusion. The patient is not nervous/anxious.      MEDICAL HISTORY: Past Medical History:  Diagnosis Date  . Cancer ([place])    lung ca  . Enlarged prostate     SURGICAL HISTORY: Past Surgical History:  Procedure Laterality Date  . NO PAST SURGERIES    . PORTACATH PLACEMENT Right 10/05/2016   Procedure: INSERTION PORT-A-CATH;  Surgeon: [name],  MD;  Location: [facility] ORS;  Service: General;  Laterality: Right;    SOCIAL HISTORY: Social History   Socioeconomic History  . Marital status: Single    Spouse name: Not on file  . Number of  children: Not on file  . Years of education: Not on file  . Highest education level: Not on file  Social Needs  . Financial resource strain: Not on file  . Food insecurity - worry: Not on file  . Food insecurity - inability: Not on file  . Transportation needs - medical: Not on file  . Transportation needs - non-medical: Not on file  Occupational History  . Not on file  Tobacco Use  . Smoking status: Former Smoker    Packs/day: 1.00    Years: 15.00    Pack years: 15.00    Types: Cigarettes    Last attempt to quit: 09/28/1992    Years since quitting: 24.3  . Smokeless tobacco: Never Used  Substance and Sexual Activity  . Alcohol use: No  . Drug use: No  . Sexual activity: Not on file  Other Topics Concern  . Not on file  Social History Narrative  . Not on file    FAMILY HISTORY Family History  Problem Relation Age of Onset  . Lymphoma Mother   . Diabetes Mother   . Lung cancer Sister   . Lung cancer Brother   . Lung cancer Brother     ALLERGIES:  has No Known Allergies.  MEDICATIONS:  Current Outpatient Medications  Medication Sig Dispense Refill  . acetaminophen (TYLENOL) 500 MG tablet Take 1,000 mg by mouth at bedtime.    . Cholecalciferol (VITAMIN D3) 2000 units capsule Take 1 capsule (2,000 Units total) by mouth daily.    . cyanocobalamin 500 MCG tablet Take 500 mcg by mouth daily.    . fluticasone (FLONASE) 50 MCG/ACT nasal spray Place 2 sprays into both nostrils daily. 16 g 6  . lidocaine-prilocaine (EMLA) cream Apply to affected area once 30 g 3  . magic mouthwash w/lidocaine SOLN Take 5 mLs by mouth 4 (four) times daily. 80 ml viscous lidocaine 2%, 80 ml Mylanta, 80 ml Diphenhydramine 12.5 mg/5 ml Elixir, 80 ml Nystatin 100,000 Unit suspension, 80 ml Prednisolone 15 mg/38m, 80 ml Distilled Water. Sig: Swish/Swallow 5-10 ml four times a day as needed. Dispense 480 ml. 3RFs 480 mL 3  . mupirocin ointment (BACTROBAN) 2 % Apply 1 application topically 2 (two)  times daily as needed. 22 g 2  . ondansetron (ZOFRAN) 8 MG tablet Take 1 tablet (8 mg total) by mouth 2 (two) times daily as needed for refractory nausea / vomiting. Start on day 3 after chemo. 30 tablet 1  . prochlorperazine (COMPAZINE) 10 MG tablet Take 1 tablet (10 mg total) by mouth every 6 (six) hours as needed (Nausea or vomiting). 30 tablet 1  . terazosin (HYTRIN) 1 MG capsule Take 1 capsule (1 mg total) by mouth at bedtime.     No current facility-administered medications for this visit.    Facility-Administered Medications Ordered in Other Visits  Medication Dose Route Frequency Provider Last Rate Last Dose  . heparin lock flush 100 unit/mL  500 Units Intravenous Once YEarlie Server MD        PHYSICAL EXAMINATION:  ECOG PERFORMANCE STATUS: 0 - Asymptomatic   Vitals:   02/10/17 0911  BP: (!) 143/82  Pulse: 72  Resp: 20  Temp: (!) 96.7 F (35.9 C)  SpO2: 93%   Filed  Weights   02/10/17 0911  Weight: 253 lb 1 oz (114.8 kg)  Physical Exam  Constitutional: He is oriented to person, place, and time and well-developed, well-nourished, and in no distress. No distress.  HENT:  Head: Normocephalic and atraumatic.  Mouth/Throat: No oropharyngeal exudate.  Eyes: Conjunctivae and EOM are normal. Pupils are equal, round, and reactive to light.  Neck: Normal range of motion. Neck supple.  Cardiovascular: Normal rate and regular rhythm.  Pulmonary/Chest: Effort normal and breath sounds normal. No respiratory distress.  Abdominal: Soft. Bowel sounds are normal. He exhibits no distension.  Musculoskeletal: Normal range of motion. He exhibits no edema.  Neurological: He is alert and oriented to person, place, and time. No cranial nerve deficit.  Skin: Skin is warm and dry.  Psychiatric: Affect and judgment normal.     LABORATORY DATA: I have personally reviewed the data as listed: CBC    Component Value Date/Time   WBC 4.1 02/10/2017 0851   RBC 4.34 (L) 02/10/2017 0851   HGB 12.5  (L) 02/10/2017 0851   HCT 38.2 (L) 02/10/2017 0851   PLT 170 02/10/2017 0851   MCV 88.1 02/10/2017 0851   MCH 28.8 02/10/2017 0851   MCHC 32.7 02/10/2017 0851   RDW 17.4 (H) 02/10/2017 0851   LYMPHSABS 0.5 (L) 02/10/2017 0851   MONOABS 0.4 02/10/2017 0851   EOSABS 0.2 02/10/2017 0851   BASOSABS 0.0 02/10/2017 0851    CMP Latest Ref Rng & Units 02/10/2017 01/27/2017 01/13/2017  Glucose 65 - 99 mg/dL 108(H) 111(H) 134(H)  BUN 6 - 20 mg/dL '18 18 17  ' Creatinine 0.61 - 1.24 mg/dL 1.01 1.01 1.21  Sodium 135 - 145 mmol/L 136 137 136  Potassium 3.5 - 5.1 mmol/L 4.1 3.8 3.8  Chloride 101 - 111 mmol/L 106 106 105  CO2 22 - 32 mmol/L '24 24 24  ' Calcium 8.9 - 10.3 mg/dL 9.0 9.2 9.0  Total Protein 6.5 - 8.1 g/dL 7.3 7.5 7.2  Total Bilirubin 0.3 - 1.2 mg/dL 1.0 1.1 0.9  Alkaline Phos 38 - 126 U/L 57 53 50  AST 15 - 41 U/L '21 21 23  ' ALT 17 - 63 U/L '17 19 24     ' ASSESSMENT/PLAN Cancer Staging Squamous cell lung cancer, left (HCC) Staging form: Lung, AJCC 8th Edition - Clinical stage from 09/27/2016: Stage IIIA (cT2a, cN2, cM0) - Signed by Earlie Server, MD on 09/29/2016  1. Squamous cell lung cancer, left ([place])   2. Encounter for antineoplastic immunotherapy   . # Will proceed with cycle #4 Durvalumab. Patient is tolerating well without complications.  plan repeat CT scan after 6 cycles of Druvalumab.   All questions were answered. The patient knows to call the clinic with any problems, questions or concerns. Follow up in 2 weeks lab/MD (Dr Yu)/Durvalumab.      Honor Loh, NP  02/10/2017 9:57 AM   I saw and evaluated the patient, participating in the key portions of the service and reviewing pertinent diagnostic studies and records.  I reviewed the nurse practitioner's note and agree with the findings and the plan.  The assessment and plan were discussed with the patient.  A few multiple questions were asked by the patient and answered.   Nolon Stalls, MD 02/10/2017,9:57 AM

## 2017-02-11 ENCOUNTER — Encounter: Payer: Self-pay | Admitting: Hematology and Oncology

## 2017-02-23 NOTE — Progress Notes (Addendum)
[place] Cancer Follow Up Visit  Patient Care Team: Adline Potter, MD as PCP - General (Family Medicine) Telford Nab, RN as Registered Nurse  REASON FOR VISIT:  Follow up for evaluation prior to maintenance immunotherapy  HISTORY OF PRESENT ILLNESS/ PERTINENT ONCOLOGY HISTORY #  [name] 72 y.o. African American male previous healthy, former smoker who presents for treatment of lung cancer. He gets his usual care at [facility].  His cancer history listed as below.  He presented last month to urgent care due to coughing and hemoptysis. X ray showed left lung mass. Cough up small amount of blood mixed in the sputum. Denies SOB, chest pain, back pain, abdominal pain. Denies headache, vision changes. He works a Administrator.  # Images:  PET 09/06/2016  showed hypermetabolic left upper lobe mass as well as the adjacent nodule, ipsilateral hilar and mediastinal lymphadenopathy.   MRI brain showed no intracranial mets. CT showed LUL mass with a satellite lesion as well as mediastinal adenopathy.   CT chest w contrast 08/31/2016 1. Left peripheral upper lobe mass measuring up to 3.5 cm, likely primary lung cancer. 2. 8 mm satellite nodule just anterior to the mass. 3. Aortopulmonary window lymphadenopathy, possibly metastatic.  12/22/2016 CT chest w  3.3 cm left upper lobe mass and adjacent satellite nodule are stable in size, but show new central cavitation consistent with interval response to therapy. Decreased AP window and left hilar lymphadenopathy. No new or progressive metastatic disease.  # Pathology: TPS 50%. 09/22/2016 left upper lobe biopsy showed squamous cell carcinoma. Foundation one test: ordered prior to patient first visit. Showed NO EGFR, ROS1, ALK, BRAF, MET mutation.     INTERVAL HISTORY Today [name] presented for evaluation prior to durvalumab maintenance treatment for stage III lung squamous cell cancer. Patient reports feeling well. He has good  appetite and good energy level. Denies any shortness of breath, diarrhea. Denies any rash. He still working as a Administrator. His wife and daughter accompany him to the clinic visit today. Patient has had a mild dry cough, usually only before going to bed. He usually does not wake up to cough.     Review of Systems  Constitutional: Negative for appetite change, chills, fever and unexpected weight change.  HENT:   Negative for hearing loss and mouth sores.   Eyes: Negative for eye problems and icterus.  Respiratory: Positive for cough. Negative for chest tightness and hemoptysis.        Dry cough   Cardiovascular: Negative for chest pain and leg swelling.  Gastrointestinal: Negative for abdominal distention and blood in stool.  Endocrine: Negative for hot flashes.  Genitourinary: Negative for difficulty urinating, dyspareunia and dysuria.   Musculoskeletal: Negative for arthralgias, back pain, flank pain, gait problem and myalgias.  Skin: Negative for itching and rash.  Neurological: Negative for dizziness, gait problem, headaches and light-headedness.  Hematological: Negative for adenopathy. Does not bruise/bleed easily.  Psychiatric/Behavioral: Negative for confusion and decreased concentration. The patient is not nervous/anxious.        MEDICAL HISTORY: Past Medical History:  Diagnosis Date  . Cancer ([place])    lung ca  . Enlarged prostate     SURGICAL HISTORY: Past Surgical History:  Procedure Laterality Date  . NO PAST SURGERIES    . PORTACATH PLACEMENT Right 10/05/2016   Procedure: INSERTION PORT-A-CATH;  Surgeon: [name], MD;  Location: [facility] ORS;  Service: General;  Laterality: Right;    SOCIAL HISTORY: Social  History   Socioeconomic History  . Marital status: Single    Spouse name: Not on file  . Number of children: Not on file  . Years of education: Not on file  . Highest education level: Not on file  Social Needs  . Financial resource strain: Not on  file  . Food insecurity - worry: Not on file  . Food insecurity - inability: Not on file  . Transportation needs - medical: Not on file  . Transportation needs - non-medical: Not on file  Occupational History  . Not on file  Tobacco Use  . Smoking status: Former Smoker    Packs/day: 1.00    Years: 15.00    Pack years: 15.00    Types: Cigarettes    Last attempt to quit: 09/28/1992    Years since quitting: 24.4  . Smokeless tobacco: Never Used  Substance and Sexual Activity  . Alcohol use: No  . Drug use: No  . Sexual activity: Not on file  Other Topics Concern  . Not on file  Social History Narrative  . Not on file    FAMILY HISTORY Family History  Problem Relation Age of Onset  . Lymphoma Mother   . Diabetes Mother   . Lung cancer Sister   . Lung cancer Brother   . Lung cancer Brother     ALLERGIES:  has No Known Allergies.  MEDICATIONS:  Current Outpatient Medications  Medication Sig Dispense Refill  . acetaminophen (TYLENOL) 500 MG tablet Take 1,000 mg by mouth at bedtime.    . Cholecalciferol (VITAMIN D3) 2000 units capsule Take 1 capsule (2,000 Units total) by mouth daily.    . cyanocobalamin 500 MCG tablet Take 500 mcg by mouth daily.    . fluticasone (FLONASE) 50 MCG/ACT nasal spray Place 2 sprays into both nostrils daily. 16 g 6  . lidocaine-prilocaine (EMLA) cream Apply to affected area once 30 g 3  . magic mouthwash w/lidocaine SOLN Take 5 mLs by mouth 4 (four) times daily. 80 ml viscous lidocaine 2%, 80 ml Mylanta, 80 ml Diphenhydramine 12.5 mg/5 ml Elixir, 80 ml Nystatin 100,000 Unit suspension, 80 ml Prednisolone 15 mg/71m, 80 ml Distilled Water. Sig: Swish/Swallow 5-10 ml four times a day as needed. Dispense 480 ml. 3RFs 480 mL 3  . mupirocin ointment (BACTROBAN) 2 % Apply 1 application topically 2 (two) times daily as needed. 22 g 2  . ondansetron (ZOFRAN) 8 MG tablet Take 1 tablet (8 mg total) by mouth 2 (two) times daily as needed for refractory nausea  / vomiting. Start on day 3 after chemo. 30 tablet 1  . prochlorperazine (COMPAZINE) 10 MG tablet Take 1 tablet (10 mg total) by mouth every 6 (six) hours as needed (Nausea or vomiting). 30 tablet 1  . terazosin (HYTRIN) 1 MG capsule Take 1 capsule (1 mg total) by mouth at bedtime.     No current facility-administered medications for this visit.     PHYSICAL EXAMINATION:  ECOG PERFORMANCE STATUS: 0 - Asymptomatic   Vitals:   02/24/17 0921  BP: 127/78  Pulse: 73  Temp: (!) 96 F (35.6 C)  SpO2: 98%   Filed Weights   02/24/17 0921  Weight: 251 lb (113.9 kg)  Physical Exam  Constitutional: He is oriented to person, place, and time and well-developed, well-nourished, and in no distress. No distress.  HENT:  Head: Normocephalic and atraumatic.  Mouth/Throat: No oropharyngeal exudate.  Eyes: Conjunctivae and EOM are normal. Pupils are equal, round, and reactive  to light. Left eye exhibits no discharge. No scleral icterus.  Neck: Normal range of motion. Neck supple. No JVD present.  Cardiovascular: Normal rate, regular rhythm and normal heart sounds.  No murmur heard. Pulmonary/Chest: Effort normal and breath sounds normal. No respiratory distress. He has no wheezes. He has no rales. He exhibits no tenderness.  Abdominal: Soft. Bowel sounds are normal. He exhibits no distension. There is no tenderness.  Musculoskeletal: Normal range of motion. He exhibits no edema.  Lymphadenopathy:    He has no cervical adenopathy.  Neurological: He is alert and oriented to person, place, and time. No cranial nerve deficit.  Skin: Skin is warm and dry. He is not diaphoretic. No erythema.  Psychiatric: Affect and judgment normal.      LABORATORY DATA: I have personally reviewed the data as listed: CBC    Component Value Date/Time   WBC 4.1 02/10/2017 0851   RBC 4.34 (L) 02/10/2017 0851   HGB 12.5 (L) 02/10/2017 0851   HCT 38.2 (L) 02/10/2017 0851   PLT 170 02/10/2017 0851   MCV 88.1  02/10/2017 0851   MCH 28.8 02/10/2017 0851   MCHC 32.7 02/10/2017 0851   RDW 17.4 (H) 02/10/2017 0851   LYMPHSABS 0.5 (L) 02/10/2017 0851   MONOABS 0.4 02/10/2017 0851   EOSABS 0.2 02/10/2017 0851   BASOSABS 0.0 02/10/2017 0851    CMP Latest Ref Rng & Units 02/10/2017 01/27/2017 01/13/2017  Glucose 65 - 99 mg/dL 108(H) 111(H) 134(H)  BUN 6 - 20 mg/dL '18 18 17  ' Creatinine 0.61 - 1.24 mg/dL 1.01 1.01 1.21  Sodium 135 - 145 mmol/L 136 137 136  Potassium 3.5 - 5.1 mmol/L 4.1 3.8 3.8  Chloride 101 - 111 mmol/L 106 106 105  CO2 22 - 32 mmol/L '24 24 24  ' Calcium 8.9 - 10.3 mg/dL 9.0 9.2 9.0  Total Protein 6.5 - 8.1 g/dL 7.3 7.5 7.2  Total Bilirubin 0.3 - 1.2 mg/dL 1.0 1.1 0.9  Alkaline Phos 38 - 126 U/L 57 53 50  AST 15 - 41 U/L '21 21 23  ' ALT 17 - 63 U/L '17 19 24     ' ASSESSMENT/PLAN Cancer Staging Squamous cell lung cancer, left (HCC) Staging form: Lung, AJCC 8th Edition - Clinical stage from 09/27/2016: Stage IIIA (cT2a, cN2, cM0) - Signed by Earlie Server, MD on 09/29/2016  1. Squamous cell lung cancer, left ([place])   2. Encounter for antineoplastic immunotherapy   3. Dry cough   . # Will proceed with cycle 5 durvalumab. Patient appears tolerating well without any complications. I will repeat a CAT scan after 6 cycles of durvalumab. # Dry cough, we'll obtain a chest x-ray today.  CXR today showed Interval development of diffuse bilateral peribronchial thickening which may represent findings of acute bronchitis/bronchopneumonia, superimposed on pre-existing emphysematous changes. Will start him on a course of Z Pak and prednisone 5m daily, follow up in 1 week. Repeat CXR as well.   Right internal jugular approach central venous catheter is seen coiling on itself in the right superior thorax. Will ask surgeon evaluate if his port need to be adjusted.  All questions were answered. The patient knows to call the clinic with any problems, questions or concerns.  ZEarlie Server MD,  PhD Hematology Oncology [facility]at A[facility] Jr. Va Medical CenterPager- [phone]/12/2017

## 2017-02-24 ENCOUNTER — Encounter: Payer: Self-pay | Admitting: Oncology

## 2017-02-24 ENCOUNTER — Inpatient Hospital Stay (HOSPITAL_BASED_OUTPATIENT_CLINIC_OR_DEPARTMENT_OTHER): Payer: Medicare HMO | Admitting: Oncology

## 2017-02-24 ENCOUNTER — Other Ambulatory Visit: Payer: Self-pay

## 2017-02-24 ENCOUNTER — Ambulatory Visit
Admission: RE | Admit: 2017-02-24 | Discharge: 2017-02-24 | Disposition: A | Discharge status: Home / Self Care | Payer: Medicare HMO | Source: Ambulatory Visit | Attending: Oncology | Admitting: Oncology

## 2017-02-24 ENCOUNTER — Inpatient Hospital Stay: Payer: Medicare HMO

## 2017-02-24 ENCOUNTER — Inpatient Hospital Stay: Payer: Medicare HMO | Attending: Oncology

## 2017-02-24 VITALS — BP 127/78 | HR 73 | Temp 96.0°F | Wt 251.0 lb

## 2017-02-24 DIAGNOSIS — Z5112 Encounter for antineoplastic immunotherapy: Secondary | ICD-10-CM

## 2017-02-24 DIAGNOSIS — R058 Other specified cough: Secondary | ICD-10-CM

## 2017-02-24 DIAGNOSIS — Z87891 Personal history of nicotine dependence: Secondary | ICD-10-CM | POA: Diagnosis not present

## 2017-02-24 DIAGNOSIS — Z801 Family history of malignant neoplasm of trachea, bronchus and lung: Secondary | ICD-10-CM | POA: Insufficient documentation

## 2017-02-24 DIAGNOSIS — R05 Cough: Secondary | ICD-10-CM | POA: Insufficient documentation

## 2017-02-24 DIAGNOSIS — C3492 Malignant neoplasm of unspecified part of left bronchus or lung: Secondary | ICD-10-CM | POA: Diagnosis present

## 2017-02-24 DIAGNOSIS — J700 Acute pulmonary manifestations due to radiation: Secondary | ICD-10-CM | POA: Insufficient documentation

## 2017-02-24 DIAGNOSIS — R918 Other nonspecific abnormal finding of lung field: Secondary | ICD-10-CM | POA: Diagnosis not present

## 2017-02-24 DIAGNOSIS — R0982 Postnasal drip: Secondary | ICD-10-CM | POA: Diagnosis not present

## 2017-02-24 DIAGNOSIS — C3412 Malignant neoplasm of upper lobe, left bronchus or lung: Secondary | ICD-10-CM | POA: Diagnosis present

## 2017-02-24 LAB — CBC WITH DIFFERENTIAL/PLATELET
Basophils Absolute: 0 10*3/uL (ref 0–0.1)
Basophils Relative: 0 %
Eosinophils Absolute: 0.2 10*3/uL (ref 0–0.7)
Eosinophils Relative: 3 %
HCT: 38.8 % — ABNORMAL LOW (ref 40.0–52.0)
Hemoglobin: 12.7 g/dL — ABNORMAL LOW (ref 13.0–18.0)
Lymphocytes Relative: 12 %
Lymphs Abs: 0.6 10*3/uL — ABNORMAL LOW (ref 1.0–3.6)
MCH: 29.6 pg (ref 26.0–34.0)
MCHC: 32.9 g/dL (ref 32.0–36.0)
MCV: 90.2 fL (ref 80.0–100.0)
Monocytes Absolute: 0.4 10*3/uL (ref 0.2–1.0)
Monocytes Relative: 9 %
Neutro Abs: 3.7 10*3/uL (ref 1.4–6.5)
Neutrophils Relative %: 76 %
Platelets: 179 10*3/uL (ref 150–440)
RBC: 4.3 MIL/uL — ABNORMAL LOW (ref 4.40–5.90)
RDW: 15.5 % — ABNORMAL HIGH (ref 11.5–14.5)
WBC: 4.9 10*3/uL (ref 3.8–10.6)

## 2017-02-24 LAB — COMPREHENSIVE METABOLIC PANEL
ALT: 18 U/L (ref 17–63)
AST: 19 U/L (ref 15–41)
Albumin: 4 g/dL (ref 3.5–5.0)
Alkaline Phosphatase: 64 U/L (ref 38–126)
Anion gap: 7 (ref 5–15)
BUN: 17 mg/dL (ref 6–20)
CO2: 25 mmol/L (ref 22–32)
Calcium: 9.1 mg/dL (ref 8.9–10.3)
Chloride: 105 mmol/L (ref 101–111)
Creatinine, Ser: 1.02 mg/dL (ref 0.61–1.24)
GFR calc Af Amer: >60 mL/min (ref >60)
GFR calc non Af Amer: >60 mL/min (ref >60)
Glucose, Bld: 108 mg/dL — ABNORMAL HIGH (ref 65–99)
Potassium: 3.8 mmol/L (ref 3.5–5.1)
Sodium: 137 mmol/L (ref 135–145)
Total Bilirubin: 1 mg/dL (ref 0.3–1.2)
Total Protein: 7.7 g/dL (ref 6.5–8.1)

## 2017-02-24 LAB — TSH: TSH: 2.005 u[IU]/mL (ref 0.350–4.500)

## 2017-02-24 MED ORDER — HEPARIN SOD (PORK) LOCK FLUSH 100 UNIT/ML IV SOLN
500.0000 [IU] | Freq: Once | INTRAVENOUS | Status: AC
  Administered 2017-02-24: 500 [IU] via INTRAVENOUS
  Filled 2017-02-24: qty 5

## 2017-02-24 MED ORDER — SODIUM CHLORIDE 0.9% FLUSH
10.0000 mL | INTRAVENOUS | Status: DC | PRN
  Filled 2017-02-24: qty 10

## 2017-02-24 MED ORDER — DURVALUMAB 500 MG/10ML IV SOLN
10.0000 mg/kg | Freq: Once | INTRAVENOUS | Status: AC
  Administered 2017-02-24: 1120 mg via INTRAVENOUS
  Filled 2017-02-24: qty 20

## 2017-02-24 MED ORDER — SODIUM CHLORIDE 0.9 % IV SOLN
Freq: Once | INTRAVENOUS | Status: AC
  Administered 2017-02-24: 10:00:00 via INTRAVENOUS
  Filled 2017-02-24: qty 1000

## 2017-02-24 MED ORDER — AZITHROMYCIN 250 MG PO TABS: ORAL_TABLET | ORAL | 0 refills | Status: DC

## 2017-02-24 MED ORDER — HEPARIN SOD (PORK) LOCK FLUSH 100 UNIT/ML IV SOLN: 500.0000 [IU] | Freq: Once | INTRAVENOUS | Status: DC | PRN

## 2017-02-24 MED ORDER — SODIUM CHLORIDE 0.9% FLUSH
10.0000 mL | INTRAVENOUS | Status: DC | PRN
  Administered 2017-02-24 (×2): 10 mL via INTRAVENOUS
  Filled 2017-02-24: qty 10

## 2017-02-24 MED ORDER — PREDNISONE 50 MG PO TABS: 50.0000 mg | ORAL_TABLET | Freq: Every day | ORAL | 0 refills | Status: DC

## 2017-02-24 NOTE — Addendum Note (Signed)
Addended by: Earlie Server on: 02/24/2017 03:44 PM   Modules accepted: Orders

## 2017-02-24 NOTE — Progress Notes (Signed)
Patient here today for follow up.  Patient c/o congestion that started about a month ago.

## 2017-02-24 NOTE — Addendum Note (Signed)
Addended by: Earlie Server on: 02/24/2017 03:57 PM   Modules accepted: Orders

## 2017-03-02 NOTE — Progress Notes (Signed)
[facility] Follow Up Visit  Patient Care Team: Adline Potter, MD as PCP - General (Family Medicine) Telford Nab, RN as Registered Nurse  REASON FOR VISIT:  Follow up for evaluation prior to maintenance immunotherapy  HISTORY OF PRESENT ILLNESS/ PERTINENT ONCOLOGY HISTORY #  [name] 72 y.o. African American male previous healthy, former smoker who presents for treatment of lung cancer. He gets his usual care at [facility].  His cancer history listed as below.  He presented last month to urgent care due to coughing and hemoptysis. X ray showed left lung mass. Cough up small amount of blood mixed in the sputum. Denies SOB, chest pain, back pain, abdominal pain. Denies headache, vision changes. He works a Administrator.  # Images:  PET 09/06/2016  showed hypermetabolic left upper lobe mass as well as the adjacent nodule, ipsilateral hilar and mediastinal lymphadenopathy.   MRI brain showed no intracranial mets. CT showed LUL mass with a satellite lesion as well as mediastinal adenopathy.   CT chest w contrast 08/31/2016 1. Left peripheral upper lobe mass measuring up to 3.5 cm, likely primary lung cancer. 2. 8 mm satellite nodule just anterior to the mass. 3. Aortopulmonary window lymphadenopathy, possibly metastatic.  12/22/2016 CT chest w  3.3 cm left upper lobe mass and adjacent satellite nodule are stable in size, but show new central cavitation consistent with interval response to therapy. Decreased AP window and left hilar lymphadenopathy. No new or progressive metastatic disease.  # Pathology: TPS 50%. 09/22/2016 left upper lobe biopsy showed squamous cell carcinoma. Foundation one test: ordered prior to patient first visit. Showed NO EGFR, ROS1, ALK, BRAF, MET mutation.     INTERVAL HISTORY Today Mr. Ayoub Arey presented for follow up .  Patient reports feeling well. Cough has improved, not completely resolved.   Review of Systems  Constitutional: Negative  for appetite change, chills, fever and unexpected weight change.  HENT:   Negative for hearing loss, mouth sores and sore throat.   Eyes: Negative for eye problems and icterus.  Respiratory: Positive for cough. Negative for chest tightness and hemoptysis.        Dry cough   Cardiovascular: Negative for chest pain and leg swelling.  Gastrointestinal: Negative for abdominal distention, blood in stool and constipation.  Endocrine: Negative for hot flashes.  Genitourinary: Negative for difficulty urinating, dyspareunia, dysuria and hematuria.   Musculoskeletal: Negative for back pain and myalgias.  Skin: Negative for itching and rash.  Neurological: Negative for dizziness and light-headedness.  Hematological: Negative for adenopathy. Does not bruise/bleed easily.  Psychiatric/Behavioral: Negative for confusion and depression. The patient is not nervous/anxious.        MEDICAL HISTORY: Past Medical History:  Diagnosis Date  . Cancer ([place])    lung ca  . Enlarged prostate     SURGICAL HISTORY: Past Surgical History:  Procedure Laterality Date  . NO PAST SURGERIES    . PORTACATH PLACEMENT Right 10/05/2016   Procedure: INSERTION PORT-A-CATH;  Surgeon: [name], MD;  Location: [facility] ORS;  Service: General;  Laterality: Right;    SOCIAL HISTORY: Social History   Socioeconomic History  . Marital status: Single    Spouse name: Not on file  . Number of children: Not on file  . Years of education: Not on file  . Highest education level: Not on file  Social Needs  . Financial resource strain: Not on file  . Food insecurity - worry: Not on file  . Food insecurity -  inability: Not on file  . Transportation needs - medical: Not on file  . Transportation needs - non-medical: Not on file  Occupational History  . Not on file  Tobacco Use  . Smoking status: Former Smoker    Packs/day: 1.00    Years: 15.00    Pack years: 15.00    Types: Cigarettes    Last attempt to quit:  09/28/1992    Years since quitting: 24.4  . Smokeless tobacco: Never Used  Substance and Sexual Activity  . Alcohol use: No  . Drug use: No  . Sexual activity: Not on file  Other Topics Concern  . Not on file  Social History Narrative  . Not on file    FAMILY HISTORY Family History  Problem Relation Age of Onset  . Lymphoma Mother   . Diabetes Mother   . Lung cancer Sister   . Lung cancer Brother   . Lung cancer Brother     ALLERGIES:  has No Known Allergies.  MEDICATIONS:  Current Outpatient Medications  Medication Sig Dispense Refill  . acetaminophen (TYLENOL) 500 MG tablet Take 1,000 mg by mouth at bedtime.    Marland Kitchen azithromycin (ZITHROMAX) 250 MG tablet 550m on day 1 and 2580mdaily x 4 days. 6 each 0  . Cholecalciferol (VITAMIN D3) 2000 units capsule Take 1 capsule (2,000 Units total) by mouth daily.    . cyanocobalamin 500 MCG tablet Take 500 mcg by mouth daily.    . fluticasone (FLONASE) 50 MCG/ACT nasal spray Place 2 sprays into both nostrils daily. 16 g 6  . lidocaine-prilocaine (EMLA) cream Apply to affected area once 30 g 3  . magic mouthwash w/lidocaine SOLN Take 5 mLs by mouth 4 (four) times daily. 80 ml viscous lidocaine 2%, 80 ml Mylanta, 80 ml Diphenhydramine 12.5 mg/5 ml Elixir, 80 ml Nystatin 100,000 Unit suspension, 80 ml Prednisolone 15 mg/61m60m80 ml Distilled Water. Sig: Swish/Swallow 5-10 ml four times a day as needed. Dispense 480 ml. 3RFs 480 mL 3  . mupirocin ointment (BACTROBAN) 2 % Apply 1 application topically 2 (two) times daily as needed. 22 g 2  . ondansetron (ZOFRAN) 8 MG tablet Take 1 tablet (8 mg total) by mouth 2 (two) times daily as needed for refractory nausea / vomiting. Start on day 3 after chemo. 30 tablet 1  . predniSONE (DELTASONE) 50 MG tablet Take 1 tablet (50 mg total) by mouth daily. 7 tablet 0  . prochlorperazine (COMPAZINE) 10 MG tablet Take 1 tablet (10 mg total) by mouth every 6 (six) hours as needed (Nausea or vomiting). 30 tablet  1  . terazosin (HYTRIN) 1 MG capsule Take 1 capsule (1 mg total) by mouth at bedtime.     No current facility-administered medications for this visit.     PHYSICAL EXAMINATION:  ECOG PERFORMANCE STATUS: 0 - Asymptomatic   Vitals:   03/03/17 0901 03/03/17 0905  BP:  (!) 145/85  Pulse:  66  Resp: 16   Temp:  97.9 F (36.6 C)   Filed Weights   03/03/17 0901  Weight: 257 lb 12.8 oz (116.9 kg)  Physical Exam  Constitutional: He is oriented to person, place, and time and well-developed, well-nourished, and in no distress. No distress.  HENT:  Head: Normocephalic and atraumatic.  Mouth/Throat: No oropharyngeal exudate.  Eyes: Conjunctivae and EOM are normal. Pupils are equal, round, and reactive to light. Left eye exhibits no discharge. No scleral icterus.  Neck: Normal range of motion. Neck supple.  Cardiovascular:  Normal rate, regular rhythm and normal heart sounds.  No murmur heard. Pulmonary/Chest: Effort normal and breath sounds normal. He has no rales.  Abdominal: Soft. Bowel sounds are normal. He exhibits no distension. There is no tenderness. There is no guarding.  Musculoskeletal: Normal range of motion. He exhibits no edema.  Lymphadenopathy:    He has no cervical adenopathy.  Neurological: He is alert and oriented to person, place, and time. No cranial nerve deficit.  Skin: Skin is warm and dry. No rash noted. He is not diaphoretic.  Psychiatric: Affect and judgment normal.      LABORATORY DATA: I have personally reviewed the data as listed: CBC    Component Value Date/Time   WBC 4.9 02/24/2017 0903   RBC 4.30 (L) 02/24/2017 0903   HGB 12.7 (L) 02/24/2017 0903   HCT 38.8 (L) 02/24/2017 0903   PLT 179 02/24/2017 0903   MCV 90.2 02/24/2017 0903   MCH 29.6 02/24/2017 0903   MCHC 32.9 02/24/2017 0903   RDW 15.5 (H) 02/24/2017 0903   LYMPHSABS 0.6 (L) 02/24/2017 0903   MONOABS 0.4 02/24/2017 0903   EOSABS 0.2 02/24/2017 0903   BASOSABS 0.0 02/24/2017 0903     CMP Latest Ref Rng & Units 02/24/2017 02/10/2017 01/27/2017  Glucose 65 - 99 mg/dL 108(H) 108(H) 111(H)  BUN 6 - 20 mg/dL '17 18 18  ' Creatinine 0.61 - 1.24 mg/dL 1.02 1.01 1.01  Sodium 135 - 145 mmol/L 137 136 137  Potassium 3.5 - 5.1 mmol/L 3.8 4.1 3.8  Chloride 101 - 111 mmol/L 105 106 106  CO2 22 - 32 mmol/L '25 24 24  ' Calcium 8.9 - 10.3 mg/dL 9.1 9.0 9.2  Total Protein 6.5 - 8.1 g/dL 7.7 7.3 7.5  Total Bilirubin 0.3 - 1.2 mg/dL 1.0 1.0 1.1  Alkaline Phos 38 - 126 U/L 64 57 53  AST 15 - 41 U/L '19 21 21  ' ALT 17 - 63 U/L '18 17 19     ' ASSESSMENT/PLAN Cancer Staging Squamous cell lung cancer, left (HCC) Staging form: Lung, AJCC 8th Edition - Clinical stage from 09/27/2016: Stage IIIA (cT2a, cN2, cM0) - Signed by Earlie Server, MD on 09/29/2016  1. Squamous cell lung cancer, left ([place])   2. Dry cough   3. Pneumonitis   . # CXR showed left upper lobe consolidation worsened, possible radiation pneumonitis vs immunotherapy pneumonitis, likely former given the focal appearance.  Hold Durvalumab.  Continue prednisone 33m daily.  Will obtain CT chest for additional evaluation.   Right internal jugular approach central venous catheter is seen coiling on itself in the right superior thorax, discussed with surgeon, no concerns of tip location.   All questions were answered. The patient knows to call the clinic with any problems, questions or concerns.  ZEarlie Server MD, PhD Hematology Oncology [facility] CenterPager- [phone]/18/2019

## 2017-03-03 ENCOUNTER — Ambulatory Visit
Admission: RE | Admit: 2017-03-03 | Discharge: 2017-03-03 | Disposition: A | Discharge status: Home / Self Care | Payer: Medicare HMO | Source: Ambulatory Visit | Attending: Oncology | Admitting: Oncology

## 2017-03-03 ENCOUNTER — Other Ambulatory Visit: Payer: Self-pay

## 2017-03-03 ENCOUNTER — Encounter: Payer: Self-pay | Admitting: Oncology

## 2017-03-03 ENCOUNTER — Inpatient Hospital Stay (HOSPITAL_BASED_OUTPATIENT_CLINIC_OR_DEPARTMENT_OTHER): Payer: Medicare HMO | Admitting: Oncology

## 2017-03-03 VITALS — BP 145/85 | HR 66 | Temp 97.9°F | Resp 16 | Ht 71.0 in | Wt 257.8 lb

## 2017-03-03 DIAGNOSIS — R05 Cough: Secondary | ICD-10-CM

## 2017-03-03 DIAGNOSIS — R918 Other nonspecific abnormal finding of lung field: Secondary | ICD-10-CM | POA: Insufficient documentation

## 2017-03-03 DIAGNOSIS — C3412 Malignant neoplasm of upper lobe, left bronchus or lung: Secondary | ICD-10-CM | POA: Diagnosis not present

## 2017-03-03 DIAGNOSIS — R058 Other specified cough: Secondary | ICD-10-CM

## 2017-03-03 DIAGNOSIS — J189 Pneumonia, unspecified organism: Secondary | ICD-10-CM | POA: Diagnosis not present

## 2017-03-03 DIAGNOSIS — J984 Other disorders of lung: Secondary | ICD-10-CM

## 2017-03-03 DIAGNOSIS — Z5112 Encounter for antineoplastic immunotherapy: Secondary | ICD-10-CM | POA: Diagnosis not present

## 2017-03-03 DIAGNOSIS — C3492 Malignant neoplasm of unspecified part of left bronchus or lung: Secondary | ICD-10-CM

## 2017-03-03 MED ORDER — PREDNISONE 50 MG PO TABS: 50.0000 mg | ORAL_TABLET | Freq: Every day | ORAL | 0 refills | Status: DC

## 2017-03-03 NOTE — Progress Notes (Signed)
See Follow up note.

## 2017-03-08 ENCOUNTER — Ambulatory Visit
Admission: RE | Admit: 2017-03-08 | Discharge: 2017-03-08 | Disposition: A | Discharge status: Home / Self Care | Payer: Medicare HMO | Source: Ambulatory Visit | Attending: Oncology | Admitting: Oncology

## 2017-03-08 DIAGNOSIS — R05 Cough: Secondary | ICD-10-CM | POA: Diagnosis not present

## 2017-03-08 DIAGNOSIS — J432 Centrilobular emphysema: Secondary | ICD-10-CM | POA: Insufficient documentation

## 2017-03-08 DIAGNOSIS — R058 Other specified cough: Secondary | ICD-10-CM

## 2017-03-08 DIAGNOSIS — I2510 Atherosclerotic heart disease of native coronary artery without angina pectoris: Secondary | ICD-10-CM | POA: Diagnosis not present

## 2017-03-08 DIAGNOSIS — I700 Atherosclerosis of aorta: Secondary | ICD-10-CM | POA: Insufficient documentation

## 2017-03-08 DIAGNOSIS — J189 Pneumonia, unspecified organism: Secondary | ICD-10-CM

## 2017-03-08 DIAGNOSIS — C3492 Malignant neoplasm of unspecified part of left bronchus or lung: Secondary | ICD-10-CM | POA: Diagnosis not present

## 2017-03-08 MED ORDER — IOPAMIDOL (ISOVUE-300) INJECTION 61%
75.0000 mL | Freq: Once | INTRAVENOUS | Status: AC | PRN
  Administered 2017-03-08: 75 mL via INTRAVENOUS

## 2017-03-09 NOTE — Progress Notes (Addendum)
[facility] Follow Up Visit  Patient Care Team: Adline Potter, MD as PCP - General (Family Medicine) Telford Nab, RN as Registered Nurse  REASON FOR VISIT:  Follow up for evaluation prior to maintenance immunotherapy  HISTORY OF PRESENT ILLNESS/ PERTINENT ONCOLOGY HISTORY #  [name] 72 y.o. African American male previous healthy, former smoker who presents for treatment of lung cancer. He gets his usual care at [facility].  His cancer history listed as below.  He presented last month to urgent care due to coughing and hemoptysis. X ray showed left lung mass. Cough up small amount of blood mixed in the sputum. Denies SOB, chest pain, back pain, abdominal pain. Denies headache, vision changes. He works a Administrator.  # Images:  PET 09/06/2016  showed hypermetabolic left upper lobe mass as well as the adjacent nodule, ipsilateral hilar and mediastinal lymphadenopathy.   MRI brain showed no intracranial mets. CT showed LUL mass with a satellite lesion as well as mediastinal adenopathy.   CT chest w contrast 08/31/2016 1. Left peripheral upper lobe mass measuring up to 3.5 cm, likely primary lung cancer. 2. 8 mm satellite nodule just anterior to the mass. 3. Aortopulmonary window lymphadenopathy, possibly metastatic.  12/22/2016 CT chest w  3.3 cm left upper lobe mass and adjacent satellite nodule are stable in size, but show new central cavitation consistent with interval response to therapy. Decreased AP window and left hilar lymphadenopathy. No new or progressive metastatic disease.  # Pathology: TPS 50%. 09/22/2016 left upper lobe biopsy showed squamous cell carcinoma. Foundation one test: ordered prior to patient first visit. Showed NO EGFR, ROS1, ALK, BRAF, MET mutation.   Cancer Treatment: S/p concurrent carbo/taxol and Radiation.  Durvalumab Q2 weeks. started 01/27/2017, held after 2 cycles due to radiation pneumonitis.  INTERVAL HISTORY Today [name]  presented for follow up .  He takes prednisone 50 mg daily and reports that cough has significantly improved, although still having intermittent coughs.  Denies any shortness of breath. His appetite is good and he also started exercising.  Reports postnasal drip which has been a chronic problem for him.  Review of Systems  Constitutional: Negative for appetite change, chills, fatigue and fever.  HENT:   Negative for hearing loss and lump/mass.        Postnasal drip  Eyes: Negative for eye problems and icterus.  Respiratory: Positive for cough. Negative for chest tightness and hemoptysis.        Dry cough   Cardiovascular: Negative for chest pain and leg swelling.  Gastrointestinal: Negative for abdominal distention and blood in stool.  Endocrine: Negative for hot flashes.  Genitourinary: Negative for difficulty urinating and dysuria.   Musculoskeletal: Negative for back pain and myalgias.  Skin: Negative for itching.  Neurological: Negative for dizziness and light-headedness.  Hematological: Negative for adenopathy.  Psychiatric/Behavioral: Negative for confusion. The patient is not nervous/anxious.        MEDICAL HISTORY: Past Medical History:  Diagnosis Date  . Cancer ([place])    lung ca  . Enlarged prostate     SURGICAL HISTORY: Past Surgical History:  Procedure Laterality Date  . NO PAST SURGERIES    . PORTACATH PLACEMENT Right 10/05/2016   Procedure: INSERTION PORT-A-CATH;  Surgeon: [name], MD;  Location: [facility] ORS;  Service: General;  Laterality: Right;    SOCIAL HISTORY: Social History   Socioeconomic History  . Marital status: Single    Spouse name: Not on file  .  Number of children: Not on file  . Years of education: Not on file  . Highest education level: Not on file  Social Needs  . Financial resource strain: Not on file  . Food insecurity - worry: Not on file  . Food insecurity - inability: Not on file  . Transportation needs - medical: Not on  file  . Transportation needs - non-medical: Not on file  Occupational History  . Not on file  Tobacco Use  . Smoking status: Former Smoker    Packs/day: 1.00    Years: 15.00    Pack years: 15.00    Types: Cigarettes    Last attempt to quit: 09/28/1992    Years since quitting: 24.4  . Smokeless tobacco: Never Used  Substance and Sexual Activity  . Alcohol use: No  . Drug use: No  . Sexual activity: Not on file  Other Topics Concern  . Not on file  Social History Narrative  . Not on file    FAMILY HISTORY Family History  Problem Relation Age of Onset  . Lymphoma Mother   . Diabetes Mother   . Lung cancer Sister   . Lung cancer Brother   . Lung cancer Brother     ALLERGIES:  has No Known Allergies.  MEDICATIONS:  Current Outpatient Medications  Medication Sig Dispense Refill  . acetaminophen (TYLENOL) 500 MG tablet Take 1,000 mg by mouth at bedtime.    . Cholecalciferol (VITAMIN D3) 2000 units capsule Take 1 capsule (2,000 Units total) by mouth daily.    . cyanocobalamin 500 MCG tablet Take 500 mcg by mouth daily.    . fluticasone (FLONASE) 50 MCG/ACT nasal spray Place 2 sprays into both nostrils daily. 16 g 6  . lidocaine-prilocaine (EMLA) cream Apply to affected area once 30 g 3  . mupirocin ointment (BACTROBAN) 2 % Apply 1 application topically 2 (two) times daily as needed. 22 g 2  . ondansetron (ZOFRAN) 8 MG tablet Take 1 tablet (8 mg total) by mouth 2 (two) times daily as needed for refractory nausea / vomiting. Start on day 3 after chemo. 30 tablet 1  . prochlorperazine (COMPAZINE) 10 MG tablet Take 1 tablet (10 mg total) by mouth every 6 (six) hours as needed (Nausea or vomiting). 30 tablet 1  . terazosin (HYTRIN) 1 MG capsule Take 1 capsule (1 mg total) by mouth at bedtime.    . predniSONE (DELTASONE) 10 MG tablet Take 4 tablets (40 mg total) by mouth daily with breakfast. 120 tablet 0   No current facility-administered medications for this visit.     PHYSICAL  EXAMINATION:  ECOG PERFORMANCE STATUS: 0 - Asymptomatic   Vitals:   03/10/17 0857  BP: 126/75  Pulse: 70  Temp: (!) 96.4 F (35.8 C)  SpO2: 97%   Filed Weights   03/10/17 0857  Weight: 254 lb (115.2 kg)  Physical Exam  Constitutional: He is oriented to person, place, and time and well-developed, well-nourished, and in no distress.  HENT:  Head: Normocephalic and atraumatic.  Mouth/Throat: No oropharyngeal exudate.  Eyes: Conjunctivae and EOM are normal. Pupils are equal, round, and reactive to light. No scleral icterus.  Neck: Normal range of motion. Neck supple.  Cardiovascular: Normal rate, regular rhythm and normal heart sounds.  No murmur heard. Pulmonary/Chest: Effort normal and breath sounds normal. No respiratory distress. He has no wheezes. He has no rales.  Abdominal: Soft. Bowel sounds are normal. He exhibits no distension.  Musculoskeletal: Normal range of motion. He  exhibits no edema.  Lymphadenopathy:    He has no cervical adenopathy.  Neurological: He is alert and oriented to person, place, and time. No cranial nerve deficit.  Skin: Skin is warm and dry. No rash noted. He is not diaphoretic.  Psychiatric: Affect and judgment normal.      LABORATORY DATA: I have personally reviewed the data as listed: CBC    Component Value Date/Time   WBC 4.9 02/24/2017 0903   RBC 4.30 (L) 02/24/2017 0903   HGB 12.7 (L) 02/24/2017 0903   HCT 38.8 (L) 02/24/2017 0903   PLT 179 02/24/2017 0903   MCV 90.2 02/24/2017 0903   MCH 29.6 02/24/2017 0903   MCHC 32.9 02/24/2017 0903   RDW 15.5 (H) 02/24/2017 0903   LYMPHSABS 0.6 (L) 02/24/2017 0903   MONOABS 0.4 02/24/2017 0903   EOSABS 0.2 02/24/2017 0903   BASOSABS 0.0 02/24/2017 0903    CMP Latest Ref Rng & Units 02/24/2017 02/10/2017 01/27/2017  Glucose 65 - 99 mg/dL 108(H) 108(H) 111(H)  BUN 6 - 20 mg/dL _0 Creatinine 0.61 - 1.24 mg/dL 1.02 1.01 1.01  Sodium 135 - 145 mmol/L 137 136 137  Potassium 3.5 - 5.1  mmol/L 3.8 4.1 3.8  Chloride 101 - 111 mmol/L 105 106 106  CO2 22 - 32 mmol/L _1 Calcium 8.9 - 10.3 mg/dL 9.1 9.0 9.2  Total Protein 6.5 - 8.1 g/dL 7.7 7.3 7.5  Total Bilirubin 0.3 - 1.2 mg/dL 1.0 1.0 1.1  Alkaline Phos 38 - 126 U/L 64 57 53  AST 15 - 41 U/L _2 ALT 17 - 63 U/L _3 ASSESSMENT/PLAN Cancer Staging Squamous cell lung cancer, left (HCC) Staging form: Lung, AJCC 8th Edition - Clinical stage from 09/27/2016: Stage IIIA (cT2a, cN2, cM0) - Signed by Earlie Server, MD on 09/29/2016  1. Squamous cell lung cancer, left (HCC)   2. Radiation pneumonitis ([place])   3. Dry cough   4. Postnasal drip   . #Discussed CT chest image findings with radiologist Dr.Entrikin.  Patient has radiation pneumonitis. Clinically he has improved on 50 mg of prednisone.  We will start tapering him to 10 mg every week.  Start 40 mg daily today. Continue to hold Durvalumab.   #Otherwise patient to use Claritin and nasal saline wash which may help with the postnasal drip.  All questions were answered. The patient knows to call the clinic with any problems, questions or concerns.  Earlie Server, MD, PhD Hematology Oncology [facility] Pager- [phone] 03/10/2017

## 2017-03-10 ENCOUNTER — Other Ambulatory Visit: Payer: Medicare HMO

## 2017-03-10 ENCOUNTER — Encounter: Payer: Self-pay | Admitting: Oncology

## 2017-03-10 ENCOUNTER — Ambulatory Visit: Payer: Medicare HMO

## 2017-03-10 ENCOUNTER — Inpatient Hospital Stay (HOSPITAL_BASED_OUTPATIENT_CLINIC_OR_DEPARTMENT_OTHER): Payer: Medicare HMO | Admitting: Oncology

## 2017-03-10 VITALS — BP 126/75 | HR 70 | Temp 96.4°F | Wt 254.0 lb

## 2017-03-10 DIAGNOSIS — R0982 Postnasal drip: Secondary | ICD-10-CM

## 2017-03-10 DIAGNOSIS — Z5112 Encounter for antineoplastic immunotherapy: Secondary | ICD-10-CM | POA: Diagnosis not present

## 2017-03-10 DIAGNOSIS — R05 Cough: Secondary | ICD-10-CM

## 2017-03-10 DIAGNOSIS — R058 Other specified cough: Secondary | ICD-10-CM

## 2017-03-10 DIAGNOSIS — C3412 Malignant neoplasm of upper lobe, left bronchus or lung: Secondary | ICD-10-CM | POA: Diagnosis not present

## 2017-03-10 DIAGNOSIS — J700 Acute pulmonary manifestations due to radiation: Secondary | ICD-10-CM

## 2017-03-10 DIAGNOSIS — C3492 Malignant neoplasm of unspecified part of left bronchus or lung: Secondary | ICD-10-CM

## 2017-03-10 MED ORDER — PREDNISONE 10 MG PO TABS: 40.0000 mg | ORAL_TABLET | Freq: Every day | ORAL | 0 refills | Status: DC

## 2017-03-16 NOTE — Progress Notes (Signed)
[facility] [facility] Follow Up Visit  Patient Care Team: Adline Potter, MD as PCP - General (Family Medicine) Telford Nab, RN as Registered Nurse  REASON FOR VISIT:  Follow up for evaluation prior to maintenance immunotherapy  HISTORY OF PRESENT ILLNESS/ PERTINENT ONCOLOGY HISTORY #  [name] 72 y.o. African American male previous healthy, former smoker who presents for treatment of lung cancer. He gets his usual care at [facility].  His cancer history listed as below.  He presented last month to urgent care due to coughing and hemoptysis. X ray showed left lung mass. Cough up small amount of blood mixed in the sputum. Denies SOB, chest pain, back pain, abdominal pain. Denies headache, vision changes. He works a Administrator.  # Images:  PET 09/06/2016  showed hypermetabolic left upper lobe mass as well as the adjacent nodule, ipsilateral hilar and mediastinal lymphadenopathy.   MRI brain showed no intracranial mets. CT showed LUL mass with a satellite lesion as well as mediastinal adenopathy.   CT chest w contrast 08/31/2016 1. Left peripheral upper lobe mass measuring up to 3.5 cm, likely primary lung cancer. 2. 8 mm satellite nodule just anterior to the mass. 3. Aortopulmonary window lymphadenopathy, possibly metastatic.  12/22/2016 CT chest w  3.3 cm left upper lobe mass and adjacent satellite nodule are stable in size, but show new central cavitation consistent with interval response to therapy. Decreased AP window and left hilar lymphadenopathy. No new or progressive metastatic disease.  # Pathology: TPS 50%. 09/22/2016 left upper lobe biopsy showed squamous cell carcinoma. Foundation one test: ordered prior to patient first visit. Showed NO EGFR, ROS1, ALK, BRAF, MET mutation.   Cancer Treatment: S/p concurrent carbo/taxol and Radiation.  Durvalumab Q2 weeks. started 01/27/2017, held after 5 cycles due to radiation pneumonitis.  INTERVAL HISTORY Today [name]  presented for follow up .  Patient takes 40 mg of daily prednisone, cough has significantly improved, he only coughs occasionally..  Denies any shortness of breath.  Appetite is good.  Review of Systems  Constitutional: Negative for appetite change, chills, fatigue and fever.  HENT:   Negative for lump/mass and nosebleeds.        Postnasal drip  Eyes: Negative for eye problems.  Respiratory: Negative for chest tightness and hemoptysis.        Dry cough   Cardiovascular: Negative for chest pain and leg swelling.  Gastrointestinal: Negative for abdominal distention and blood in stool.  Endocrine: Negative for hot flashes.  Genitourinary: Negative for difficulty urinating and dysuria.   Musculoskeletal: Negative for back pain and myalgias.  Skin: Negative for itching.  Neurological: Negative for dizziness and light-headedness.  Hematological: Negative for adenopathy.  Psychiatric/Behavioral: Negative for confusion. The patient is not nervous/anxious.        MEDICAL HISTORY: Past Medical History:  Diagnosis Date  . Cancer ([place])    lung ca  . Enlarged prostate     SURGICAL HISTORY: Past Surgical History:  Procedure Laterality Date  . NO PAST SURGERIES    . PORTACATH PLACEMENT Right 10/05/2016   Procedure: INSERTION PORT-A-CATH;  Surgeon: [name], MD;  Location: [facility] ORS;  Service: General;  Laterality: Right;    SOCIAL HISTORY: Social History   Socioeconomic History  . Marital status: Single    Spouse name: Not on file  . Number of children: Not on file  . Years of education: Not on file  . Highest education level: Not on file  Social Needs  .  Financial resource strain: Not on file  . Food insecurity - worry: Not on file  . Food insecurity - inability: Not on file  . Transportation needs - medical: Not on file  . Transportation needs - non-medical: Not on file  Occupational History  . Not on file  Tobacco Use  . Smoking status: Former Smoker    Packs/day:  1.00    Years: 15.00    Pack years: 15.00    Types: Cigarettes    Last attempt to quit: 09/28/1992    Years since quitting: 24.4  . Smokeless tobacco: Never Used  Substance and Sexual Activity  . Alcohol use: No  . Drug use: No  . Sexual activity: Not on file  Other Topics Concern  . Not on file  Social History Narrative  . Not on file    FAMILY HISTORY Family History  Problem Relation Age of Onset  . Lymphoma Mother   . Diabetes Mother   . Lung cancer Sister   . Lung cancer Brother   . Lung cancer Brother     ALLERGIES:  has No Known Allergies.  MEDICATIONS:  Current Outpatient Medications  Medication Sig Dispense Refill  . acetaminophen (TYLENOL) 500 MG tablet Take 1,000 mg by mouth at bedtime.    . Cholecalciferol (VITAMIN D3) 2000 units capsule Take 1 capsule (2,000 Units total) by mouth daily.    . cyanocobalamin 500 MCG tablet Take 500 mcg by mouth daily.    . fluticasone (FLONASE) 50 MCG/ACT nasal spray Place 2 sprays into both nostrils daily. 16 g 6  . lidocaine-prilocaine (EMLA) cream Apply to affected area once 30 g 3  . mupirocin ointment (BACTROBAN) 2 % Apply 1 application topically 2 (two) times daily as needed. 22 g 2  . ondansetron (ZOFRAN) 8 MG tablet Take 1 tablet (8 mg total) by mouth 2 (two) times daily as needed for refractory nausea / vomiting. Start on day 3 after chemo. 30 tablet 1  . predniSONE (DELTASONE) 10 MG tablet Take 4 tablets (40 mg total) by mouth daily with breakfast. 120 tablet 0  . prochlorperazine (COMPAZINE) 10 MG tablet Take 1 tablet (10 mg total) by mouth every 6 (six) hours as needed (Nausea or vomiting). 30 tablet 1  . terazosin (HYTRIN) 1 MG capsule Take 1 capsule (1 mg total) by mouth at bedtime.    Marland Kitchen omeprazole (PRILOSEC) 20 MG capsule Take 1 capsule (20 mg total) by mouth daily. 30 capsule 1   No current facility-administered medications for this visit.     PHYSICAL EXAMINATION:  ECOG PERFORMANCE STATUS: 0 -  Asymptomatic   Vitals:   03/17/17 0846 03/17/17 0849  BP: (!) 143/79   Pulse: 83   Resp:    Temp: 98.2 F (36.8 C)   SpO2:  99%   Filed Weights   03/17/17 0843  Weight: 258 lb 3.2 oz (117.1 kg)  Physical Exam  Constitutional: He is oriented to person, place, and time and well-developed, well-nourished, and in no distress.  HENT:  Head: Normocephalic and atraumatic.  Mouth/Throat: No oropharyngeal exudate.  Eyes: Conjunctivae and EOM are normal. Pupils are equal, round, and reactive to light. No scleral icterus.  Neck: Normal range of motion. Neck supple.  Cardiovascular: Normal rate, regular rhythm and normal heart sounds.  Pulmonary/Chest: Effort normal and breath sounds normal. No respiratory distress. He has no wheezes.  Abdominal: Soft. Bowel sounds are normal. He exhibits no distension. There is no tenderness.  Musculoskeletal: Normal range of motion.  He exhibits no edema.  Lymphadenopathy:    He has no cervical adenopathy.  Neurological: He is alert and oriented to person, place, and time. No cranial nerve deficit.  Skin: Skin is warm and dry. No rash noted. He is not diaphoretic.  Psychiatric: Affect normal.      LABORATORY DATA: I have personally reviewed the data as listed: CBC    Component Value Date/Time   WBC 4.9 02/24/2017 0903   RBC 4.30 (L) 02/24/2017 0903   HGB 12.7 (L) 02/24/2017 0903   HCT 38.8 (L) 02/24/2017 0903   PLT 179 02/24/2017 0903   MCV 90.2 02/24/2017 0903   MCH 29.6 02/24/2017 0903   MCHC 32.9 02/24/2017 0903   RDW 15.5 (H) 02/24/2017 0903   LYMPHSABS 0.6 (L) 02/24/2017 0903   MONOABS 0.4 02/24/2017 0903   EOSABS 0.2 02/24/2017 0903   BASOSABS 0.0 02/24/2017 0903    CMP Latest Ref Rng & Units 02/24/2017 02/10/2017 01/27/2017  Glucose 65 - 99 mg/dL 108(H) 108(H) 111(H)  BUN 6 - 20 mg/dL '17 18 18  ' Creatinine 0.61 - 1.24 mg/dL 1.02 1.01 1.01  Sodium 135 - 145 mmol/L 137 136 137  Potassium 3.5 - 5.1 mmol/L 3.8 4.1 3.8  Chloride 101 -  111 mmol/L 105 106 106  CO2 22 - 32 mmol/L '25 24 24  ' Calcium 8.9 - 10.3 mg/dL 9.1 9.0 9.2  Total Protein 6.5 - 8.1 g/dL 7.7 7.3 7.5  Total Bilirubin 0.3 - 1.2 mg/dL 1.0 1.0 1.1  Alkaline Phos 38 - 126 U/L 64 57 53  AST 15 - 41 U/L '19 21 21  ' ALT 17 - 63 U/L '18 17 19     ' ASSESSMENT/PLAN Cancer Staging Squamous cell lung cancer, left (HCC) Staging form: Lung, AJCC 8th Edition - Clinical stage from 09/27/2016: Stage IIIA (cT2a, cN2, cM0) - Signed by Earlie Server, MD on 09/29/2016  1. Squamous cell lung cancer, left (HCC)   2. Radiation pneumonitis (HCC)    Patient's radiation pneumonitis clinically improving on steroids.  We will continue to taper down.  Advised patient to start taking 30 mg of prednisone today for a week, next week.tapered down to 20 mg of prednisone if he continues to feel well with no increase of respiratory symptoms.  He was see me in approximately 2 weeks also and by then he should have been tapered down to 10 mg prednisone..  Continue to hold Durvalumab.   Continue to use Claritin and nasal saline wash which may help with the postnasal drip.  All questions were answered. The patient knows to call the clinic with any problems, questions or concerns.  Earlie Server, MD, PhD Hematology Oncology [facility] Pager- [phone] 03/17/2017

## 2017-03-17 ENCOUNTER — Other Ambulatory Visit: Payer: Self-pay

## 2017-03-17 ENCOUNTER — Inpatient Hospital Stay: Payer: Medicare HMO | Attending: Oncology | Admitting: Oncology

## 2017-03-17 ENCOUNTER — Encounter: Payer: Self-pay | Admitting: Oncology

## 2017-03-17 VITALS — BP 143/79 | HR 83 | Temp 98.2°F | Resp 16 | Ht 71.0 in | Wt 258.2 lb

## 2017-03-17 DIAGNOSIS — Z87891 Personal history of nicotine dependence: Secondary | ICD-10-CM | POA: Insufficient documentation

## 2017-03-17 DIAGNOSIS — Y842 Radiological procedure and radiotherapy as the cause of abnormal reaction of the patient, or of later complication, without mention of misadventure at the time of the procedure: Secondary | ICD-10-CM | POA: Diagnosis not present

## 2017-03-17 DIAGNOSIS — I2510 Atherosclerotic heart disease of native coronary artery without angina pectoris: Secondary | ICD-10-CM | POA: Insufficient documentation

## 2017-03-17 DIAGNOSIS — C3492 Malignant neoplasm of unspecified part of left bronchus or lung: Secondary | ICD-10-CM | POA: Diagnosis not present

## 2017-03-17 DIAGNOSIS — R05 Cough: Secondary | ICD-10-CM

## 2017-03-17 DIAGNOSIS — J700 Acute pulmonary manifestations due to radiation: Secondary | ICD-10-CM | POA: Diagnosis not present

## 2017-03-17 DIAGNOSIS — Z79899 Other long term (current) drug therapy: Secondary | ICD-10-CM | POA: Insufficient documentation

## 2017-03-17 DIAGNOSIS — Z801 Family history of malignant neoplasm of trachea, bronchus and lung: Secondary | ICD-10-CM | POA: Insufficient documentation

## 2017-03-17 MED ORDER — OMEPRAZOLE 20 MG PO CPDR: 20.0000 mg | DELAYED_RELEASE_CAPSULE | Freq: Every day | ORAL | 1 refills | Status: DC

## 2017-03-17 NOTE — Progress Notes (Signed)
Patient here for follow up no changes since last appt.

## 2017-04-02 NOTE — Progress Notes (Signed)
[facility] Follow Up Visit  Patient Care Team: Adline Potter, MD as PCP - General (Family Medicine) Telford Nab, RN as Registered Nurse  REASON FOR VISIT:  Follow up for evaluation prior to maintenance immunotherapy  HISTORY OF PRESENT ILLNESS/ PERTINENT ONCOLOGY HISTORY #  [name] 72 y.o. African American male previous healthy, former smoker who presents for treatment of lung cancer. He gets his usual care at [facility].  His cancer history listed as below.  He presented last month to urgent care due to coughing and hemoptysis. X ray showed left lung mass. Cough up small amount of blood mixed in the sputum. Denies SOB, chest pain, back pain, abdominal pain. Denies headache, vision changes. He works a Administrator.  # Images:  PET 09/06/2016  showed hypermetabolic left upper lobe mass as well as the adjacent nodule, ipsilateral hilar and mediastinal lymphadenopathy.   MRI brain showed no intracranial mets. CT showed LUL mass with a satellite lesion as well as mediastinal adenopathy.   CT chest w contrast 08/31/2016 1. Left peripheral upper lobe mass measuring up to 3.5 cm, likely primary lung cancer. 2. 8 mm satellite nodule just anterior to the mass. 3. Aortopulmonary window lymphadenopathy, possibly metastatic.  12/22/2016 CT chest w  3.3 cm left upper lobe mass and adjacent satellite nodule are stable in size, but show new central cavitation consistent with interval response to therapy. Decreased AP window and left hilar lymphadenopathy. No new or progressive metastatic disease.  03/08/2017 CT chest w 1. Today's study demonstrates a positive response to therapy with decreased size of left upper lobe cavitary nodule, and involving postradiation changes in the left lung, as above. 2. Mild diffuse bronchial thickening with mild centrilobular and paraseptal emphysema; imaging findings suggestive of underlying COPD. 3. Aortic atherosclerosis, in addition to three-vessel  coronary artery disease.   # Pathology: TPS 50%. 09/22/2016 left upper lobe biopsy showed squamous cell carcinoma. Foundation one test: ordered prior to patient first visit. Showed NO EGFR, ROS1, ALK, BRAF, MET mutation.   Cancer Treatment: S/p concurrent carbo/taxol and Radiation.  Durvalumab Q2 weeks. started 01/27/2017, held after 5 cycles due to radiation pneumonitis.  INTERVAL HISTORY Today [name] presented for follow up .  Patient has tapered his prednisone to 10 mg today. Cough has continue to improve. No SOB, chest pain,abdominal pain. Appetite is good.   Review of Systems  Constitutional: Negative for appetite change, chills, fatigue and fever.  HENT:   Negative for lump/mass and nosebleeds.        Postnasal drip  Eyes: Negative for eye problems.  Respiratory: Negative for chest tightness, cough and hemoptysis.   Cardiovascular: Negative for chest pain and leg swelling.  Gastrointestinal: Negative for abdominal distention and blood in stool.  Endocrine: Negative for hot flashes.  Genitourinary: Negative for difficulty urinating.   Musculoskeletal: Negative for myalgias.  Skin: Negative for itching.  Neurological: Negative for dizziness and light-headedness.  Hematological: Negative for adenopathy.  Psychiatric/Behavioral: Negative for confusion. The patient is not nervous/anxious.        MEDICAL HISTORY: Past Medical History:  Diagnosis Date  . Cancer ([place])    lung ca  . Enlarged prostate     SURGICAL HISTORY: Past Surgical History:  Procedure Laterality Date  . NO PAST SURGERIES    . PORTACATH PLACEMENT Right 10/05/2016   Procedure: INSERTION PORT-A-CATH;  Surgeon: [name], MD;  Location: [facility] ORS;  Service: General;  Laterality: Right;    SOCIAL HISTORY: Social  History   Socioeconomic History  . Marital status: Single    Spouse name: Not on file  . Number of children: Not on file  . Years of education: Not on file  . Highest education  level: Not on file  Social Needs  . Financial resource strain: Not on file  . Food insecurity - worry: Not on file  . Food insecurity - inability: Not on file  . Transportation needs - medical: Not on file  . Transportation needs - non-medical: Not on file  Occupational History  . Not on file  Tobacco Use  . Smoking status: Former Smoker    Packs/day: 1.00    Years: 15.00    Pack years: 15.00    Types: Cigarettes    Last attempt to quit: 09/28/1992    Years since quitting: 24.5  . Smokeless tobacco: Never Used  Substance and Sexual Activity  . Alcohol use: No  . Drug use: No  . Sexual activity: Not on file  Other Topics Concern  . Not on file  Social History Narrative  . Not on file    FAMILY HISTORY Family History  Problem Relation Age of Onset  . Lymphoma Mother   . Diabetes Mother   . Lung cancer Sister   . Lung cancer Brother   . Lung cancer Brother     ALLERGIES:  has No Known Allergies.  MEDICATIONS:  Current Outpatient Medications  Medication Sig Dispense Refill  . acetaminophen (TYLENOL) 500 MG tablet Take 1,000 mg by mouth at bedtime.    . Cholecalciferol (VITAMIN D3) 2000 units capsule Take 1 capsule (2,000 Units total) by mouth daily.    . cyanocobalamin 500 MCG tablet Take 500 mcg by mouth daily.    . fluticasone (FLONASE) 50 MCG/ACT nasal spray Place 2 sprays into both nostrils daily. 16 g 6  . lidocaine-prilocaine (EMLA) cream Apply to affected area once 30 g 3  . mupirocin ointment (BACTROBAN) 2 % Apply 1 application topically 2 (two) times daily as needed. 22 g 2  . omeprazole (PRILOSEC) 20 MG capsule Take 1 capsule (20 mg total) by mouth daily. 30 capsule 1  . ondansetron (ZOFRAN) 8 MG tablet Take 1 tablet (8 mg total) by mouth 2 (two) times daily as needed for refractory nausea / vomiting. Start on day 3 after chemo. 30 tablet 1  . predniSONE (DELTASONE) 10 MG tablet Take 4 tablets (40 mg total) by mouth daily with breakfast. 120 tablet 0  .  prochlorperazine (COMPAZINE) 10 MG tablet Take 1 tablet (10 mg total) by mouth every 6 (six) hours as needed (Nausea or vomiting). 30 tablet 1  . terazosin (HYTRIN) 1 MG capsule Take 1 capsule (1 mg total) by mouth at bedtime.     No current facility-administered medications for this visit.     PHYSICAL EXAMINATION:  ECOG PERFORMANCE STATUS: 0 - Asymptomatic   Vitals:   04/03/17 0849  BP: 109/71  Pulse: 87  Temp: (!) 96.5 F (35.8 C)  SpO2: 97%   Filed Weights   04/03/17 0849  Weight: 255 lb 2 oz (115.7 kg)  Physical Exam  Constitutional: He is oriented to person, place, and time and well-developed, well-nourished, and in no distress.  HENT:  Head: Normocephalic and atraumatic.  Mouth/Throat: No oropharyngeal exudate.  Eyes: Conjunctivae and EOM are normal. Pupils are equal, round, and reactive to light. No scleral icterus.  Neck: Normal range of motion. Neck supple.  Cardiovascular: Normal rate, regular rhythm and normal heart  sounds.  Pulmonary/Chest: Effort normal and breath sounds normal. No respiratory distress. He has no wheezes.  Abdominal: Soft. Bowel sounds are normal. He exhibits no distension. There is no tenderness.  Musculoskeletal: Normal range of motion. He exhibits no edema.  Lymphadenopathy:    He has no cervical adenopathy.  Neurological: He is alert and oriented to person, place, and time. No cranial nerve deficit.  Skin: Skin is warm and dry. No rash noted. He is not diaphoretic.  Psychiatric: Affect normal.      LABORATORY DATA: I have personally reviewed the data as listed: CBC    Component Value Date/Time   WBC 6.1 04/03/2017 0749   RBC 4.32 (L) 04/03/2017 0749   HGB 13.2 04/03/2017 0749   HCT 38.9 (L) 04/03/2017 0749   PLT 153 04/03/2017 0749   MCV 90.1 04/03/2017 0749   MCH 30.6 04/03/2017 0749   MCHC 33.9 04/03/2017 0749   RDW 13.7 04/03/2017 0749   LYMPHSABS 0.5 (L) 04/03/2017 0749   MONOABS 0.6 04/03/2017 0749   EOSABS 0.1  04/03/2017 0749   BASOSABS 0.0 04/03/2017 0749    CMP Latest Ref Rng & Units 04/03/2017 02/24/2017 02/10/2017  Glucose 65 - 99 mg/dL 93 108(H) 108(H)  BUN 6 - 20 mg/dL '17 17 18  ' Creatinine 0.61 - 1.24 mg/dL 1.16 1.02 1.01  Sodium 135 - 145 mmol/L 135 137 136  Potassium 3.5 - 5.1 mmol/L 3.6 3.8 4.1  Chloride 101 - 111 mmol/L 104 105 106  CO2 22 - 32 mmol/L '25 25 24  ' Calcium 8.9 - 10.3 mg/dL 8.9 9.1 9.0  Total Protein 6.5 - 8.1 g/dL 6.8 7.7 7.3  Total Bilirubin 0.3 - 1.2 mg/dL 1.0 1.0 1.0  Alkaline Phos 38 - 126 U/L 50 64 57  AST 15 - 41 U/L '17 19 21  ' ALT 17 - 63 U/L '19 18 17     ' ASSESSMENT/PLAN Cancer Staging Squamous cell lung cancer, left (HCC) Staging form: Lung, AJCC 8th Edition - Clinical stage from 09/27/2016: Stage IIIA (cT2a, cN2, cM0) - Signed by Earlie Server, MD on 09/29/2016  1. Squamous cell lung cancer, left (HCC)   2. Radiation pneumonitis ([place])   3. Encounter for antineoplastic immunotherapy   4. Atherosclerotic cardiovascular disease    #Patient's radiation pneumonitis clinically improving on steroids. His dosage of steroids have been tapered down to 87m. Advise patient to continue 154mthis week and start to take 1075mvery other day after completing one week of 10 mg.   Continue to hold Durvalumab today. Plan to restarted treatment on 04/14/2017.    #Continue to use Claritin and nasal saline wash which may help with the postnasal drip. # Atherosclerotic disease of three vessels: Advise patient to [facility] and be referred to see cardiologist for further evaluation. Life style modification discussed with patient.   All questions were answered. The patient knows to call the clinic with any problems, questions or concerns.  ZhoEarlie ServerD, PhD Hematology Oncology [facility]- [phone]

## 2017-04-03 ENCOUNTER — Inpatient Hospital Stay: Payer: Medicare HMO

## 2017-04-03 ENCOUNTER — Other Ambulatory Visit: Payer: Self-pay

## 2017-04-03 ENCOUNTER — Encounter: Payer: Self-pay | Admitting: *Deleted

## 2017-04-03 ENCOUNTER — Encounter: Payer: Self-pay | Admitting: Oncology

## 2017-04-03 ENCOUNTER — Inpatient Hospital Stay (HOSPITAL_BASED_OUTPATIENT_CLINIC_OR_DEPARTMENT_OTHER): Payer: Medicare HMO | Admitting: Oncology

## 2017-04-03 VITALS — BP 109/71 | HR 87 | Temp 96.5°F | Wt 255.1 lb

## 2017-04-03 DIAGNOSIS — Z87891 Personal history of nicotine dependence: Secondary | ICD-10-CM | POA: Diagnosis not present

## 2017-04-03 DIAGNOSIS — I2510 Atherosclerotic heart disease of native coronary artery without angina pectoris: Secondary | ICD-10-CM

## 2017-04-03 DIAGNOSIS — Y842 Radiological procedure and radiotherapy as the cause of abnormal reaction of the patient, or of later complication, without mention of misadventure at the time of the procedure: Secondary | ICD-10-CM | POA: Diagnosis not present

## 2017-04-03 DIAGNOSIS — Z95828 Presence of other vascular implants and grafts: Secondary | ICD-10-CM

## 2017-04-03 DIAGNOSIS — C3492 Malignant neoplasm of unspecified part of left bronchus or lung: Secondary | ICD-10-CM

## 2017-04-03 DIAGNOSIS — R05 Cough: Secondary | ICD-10-CM | POA: Diagnosis not present

## 2017-04-03 DIAGNOSIS — Z801 Family history of malignant neoplasm of trachea, bronchus and lung: Secondary | ICD-10-CM

## 2017-04-03 DIAGNOSIS — J700 Acute pulmonary manifestations due to radiation: Secondary | ICD-10-CM

## 2017-04-03 DIAGNOSIS — Z79899 Other long term (current) drug therapy: Secondary | ICD-10-CM

## 2017-04-03 DIAGNOSIS — Z5111 Encounter for antineoplastic chemotherapy: Secondary | ICD-10-CM

## 2017-04-03 DIAGNOSIS — D696 Thrombocytopenia, unspecified: Secondary | ICD-10-CM

## 2017-04-03 LAB — COMPREHENSIVE METABOLIC PANEL
ALK PHOS: 50 U/L (ref 38–126)
ALT: 19 U/L (ref 17–63)
AST: 17 U/L (ref 15–41)
Albumin: 3.6 g/dL (ref 3.5–5.0)
Anion gap: 6 (ref 5–15)
BUN: 17 mg/dL (ref 6–20)
CALCIUM: 8.9 mg/dL (ref 8.9–10.3)
CHLORIDE: 104 mmol/L (ref 101–111)
CO2: 25 mmol/L (ref 22–32)
CREATININE: 1.16 mg/dL (ref 0.61–1.24)
GFR calc Af Amer: >60 mL/min (ref >60)
Glucose, Bld: 93 mg/dL (ref 65–99)
Potassium: 3.6 mmol/L (ref 3.5–5.1)
Sodium: 135 mmol/L (ref 135–145)
Total Bilirubin: 1 mg/dL (ref 0.3–1.2)
Total Protein: 6.8 g/dL (ref 6.5–8.1)

## 2017-04-03 LAB — CBC WITH DIFFERENTIAL/PLATELET
Basophils Absolute: 0 10*3/uL (ref 0–0.1)
Basophils Relative: 0 %
EOS ABS: 0.1 10*3/uL (ref 0–0.7)
EOS PCT: 1 %
HCT: 38.9 % — ABNORMAL LOW (ref 40.0–52.0)
Hemoglobin: 13.2 g/dL (ref 13.0–18.0)
LYMPHS ABS: 0.5 10*3/uL — AB (ref 1.0–3.6)
Lymphocytes Relative: 9 %
MCH: 30.6 pg (ref 26.0–34.0)
MCHC: 33.9 g/dL (ref 32.0–36.0)
MCV: 90.1 fL (ref 80.0–100.0)
MONOS PCT: 10 %
Monocytes Absolute: 0.6 10*3/uL (ref 0.2–1.0)
Neutro Abs: 4.9 10*3/uL (ref 1.4–6.5)
Neutrophils Relative %: 80 %
PLATELETS: 153 10*3/uL (ref 150–440)
RBC: 4.32 MIL/uL — ABNORMAL LOW (ref 4.40–5.90)
RDW: 13.7 % (ref 11.5–14.5)
WBC: 6.1 10*3/uL (ref 3.8–10.6)

## 2017-04-03 LAB — TSH: TSH: 1.725 u[IU]/mL (ref 0.350–4.500)

## 2017-04-03 MED ORDER — HEPARIN SOD (PORK) LOCK FLUSH 100 UNIT/ML IV SOLN
500.0000 [IU] | INTRAVENOUS | Status: AC | PRN
  Administered 2017-04-03: 500 [IU]

## 2017-04-03 MED ORDER — SODIUM CHLORIDE 0.9% FLUSH
10.0000 mL | INTRAVENOUS | Status: AC | PRN
  Administered 2017-04-03: 10 mL
  Filled 2017-04-03: qty 10

## 2017-04-03 NOTE — Progress Notes (Signed)
Patient here today for follow up.  Patient c/o increasing in urinary urgency.

## 2017-04-14 ENCOUNTER — Inpatient Hospital Stay (HOSPITAL_BASED_OUTPATIENT_CLINIC_OR_DEPARTMENT_OTHER): Payer: Medicare HMO | Admitting: Oncology

## 2017-04-14 ENCOUNTER — Inpatient Hospital Stay: Payer: Medicare HMO

## 2017-04-14 ENCOUNTER — Encounter: Payer: Self-pay | Admitting: Oncology

## 2017-04-14 ENCOUNTER — Inpatient Hospital Stay: Payer: Medicare HMO | Attending: Oncology

## 2017-04-14 VITALS — BP 131/83 | HR 88 | Temp 98.5°F | Ht 71.0 in | Wt 254.6 lb

## 2017-04-14 DIAGNOSIS — Z801 Family history of malignant neoplasm of trachea, bronchus and lung: Secondary | ICD-10-CM

## 2017-04-14 DIAGNOSIS — I2510 Atherosclerotic heart disease of native coronary artery without angina pectoris: Secondary | ICD-10-CM | POA: Diagnosis not present

## 2017-04-14 DIAGNOSIS — J700 Acute pulmonary manifestations due to radiation: Secondary | ICD-10-CM | POA: Diagnosis not present

## 2017-04-14 DIAGNOSIS — Z87891 Personal history of nicotine dependence: Secondary | ICD-10-CM

## 2017-04-14 DIAGNOSIS — C3492 Malignant neoplasm of unspecified part of left bronchus or lung: Secondary | ICD-10-CM

## 2017-04-14 DIAGNOSIS — Z5112 Encounter for antineoplastic immunotherapy: Secondary | ICD-10-CM | POA: Diagnosis not present

## 2017-04-14 DIAGNOSIS — Z5111 Encounter for antineoplastic chemotherapy: Secondary | ICD-10-CM

## 2017-04-14 DIAGNOSIS — J189 Pneumonia, unspecified organism: Secondary | ICD-10-CM

## 2017-04-14 DIAGNOSIS — R05 Cough: Secondary | ICD-10-CM | POA: Diagnosis not present

## 2017-04-14 DIAGNOSIS — C3412 Malignant neoplasm of upper lobe, left bronchus or lung: Secondary | ICD-10-CM

## 2017-04-14 DIAGNOSIS — Z79899 Other long term (current) drug therapy: Secondary | ICD-10-CM

## 2017-04-14 DIAGNOSIS — D696 Thrombocytopenia, unspecified: Secondary | ICD-10-CM

## 2017-04-14 LAB — CBC WITH DIFFERENTIAL/PLATELET
Basophils Absolute: 0 10*3/uL (ref 0–0.1)
Basophils Relative: 1 %
EOS ABS: 0.1 10*3/uL (ref 0–0.7)
Eosinophils Relative: 1 %
HEMATOCRIT: 38.4 % — AB (ref 40.0–52.0)
Hemoglobin: 12.9 g/dL — ABNORMAL LOW (ref 13.0–18.0)
LYMPHS ABS: 0.5 10*3/uL — AB (ref 1.0–3.6)
LYMPHS PCT: 10 %
MCH: 30.4 pg (ref 26.0–34.0)
MCHC: 33.7 g/dL (ref 32.0–36.0)
MCV: 90.3 fL (ref 80.0–100.0)
Monocytes Absolute: 0.4 10*3/uL (ref 0.2–1.0)
Monocytes Relative: 9 %
NEUTROS ABS: 3.8 10*3/uL (ref 1.4–6.5)
NEUTROS PCT: 79 %
Platelets: 203 10*3/uL (ref 150–440)
RBC: 4.25 MIL/uL — AB (ref 4.40–5.90)
RDW: 13 % (ref 11.5–14.5)
WBC: 4.9 10*3/uL (ref 3.8–10.6)

## 2017-04-14 LAB — COMPREHENSIVE METABOLIC PANEL
ALK PHOS: 54 U/L (ref 38–126)
ALT: 16 U/L — ABNORMAL LOW (ref 17–63)
AST: 18 U/L (ref 15–41)
Albumin: 3.7 g/dL (ref 3.5–5.0)
Anion gap: 9 (ref 5–15)
BUN: 17 mg/dL (ref 6–20)
CALCIUM: 9 mg/dL (ref 8.9–10.3)
CO2: 22 mmol/L (ref 22–32)
Chloride: 109 mmol/L (ref 101–111)
Creatinine, Ser: 1.09 mg/dL (ref 0.61–1.24)
GFR calc non Af Amer: >60 mL/min (ref >60)
GLUCOSE: 100 mg/dL — AB (ref 65–99)
Potassium: 4 mmol/L (ref 3.5–5.1)
SODIUM: 140 mmol/L (ref 135–145)
Total Bilirubin: 0.6 mg/dL (ref 0.3–1.2)
Total Protein: 7.2 g/dL (ref 6.5–8.1)

## 2017-04-14 LAB — TSH: TSH: 1.039 u[IU]/mL (ref 0.350–4.500)

## 2017-04-14 MED ORDER — SODIUM CHLORIDE 0.9 % IV SOLN
10.0000 mg/kg | Freq: Once | INTRAVENOUS | Status: AC
  Administered 2017-04-14: 1120 mg via INTRAVENOUS
  Filled 2017-04-14: qty 2.4

## 2017-04-14 MED ORDER — SODIUM CHLORIDE 0.9% FLUSH
10.0000 mL | INTRAVENOUS | Status: DC | PRN
  Administered 2017-04-14: 10 mL
  Filled 2017-04-14: qty 10

## 2017-04-14 MED ORDER — SODIUM CHLORIDE 0.9 % IV SOLN
Freq: Once | INTRAVENOUS | Status: AC
  Administered 2017-04-14: 10:00:00 via INTRAVENOUS
  Filled 2017-04-14: qty 1000

## 2017-04-14 MED ORDER — HEPARIN SOD (PORK) LOCK FLUSH 100 UNIT/ML IV SOLN
500.0000 [IU] | Freq: Once | INTRAVENOUS | Status: AC | PRN
  Administered 2017-04-14: 500 [IU]
  Filled 2017-04-14: qty 5

## 2017-04-14 NOTE — Progress Notes (Signed)
No new changes noted today 

## 2017-04-14 NOTE — Progress Notes (Signed)
[facility] Follow Up Visit  Patient Care Team: Adline Potter, MD as PCP - General (Family Medicine) Telford Nab, RN as Registered Nurse  REASON FOR VISIT:  Follow up for evaluation prior to maintenance immunotherapy  HISTORY OF PRESENT ILLNESS/ PERTINENT ONCOLOGY HISTORY #  [name] 72 y.o. African American male previous healthy, former smoker who presents for treatment of lung cancer. He gets his usual care at [facility].  His cancer history listed as below.  He presented last month to urgent care due to coughing and hemoptysis. X ray showed left lung mass. Cough up small amount of blood mixed in the sputum. Denies SOB, chest pain, back pain, abdominal pain. Denies headache, vision changes. He works a Administrator.  # Images:  PET 09/06/2016  showed hypermetabolic left upper lobe mass as well as the adjacent nodule, ipsilateral hilar and mediastinal lymphadenopathy.   MRI brain showed no intracranial mets. CT showed LUL mass with a satellite lesion as well as mediastinal adenopathy.   CT chest w contrast 08/31/2016 1. Left peripheral upper lobe mass measuring up to 3.5 cm, likely primary lung cancer. 2. 8 mm satellite nodule just anterior to the mass. 3. Aortopulmonary window lymphadenopathy, possibly metastatic.  12/22/2016 CT chest w  3.3 cm left upper lobe mass and adjacent satellite nodule are stable in size, but show new central cavitation consistent with interval response to therapy. Decreased AP window and left hilar lymphadenopathy. No new or progressive metastatic disease.  03/08/2017 CT chest w 1. Today's study demonstrates a positive response to therapy with decreased size of left upper lobe cavitary nodule, and involving postradiation changes in the left lung, as above. 2. Mild diffuse bronchial thickening with mild centrilobular and paraseptal emphysema; imaging findings suggestive of underlying COPD. 3. Aortic atherosclerosis, in addition to three-vessel  coronary artery disease.   # Pathology: TPS 50%. 09/22/2016 left upper lobe biopsy showed squamous cell carcinoma. Foundation one test: ordered prior to patient first visit. Showed NO EGFR, ROS1, ALK, BRAF, MET mutation.   Cancer Treatment: S/p concurrent carbo/taxol and Radiation.  Durvalumab Q2 weeks. started 01/27/2017, held after 5 cycles due to radiation pneumonitis.  INTERVAL HISTORY Today [name] presented for follow up .  Patient has tapered his prednisone to 10 mg every other day and cough has continue to improve. No shortness of breath, rash, chest pain, abdominal pain. Appetite is good.   Review of Systems  Constitutional: Negative for appetite change, chills, fatigue and fever.  HENT:   Negative for lump/mass, mouth sores and nosebleeds.        Postnasal drip  Eyes: Negative for eye problems.  Respiratory: Negative for chest tightness, cough and hemoptysis.   Cardiovascular: Negative for chest pain and leg swelling.  Gastrointestinal: Negative for abdominal distention and blood in stool.  Endocrine: Negative for hot flashes.  Genitourinary: Negative for difficulty urinating, dysuria and frequency.   Musculoskeletal: Negative for back pain, flank pain, gait problem and myalgias.  Skin: Negative for itching and rash.  Neurological: Negative for dizziness, gait problem and light-headedness.  Hematological: Negative for adenopathy. Does not bruise/bleed easily.  Psychiatric/Behavioral: Negative for confusion. The patient is not nervous/anxious.        MEDICAL HISTORY: Past Medical History:  Diagnosis Date  . Cancer ([place])    lung ca  . Enlarged prostate     SURGICAL HISTORY: Past Surgical History:  Procedure Laterality Date  . NO PAST SURGERIES    . PORTACATH PLACEMENT Right  10/05/2016   Procedure: INSERTION PORT-A-CATH;  Surgeon: [name], MD;  Location: [facility] ORS;  Service: General;  Laterality: Right;    SOCIAL HISTORY: Social History    Socioeconomic History  . Marital status: Single    Spouse name: Not on file  . Number of children: Not on file  . Years of education: Not on file  . Highest education level: Not on file  Social Needs  . Financial resource strain: Not on file  . Food insecurity - worry: Not on file  . Food insecurity - inability: Not on file  . Transportation needs - medical: Not on file  . Transportation needs - non-medical: Not on file  Occupational History  . Not on file  Tobacco Use  . Smoking status: Former Smoker    Packs/day: 1.00    Years: 15.00    Pack years: 15.00    Types: Cigarettes    Last attempt to quit: 09/28/1992    Years since quitting: 24.5  . Smokeless tobacco: Never Used  Substance and Sexual Activity  . Alcohol use: No  . Drug use: No  . Sexual activity: Not on file  Other Topics Concern  . Not on file  Social History Narrative  . Not on file    FAMILY HISTORY Family History  Problem Relation Age of Onset  . Lymphoma Mother   . Diabetes Mother   . Lung cancer Sister   . Lung cancer Brother   . Lung cancer Brother     ALLERGIES:  has No Known Allergies.  MEDICATIONS:  Current Outpatient Medications  Medication Sig Dispense Refill  . acetaminophen (TYLENOL) 500 MG tablet Take 1,000 mg by mouth at bedtime.    . Cholecalciferol (VITAMIN D3) 2000 units capsule Take 1 capsule (2,000 Units total) by mouth daily.    . cyanocobalamin 500 MCG tablet Take 500 mcg by mouth daily.    . fluticasone (FLONASE) 50 MCG/ACT nasal spray Place 2 sprays into both nostrils daily. 16 g 6  . lidocaine-prilocaine (EMLA) cream Apply to affected area once 30 g 3  . mupirocin ointment (BACTROBAN) 2 % Apply 1 application topically 2 (two) times daily as needed. 22 g 2  . omeprazole (PRILOSEC) 20 MG capsule Take 1 capsule (20 mg total) by mouth daily. 30 capsule 1  . ondansetron (ZOFRAN) 8 MG tablet Take 1 tablet (8 mg total) by mouth 2 (two) times daily as needed for refractory nausea  / vomiting. Start on day 3 after chemo. 30 tablet 1  . predniSONE (DELTASONE) 10 MG tablet Take 4 tablets (40 mg total) by mouth daily with breakfast. 120 tablet 0  . prochlorperazine (COMPAZINE) 10 MG tablet Take 1 tablet (10 mg total) by mouth every 6 (six) hours as needed (Nausea or vomiting). (Patient not taking: Reported on 04/14/2017) 30 tablet 1  . terazosin (HYTRIN) 1 MG capsule Take 1 capsule (1 mg total) by mouth at bedtime. (Patient not taking: Reported on 04/14/2017)     No current facility-administered medications for this visit.     PHYSICAL EXAMINATION:  ECOG PERFORMANCE STATUS: 0 - Asymptomatic   Vitals:   04/14/17 0937  BP: 131/83  Pulse: 88  Temp: 98.5 F (36.9 C)  SpO2: 98%   Filed Weights   04/14/17 0937  Weight: 254 lb 9.6 oz (115.5 kg)  Physical Exam  Constitutional: He is oriented to person, place, and time and well-developed, well-nourished, and in no distress. No distress.  HENT:  Head: Normocephalic and atraumatic.  Mouth/Throat: No oropharyngeal exudate.  Eyes: Conjunctivae and EOM are normal. Pupils are equal, round, and reactive to light. Right eye exhibits no discharge. No scleral icterus.  Neck: Normal range of motion. Neck supple. No JVD present.  Cardiovascular: Normal rate, regular rhythm and normal heart sounds.  Pulmonary/Chest: Effort normal and breath sounds normal. No respiratory distress. He has no wheezes. He has no rales. He exhibits no tenderness.  Abdominal: Soft. Bowel sounds are normal. He exhibits no distension. There is no tenderness. There is no guarding.  Musculoskeletal: Normal range of motion. He exhibits no edema or tenderness.  Lymphadenopathy:    He has no cervical adenopathy.  Neurological: He is alert and oriented to person, place, and time. No cranial nerve deficit.  Skin: Skin is warm and dry. No rash noted. He is not diaphoretic.  Psychiatric: Affect and judgment normal.      LABORATORY DATA: I have personally  reviewed the data as listed: CBC    Component Value Date/Time   WBC 4.9 04/14/2017 0916   RBC 4.25 (L) 04/14/2017 0916   HGB 12.9 (L) 04/14/2017 0916   HCT 38.4 (L) 04/14/2017 0916   PLT 203 04/14/2017 0916   MCV 90.3 04/14/2017 0916   MCH 30.4 04/14/2017 0916   MCHC 33.7 04/14/2017 0916   RDW 13.0 04/14/2017 0916   LYMPHSABS 0.5 (L) 04/14/2017 0916   MONOABS 0.4 04/14/2017 0916   EOSABS 0.1 04/14/2017 0916   BASOSABS 0.0 04/14/2017 0916    CMP Latest Ref Rng & Units 04/14/2017 04/03/2017 02/24/2017  Glucose 65 - 99 mg/dL 100(H) 93 108(H)  BUN 6 - 20 mg/dL _0 Creatinine 0.61 - 1.24 mg/dL 1.09 1.16 1.02  Sodium 135 - 145 mmol/L 140 135 137  Potassium 3.5 - 5.1 mmol/L 4.0 3.6 3.8  Chloride 101 - 111 mmol/L 109 104 105  CO2 22 - 32 mmol/L _1 Calcium 8.9 - 10.3 mg/dL 9.0 8.9 9.1  Total Protein 6.5 - 8.1 g/dL 7.2 6.8 7.7  Total Bilirubin 0.3 - 1.2 mg/dL 0.6 1.0 1.0  Alkaline Phos 38 - 126 U/L 54 50 64  AST 15 - 41 U/L _2 ALT 17 - 63 U/L 16(L) 19 18     ASSESSMENT/PLAN Cancer Staging Squamous cell lung cancer, left (HCC) Staging form: Lung, AJCC 8th Edition - Clinical stage from 09/27/2016: Stage IIIA (cT2a, cN2, cM0) - Signed by Earlie Server, MD on 09/29/2016  1. Squamous cell lung cancer, left ([place])   2. Encounter for antineoplastic immunotherapy   3. Pneumonitis    #Patient's radiation pneumonitis clinically improving on steroids. Advise patient to stop steroids.  # proceed with Durvalumab today.  # Follow up in 2 weeks for next cycle of Durvaluamab , All questions were answered. The patient knows to call the clinic with any problems, questions or concerns.  Earlie Server, MD, PhD Hematology Oncology [facility] at [facility] Pager- [phone] 04/14/2017

## 2017-04-28 ENCOUNTER — Inpatient Hospital Stay: Payer: Medicare HMO | Admitting: Oncology

## 2017-04-28 ENCOUNTER — Inpatient Hospital Stay: Payer: Medicare HMO

## 2017-04-28 ENCOUNTER — Encounter: Payer: Self-pay | Admitting: Oncology

## 2017-04-28 ENCOUNTER — Other Ambulatory Visit: Payer: Self-pay

## 2017-04-28 VITALS — BP 112/70 | HR 91 | Temp 96.7°F | Wt 252.0 lb

## 2017-04-28 DIAGNOSIS — Z5111 Encounter for antineoplastic chemotherapy: Secondary | ICD-10-CM

## 2017-04-28 DIAGNOSIS — Z5112 Encounter for antineoplastic immunotherapy: Secondary | ICD-10-CM | POA: Diagnosis not present

## 2017-04-28 DIAGNOSIS — R05 Cough: Secondary | ICD-10-CM | POA: Diagnosis not present

## 2017-04-28 DIAGNOSIS — R059 Cough, unspecified: Secondary | ICD-10-CM

## 2017-04-28 DIAGNOSIS — C3492 Malignant neoplasm of unspecified part of left bronchus or lung: Secondary | ICD-10-CM

## 2017-04-28 DIAGNOSIS — C3412 Malignant neoplasm of upper lobe, left bronchus or lung: Secondary | ICD-10-CM

## 2017-04-28 DIAGNOSIS — C801 Malignant (primary) neoplasm, unspecified: Secondary | ICD-10-CM

## 2017-04-28 DIAGNOSIS — D696 Thrombocytopenia, unspecified: Secondary | ICD-10-CM

## 2017-04-28 LAB — CBC WITH DIFFERENTIAL/PLATELET
Basophils Absolute: 0 10*3/uL (ref 0–0.1)
Basophils Relative: 0 %
Eosinophils Absolute: 0.1 10*3/uL (ref 0–0.7)
Eosinophils Relative: 3 %
HEMATOCRIT: 39.6 % — AB (ref 40.0–52.0)
Hemoglobin: 13.2 g/dL (ref 13.0–18.0)
LYMPHS PCT: 12 %
Lymphs Abs: 0.5 10*3/uL — ABNORMAL LOW (ref 1.0–3.6)
MCH: 29.7 pg (ref 26.0–34.0)
MCHC: 33.4 g/dL (ref 32.0–36.0)
MCV: 89.1 fL (ref 80.0–100.0)
MONOS PCT: 10 %
Monocytes Absolute: 0.5 10*3/uL (ref 0.2–1.0)
NEUTROS ABS: 3.5 10*3/uL (ref 1.4–6.5)
Neutrophils Relative %: 75 %
Platelets: 193 10*3/uL (ref 150–440)
RBC: 4.44 MIL/uL (ref 4.40–5.90)
RDW: 13.1 % (ref 11.5–14.5)
WBC: 4.7 10*3/uL (ref 3.8–10.6)

## 2017-04-28 LAB — COMPREHENSIVE METABOLIC PANEL
ALK PHOS: 57 U/L (ref 38–126)
ALT: 16 U/L — ABNORMAL LOW (ref 17–63)
ANION GAP: 8 (ref 5–15)
AST: 21 U/L (ref 15–41)
Albumin: 3.6 g/dL (ref 3.5–5.0)
BUN: 15 mg/dL (ref 6–20)
CO2: 23 mmol/L (ref 22–32)
Calcium: 9 mg/dL (ref 8.9–10.3)
Chloride: 106 mmol/L (ref 101–111)
Creatinine, Ser: 1.09 mg/dL (ref 0.61–1.24)
GFR calc Af Amer: >60 mL/min (ref >60)
GFR calc non Af Amer: >60 mL/min (ref >60)
Glucose, Bld: 126 mg/dL — ABNORMAL HIGH (ref 65–99)
POTASSIUM: 3.8 mmol/L (ref 3.5–5.1)
SODIUM: 137 mmol/L (ref 135–145)
Total Bilirubin: 0.9 mg/dL (ref 0.3–1.2)
Total Protein: 7.2 g/dL (ref 6.5–8.1)

## 2017-04-28 LAB — TSH: TSH: 1.3 u[IU]/mL (ref 0.350–4.500)

## 2017-04-28 MED ORDER — FLUTICASONE PROPIONATE HFA 44 MCG/ACT IN AERO: 2.0000 | INHALATION_SPRAY | Freq: Two times a day (BID) | RESPIRATORY_TRACT | 12 refills | Status: AC

## 2017-04-28 MED ORDER — ALBUTEROL SULFATE HFA 108 (90 BASE) MCG/ACT IN AERS: 2.0000 | INHALATION_SPRAY | Freq: Four times a day (QID) | RESPIRATORY_TRACT | 2 refills | Status: AC | PRN

## 2017-04-28 MED ORDER — HEPARIN SOD (PORK) LOCK FLUSH 100 UNIT/ML IV SOLN
500.0000 [IU] | Freq: Once | INTRAVENOUS | Status: AC
  Administered 2017-04-28: 500 [IU] via INTRAVENOUS
  Filled 2017-04-28: qty 5

## 2017-04-28 MED ORDER — AZITHROMYCIN 250 MG PO TABS: ORAL_TABLET | ORAL | 0 refills | Status: DC

## 2017-04-28 NOTE — Progress Notes (Signed)
Patient here today for follow up.  Patient c/o cough

## 2017-04-28 NOTE — Progress Notes (Signed)
[facility] Follow Up Visit  Patient Care Team: Adline Potter, MD as PCP - General (Family Medicine) Telford Nab, RN as Registered Nurse  REASON FOR VISIT:  Follow up for evaluation prior to maintenance immunotherapy  HISTORY OF PRESENT ILLNESS/ PERTINENT ONCOLOGY HISTORY #  [name] 72 y.o. African American male previous healthy, former smoker who presents for treatment of lung cancer. He gets his usual care at [facility].  His cancer history listed as below.  He presented last month to urgent care due to coughing and hemoptysis. X ray showed left lung mass. Cough up small amount of blood mixed in the sputum. Denies SOB, chest pain, back pain, abdominal pain. Denies headache, vision changes. He works a Administrator.  # Images:  PET 09/06/2016  showed hypermetabolic left upper lobe mass as well as the adjacent nodule, ipsilateral hilar and mediastinal lymphadenopathy.   MRI brain showed no intracranial mets. CT showed LUL mass with a satellite lesion as well as mediastinal adenopathy.   CT chest w contrast 08/31/2016 1. Left peripheral upper lobe mass measuring up to 3.5 cm, likely primary lung cancer. 2. 8 mm satellite nodule just anterior to the mass. 3. Aortopulmonary window lymphadenopathy, possibly metastatic.  12/22/2016 CT chest w  3.3 cm left upper lobe mass and adjacent satellite nodule are stable in size, but show new central cavitation consistent with interval response to therapy. Decreased AP window and left hilar lymphadenopathy. No new or progressive metastatic disease.  03/08/2017 CT chest w 1. Today's study demonstrates a positive response to therapy with decreased size of left upper lobe cavitary nodule, and involving postradiation changes in the left lung, as above. 2. Mild diffuse bronchial thickening with mild centrilobular and paraseptal emphysema; imaging findings suggestive of underlying COPD. 3. Aortic atherosclerosis, in addition to three-vessel  coronary artery disease.   # Pathology: TPS 50%. 09/22/2016 left upper lobe biopsy showed squamous cell carcinoma. Foundation one test: ordered prior to patient first visit. Showed NO EGFR, ROS1, ALK, BRAF, MET mutation.   Cancer Treatment: S/p concurrent carbo/taxol and Radiation.  Currently Durvalumab Q2 weeks. started 01/27/2017, held after 5 cycles due to radiation pneumonitis. After steroid treatment, back on Durvalumab Q2weeks.   INTERVAL HISTORY Today Mr. [name] presented for follow up .  Patient reports that he has been getting intermittent cough spells, especially at night.  He wants something to help with the cough.  Cough appears to be his chronic problem, however wife feels that as being different type of cough.  No fever or chills.  Cough is only productive for clears.  Appetite is good.  No shortness of breath.  Review of Systems  Constitutional: Negative for appetite change, chills, fatigue and fever.  HENT:   Negative for lump/mass, mouth sores, nosebleeds and sore throat.        Postnasal drip  Eyes: Negative for eye problems.  Respiratory: Positive for cough. Negative for chest tightness, hemoptysis and shortness of breath.   Cardiovascular: Negative for chest pain and leg swelling.  Gastrointestinal: Negative for abdominal distention and blood in stool.  Endocrine: Negative for hot flashes.  Genitourinary: Negative for difficulty urinating, dysuria, frequency and hematuria.   Musculoskeletal: Negative for back pain, flank pain, gait problem and myalgias.  Skin: Negative for itching and rash.  Neurological: Negative for dizziness, gait problem, light-headedness and numbness.  Hematological: Negative for adenopathy. Does not bruise/bleed easily.  Psychiatric/Behavioral: Negative for confusion. The patient is not nervous/anxious.  MEDICAL HISTORY: Past Medical History:  Diagnosis Date  . Cancer ([place])    lung ca  . Enlarged prostate     SURGICAL  HISTORY: Past Surgical History:  Procedure Laterality Date  . NO PAST SURGERIES    . PORTACATH PLACEMENT Right 10/05/2016   Procedure: INSERTION PORT-A-CATH;  Surgeon: [name], MD;  Location: [facility] ORS;  Service: General;  Laterality: Right;    SOCIAL HISTORY: Social History   Socioeconomic History  . Marital status: Single    Spouse name: Not on file  . Number of children: Not on file  . Years of education: Not on file  . Highest education level: Not on file  Social Needs  . Financial resource strain: Not on file  . Food insecurity - worry: Not on file  . Food insecurity - inability: Not on file  . Transportation needs - medical: Not on file  . Transportation needs - non-medical: Not on file  Occupational History  . Not on file  Tobacco Use  . Smoking status: Former Smoker    Packs/day: 1.00    Years: 15.00    Pack years: 15.00    Types: Cigarettes    Last attempt to quit: 09/28/1992    Years since quitting: 24.5  . Smokeless tobacco: Never Used  Substance and Sexual Activity  . Alcohol use: No  . Drug use: No  . Sexual activity: Not on file  Other Topics Concern  . Not on file  Social History Narrative  . Not on file    FAMILY HISTORY Family History  Problem Relation Age of Onset  . Lymphoma Mother   . Diabetes Mother   . Lung cancer Sister   . Lung cancer Brother   . Lung cancer Brother     ALLERGIES:  has No Known Allergies.  MEDICATIONS:  Current Outpatient Medications  Medication Sig Dispense Refill  . acetaminophen (TYLENOL) 500 MG tablet Take 1,000 mg by mouth at bedtime.    . Cholecalciferol (VITAMIN D3) 2000 units capsule Take 1 capsule (2,000 Units total) by mouth daily.    . cyanocobalamin 500 MCG tablet Take 500 mcg by mouth daily.    . fluticasone (FLONASE) 50 MCG/ACT nasal spray Place 2 sprays into both nostrils daily. 16 g 6  . lidocaine-prilocaine (EMLA) cream Apply to affected area once 30 g 3  . mupirocin ointment (BACTROBAN) 2  % Apply 1 application topically 2 (two) times daily as needed. 22 g 2  . omeprazole (PRILOSEC) 20 MG capsule Take 1 capsule (20 mg total) by mouth daily. 30 capsule 1  . ondansetron (ZOFRAN) 8 MG tablet Take 1 tablet (8 mg total) by mouth 2 (two) times daily as needed for refractory nausea / vomiting. Start on day 3 after chemo. 30 tablet 1  . predniSONE (DELTASONE) 10 MG tablet Take 4 tablets (40 mg total) by mouth daily with breakfast. 120 tablet 0  . prochlorperazine (COMPAZINE) 10 MG tablet Take 1 tablet (10 mg total) by mouth every 6 (six) hours as needed (Nausea or vomiting). (Patient not taking: Reported on 04/14/2017) 30 tablet 1  . terazosin (HYTRIN) 1 MG capsule Take 1 capsule (1 mg total) by mouth at bedtime. (Patient not taking: Reported on 04/14/2017)     No current facility-administered medications for this visit.     PHYSICAL EXAMINATION:  ECOG PERFORMANCE STATUS: 1 - Symptomatic but completely ambulatory   Vitals:   04/28/17 0841  BP: 112/70  Pulse: 91  Temp: (!) 96.7 F (  35.9 C)  SpO2: 96%   Filed Weights   04/28/17 0841  Weight: 252 lb (114.3 kg)  Physical Exam  Constitutional: He is oriented to person, place, and time and well-developed, well-nourished, and in no distress. No distress.  HENT:  Head: Normocephalic and atraumatic.  Mouth/Throat: Oropharynx is clear and moist. No oropharyngeal exudate.  Eyes: Conjunctivae and EOM are normal. Pupils are equal, round, and reactive to light. Right eye exhibits no discharge. Left eye exhibits no discharge. No scleral icterus.  Neck: Normal range of motion. Neck supple. No JVD present.  Cardiovascular: Normal rate, regular rhythm and normal heart sounds.  Pulmonary/Chest: Effort normal and breath sounds normal. No respiratory distress. He has no wheezes. He exhibits no tenderness.  Abdominal: Soft. Bowel sounds are normal. He exhibits no distension. There is no tenderness. There is no guarding.  Musculoskeletal: Normal  range of motion. He exhibits no edema or tenderness.  Lymphadenopathy:    He has no cervical adenopathy.  Neurological: He is alert and oriented to person, place, and time. No cranial nerve deficit.  Skin: Skin is warm and dry. No rash noted. He is not diaphoretic.  Psychiatric: Memory and judgment normal.      LABORATORY DATA: I have personally reviewed the data as listed: CBC    Component Value Date/Time   WBC 4.9 04/14/2017 0916   RBC 4.25 (L) 04/14/2017 0916   HGB 12.9 (L) 04/14/2017 0916   HCT 38.4 (L) 04/14/2017 0916   PLT 203 04/14/2017 0916   MCV 90.3 04/14/2017 0916   MCH 30.4 04/14/2017 0916   MCHC 33.7 04/14/2017 0916   RDW 13.0 04/14/2017 0916   LYMPHSABS 0.5 (L) 04/14/2017 0916   MONOABS 0.4 04/14/2017 0916   EOSABS 0.1 04/14/2017 0916   BASOSABS 0.0 04/14/2017 0916    CMP Latest Ref Rng & Units 04/14/2017 04/03/2017 02/24/2017  Glucose 65 - 99 mg/dL 100(H) 93 108(H)  BUN 6 - 20 mg/dL _0 Creatinine 0.61 - 1.24 mg/dL 1.09 1.16 1.02  Sodium 135 - 145 mmol/L 140 135 137  Potassium 3.5 - 5.1 mmol/L 4.0 3.6 3.8  Chloride 101 - 111 mmol/L 109 104 105  CO2 22 - 32 mmol/L _1 Calcium 8.9 - 10.3 mg/dL 9.0 8.9 9.1  Total Protein 6.5 - 8.1 g/dL 7.2 6.8 7.7  Total Bilirubin 0.3 - 1.2 mg/dL 0.6 1.0 1.0  Alkaline Phos 38 - 126 U/L 54 50 64  AST 15 - 41 U/L _2 ALT 17 - 63 U/L 16(L) 19 18     ASSESSMENT/PLAN Cancer Staging Squamous cell lung cancer, left (HCC) Staging form: Lung, AJCC 8th Edition - Clinical stage from 09/27/2016: Stage IIIA (cT2a, cN2, cM0) - Signed by Earlie Server, MD on 09/29/2016  1. Squamous cell lung cancer, left ([place])   2. Encounter for antineoplastic immunotherapy   3. Cough    #Patient complains about cough again, questionable COPD/bronchitis versus flare of pneumonitis.  patient is reluctant to restart steroids.  We discussed about a course of azithromycin, albuterol inhaler as needed, and steroid inhaler twice daily.    prescription sent to pharmacy.  If no improvement, will proceed with another CAT scan. #Hold Durvalumab today.  #Follow-up in 2 weeks. All questions were answered. The patient knows to call the clinic with any problems, questions or concerns.  Earlie Server, MD, PhD Hematology Oncology [facility] Pager- [phone] 04/28/2017

## 2017-05-01 ENCOUNTER — Ambulatory Visit: Payer: Medicare HMO | Admitting: Oncology

## 2017-05-14 NOTE — Progress Notes (Signed)
Ad[facility] Cancer Follow Up Visit  Patient Care Team: Adline Potter, MD as PCP - General (Family Medicine) Telford Nab, RN as Registered Nurse  REASON FOR VISIT:  Follow up for evaluation prior to maintenance immunotherapy  HISTORY OF PRESENT ILLNESS/ PERTINENT ONCOLOGY HISTORY #  [name] 72 y.o. African American male previous healthy, former smoker who presents for treatment of lung cancer. He gets his usual care at [facility].  His cancer history listed as below.  He presented last month to urgent care due to coughing and hemoptysis. X ray showed left lung mass. Cough up small amount of blood mixed in the sputum. Denies SOB, chest pain, back pain, abdominal pain. Denies headache, vision changes. He works a Administrator.  # Images:  PET 09/06/2016  showed hypermetabolic left upper lobe mass as well as the adjacent nodule, ipsilateral hilar and mediastinal lymphadenopathy.   MRI brain showed no intracranial mets. CT showed LUL mass with a satellite lesion as well as mediastinal adenopathy.   CT chest w contrast 08/31/2016 1. Left peripheral upper lobe mass measuring up to 3.5 cm, likely primary lung cancer. 2. 8 mm satellite nodule just anterior to the mass. 3. Aortopulmonary window lymphadenopathy, possibly metastatic.  12/22/2016 CT chest w  3.3 cm left upper lobe mass and adjacent satellite nodule are stable in size, but show new central cavitation consistent with interval response to therapy. Decreased AP window and left hilar lymphadenopathy. No new or progressive metastatic disease.  03/08/2017 CT chest w 1. Today's study demonstrates a positive response to therapy with decreased size of left upper lobe cavitary nodule, and involving postradiation changes in the left lung, as above. 2. Mild diffuse bronchial thickening with mild centrilobular and paraseptal emphysema; imaging findings suggestive of underlying COPD. 3. Aortic atherosclerosis, in addition to three-vessel  coronary artery disease.   # Pathology: TPS 50%. 09/22/2016 left upper lobe biopsy showed squamous cell carcinoma. Foundation one test: ordered prior to patient first visit. Showed NO EGFR, ROS1, ALK, BRAF, MET mutation.   Cancer Treatment: S/p concurrent carbo/taxol and Radiation.  Currently Durvalumab Q2 weeks. started 01/27/2017, held after 5 cycles due to radiation pneumonitis. After steroid treatment, back on Durvalumab Q2weeks.   INTERVAL HISTORY Today [name] presented for follow up .  Patient reported having coughing spells during last visits and he was prescribed 5 days course of azithromycin and started on Flovent inhaler twice daily and albuterol inhaler as needed.  The value map was held at last visit. Today patient reports feeling much better, coughing is better, still has some.  He finished his azithromycin course.  He used his inhaler for a few days and forget to use it scheduled time. He has good appetite weight is stable.  No shortness of breath.   No shortness of breath.  Review of Systems  Constitutional: Negative for appetite change, chills, fatigue and fever.  HENT:   Negative for lump/mass, mouth sores, nosebleeds and sore throat.   Eyes: Negative for eye problems.  Respiratory: Positive for cough. Negative for chest tightness, hemoptysis and shortness of breath.   Cardiovascular: Negative for chest pain and leg swelling.  Gastrointestinal: Negative for abdominal distention and blood in stool.  Endocrine: Negative for hot flashes.  Genitourinary: Negative for difficulty urinating, discharge, dysuria and pelvic pain.   Musculoskeletal: Negative for back pain, flank pain, gait problem and myalgias.  Skin: Negative for itching and rash.  Neurological: Negative for dizziness, gait problem, light-headedness and numbness.  Hematological: Negative for adenopathy. Does not bruise/bleed easily.  Psychiatric/Behavioral: Negative for confusion. The patient is not  nervous/anxious.        MEDICAL HISTORY: Past Medical History:  Diagnosis Date  . Cancer ([place])    lung ca  . Enlarged prostate     SURGICAL HISTORY: Past Surgical History:  Procedure Laterality Date  . NO PAST SURGERIES    . PORTACATH PLACEMENT Right 10/05/2016   Procedure: INSERTION PORT-A-CATH;  Surgeon: [name], MD;  Location: [facility] ORS;  Service: General;  Laterality: Right;    SOCIAL HISTORY: Social History   Socioeconomic History  . Marital status: Single    Spouse name: Not on file  . Number of children: Not on file  . Years of education: Not on file  . Highest education level: Not on file  Occupational History  . Not on file  Social Needs  . Financial resource strain: Not on file  . Food insecurity:    Worry: Not on file    Inability: Not on file  . Transportation needs:    Medical: Not on file    Non-medical: Not on file  Tobacco Use  . Smoking status: Former Smoker    Packs/day: 1.00    Years: 15.00    Pack years: 15.00    Types: Cigarettes    Last attempt to quit: 09/28/1992    Years since quitting: 24.6  . Smokeless tobacco: Never Used  Substance and Sexual Activity  . Alcohol use: No  . Drug use: No  . Sexual activity: Not on file  Lifestyle  . Physical activity:    Days per week: Not on file    Minutes per session: Not on file  . Stress: Not on file  Relationships  . Social connections:    Talks on phone: Not on file    Gets together: Not on file    Attends religious service: Not on file    Active member of club or organization: Not on file    Attends meetings of clubs or organizations: Not on file    Relationship status: Not on file  . Intimate partner violence:    Fear of current or ex partner: Not on file    Emotionally abused: Not on file    Physically abused: Not on file    Forced sexual activity: Not on file  Other Topics Concern  . Not on file  Social History Narrative  . Not on file    FAMILY HISTORY Family  History  Problem Relation Age of Onset  . Lymphoma Mother   . Diabetes Mother   . Lung cancer Sister   . Lung cancer Brother   . Lung cancer Brother     ALLERGIES:  has No Known Allergies.  MEDICATIONS:  Current Outpatient Medications  Medication Sig Dispense Refill  . acetaminophen (TYLENOL) 500 MG tablet Take 1,000 mg by mouth at bedtime.    Marland Kitchen albuterol (PROVENTIL HFA;VENTOLIN HFA) 108 (90 Base) MCG/ACT inhaler Inhale 2 puffs into the lungs every 6 (six) hours as needed for wheezing or shortness of breath. 1 Inhaler 2  . azithromycin (ZITHROMAX) 250 MG tablet Take 567m on day 1 and then take 2556mdaily 6 each 0  . Cholecalciferol (VITAMIN D3) 2000 units capsule Take 1 capsule (2,000 Units total) by mouth daily.    . cyanocobalamin 500 MCG tablet Take 500 mcg by mouth daily.    . fluticasone (FLONASE) 50 MCG/ACT nasal spray Place 2 sprays into both nostrils  daily. 16 g 6  . fluticasone (FLOVENT HFA) 44 MCG/ACT inhaler Inhale 2 puffs into the lungs 2 (two) times daily. 1 Inhaler 12  . lidocaine-prilocaine (EMLA) cream Apply to affected area once 30 g 3  . mupirocin ointment (BACTROBAN) 2 % Apply 1 application topically 2 (two) times daily as needed. 22 g 2  . omeprazole (PRILOSEC) 20 MG capsule Take 1 capsule (20 mg total) by mouth daily. 30 capsule 1  . ondansetron (ZOFRAN) 8 MG tablet Take 1 tablet (8 mg total) by mouth 2 (two) times daily as needed for refractory nausea / vomiting. Start on day 3 after chemo. 30 tablet 1  . prochlorperazine (COMPAZINE) 10 MG tablet Take 1 tablet (10 mg total) by mouth every 6 (six) hours as needed (Nausea or vomiting). 30 tablet 1  . terazosin (HYTRIN) 1 MG capsule Take 1 capsule (1 mg total) by mouth at bedtime.     No current facility-administered medications for this visit.     PHYSICAL EXAMINATION:  ECOG PERFORMANCE STATUS: 1 - Symptomatic but completely ambulatory   Vitals:   05/15/17 0834  BP: 139/76  Pulse: 78  Temp: (!) 95 F (35  C)  SpO2: 98%   Filed Weights   05/15/17 0834  Weight: 253 lb 8 oz (115 kg)  Physical Exam  Constitutional: He is oriented to person, place, and time and well-developed, well-nourished, and in no distress. No distress.  HENT:  Head: Normocephalic and atraumatic.  Mouth/Throat: Oropharynx is clear and moist. No oropharyngeal exudate.  Eyes: Pupils are equal, round, and reactive to light. Conjunctivae and EOM are normal. Right eye exhibits no discharge. Left eye exhibits no discharge. No scleral icterus.  Neck: Normal range of motion. Neck supple. No JVD present.  Cardiovascular: Normal rate, regular rhythm and normal heart sounds.  No murmur heard. Pulmonary/Chest: Effort normal and breath sounds normal. No respiratory distress. He has no wheezes. He exhibits no tenderness.  Abdominal: Soft. He exhibits no distension. There is no tenderness. There is no guarding.  Musculoskeletal: Normal range of motion. He exhibits no edema or tenderness.  Lymphadenopathy:    He has no cervical adenopathy.  Neurological: He is alert and oriented to person, place, and time. No cranial nerve deficit.  Skin: Skin is warm and dry. No rash noted. He is not diaphoretic.  Psychiatric: Affect and judgment normal.      LABORATORY DATA: I have personally reviewed the data as listed: CBC    Component Value Date/Time   WBC 4.5 05/15/2017 0820   RBC 4.37 (L) 05/15/2017 0820   HGB 13.1 05/15/2017 0820   HCT 38.5 (L) 05/15/2017 0820   PLT 175 05/15/2017 0820   MCV 88.1 05/15/2017 0820   MCH 30.0 05/15/2017 0820   MCHC 34.1 05/15/2017 0820   RDW 13.5 05/15/2017 0820   LYMPHSABS 0.7 (L) 05/15/2017 0820   MONOABS 0.4 05/15/2017 0820   EOSABS 0.2 05/15/2017 0820   BASOSABS 0.0 05/15/2017 0820    CMP Latest Ref Rng & Units 05/15/2017 04/28/2017 04/14/2017  Glucose 65 - 99 mg/dL 120(H) 126(H) 100(H)  BUN 6 - 20 mg/dL '14 15 17  ' Creatinine 0.61 - 1.24 mg/dL 1.08 1.09 1.09  Sodium 135 - 145 mmol/L 137 137 140   Potassium 3.5 - 5.1 mmol/L 3.8 3.8 4.0  Chloride 101 - 111 mmol/L 108 106 109  CO2 22 - 32 mmol/L '22 23 22  ' Calcium 8.9 - 10.3 mg/dL 8.9 9.0 9.0  Total Protein 6.5 - 8.1  g/dL 7.1 7.2 7.2  Total Bilirubin 0.3 - 1.2 mg/dL 0.6 0.9 0.6  Alkaline Phos 38 - 126 U/L 58 57 54  AST 15 - 41 U/L '22 21 18  ' ALT 17 - 63 U/L 16(L) 16(L) 16(L)     ASSESSMENT/PLAN Cancer Staging Squamous cell lung cancer, left (HCC) Staging form: Lung, AJCC 8th Edition - Clinical stage from 09/27/2016: Stage IIIA (cT2a, cN2, cM0) - Signed by Earlie Server, MD on 09/29/2016  1. Squamous cell lung cancer, left ([place])   2. Encounter for antineoplastic immunotherapy   3. Cough    # Proceed with Durvalumab today.  # Cough: improved. Advice patient to continue using Flovent Q12h scheduled and Albuterol as needed.  #Follow-up in 2 weeks. Patient prefers to receive treatments on Fridays which works out better with his working schedule. Will schedule next treatment on 4/19. Plan CT scan in early May.   All questions were answered. The patient knows to call the clinic with any problems, questions or concerns.  Earlie Server, MD, PhD Hematology Oncology [facility] at [facility] Pager- [phone] 05/15/2017

## 2017-05-15 ENCOUNTER — Encounter: Payer: Self-pay | Admitting: Oncology

## 2017-05-15 ENCOUNTER — Inpatient Hospital Stay (HOSPITAL_BASED_OUTPATIENT_CLINIC_OR_DEPARTMENT_OTHER): Payer: Medicare HMO | Admitting: Oncology

## 2017-05-15 ENCOUNTER — Inpatient Hospital Stay: Payer: Medicare HMO

## 2017-05-15 ENCOUNTER — Other Ambulatory Visit: Payer: Self-pay

## 2017-05-15 ENCOUNTER — Inpatient Hospital Stay: Payer: Medicare HMO | Attending: Oncology

## 2017-05-15 VITALS — BP 139/76 | HR 78 | Temp 95.0°F | Wt 253.5 lb

## 2017-05-15 DIAGNOSIS — Z801 Family history of malignant neoplasm of trachea, bronchus and lung: Secondary | ICD-10-CM

## 2017-05-15 DIAGNOSIS — Z87891 Personal history of nicotine dependence: Secondary | ICD-10-CM | POA: Diagnosis not present

## 2017-05-15 DIAGNOSIS — C3492 Malignant neoplasm of unspecified part of left bronchus or lung: Secondary | ICD-10-CM

## 2017-05-15 DIAGNOSIS — R05 Cough: Secondary | ICD-10-CM

## 2017-05-15 DIAGNOSIS — Z79899 Other long term (current) drug therapy: Secondary | ICD-10-CM

## 2017-05-15 DIAGNOSIS — C3412 Malignant neoplasm of upper lobe, left bronchus or lung: Secondary | ICD-10-CM | POA: Diagnosis not present

## 2017-05-15 DIAGNOSIS — Z5112 Encounter for antineoplastic immunotherapy: Secondary | ICD-10-CM | POA: Insufficient documentation

## 2017-05-15 DIAGNOSIS — R059 Cough, unspecified: Secondary | ICD-10-CM

## 2017-05-15 LAB — CBC WITH DIFFERENTIAL/PLATELET
BASOS PCT: 1 %
Basophils Absolute: 0 10*3/uL (ref 0–0.1)
EOS ABS: 0.2 10*3/uL (ref 0–0.7)
Eosinophils Relative: 4 %
HCT: 38.5 % — ABNORMAL LOW (ref 40.0–52.0)
HEMOGLOBIN: 13.1 g/dL (ref 13.0–18.0)
Lymphocytes Relative: 15 %
Lymphs Abs: 0.7 10*3/uL — ABNORMAL LOW (ref 1.0–3.6)
MCH: 30 pg (ref 26.0–34.0)
MCHC: 34.1 g/dL (ref 32.0–36.0)
MCV: 88.1 fL (ref 80.0–100.0)
Monocytes Absolute: 0.4 10*3/uL (ref 0.2–1.0)
Monocytes Relative: 10 %
NEUTROS ABS: 3.2 10*3/uL (ref 1.4–6.5)
NEUTROS PCT: 70 %
Platelets: 175 10*3/uL (ref 150–440)
RBC: 4.37 MIL/uL — AB (ref 4.40–5.90)
RDW: 13.5 % (ref 11.5–14.5)
WBC: 4.5 10*3/uL (ref 3.8–10.6)

## 2017-05-15 LAB — COMPREHENSIVE METABOLIC PANEL
ALK PHOS: 58 U/L (ref 38–126)
ALT: 16 U/L — AB (ref 17–63)
ANION GAP: 7 (ref 5–15)
AST: 22 U/L (ref 15–41)
Albumin: 3.7 g/dL (ref 3.5–5.0)
BUN: 14 mg/dL (ref 6–20)
CALCIUM: 8.9 mg/dL (ref 8.9–10.3)
CO2: 22 mmol/L (ref 22–32)
CREATININE: 1.08 mg/dL (ref 0.61–1.24)
Chloride: 108 mmol/L (ref 101–111)
Glucose, Bld: 120 mg/dL — ABNORMAL HIGH (ref 65–99)
Potassium: 3.8 mmol/L (ref 3.5–5.1)
Sodium: 137 mmol/L (ref 135–145)
TOTAL PROTEIN: 7.1 g/dL (ref 6.5–8.1)
Total Bilirubin: 0.6 mg/dL (ref 0.3–1.2)

## 2017-05-15 MED ORDER — OMEPRAZOLE 20 MG PO CPDR: 20.0000 mg | DELAYED_RELEASE_CAPSULE | Freq: Every day | ORAL | 5 refills | Status: DC

## 2017-05-15 MED ORDER — HEPARIN SOD (PORK) LOCK FLUSH 100 UNIT/ML IV SOLN
500.0000 [IU] | Freq: Once | INTRAVENOUS | Status: AC
  Administered 2017-05-15: 500 [IU] via INTRAVENOUS
  Filled 2017-05-15: qty 5

## 2017-05-15 MED ORDER — SODIUM CHLORIDE 0.9 % IV SOLN
10.0000 mg/kg | Freq: Once | INTRAVENOUS | Status: AC
  Administered 2017-05-15: 1120 mg via INTRAVENOUS
  Filled 2017-05-15: qty 20

## 2017-05-15 MED ORDER — SODIUM CHLORIDE 0.9 % IV SOLN
Freq: Once | INTRAVENOUS | Status: AC
  Administered 2017-05-15: 09:00:00 via INTRAVENOUS
  Filled 2017-05-15: qty 1000

## 2017-05-15 MED ORDER — SODIUM CHLORIDE 0.9% FLUSH
10.0000 mL | INTRAVENOUS | Status: DC | PRN
  Administered 2017-05-15: 10 mL via INTRAVENOUS
  Filled 2017-05-15: qty 10

## 2017-05-15 NOTE — Progress Notes (Signed)
Patient here today for follow up.  Patient states no new concerns today  

## 2017-05-19 ENCOUNTER — Ambulatory Visit: Payer: Medicare HMO | Admitting: Radiation Oncology

## 2017-05-26 ENCOUNTER — Encounter: Payer: Self-pay | Admitting: Radiation Oncology

## 2017-05-26 ENCOUNTER — Other Ambulatory Visit: Payer: Self-pay

## 2017-05-26 ENCOUNTER — Ambulatory Visit
Admission: RE | Admit: 2017-05-26 | Discharge: 2017-05-26 | Disposition: A | Discharge status: Home / Self Care | Payer: Medicare HMO | Source: Ambulatory Visit | Attending: Radiation Oncology | Admitting: Radiation Oncology

## 2017-05-26 VITALS — BP 143/79 | HR 85 | Temp 94.6°F | Resp 20 | Wt 252.5 lb

## 2017-05-26 DIAGNOSIS — C3412 Malignant neoplasm of upper lobe, left bronchus or lung: Secondary | ICD-10-CM

## 2017-05-26 DIAGNOSIS — C3492 Malignant neoplasm of unspecified part of left bronchus or lung: Secondary | ICD-10-CM | POA: Insufficient documentation

## 2017-05-26 DIAGNOSIS — Z923 Personal history of irradiation: Secondary | ICD-10-CM | POA: Insufficient documentation

## 2017-05-26 DIAGNOSIS — Z9221 Personal history of antineoplastic chemotherapy: Secondary | ICD-10-CM | POA: Insufficient documentation

## 2017-05-26 NOTE — Progress Notes (Signed)
Radiation Oncology Follow up Note  Name: [name]   Date:   05/26/2017 MRN:  [id number] DOB: [birth year]    This 72 y.o. male presents to the clinic today for 5 month follow-up status post concurrent chemoradiation therapy for stage IIIa squamous cell carcinoma of the left lung.  REFERRING PROVIDER: Adline Potter, MD  HPI: patient is a 72 year old male now out 5 months having completed concurrent chemoradiation to his left lung for stage IIIa (T2 N2 M0) squamous cell carcinoma left lung. He is seen today in routine follow-up is doing well. He is currently on immunotherapy with.Durvalumab. He is tolerating immunotherapy well. He had a recent CT scan showing excellent response in the left lung. He specifically denies cough hemoptysis or chest tightness.    COMPLICATIONS OF TREATMENT: none  FOLLOW UP COMPLIANCE: keeps appointments   PHYSICAL EXAM:  BP (!) 143/79   Pulse 85   Temp (!) 94.6 F (34.8 C)   Resp 20   Wt 252 lb 8.6 oz (114.5 kg)   BMI 35.22 kg/m  Well-developed well-nourished patient in NAD. HEENT reveals PERLA, EOMI, discs not visualized.  Oral cavity is clear. No oral mucosal lesions are identified. Neck is clear without evidence of cervical or supraclavicular adenopathy. Lungs are clear to A&P. Cardiac examination is essentially unremarkable with regular rate and rhythm without murmur rub or thrill. Abdomen is benign with no organomegaly or masses noted. Motor sensory and DTR levels are equal and symmetric in the upper and lower extremities. Cranial nerves II through XII are grossly intact. Proprioception is intact. No peripheral adenopathy or edema is identified. No motor or sensory levels are noted. Crude visual fields are within normal range.  RADIOLOGY RESULTS: CT scan reviewed and compatible with the above-stated findings  PLAN: present time patient is doing well. He continues on immunotherapy under the direction of medical oncology. I have asked to see him back  in 6 months for follow-up. Patient knows to call anytime with any concerns.  I would like to take this opportunity to thank you for allowing me to participate in the care of your patient.Noreene Filbert, MD

## 2017-06-02 ENCOUNTER — Inpatient Hospital Stay: Payer: Medicare HMO

## 2017-06-02 ENCOUNTER — Encounter: Payer: Self-pay | Admitting: Oncology

## 2017-06-02 ENCOUNTER — Inpatient Hospital Stay: Payer: Medicare HMO | Admitting: Oncology

## 2017-06-02 VITALS — BP 120/80 | HR 93 | Temp 97.0°F | Resp 18 | Ht 71.0 in | Wt 253.5 lb

## 2017-06-02 DIAGNOSIS — Z5112 Encounter for antineoplastic immunotherapy: Secondary | ICD-10-CM | POA: Diagnosis not present

## 2017-06-02 DIAGNOSIS — C3492 Malignant neoplasm of unspecified part of left bronchus or lung: Secondary | ICD-10-CM

## 2017-06-02 DIAGNOSIS — R059 Cough, unspecified: Secondary | ICD-10-CM

## 2017-06-02 DIAGNOSIS — C3412 Malignant neoplasm of upper lobe, left bronchus or lung: Secondary | ICD-10-CM

## 2017-06-02 DIAGNOSIS — Z87891 Personal history of nicotine dependence: Secondary | ICD-10-CM | POA: Diagnosis not present

## 2017-06-02 DIAGNOSIS — Z79899 Other long term (current) drug therapy: Secondary | ICD-10-CM

## 2017-06-02 DIAGNOSIS — R05 Cough: Secondary | ICD-10-CM

## 2017-06-02 DIAGNOSIS — Z801 Family history of malignant neoplasm of trachea, bronchus and lung: Secondary | ICD-10-CM

## 2017-06-02 LAB — CBC WITH DIFFERENTIAL/PLATELET
Basophils Absolute: 0 10*3/uL (ref 0–0.1)
Basophils Relative: 1 %
EOS ABS: 0.1 10*3/uL (ref 0–0.7)
Eosinophils Relative: 3 %
HCT: 39.2 % — ABNORMAL LOW (ref 40.0–52.0)
HEMOGLOBIN: 12.9 g/dL — AB (ref 13.0–18.0)
Lymphocytes Relative: 15 %
Lymphs Abs: 0.7 10*3/uL — ABNORMAL LOW (ref 1.0–3.6)
MCH: 28.8 pg (ref 26.0–34.0)
MCHC: 33 g/dL (ref 32.0–36.0)
MCV: 87.4 fL (ref 80.0–100.0)
Monocytes Absolute: 0.4 10*3/uL (ref 0.2–1.0)
Monocytes Relative: 10 %
NEUTROS PCT: 71 %
Neutro Abs: 3.2 10*3/uL (ref 1.4–6.5)
Platelets: 171 10*3/uL (ref 150–440)
RBC: 4.49 MIL/uL (ref 4.40–5.90)
RDW: 14.1 % (ref 11.5–14.5)
WBC: 4.4 10*3/uL (ref 3.8–10.6)

## 2017-06-02 LAB — COMPREHENSIVE METABOLIC PANEL
ALK PHOS: 58 U/L (ref 38–126)
ALT: 19 U/L (ref 17–63)
AST: 26 U/L (ref 15–41)
Albumin: 3.8 g/dL (ref 3.5–5.0)
Anion gap: 7 (ref 5–15)
BILIRUBIN TOTAL: 1 mg/dL (ref 0.3–1.2)
BUN: 17 mg/dL (ref 6–20)
CO2: 22 mmol/L (ref 22–32)
CREATININE: 1.08 mg/dL (ref 0.61–1.24)
Calcium: 9.2 mg/dL (ref 8.9–10.3)
Chloride: 110 mmol/L (ref 101–111)
Glucose, Bld: 113 mg/dL — ABNORMAL HIGH (ref 65–99)
Potassium: 4 mmol/L (ref 3.5–5.1)
Sodium: 139 mmol/L (ref 135–145)
TOTAL PROTEIN: 7 g/dL (ref 6.5–8.1)

## 2017-06-02 LAB — TSH: TSH: 1.815 u[IU]/mL (ref 0.350–4.500)

## 2017-06-02 MED ORDER — HEPARIN SOD (PORK) LOCK FLUSH 100 UNIT/ML IV SOLN
500.0000 [IU] | Freq: Once | INTRAVENOUS | Status: AC | PRN
  Administered 2017-06-02: 500 [IU]
  Filled 2017-06-02: qty 5

## 2017-06-02 MED ORDER — SODIUM CHLORIDE 0.9 % IV SOLN
Freq: Once | INTRAVENOUS | Status: AC
  Administered 2017-06-02: 09:00:00 via INTRAVENOUS
  Filled 2017-06-02: qty 1000

## 2017-06-02 MED ORDER — SODIUM CHLORIDE 0.9 % IV SOLN
10.0000 mg/kg | Freq: Once | INTRAVENOUS | Status: AC
  Administered 2017-06-02: 1120 mg via INTRAVENOUS
  Filled 2017-06-02: qty 20

## 2017-06-02 NOTE — Progress Notes (Signed)
[place] Cancer Follow Up Visit  Patient Care Team: Adline Potter, MD as PCP - General (Family Medicine) Telford Nab, RN as Registered Nurse  REASON FOR VISIT:  Follow up for evaluation prior to maintenance immunotherapy  HISTORY OF PRESENT ILLNESS/ PERTINENT ONCOLOGY HISTORY #  [name] 72 y.o. African American male previous healthy, former smoker who presents for treatment of lung cancer. He gets his usual care at [facility].  His cancer history listed as below.  He presented last month to urgent care due to coughing and hemoptysis. X ray showed left lung mass. Cough up small amount of blood mixed in the sputum. Denies SOB, chest pain, back pain, abdominal pain. Denies headache, vision changes. He works a Administrator.  # Images:  PET 09/06/2016  showed hypermetabolic left upper lobe mass as well as the adjacent nodule, ipsilateral hilar and mediastinal lymphadenopathy.   MRI brain showed no intracranial mets. CT showed LUL mass with a satellite lesion as well as mediastinal adenopathy.   CT chest w contrast 08/31/2016 1. Left peripheral upper lobe mass measuring up to 3.5 cm, likely primary lung cancer. 2. 8 mm satellite nodule just anterior to the mass. 3. Aortopulmonary window lymphadenopathy, possibly metastatic.  12/22/2016 CT chest w  3.3 cm left upper lobe mass and adjacent satellite nodule are stable in size, but show new central cavitation consistent with interval response to therapy. Decreased AP window and left hilar lymphadenopathy. No new or progressive metastatic disease.  03/08/2017 CT chest w 1. Today's study demonstrates a positive response to therapy with decreased size of left upper lobe cavitary nodule, and involving postradiation changes in the left lung, as above. 2. Mild diffuse bronchial thickening with mild centrilobular and paraseptal emphysema; imaging findings suggestive of underlying COPD. 3. Aortic atherosclerosis, in addition to three-vessel  coronary artery disease.   # Pathology: TPS 50%. 09/22/2016 left upper lobe biopsy showed squamous cell carcinoma. Foundation one test: ordered prior to patient first visit. Showed NO EGFR, ROS1, ALK, BRAF, MET mutation.   Cancer Treatment: S/p concurrent carbo/taxol and Radiation.  Currently Durvalumab Q2 weeks. started 01/27/2017, held after 5 cycles due to radiation pneumonitis. After steroid treatment, back on Durvalumab Q2weeks.   INTERVAL HISTORY Today [name] presented for follow up .  Cough is "off and on". Uses inhaler PRN.  He has good appetite weight is stable. Denies SOB, chest pain, fatigue. He continues to work.   Review of Systems  Constitutional: Negative for appetite change, chills, fatigue and fever.  HENT:   Negative for hearing loss, lump/mass, mouth sores, nosebleeds and sore throat.   Eyes: Negative for eye problems.  Respiratory: Positive for cough. Negative for chest tightness, hemoptysis and shortness of breath.   Cardiovascular: Negative for chest pain and leg swelling.  Gastrointestinal: Negative for abdominal distention, abdominal pain and blood in stool.  Endocrine: Negative for hot flashes.  Genitourinary: Negative for difficulty urinating, discharge, dysuria and pelvic pain.   Musculoskeletal: Negative for back pain, flank pain, gait problem, myalgias and neck pain.  Skin: Negative for itching, rash and wound.  Neurological: Negative for dizziness, gait problem, light-headedness, numbness and seizures.  Hematological: Negative for adenopathy. Does not bruise/bleed easily.  Psychiatric/Behavioral: Negative for confusion and decreased concentration. The patient is not nervous/anxious.        MEDICAL HISTORY: Past Medical History:  Diagnosis Date  . Cancer ([place])    lung ca  . Enlarged prostate     SURGICAL HISTORY: Past  Surgical History:  Procedure Laterality Date  . NO PAST SURGERIES    . PORTACATH PLACEMENT Right 10/05/2016   Procedure:  INSERTION PORT-A-CATH;  Surgeon: [name], MD;  Location: [facility] ORS;  Service: General;  Laterality: Right;    SOCIAL HISTORY: Social History   Socioeconomic History  . Marital status: Single    Spouse name: Not on file  . Number of children: Not on file  . Years of education: Not on file  . Highest education level: Not on file  Occupational History  . Not on file  Social Needs  . Financial resource strain: Not on file  . Food insecurity:    Worry: Not on file    Inability: Not on file  . Transportation needs:    Medical: Not on file    Non-medical: Not on file  Tobacco Use  . Smoking status: Former Smoker    Packs/day: 1.00    Years: 15.00    Pack years: 15.00    Types: Cigarettes    Last attempt to quit: 09/28/1992    Years since quitting: 24.6  . Smokeless tobacco: Never Used  Substance and Sexual Activity  . Alcohol use: No  . Drug use: No  . Sexual activity: Not on file  Lifestyle  . Physical activity:    Days per week: Not on file    Minutes per session: Not on file  . Stress: Not on file  Relationships  . Social connections:    Talks on phone: Not on file    Gets together: Not on file    Attends religious service: Not on file    Active member of club or organization: Not on file    Attends meetings of clubs or organizations: Not on file    Relationship status: Not on file  . Intimate partner violence:    Fear of current or ex partner: Not on file    Emotionally abused: Not on file    Physically abused: Not on file    Forced sexual activity: Not on file  Other Topics Concern  . Not on file  Social History Narrative  . Not on file    FAMILY HISTORY Family History  Problem Relation Age of Onset  . Lymphoma Mother   . Diabetes Mother   . Lung cancer Sister   . Lung cancer Brother   . Lung cancer Brother     ALLERGIES:  has No Known Allergies.  MEDICATIONS:  Current Outpatient Medications  Medication Sig Dispense Refill  . albuterol  (PROVENTIL HFA;VENTOLIN HFA) 108 (90 Base) MCG/ACT inhaler Inhale 2 puffs into the lungs every 6 (six) hours as needed for wheezing or shortness of breath. 1 Inhaler 2  . Cholecalciferol (VITAMIN D3) 2000 units capsule Take 1 capsule (2,000 Units total) by mouth daily.    . cyanocobalamin 500 MCG tablet Take 500 mcg by mouth daily.    . fluticasone (FLONASE) 50 MCG/ACT nasal spray Place 2 sprays into both nostrils daily. 16 g 6  . fluticasone (FLOVENT HFA) 44 MCG/ACT inhaler Inhale 2 puffs into the lungs 2 (two) times daily. 1 Inhaler 12  . lidocaine-prilocaine (EMLA) cream Apply to affected area once 30 g 3  . mupirocin ointment (BACTROBAN) 2 % Apply 1 application topically 2 (two) times daily as needed. 22 g 2  . omeprazole (PRILOSEC) 20 MG capsule Take 1 capsule (20 mg total) by mouth daily. 30 capsule 5  . ondansetron (ZOFRAN) 8 MG tablet Take 1 tablet (8 mg  total) by mouth 2 (two) times daily as needed for refractory nausea / vomiting. Start on day 3 after chemo. 30 tablet 1  . prochlorperazine (COMPAZINE) 10 MG tablet Take 1 tablet (10 mg total) by mouth every 6 (six) hours as needed (Nausea or vomiting). 30 tablet 1  . terazosin (HYTRIN) 1 MG capsule Take 1 capsule (1 mg total) by mouth at bedtime.    Marland Kitchen acetaminophen (TYLENOL) 500 MG tablet Take 1,000 mg by mouth at bedtime.     No current facility-administered medications for this visit.     PHYSICAL EXAMINATION:  ECOG PERFORMANCE STATUS: 1 - Symptomatic but completely ambulatory   Vitals:   06/02/17 0825  BP: 120/80  Pulse: 93  Resp: 18  Temp: (!) 97 F (36.1 C)  SpO2: 99%   Filed Weights   06/02/17 0825  Weight: 253 lb 8 oz (115 kg)  Physical Exam  Constitutional: He is oriented to person, place, and time and well-developed, well-nourished, and in no distress. No distress.  HENT:  Head: Normocephalic and atraumatic.  Right Ear: External ear normal.  Left Ear: External ear normal.  Mouth/Throat: Oropharynx is clear and  moist. No oropharyngeal exudate.  Eyes: Pupils are equal, round, and reactive to light. Conjunctivae and EOM are normal. Right eye exhibits no discharge. Left eye exhibits no discharge. No scleral icterus.  Neck: Normal range of motion. Neck supple. No JVD present.  Cardiovascular: Normal rate, regular rhythm and normal heart sounds. Exam reveals no friction rub.  No murmur heard. Pulmonary/Chest: Effort normal and breath sounds normal. No respiratory distress. He has no wheezes. He exhibits no tenderness.  Abdominal: Soft. He exhibits no distension. There is no tenderness. There is no guarding.  Musculoskeletal: Normal range of motion. He exhibits no edema, tenderness or deformity.  Lymphadenopathy:    He has no cervical adenopathy.  Neurological: He is alert and oriented to person, place, and time. No cranial nerve deficit. Coordination normal.  Skin: Skin is warm and dry. No rash noted. He is not diaphoretic. No erythema. No pallor.  Psychiatric: Mood, memory, affect and judgment normal.      LABORATORY DATA: I have personally reviewed the data as listed: CBC    Component Value Date/Time   WBC 4.4 06/02/2017 0752   RBC 4.49 06/02/2017 0752   HGB 12.9 (L) 06/02/2017 0752   HCT 39.2 (L) 06/02/2017 0752   PLT 171 06/02/2017 0752   MCV 87.4 06/02/2017 0752   MCH 28.8 06/02/2017 0752   MCHC 33.0 06/02/2017 0752   RDW 14.1 06/02/2017 0752   LYMPHSABS 0.7 (L) 06/02/2017 0752   MONOABS 0.4 06/02/2017 0752   EOSABS 0.1 06/02/2017 0752   BASOSABS 0.0 06/02/2017 0752    CMP Latest Ref Rng & Units 06/02/2017 05/15/2017 04/28/2017  Glucose 65 - 99 mg/dL 113(H) 120(H) 126(H)  BUN 6 - 20 mg/dL '17 14 15  ' Creatinine 0.61 - 1.24 mg/dL 1.08 1.08 1.09  Sodium 135 - 145 mmol/L 139 137 137  Potassium 3.5 - 5.1 mmol/L 4.0 3.8 3.8  Chloride 101 - 111 mmol/L 110 108 106  CO2 22 - 32 mmol/L '22 22 23  ' Calcium 8.9 - 10.3 mg/dL 9.2 8.9 9.0  Total Protein 6.5 - 8.1 g/dL 7.0 7.1 7.2  Total Bilirubin  0.3 - 1.2 mg/dL 1.0 0.6 0.9  Alkaline Phos 38 - 126 U/L 58 58 57  AST 15 - 41 U/L '26 22 21  ' ALT 17 - 63 U/L 19 16(L) 16(L)  ASSESSMENT/PLAN Cancer Staging Squamous cell lung cancer, left [facility]) Staging form: Lung, AJCC 8th Edition - Clinical stage from 09/27/2016: Stage IIIA (cT2a, cN2, cM0) - Signed by Earlie Server, MD on 09/29/2016  1. Squamous cell lung cancer, left ([place])   2. Encounter for antineoplastic immunotherapy   3. Cough    # Proceed with Durvalumab today.  # Cough: continue using Flovent Q12h scheduled and Albuterol as needed.  Plan CT chest w contrast prior to next cycle of immunotherapy.   #Follow-up in 2 weeks.  All questions were answered. The patient knows to call the clinic with any problems, questions or concerns.  Earlie Server, MD, PhD Hematology Oncology [facility] at [facility] Pager- [phone] 06/02/2017

## 2017-06-02 NOTE — Progress Notes (Signed)
No new changes noted today 

## 2017-06-09 ENCOUNTER — Ambulatory Visit
Admission: RE | Admit: 2017-06-09 | Discharge: 2017-06-09 | Disposition: A | Discharge status: Home / Self Care | Payer: Medicare HMO | Source: Ambulatory Visit | Attending: Oncology | Admitting: Oncology

## 2017-06-09 DIAGNOSIS — Z923 Personal history of irradiation: Secondary | ICD-10-CM | POA: Insufficient documentation

## 2017-06-09 DIAGNOSIS — I700 Atherosclerosis of aorta: Secondary | ICD-10-CM | POA: Diagnosis not present

## 2017-06-09 DIAGNOSIS — J439 Emphysema, unspecified: Secondary | ICD-10-CM | POA: Diagnosis not present

## 2017-06-09 DIAGNOSIS — C3492 Malignant neoplasm of unspecified part of left bronchus or lung: Secondary | ICD-10-CM | POA: Insufficient documentation

## 2017-06-09 HISTORY — DX: Malignant neoplasm of unspecified part of left bronchus or lung: C34.92

## 2017-06-09 MED ORDER — IOHEXOL 300 MG/ML  SOLN
75.0000 mL | Freq: Once | INTRAMUSCULAR | Status: AC | PRN
  Administered 2017-06-09: 75 mL via INTRAVENOUS

## 2017-06-16 ENCOUNTER — Inpatient Hospital Stay: Payer: Medicare HMO | Attending: Oncology | Admitting: Oncology

## 2017-06-16 ENCOUNTER — Other Ambulatory Visit: Payer: Self-pay

## 2017-06-16 ENCOUNTER — Inpatient Hospital Stay: Payer: Medicare HMO

## 2017-06-16 ENCOUNTER — Encounter: Payer: Self-pay | Admitting: Oncology

## 2017-06-16 VITALS — BP 132/77 | HR 72 | Temp 95.9°F | Resp 16 | Wt 252.0 lb

## 2017-06-16 DIAGNOSIS — Z5112 Encounter for antineoplastic immunotherapy: Secondary | ICD-10-CM

## 2017-06-16 DIAGNOSIS — R634 Abnormal weight loss: Secondary | ICD-10-CM | POA: Insufficient documentation

## 2017-06-16 DIAGNOSIS — J700 Acute pulmonary manifestations due to radiation: Secondary | ICD-10-CM | POA: Diagnosis not present

## 2017-06-16 DIAGNOSIS — Z801 Family history of malignant neoplasm of trachea, bronchus and lung: Secondary | ICD-10-CM | POA: Diagnosis not present

## 2017-06-16 DIAGNOSIS — R05 Cough: Secondary | ICD-10-CM | POA: Insufficient documentation

## 2017-06-16 DIAGNOSIS — C3412 Malignant neoplasm of upper lobe, left bronchus or lung: Secondary | ICD-10-CM | POA: Diagnosis not present

## 2017-06-16 DIAGNOSIS — Z79899 Other long term (current) drug therapy: Secondary | ICD-10-CM | POA: Diagnosis not present

## 2017-06-16 DIAGNOSIS — R530 Neoplastic (malignant) related fatigue: Secondary | ICD-10-CM | POA: Diagnosis not present

## 2017-06-16 DIAGNOSIS — C3492 Malignant neoplasm of unspecified part of left bronchus or lung: Secondary | ICD-10-CM

## 2017-06-16 DIAGNOSIS — Z87891 Personal history of nicotine dependence: Secondary | ICD-10-CM | POA: Insufficient documentation

## 2017-06-16 DIAGNOSIS — R059 Cough, unspecified: Secondary | ICD-10-CM

## 2017-06-16 LAB — CBC WITH DIFFERENTIAL/PLATELET
Basophils Absolute: 0 10*3/uL (ref 0–0.1)
Basophils Relative: 0 %
Eosinophils Absolute: 0.1 10*3/uL (ref 0–0.7)
Eosinophils Relative: 3 %
HCT: 39.7 % — ABNORMAL LOW (ref 40.0–52.0)
Hemoglobin: 13.3 g/dL (ref 13.0–18.0)
LYMPHS ABS: 0.6 10*3/uL — AB (ref 1.0–3.6)
Lymphocytes Relative: 14 %
MCH: 29.5 pg (ref 26.0–34.0)
MCHC: 33.6 g/dL (ref 32.0–36.0)
MCV: 87.9 fL (ref 80.0–100.0)
MONO ABS: 0.4 10*3/uL (ref 0.2–1.0)
MONOS PCT: 9 %
NEUTROS PCT: 74 %
Neutro Abs: 3.2 10*3/uL (ref 1.4–6.5)
Platelets: 173 10*3/uL (ref 150–440)
RBC: 4.52 MIL/uL (ref 4.40–5.90)
RDW: 14.5 % (ref 11.5–14.5)
WBC: 4.4 10*3/uL (ref 3.8–10.6)

## 2017-06-16 LAB — COMPREHENSIVE METABOLIC PANEL
ALK PHOS: 69 U/L (ref 38–126)
ALT: 19 U/L (ref 17–63)
ANION GAP: 6 (ref 5–15)
AST: 24 U/L (ref 15–41)
Albumin: 3.9 g/dL (ref 3.5–5.0)
BILIRUBIN TOTAL: 1.2 mg/dL (ref 0.3–1.2)
BUN: 19 mg/dL (ref 6–20)
CALCIUM: 9.1 mg/dL (ref 8.9–10.3)
CO2: 24 mmol/L (ref 22–32)
CREATININE: 1.06 mg/dL (ref 0.61–1.24)
Chloride: 109 mmol/L (ref 101–111)
GFR calc non Af Amer: >60 mL/min (ref >60)
Glucose, Bld: 127 mg/dL — ABNORMAL HIGH (ref 65–99)
Potassium: 3.7 mmol/L (ref 3.5–5.1)
SODIUM: 139 mmol/L (ref 135–145)
TOTAL PROTEIN: 7.4 g/dL (ref 6.5–8.1)

## 2017-06-16 LAB — TSH: TSH: 2.018 u[IU]/mL (ref 0.350–4.500)

## 2017-06-16 MED ORDER — SODIUM CHLORIDE 0.9 % IV SOLN
Freq: Once | INTRAVENOUS | Status: AC
  Administered 2017-06-16: 09:00:00 via INTRAVENOUS
  Filled 2017-06-16: qty 1000

## 2017-06-16 MED ORDER — SODIUM CHLORIDE 0.9 % IV SOLN
10.0000 mg/kg | Freq: Once | INTRAVENOUS | Status: AC
  Administered 2017-06-16: 1120 mg via INTRAVENOUS
  Filled 2017-06-16: qty 20

## 2017-06-16 MED ORDER — HEPARIN SOD (PORK) LOCK FLUSH 100 UNIT/ML IV SOLN
500.0000 [IU] | Freq: Once | INTRAVENOUS | Status: AC | PRN
  Administered 2017-06-16: 500 [IU]
  Filled 2017-06-16: qty 5

## 2017-06-16 NOTE — Progress Notes (Signed)
Patient here today for follow up.  Patient states no new concerns today  

## 2017-06-16 NOTE — Progress Notes (Signed)
[place] Cancer Follow Up Visit  Patient Care Team: Adline Potter, MD as PCP - General (Family Medicine) Telford Nab, RN as Registered Nurse  REASON FOR VISIT:  Follow up for evaluation prior to maintenance immunotherapy  HISTORY OF PRESENT ILLNESS/ PERTINENT ONCOLOGY HISTORY #  [name] 72 y.o. African American male previous healthy, former smoker who presents for treatment of lung cancer. He gets his usual care at [facility].  His cancer history listed as below.  He presented last month to urgent care due to coughing and hemoptysis. X ray showed left lung mass. Cough up small amount of blood mixed in the sputum. Denies SOB, chest pain, back pain, abdominal pain. Denies headache, vision changes. He works a Administrator.  # Images:  PET 09/06/2016  showed hypermetabolic left upper lobe mass as well as the adjacent nodule, ipsilateral hilar and mediastinal lymphadenopathy.   MRI brain showed no intracranial mets. CT showed LUL mass with a satellite lesion as well as mediastinal adenopathy.   CT chest w contrast 08/31/2016 1. Left peripheral upper lobe mass measuring up to 3.5 cm, likely primary lung cancer. 2. 8 mm satellite nodule just anterior to the mass. 3. Aortopulmonary window lymphadenopathy, possibly metastatic.  12/22/2016 CT chest w  3.3 cm left upper lobe mass and adjacent satellite nodule are stable in size, but show new central cavitation consistent with interval response to therapy. Decreased AP window and left hilar lymphadenopathy. No new or progressive metastatic disease.  03/08/2017 CT chest w 1. Today's study demonstrates a positive response to therapy with decreased size of left upper lobe cavitary nodule, and involving postradiation changes in the left lung, as above. 2. Mild diffuse bronchial thickening with mild centrilobular and paraseptal emphysema; imaging findings suggestive of underlying COPD. 3. Aortic atherosclerosis, in addition to three-vessel  coronary artery disease.   # Pathology: TPS 50%. 09/22/2016 left upper lobe biopsy showed squamous cell carcinoma. Foundation one test: ordered prior to patient first visit. Showed NO EGFR, ROS1, ALK, BRAF, MET mutation.   Cancer Treatment: S/p concurrent carbo/taxol and Radiation.  Currently Durvalumab Q2 weeks. started 01/27/2017, held after 5 cycles due to radiation pneumonitis. After steroid treatment, back on Durvalumab Q2weeks.   INTERVAL HISTORY Today [name] presented for follow up for assessment prior to immunotherapy..  Cough is "off and on". Uses inhaler PRN.  Patient has had a CT of chest done during interval.  Reports good appetite.  Lost 1 pound during the interval.  Denies shortness of breath, chest pain.  He continues working.  Feels tired.  Review of Systems  Constitutional: Positive for fatigue. Negative for appetite change, chills, diaphoresis, fever and unexpected weight change.  HENT:   Negative for hearing loss, lump/mass, mouth sores, nosebleeds and sore throat.   Eyes: Negative for eye problems and icterus.  Respiratory: Positive for cough. Negative for chest tightness, hemoptysis, shortness of breath and wheezing.   Cardiovascular: Negative for chest pain and leg swelling.  Gastrointestinal: Negative for abdominal distention, abdominal pain, blood in stool, diarrhea, nausea and rectal pain.  Endocrine: Negative for hot flashes.  Genitourinary: Negative for bladder incontinence, difficulty urinating, discharge, dysuria, frequency, hematuria, nocturia and pelvic pain.   Musculoskeletal: Negative for back pain, flank pain, gait problem, myalgias and neck pain.  Skin: Negative for itching, rash and wound.  Neurological: Negative for dizziness, gait problem, headaches, light-headedness, numbness and seizures.  Hematological: Negative for adenopathy. Does not bruise/bleed easily.  Psychiatric/Behavioral: Negative for confusion and decreased  concentration. The  patient is not nervous/anxious.        MEDICAL HISTORY: Past Medical History:  Diagnosis Date  . Enlarged prostate   . Squamous cell lung cancer, left ([place]) 08/2016    SURGICAL HISTORY: Past Surgical History:  Procedure Laterality Date  . NO PAST SURGERIES    . PORTACATH PLACEMENT Right 10/05/2016   Procedure: INSERTION PORT-A-CATH;  Surgeon: [name], MD;  Location: [facility] ORS;  Service: General;  Laterality: Right;    SOCIAL HISTORY: Social History   Socioeconomic History  . Marital status: Single    Spouse name: Not on file  . Number of children: Not on file  . Years of education: Not on file  . Highest education level: Not on file  Occupational History  . Not on file  Social Needs  . Financial resource strain: Not on file  . Food insecurity:    Worry: Not on file    Inability: Not on file  . Transportation needs:    Medical: Not on file    Non-medical: Not on file  Tobacco Use  . Smoking status: Former Smoker    Packs/day: 1.00    Years: 15.00    Pack years: 15.00    Types: Cigarettes    Last attempt to quit: 09/28/1992    Years since quitting: 24.7  . Smokeless tobacco: Never Used  Substance and Sexual Activity  . Alcohol use: No  . Drug use: No  . Sexual activity: Not on file  Lifestyle  . Physical activity:    Days per week: Not on file    Minutes per session: Not on file  . Stress: Not on file  Relationships  . Social connections:    Talks on phone: Not on file    Gets together: Not on file    Attends religious service: Not on file    Active member of club or organization: Not on file    Attends meetings of clubs or organizations: Not on file    Relationship status: Not on file  . Intimate partner violence:    Fear of current or ex partner: Not on file    Emotionally abused: Not on file    Physically abused: Not on file    Forced sexual activity: Not on file  Other Topics Concern  . Not on file  Social History Narrative  . Not on  file    FAMILY HISTORY Family History  Problem Relation Age of Onset  . Lymphoma Mother   . Diabetes Mother   . Lung cancer Sister   . Lung cancer Brother   . Lung cancer Brother     ALLERGIES:  has No Known Allergies.  MEDICATIONS:  Current Outpatient Medications  Medication Sig Dispense Refill  . acetaminophen (TYLENOL) 500 MG tablet Take 1,000 mg by mouth at bedtime.    Marland Kitchen albuterol (PROVENTIL HFA;VENTOLIN HFA) 108 (90 Base) MCG/ACT inhaler Inhale 2 puffs into the lungs every 6 (six) hours as needed for wheezing or shortness of breath. 1 Inhaler 2  . Cholecalciferol (VITAMIN D3) 2000 units capsule Take 1 capsule (2,000 Units total) by mouth daily.    . cyanocobalamin 500 MCG tablet Take 500 mcg by mouth daily.    . fluticasone (FLONASE) 50 MCG/ACT nasal spray Place 2 sprays into both nostrils daily. 16 g 6  . fluticasone (FLOVENT HFA) 44 MCG/ACT inhaler Inhale 2 puffs into the lungs 2 (two) times daily. 1 Inhaler 12  . lidocaine-prilocaine (EMLA) cream Apply to  affected area once 30 g 3  . mupirocin ointment (BACTROBAN) 2 % Apply 1 application topically 2 (two) times daily as needed. 22 g 2  . omeprazole (PRILOSEC) 20 MG capsule Take 1 capsule (20 mg total) by mouth daily. 30 capsule 5  . ondansetron (ZOFRAN) 8 MG tablet Take 1 tablet (8 mg total) by mouth 2 (two) times daily as needed for refractory nausea / vomiting. Start on day 3 after chemo. 30 tablet 1  . prochlorperazine (COMPAZINE) 10 MG tablet Take 1 tablet (10 mg total) by mouth every 6 (six) hours as needed (Nausea or vomiting). 30 tablet 1  . terazosin (HYTRIN) 1 MG capsule Take 1 capsule (1 mg total) by mouth at bedtime.     No current facility-administered medications for this visit.     PHYSICAL EXAMINATION:  ECOG PERFORMANCE STATUS: 1 - Symptomatic but completely ambulatory   Vitals:   06/16/17 1144  BP: 132/77  Pulse: 72  Resp: 16  Temp: (!) 95.9 F (35.5 C)  SpO2: 97%   Filed Weights   06/16/17  1144  Weight: 252 lb (114.3 kg)  Physical Exam  Constitutional: He is oriented to person, place, and time and well-developed, well-nourished, and in no distress. No distress.  HENT:  Head: Normocephalic and atraumatic.  Right Ear: External ear normal.  Left Ear: External ear normal.  Nose: Nose normal.  Mouth/Throat: Oropharynx is clear and moist. No oropharyngeal exudate.  Eyes: Pupils are equal, round, and reactive to light. Conjunctivae and EOM are normal. Right eye exhibits no discharge. Left eye exhibits no discharge. No scleral icterus.  Neck: Normal range of motion. Neck supple. No JVD present.  Cardiovascular: Normal rate, regular rhythm and normal heart sounds. Exam reveals no friction rub.  No murmur heard. Pulmonary/Chest: Effort normal and breath sounds normal. No respiratory distress. He has no wheezes. He has no rales. He exhibits no tenderness.  Abdominal: Soft. He exhibits no distension and no mass. There is no tenderness. There is no rebound and no guarding.  Musculoskeletal: Normal range of motion. He exhibits no edema, tenderness or deformity.  Lymphadenopathy:    He has no cervical adenopathy.  Neurological: He is alert and oriented to person, place, and time. No cranial nerve deficit. He exhibits normal muscle tone. Coordination normal.  Skin: Skin is warm and dry. No rash noted. He is not diaphoretic. No erythema. No pallor.  Psychiatric: Mood, memory, affect and judgment normal.      LABORATORY DATA: I have personally reviewed the data as listed: CBC    Component Value Date/Time   WBC 4.4 06/16/2017 0752   RBC 4.52 06/16/2017 0752   HGB 13.3 06/16/2017 0752   HCT 39.7 (L) 06/16/2017 0752   PLT 173 06/16/2017 0752   MCV 87.9 06/16/2017 0752   MCH 29.5 06/16/2017 0752   MCHC 33.6 06/16/2017 0752   RDW 14.5 06/16/2017 0752   LYMPHSABS 0.6 (L) 06/16/2017 0752   MONOABS 0.4 06/16/2017 0752   EOSABS 0.1 06/16/2017 0752   BASOSABS 0.0 06/16/2017 0752     CMP Latest Ref Rng & Units 06/16/2017 06/02/2017 05/15/2017  Glucose 65 - 99 mg/dL 127(H) 113(H) 120(H)  BUN 6 - 20 mg/dL _0 Creatinine 0.61 - 1.24 mg/dL 1.06 1.08 1.08  Sodium 135 - 145 mmol/L 139 139 137  Potassium 3.5 - 5.1 mmol/L 3.7 4.0 3.8  Chloride 101 - 111 mmol/L 109 110 108  CO2 22 - 32 mmol/L _1 Calcium  8.9 - 10.3 mg/dL 9.1 9.2 8.9  Total Protein 6.5 - 8.1 g/dL 7.4 7.0 7.1  Total Bilirubin 0.3 - 1.2 mg/dL 1.2 1.0 0.6  Alkaline Phos 38 - 126 U/L 69 58 58  AST 15 - 41 U/L _0 ALT 17 - 63 U/L 19 19 16(L)     ASSESSMENT/PLAN Cancer Staging Squamous cell lung cancer, left (HCC) Staging form: Lung, AJCC 8th Edition - Clinical stage from 09/27/2016: Stage IIIA (cT2a, cN2, cM0) - Signed by Earlie Server, MD on 09/29/2016  1. Squamous cell lung cancer, left ([place])   2. Encounter for antineoplastic immunotherapy   3. Cough   #CT was independently reviewed by me and discussed with patient and his wife and the daughter in detail. Stable disease except likely post radiation changes.  We will present patient's case on tumor board #Proceed with durvalumab every 2 weeks.  # Cough: continue using Flovent Q12h scheduled and Albuterol as needed.  #Follow-up in 2 weeks.  All questions were answered. The patient knows to call the clinic with any problems, questions or concerns.  Earlie Server, MD, PhD Hematology Oncology [facility] at [facility] Pager- [phone] 06/16/2017

## 2017-06-30 ENCOUNTER — Inpatient Hospital Stay: Payer: Medicare HMO | Admitting: Oncology

## 2017-06-30 ENCOUNTER — Inpatient Hospital Stay: Payer: Medicare HMO

## 2017-06-30 ENCOUNTER — Encounter: Payer: Self-pay | Admitting: Oncology

## 2017-06-30 ENCOUNTER — Other Ambulatory Visit: Payer: Self-pay

## 2017-06-30 VITALS — BP 120/72 | HR 72 | Temp 95.1°F | Wt 251.6 lb

## 2017-06-30 DIAGNOSIS — C3492 Malignant neoplasm of unspecified part of left bronchus or lung: Secondary | ICD-10-CM | POA: Diagnosis not present

## 2017-06-30 DIAGNOSIS — J700 Acute pulmonary manifestations due to radiation: Secondary | ICD-10-CM | POA: Diagnosis not present

## 2017-06-30 DIAGNOSIS — R05 Cough: Secondary | ICD-10-CM | POA: Diagnosis not present

## 2017-06-30 DIAGNOSIS — R059 Cough, unspecified: Secondary | ICD-10-CM

## 2017-06-30 DIAGNOSIS — R5383 Other fatigue: Secondary | ICD-10-CM

## 2017-06-30 DIAGNOSIS — Z5112 Encounter for antineoplastic immunotherapy: Secondary | ICD-10-CM | POA: Diagnosis not present

## 2017-06-30 DIAGNOSIS — R634 Abnormal weight loss: Secondary | ICD-10-CM | POA: Diagnosis not present

## 2017-06-30 DIAGNOSIS — Z801 Family history of malignant neoplasm of trachea, bronchus and lung: Secondary | ICD-10-CM | POA: Diagnosis not present

## 2017-06-30 DIAGNOSIS — Z87891 Personal history of nicotine dependence: Secondary | ICD-10-CM

## 2017-06-30 LAB — TSH: TSH: 1.393 u[IU]/mL (ref 0.350–4.500)

## 2017-06-30 LAB — CBC WITH DIFFERENTIAL/PLATELET
BASOS ABS: 0 10*3/uL (ref 0–0.1)
Basophils Relative: 0 %
Eosinophils Absolute: 0.2 10*3/uL (ref 0–0.7)
Eosinophils Relative: 4 %
HEMATOCRIT: 36.3 % — AB (ref 40.0–52.0)
HEMOGLOBIN: 12.3 g/dL — AB (ref 13.0–18.0)
LYMPHS PCT: 17 %
Lymphs Abs: 0.6 10*3/uL — ABNORMAL LOW (ref 1.0–3.6)
MCH: 29.6 pg (ref 26.0–34.0)
MCHC: 33.7 g/dL (ref 32.0–36.0)
MCV: 87.8 fL (ref 80.0–100.0)
MONO ABS: 0.3 10*3/uL (ref 0.2–1.0)
Monocytes Relative: 9 %
NEUTROS ABS: 2.4 10*3/uL (ref 1.4–6.5)
Neutrophils Relative %: 70 %
Platelets: 163 10*3/uL (ref 150–440)
RBC: 4.14 MIL/uL — ABNORMAL LOW (ref 4.40–5.90)
RDW: 14.4 % (ref 11.5–14.5)
WBC: 3.5 10*3/uL — ABNORMAL LOW (ref 3.8–10.6)

## 2017-06-30 LAB — COMPREHENSIVE METABOLIC PANEL
ALT: 22 U/L (ref 17–63)
AST: 26 U/L (ref 15–41)
Albumin: 3.8 g/dL (ref 3.5–5.0)
Alkaline Phosphatase: 68 U/L (ref 38–126)
Anion gap: 9 (ref 5–15)
BUN: 14 mg/dL (ref 6–20)
CO2: 22 mmol/L (ref 22–32)
Calcium: 9 mg/dL (ref 8.9–10.3)
Chloride: 108 mmol/L (ref 101–111)
Creatinine, Ser: 0.96 mg/dL (ref 0.61–1.24)
GFR calc Af Amer: >60 mL/min (ref >60)
GFR calc non Af Amer: >60 mL/min (ref >60)
Glucose, Bld: 123 mg/dL — ABNORMAL HIGH (ref 65–99)
Potassium: 3.7 mmol/L (ref 3.5–5.1)
Sodium: 139 mmol/L (ref 135–145)
Total Bilirubin: 0.9 mg/dL (ref 0.3–1.2)
Total Protein: 6.9 g/dL (ref 6.5–8.1)

## 2017-06-30 MED ORDER — HEPARIN SOD (PORK) LOCK FLUSH 100 UNIT/ML IV SOLN
500.0000 [IU] | Freq: Once | INTRAVENOUS | Status: AC
  Administered 2017-06-30: 500 [IU] via INTRAVENOUS
  Filled 2017-06-30: qty 5

## 2017-06-30 MED ORDER — SODIUM CHLORIDE 0.9 % IV SOLN
10.0000 mg/kg | Freq: Once | INTRAVENOUS | Status: AC
  Administered 2017-06-30: 1120 mg via INTRAVENOUS
  Filled 2017-06-30: qty 20

## 2017-06-30 MED ORDER — SODIUM CHLORIDE 0.9 % IV SOLN
Freq: Once | INTRAVENOUS | Status: AC
  Administered 2017-06-30: 09:00:00 via INTRAVENOUS
  Filled 2017-06-30: qty 1000

## 2017-06-30 MED ORDER — SODIUM CHLORIDE 0.9% FLUSH
10.0000 mL | INTRAVENOUS | Status: DC | PRN
  Administered 2017-06-30: 10 mL via INTRAVENOUS
  Filled 2017-06-30: qty 10

## 2017-06-30 NOTE — Progress Notes (Signed)
Patient here today for follow up.   

## 2017-06-30 NOTE — Progress Notes (Signed)
[place] Cancer Follow Up Visit  Patient Care Team: Adline Potter, MD (Inactive) as PCP - General (Family Medicine) Telford Nab, RN as Registered Nurse  REASON FOR VISIT:  Follow up for evaluation prior to maintenance immunotherapy  HISTORY OF PRESENT ILLNESS/ PERTINENT ONCOLOGY HISTORY #  [name] 72 y.o. African American male previous healthy, former smoker who presents for treatment of lung cancer. He gets his usual care at [facility].  His cancer history listed as below.  He presented last month to urgent care due to coughing and hemoptysis. X ray showed left lung mass. Cough up small amount of blood mixed in the sputum. Denies SOB, chest pain, back pain, abdominal pain. Denies headache, vision changes. He works a Administrator.  # Images:  PET 09/06/2016  showed hypermetabolic left upper lobe mass as well as the adjacent nodule, ipsilateral hilar and mediastinal lymphadenopathy.   MRI brain showed no intracranial mets. CT showed LUL mass with a satellite lesion as well as mediastinal adenopathy.   CT chest w contrast 08/31/2016 1. Left peripheral upper lobe mass measuring up to 3.5 cm, likely primary lung cancer. 2. 8 mm satellite nodule just anterior to the mass. 3. Aortopulmonary window lymphadenopathy, possibly metastatic.  12/22/2016 CT chest w  3.3 cm left upper lobe mass and adjacent satellite nodule are stable in size, but show new central cavitation consistent with interval response to therapy. Decreased AP window and left hilar lymphadenopathy. No new or progressive metastatic disease.  03/08/2017 CT chest w 1. Today's study demonstrates a positive response to therapy with decreased size of left upper lobe cavitary nodule, and involving postradiation changes in the left lung, as above. 2. Mild diffuse bronchial thickening with mild centrilobular and paraseptal emphysema; imaging findings suggestive of underlying COPD. 3. Aortic atherosclerosis, in addition to  three-vessel coronary artery disease.   06/09/2017 CT chest 1. Interval development of extensive irregular marginated consolidation throughout the left upper lobe most compatible with post radiation changes. Previously described centrally cavitary left upper lobe nodule is not well visualized on current exam due to interval suspected postradiation changes. Recommend attention on follow-up.   # Pathology: TPS 50%. 09/22/2016 left upper lobe biopsy showed squamous cell carcinoma. Foundation one test: ordered prior to patient first visit. Showed NO EGFR, ROS1, ALK, BRAF, MET mutation.   Cancer Treatment: S/p concurrent carbo/taxol and Radiation.  Currently Durvalumab Q2 weeks. started 01/27/2017, held after 5 cycles due to radiation pneumonitis. After steroid treatment, back on Durvalumab Q2weeks.   INTERVAL HISTORY Today [name] presented for follow up for assessment prior to immunotherapy. Problems are as follows:  Chronic Cough: off and on. Uses inhaler PRN.  Fatigue: mild.  Weight: lost 1 pound   Denies shortness of breath, chest pain.  He continues working.  Feels tired.  Review of Systems  Constitutional: Positive for fatigue. Negative for appetite change, chills, diaphoresis, fever and unexpected weight change.  HENT:   Negative for hearing loss, lump/mass, mouth sores, nosebleeds and sore throat.   Eyes: Negative for eye problems and icterus.  Respiratory: Positive for cough. Negative for chest tightness, hemoptysis, shortness of breath and wheezing.   Cardiovascular: Negative for chest pain and leg swelling.  Gastrointestinal: Negative for abdominal distention, abdominal pain, blood in stool, diarrhea, nausea and rectal pain.  Endocrine: Negative for hot flashes.  Genitourinary: Negative for bladder incontinence, difficulty urinating, discharge, dysuria, frequency, hematuria, nocturia and pelvic pain.   Musculoskeletal: Negative for back pain, flank pain, gait  problem,  myalgias and neck pain.  Skin: Negative for itching, rash and wound.  Neurological: Negative for dizziness, gait problem, headaches, light-headedness, numbness and seizures.  Hematological: Negative for adenopathy. Does not bruise/bleed easily.  Psychiatric/Behavioral: Negative for confusion and decreased concentration. The patient is not nervous/anxious.        MEDICAL HISTORY: Past Medical History:  Diagnosis Date  . Enlarged prostate   . Squamous cell lung cancer, left ([place]) 08/2016    SURGICAL HISTORY: Past Surgical History:  Procedure Laterality Date  . NO PAST SURGERIES    . PORTACATH PLACEMENT Right 10/05/2016   Procedure: INSERTION PORT-A-CATH;  Surgeon: [name], MD;  Location: [facility] ORS;  Service: General;  Laterality: Right;    SOCIAL HISTORY: Social History   Socioeconomic History  . Marital status: Single    Spouse name: Not on file  . Number of children: Not on file  . Years of education: Not on file  . Highest education level: Not on file  Occupational History  . Not on file  Social Needs  . Financial resource strain: Not on file  . Food insecurity:    Worry: Not on file    Inability: Not on file  . Transportation needs:    Medical: Not on file    Non-medical: Not on file  Tobacco Use  . Smoking status: Former Smoker    Packs/day: 1.00    Years: 15.00    Pack years: 15.00    Types: Cigarettes    Last attempt to quit: 09/28/1992    Years since quitting: 24.7  . Smokeless tobacco: Never Used  Substance and Sexual Activity  . Alcohol use: No  . Drug use: No  . Sexual activity: Not on file  Lifestyle  . Physical activity:    Days per week: Not on file    Minutes per session: Not on file  . Stress: Not on file  Relationships  . Social connections:    Talks on phone: Not on file    Gets together: Not on file    Attends religious service: Not on file    Active member of club or organization: Not on file    Attends meetings of clubs or  organizations: Not on file    Relationship status: Not on file  . Intimate partner violence:    Fear of current or ex partner: Not on file    Emotionally abused: Not on file    Physically abused: Not on file    Forced sexual activity: Not on file  Other Topics Concern  . Not on file  Social History Narrative  . Not on file    FAMILY HISTORY Family History  Problem Relation Age of Onset  . Lymphoma Mother   . Diabetes Mother   . Lung cancer Sister   . Lung cancer Brother   . Lung cancer Brother     ALLERGIES:  has No Known Allergies.  MEDICATIONS:  Current Outpatient Medications  Medication Sig Dispense Refill  . acetaminophen (TYLENOL) 500 MG tablet Take 1,000 mg by mouth at bedtime.    Marland Kitchen albuterol (PROVENTIL HFA;VENTOLIN HFA) 108 (90 Base) MCG/ACT inhaler Inhale 2 puffs into the lungs every 6 (six) hours as needed for wheezing or shortness of breath. 1 Inhaler 2  . Cholecalciferol (VITAMIN D3) 2000 units capsule Take 1 capsule (2,000 Units total) by mouth daily.    . cyanocobalamin 500 MCG tablet Take 500 mcg by mouth daily.    . fluticasone (FLONASE) 50  MCG/ACT nasal spray Place 2 sprays into both nostrils daily. 16 g 6  . fluticasone (FLOVENT HFA) 44 MCG/ACT inhaler Inhale 2 puffs into the lungs 2 (two) times daily. 1 Inhaler 12  . lidocaine-prilocaine (EMLA) cream Apply to affected area once 30 g 3  . mupirocin ointment (BACTROBAN) 2 % Apply 1 application topically 2 (two) times daily as needed. 22 g 2  . omeprazole (PRILOSEC) 20 MG capsule Take 1 capsule (20 mg total) by mouth daily. 30 capsule 5  . ondansetron (ZOFRAN) 8 MG tablet Take 1 tablet (8 mg total) by mouth 2 (two) times daily as needed for refractory nausea / vomiting. Start on day 3 after chemo. 30 tablet 1  . prochlorperazine (COMPAZINE) 10 MG tablet Take 1 tablet (10 mg total) by mouth every 6 (six) hours as needed (Nausea or vomiting). 30 tablet 1  . terazosin (HYTRIN) 1 MG capsule Take 1 capsule (1 mg  total) by mouth at bedtime.     No current facility-administered medications for this visit.    Facility-Administered Medications Ordered in Other Visits  Medication Dose Route Frequency Provider Last Rate Last Dose  . heparin lock flush 100 unit/mL  500 Units Intravenous Once Earlie Server, MD      . sodium chloride flush (NS) 0.9 % injection 10 mL  10 mL Intravenous PRN Earlie Server, MD   10 mL at 06/30/17 0757    PHYSICAL EXAMINATION:  ECOG PERFORMANCE STATUS: 1 - Symptomatic but completely ambulatory   Vitals:   06/30/17 0900  BP: 120/72  Pulse: 72  Temp: (!) 95.1 F (35.1 C)  SpO2: 98%   Filed Weights   06/30/17 0900  Weight: 251 lb 9 oz (114.1 kg)  Physical Exam  Constitutional: He is oriented to person, place, and time and well-developed, well-nourished, and in no distress. No distress.  HENT:  Head: Normocephalic and atraumatic.  Right Ear: External ear normal.  Left Ear: External ear normal.  Nose: Nose normal.  Mouth/Throat: Oropharynx is clear and moist. No oropharyngeal exudate.  Eyes: Pupils are equal, round, and reactive to light. Conjunctivae and EOM are normal. Right eye exhibits no discharge. Left eye exhibits no discharge. No scleral icterus.  Neck: Normal range of motion. Neck supple. No JVD present.  Cardiovascular: Normal rate, regular rhythm and normal heart sounds. Exam reveals no friction rub.  No murmur heard. Pulmonary/Chest: Effort normal and breath sounds normal. No respiratory distress. He has no wheezes. He has no rales. He exhibits no tenderness.  Abdominal: Soft. He exhibits no distension and no mass. There is no tenderness. There is no rebound and no guarding.  Musculoskeletal: Normal range of motion. He exhibits no edema, tenderness or deformity.  Lymphadenopathy:    He has no cervical adenopathy.  Neurological: He is alert and oriented to person, place, and time. No cranial nerve deficit. He exhibits normal muscle tone. Coordination normal.  Skin:  Skin is warm and dry. No rash noted. He is not diaphoretic. No erythema. No pallor.  Psychiatric: Mood, memory, affect and judgment normal.      LABORATORY DATA: I have personally reviewed the data as listed: CBC    Component Value Date/Time   WBC 3.5 (L) 06/30/2017 0801   RBC 4.14 (L) 06/30/2017 0801   HGB 12.3 (L) 06/30/2017 0801   HCT 36.3 (L) 06/30/2017 0801   PLT 163 06/30/2017 0801   MCV 87.8 06/30/2017 0801   MCH 29.6 06/30/2017 0801   MCHC 33.7 06/30/2017 0801  RDW 14.4 06/30/2017 0801   LYMPHSABS 0.6 (L) 06/30/2017 0801   MONOABS 0.3 06/30/2017 0801   EOSABS 0.2 06/30/2017 0801   BASOSABS 0.0 06/30/2017 0801    CMP Latest Ref Rng & Units 06/30/2017 06/16/2017 06/02/2017  Glucose 65 - 99 mg/dL 123(H) 127(H) 113(H)  BUN 6 - 20 mg/dL _0 Creatinine 0.61 - 1.24 mg/dL 0.96 1.06 1.08  Sodium 135 - 145 mmol/L 139 139 139  Potassium 3.5 - 5.1 mmol/L 3.7 3.7 4.0  Chloride 101 - 111 mmol/L 108 109 110  CO2 22 - 32 mmol/L _1 Calcium 8.9 - 10.3 mg/dL 9.0 9.1 9.2  Total Protein 6.5 - 8.1 g/dL 6.9 7.4 7.0  Total Bilirubin 0.3 - 1.2 mg/dL 0.9 1.2 1.0  Alkaline Phos 38 - 126 U/L 68 69 58  AST 15 - 41 U/L _2 ALT 17 - 63 U/L _3 ASSESSMENT/PLAN Cancer Staging Squamous cell lung cancer, left (HCC) Staging form: Lung, AJCC 8th Edition - Clinical stage from 09/27/2016: Stage IIIA (cT2a, cN2, cM0) - Signed by Earlie Server, MD on 09/29/2016  1. Squamous cell lung cancer, left ([place])   2. Encounter for antineoplastic immunotherapy   3. Cough   4. Weight loss   5. Other fatigue   #Squamous cell lung cancer, Stable on maintenance immunotherapy.  Proceed with durvalumab every 2 weeks.  CT scan was presented on tumor board on 06/22/2017, consensus is stable disease, with postradiation changes. Monitor #  # Cough/ Radiation Pneumonitis, Improved.  continue using Flovent Q12h scheduled and Albuterol as needed.  # Weight loss: lost one pound during 2 weeks. TSH  normal. continue monitor. Will refer to Dietitian.   #Follow-up in 2 weeks for ;abs and Durvalumab, see me in 4 weeks for assessment prior to Durvalumab.   All questions were answered. The patient knows to call the clinic with any problems, questions or concerns.  Earlie Server, MD, PhD Hematology Oncology [facility] at [facility] Pager- [phone] 06/30/2017

## 2017-07-14 ENCOUNTER — Inpatient Hospital Stay: Payer: Medicare HMO

## 2017-07-14 VITALS — BP 126/68 | HR 65 | Temp 98.1°F | Resp 17 | Wt 250.2 lb

## 2017-07-14 DIAGNOSIS — Z5112 Encounter for antineoplastic immunotherapy: Secondary | ICD-10-CM | POA: Diagnosis not present

## 2017-07-14 DIAGNOSIS — C3492 Malignant neoplasm of unspecified part of left bronchus or lung: Secondary | ICD-10-CM

## 2017-07-14 LAB — COMPREHENSIVE METABOLIC PANEL
ALK PHOS: 64 U/L (ref 38–126)
ALT: 21 U/L (ref 17–63)
ANION GAP: 8 (ref 5–15)
AST: 19 U/L (ref 15–41)
Albumin: 3.9 g/dL (ref 3.5–5.0)
BILIRUBIN TOTAL: 0.8 mg/dL (ref 0.3–1.2)
BUN: 19 mg/dL (ref 6–20)
CALCIUM: 9.1 mg/dL (ref 8.9–10.3)
CO2: 22 mmol/L (ref 22–32)
Chloride: 108 mmol/L (ref 101–111)
Creatinine, Ser: 1.03 mg/dL (ref 0.61–1.24)
GFR calc Af Amer: >60 mL/min (ref >60)
GFR calc non Af Amer: >60 mL/min (ref >60)
Glucose, Bld: 111 mg/dL — ABNORMAL HIGH (ref 65–99)
Potassium: 3.9 mmol/L (ref 3.5–5.1)
Sodium: 138 mmol/L (ref 135–145)
TOTAL PROTEIN: 7.3 g/dL (ref 6.5–8.1)

## 2017-07-14 LAB — CBC WITH DIFFERENTIAL/PLATELET
BASOS ABS: 0 10*3/uL (ref 0–0.1)
BASOS PCT: 1 %
Eosinophils Absolute: 0.2 10*3/uL (ref 0–0.7)
Eosinophils Relative: 4 %
HEMATOCRIT: 37.1 % — AB (ref 40.0–52.0)
HEMOGLOBIN: 12.4 g/dL — AB (ref 13.0–18.0)
Lymphocytes Relative: 16 %
Lymphs Abs: 0.7 10*3/uL — ABNORMAL LOW (ref 1.0–3.6)
MCH: 29.3 pg (ref 26.0–34.0)
MCHC: 33.4 g/dL (ref 32.0–36.0)
MCV: 87.6 fL (ref 80.0–100.0)
Monocytes Absolute: 0.4 10*3/uL (ref 0.2–1.0)
Monocytes Relative: 9 %
NEUTROS ABS: 2.9 10*3/uL (ref 1.4–6.5)
Neutrophils Relative %: 70 %
Platelets: 156 10*3/uL (ref 150–440)
RBC: 4.23 MIL/uL — AB (ref 4.40–5.90)
RDW: 14.3 % (ref 11.5–14.5)
WBC: 4.2 10*3/uL (ref 3.8–10.6)

## 2017-07-14 MED ORDER — SODIUM CHLORIDE 0.9 % IV SOLN
Freq: Once | INTRAVENOUS | Status: AC
  Administered 2017-07-14: 09:00:00 via INTRAVENOUS
  Filled 2017-07-14: qty 1000

## 2017-07-14 MED ORDER — HEPARIN SOD (PORK) LOCK FLUSH 100 UNIT/ML IV SOLN
500.0000 [IU] | Freq: Once | INTRAVENOUS | Status: AC | PRN
  Administered 2017-07-14: 500 [IU]
  Filled 2017-07-14: qty 5

## 2017-07-14 MED ORDER — SODIUM CHLORIDE 0.9 % IV SOLN
10.0000 mg/kg | Freq: Once | INTRAVENOUS | Status: AC
  Administered 2017-07-14: 1120 mg via INTRAVENOUS
  Filled 2017-07-14: qty 20

## 2017-07-14 MED ORDER — SODIUM CHLORIDE 0.9% FLUSH
10.0000 mL | INTRAVENOUS | Status: DC | PRN
  Filled 2017-07-14: qty 10

## 2017-07-28 ENCOUNTER — Inpatient Hospital Stay: Payer: Medicare HMO | Admitting: Oncology

## 2017-07-28 ENCOUNTER — Encounter: Payer: Self-pay | Admitting: Oncology

## 2017-07-28 ENCOUNTER — Inpatient Hospital Stay: Payer: Medicare HMO

## 2017-07-28 ENCOUNTER — Inpatient Hospital Stay: Payer: Medicare HMO | Attending: Oncology

## 2017-07-28 ENCOUNTER — Other Ambulatory Visit: Payer: Self-pay

## 2017-07-28 VITALS — BP 125/75 | HR 83 | Temp 96.4°F | Resp 18 | Wt 252.0 lb

## 2017-07-28 DIAGNOSIS — J700 Acute pulmonary manifestations due to radiation: Secondary | ICD-10-CM | POA: Insufficient documentation

## 2017-07-28 DIAGNOSIS — Z5112 Encounter for antineoplastic immunotherapy: Secondary | ICD-10-CM

## 2017-07-28 DIAGNOSIS — J449 Chronic obstructive pulmonary disease, unspecified: Secondary | ICD-10-CM

## 2017-07-28 DIAGNOSIS — C3492 Malignant neoplasm of unspecified part of left bronchus or lung: Secondary | ICD-10-CM

## 2017-07-28 DIAGNOSIS — Z79899 Other long term (current) drug therapy: Secondary | ICD-10-CM | POA: Insufficient documentation

## 2017-07-28 DIAGNOSIS — R42 Dizziness and giddiness: Secondary | ICD-10-CM

## 2017-07-28 LAB — COMPREHENSIVE METABOLIC PANEL
ALT: 18 U/L (ref 17–63)
ANION GAP: 9 (ref 5–15)
AST: 23 U/L (ref 15–41)
Albumin: 4 g/dL (ref 3.5–5.0)
Alkaline Phosphatase: 61 U/L (ref 38–126)
BUN: 17 mg/dL (ref 6–20)
CHLORIDE: 107 mmol/L (ref 101–111)
CO2: 21 mmol/L — ABNORMAL LOW (ref 22–32)
CREATININE: 1.05 mg/dL (ref 0.61–1.24)
Calcium: 9 mg/dL (ref 8.9–10.3)
Glucose, Bld: 137 mg/dL — ABNORMAL HIGH (ref 65–99)
Potassium: 3.6 mmol/L (ref 3.5–5.1)
Sodium: 137 mmol/L (ref 135–145)
Total Bilirubin: 0.9 mg/dL (ref 0.3–1.2)
Total Protein: 7.3 g/dL (ref 6.5–8.1)

## 2017-07-28 LAB — CBC WITH DIFFERENTIAL/PLATELET
BASOS PCT: 0 %
Basophils Absolute: 0 10*3/uL (ref 0–0.1)
EOS ABS: 0.1 10*3/uL (ref 0–0.7)
Eosinophils Relative: 3 %
HCT: 38.2 % — ABNORMAL LOW (ref 40.0–52.0)
Hemoglobin: 12.7 g/dL — ABNORMAL LOW (ref 13.0–18.0)
LYMPHS ABS: 0.7 10*3/uL — AB (ref 1.0–3.6)
Lymphocytes Relative: 15 %
MCH: 29.1 pg (ref 26.0–34.0)
MCHC: 33.2 g/dL (ref 32.0–36.0)
MCV: 87.9 fL (ref 80.0–100.0)
Monocytes Absolute: 0.3 10*3/uL (ref 0.2–1.0)
Monocytes Relative: 6 %
Neutro Abs: 3.4 10*3/uL (ref 1.4–6.5)
Neutrophils Relative %: 76 %
Platelets: 173 10*3/uL (ref 150–440)
RBC: 4.35 MIL/uL — AB (ref 4.40–5.90)
RDW: 14.7 % — ABNORMAL HIGH (ref 11.5–14.5)
WBC: 4.5 10*3/uL (ref 3.8–10.6)

## 2017-07-28 MED ORDER — HEPARIN SOD (PORK) LOCK FLUSH 100 UNIT/ML IV SOLN: 500.0000 [IU] | Freq: Once | INTRAVENOUS | Status: DC | PRN

## 2017-07-28 MED ORDER — SODIUM CHLORIDE 0.9 % IV SOLN
Freq: Once | INTRAVENOUS | Status: AC
  Administered 2017-07-28: 10:00:00 via INTRAVENOUS
  Filled 2017-07-28: qty 1000

## 2017-07-28 MED ORDER — SODIUM CHLORIDE 0.9 % IV SOLN
10.0000 mg/kg | Freq: Once | INTRAVENOUS | Status: AC
  Administered 2017-07-28: 1120 mg via INTRAVENOUS
  Filled 2017-07-28: qty 2.4

## 2017-07-28 MED ORDER — SODIUM CHLORIDE 0.9% FLUSH
10.0000 mL | Freq: Once | INTRAVENOUS | Status: AC
  Administered 2017-07-28: 10 mL via INTRAVENOUS
  Filled 2017-07-28: qty 10

## 2017-07-28 MED ORDER — HEPARIN SOD (PORK) LOCK FLUSH 100 UNIT/ML IV SOLN
500.0000 [IU] | Freq: Once | INTRAVENOUS | Status: AC
  Administered 2017-07-28: 500 [IU] via INTRAVENOUS
  Filled 2017-07-28: qty 5

## 2017-07-28 NOTE — Progress Notes (Signed)
Patient here today for follow up. No concerns voiced.  °

## 2017-07-28 NOTE — Progress Notes (Signed)
[name]  Patient Care Team: Adline Potter, MD (Inactive) as PCP - General (Family Medicine) Telford Nab, RN as Registered Nurse  REASON FOR [name]:  Follow up for evaluation prior to maintenance immunotherapy  HISTORY OF PRESENT ILLNESS/ PERTINENT ONCOLOGY HISTORY #  Stage IIIA lung squamous cell cancer. S/p concurrent chemotherapy and Radiation.[ completed Oct 2018], started on Durvalumab in Nov 2019.  06/09/17 CT chest. scan was presented on tumor board on 06/22/2017, consensus is stable disease, with postradiation changes. Monitor # Pathology: TPS 50%. 09/22/2016 left upper lobe biopsy showed squamous cell carcinoma. Foundation one test: ordered prior to patient first [name]. Showed NO EGFR, ROS1, ALK, BRAF, MET mutation.   Cancer Treatment: S/p concurrent carbo/taxol and Radiation.  Currently Durvalumab Q2 weeks. held after 5 cycles due to radiation pneumonitis. After steroid treatment, back on Durvalumab Q2weeks.   INTERVAL HISTORY Today [name] presented for follow up for assessment prior to immunotherapy for treatment of lung squamous cell lung cancer stage IIIa. Problems are as follows:  Immunotherapy: Tolerates without significant side effects. Chronic Cough: off and on. Uses inhaler PRN.  Reports getting better.  Denies shortness of breath, chest pain. Fatigue: Chronic, stable. Weight: Stable. Reports feeling lightheadedness yesterday, acute onset, last a few minutes, spontaneously resolved.  Aggravated by turning his head to certain position.  Not associated with nausea vomiting, sensation of room spinning.  He reports stay hydrated.  This is a new problem for him.  Review of Systems  Constitutional: Positive for fatigue. Negative for appetite change, chills, diaphoresis, fever and unexpected weight change.  HENT:   Negative for hearing loss, lump/mass, mouth sores, nosebleeds and sore throat.   Eyes: Negative for eye problems  and icterus.  Respiratory: Positive for cough. Negative for chest tightness, hemoptysis, shortness of breath and wheezing.   Cardiovascular: Negative for chest pain and leg swelling.  Gastrointestinal: Negative for abdominal distention, abdominal pain, blood in stool, diarrhea, nausea and rectal pain.  Endocrine: Negative for hot flashes.  Genitourinary: Negative for bladder incontinence, difficulty urinating, discharge, dysuria, frequency, hematuria, nocturia and pelvic pain.   Musculoskeletal: Negative for back pain, flank pain, gait problem, myalgias and neck pain.  Skin: Negative for itching, rash and wound.  Neurological: Negative for dizziness, gait problem, headaches, light-headedness, numbness and seizures.  Hematological: Negative for adenopathy. Does not bruise/bleed easily.  Psychiatric/Behavioral: Negative for confusion and decreased concentration. The patient is not nervous/anxious.        MEDICAL HISTORY: Past Medical History:  Diagnosis Date  . Enlarged prostate   . Squamous cell lung cancer, left ([place]) 08/2016    SURGICAL HISTORY: Past Surgical History:  Procedure Laterality Date  . NO PAST SURGERIES    . PORTACATH PLACEMENT Right 10/05/2016   Procedure: INSERTION PORT-A-CATH;  Surgeon: [name], MD;  Location: [facility] ORS;  Service: General;  Laterality: Right;    SOCIAL HISTORY: Social History   Socioeconomic History  . Marital status: Single    Spouse name: Not on file  . Number of children: Not on file  . Years of education: Not on file  . Highest education level: Not on file  Occupational History  . Not on file  Social Needs  . Financial resource strain: Not on file  . Food insecurity:    Worry: Not on file    Inability: Not on file  . Transportation needs:    Medical: Not on file    Non-medical: Not on file  Tobacco  Use  . Smoking status: Former Smoker    Packs/day: 1.00    Years: 15.00    Pack years: 15.00    Types: Cigarettes    Last  attempt to quit: 09/28/1992    Years since quitting: 24.8  . Smokeless tobacco: Never Used  Substance and Sexual Activity  . Alcohol use: No  . Drug use: No  . Sexual activity: Not on file  Lifestyle  . Physical activity:    Days per week: Not on file    Minutes per session: Not on file  . Stress: Not on file  Relationships  . Social connections:    Talks on phone: Not on file    Gets together: Not on file    Attends religious service: Not on file    Active member of club or organization: Not on file    Attends meetings of clubs or organizations: Not on file    Relationship status: Not on file  . Intimate partner violence:    Fear of current or ex partner: Not on file    Emotionally abused: Not on file    Physically abused: Not on file    Forced sexual activity: Not on file  Other Topics Concern  . Not on file  Social History Narrative  . Not on file    FAMILY HISTORY Family History  Problem Relation Age of Onset  . Lymphoma Mother   . Diabetes Mother   . Lung cancer Sister   . Lung cancer Brother   . Lung cancer Brother     ALLERGIES:  has No Known Allergies.  MEDICATIONS:  Current Outpatient Medications  Medication Sig Dispense Refill  . acetaminophen (TYLENOL) 500 MG tablet Take 1,000 mg by mouth at bedtime.    Marland Kitchen albuterol (PROVENTIL HFA;VENTOLIN HFA) 108 (90 Base) MCG/ACT inhaler Inhale 2 puffs into the lungs every 6 (six) hours as needed for wheezing or shortness of breath. 1 Inhaler 2  . Cholecalciferol (VITAMIN D3) 2000 units capsule Take 1 capsule (2,000 Units total) by mouth daily.    . cyanocobalamin 500 MCG tablet Take 500 mcg by mouth daily.    . fluticasone (FLONASE) 50 MCG/ACT nasal spray Place 2 sprays into both nostrils daily. 16 g 6  . fluticasone (FLOVENT HFA) 44 MCG/ACT inhaler Inhale 2 puffs into the lungs 2 (two) times daily. 1 Inhaler 12  . mupirocin ointment (BACTROBAN) 2 % Apply 1 application topically 2 (two) times daily as needed. 22 g 2   . omeprazole (PRILOSEC) 20 MG capsule Take 1 capsule (20 mg total) by mouth daily. 30 capsule 5  . terazosin (HYTRIN) 1 MG capsule Take 1 capsule (1 mg total) by mouth at bedtime.     No current facility-administered medications for this [name].    Facility-Administered Medications Ordered in Other Visits  Medication Dose Route Frequency Provider Last Rate Last Dose  . heparin lock flush 100 unit/mL  500 Units Intracatheter Once PRN Earlie Server, MD        PHYSICAL EXAMINATION:  ECOG PERFORMANCE STATUS: 1 - Symptomatic but completely ambulatory   Vitals:   07/28/17 0922  BP: 125/75  Pulse: 83  Resp: 18  Temp: (!) 96.4 F (35.8 C)  SpO2: 97%   Filed Weights   07/28/17 0922  Weight: 252 lb (114.3 kg)  Physical Exam  Constitutional: He is oriented to person, place, and time and well-developed, well-nourished, and in no distress. No distress.  HENT:  Head: Normocephalic and atraumatic.  Right  Ear: External ear normal.  Left Ear: External ear normal.  Nose: Nose normal.  Mouth/Throat: Oropharynx is clear and moist. No oropharyngeal exudate.  Eyes: Pupils are equal, round, and reactive to light. Conjunctivae and EOM are normal. Right eye exhibits no discharge. Left eye exhibits no discharge. No scleral icterus.  Neck: Normal range of motion. Neck supple. No JVD present.  Cardiovascular: Normal rate, regular rhythm and normal heart sounds. Exam reveals no friction rub.  No murmur heard. Pulmonary/Chest: Effort normal. No respiratory distress. He has no wheezes. He has no rales. He exhibits no tenderness.    Decreased breath sounds bilaterally.  Abdominal: Soft. He exhibits no distension and no mass. There is no tenderness. There is no rebound and no guarding.  Musculoskeletal: Normal range of motion. He exhibits no edema, tenderness or deformity.  Lymphadenopathy:    He has no cervical adenopathy.  Neurological: He is alert and oriented to person, place, and time. No cranial nerve  deficit. He exhibits normal muscle tone. Coordination normal.  Skin: Skin is warm and dry. No rash noted. He is not diaphoretic. No erythema. No pallor.  Psychiatric: Mood, memory, affect and judgment normal.      LABORATORY DATA: I have personally reviewed the data as listed: CBC    Component Value Date/Time   WBC 4.5 07/28/2017 0855   RBC 4.35 (L) 07/28/2017 0855   HGB 12.7 (L) 07/28/2017 0855   HCT 38.2 (L) 07/28/2017 0855   PLT 173 07/28/2017 0855   MCV 87.9 07/28/2017 0855   MCH 29.1 07/28/2017 0855   MCHC 33.2 07/28/2017 0855   RDW 14.7 (H) 07/28/2017 0855   LYMPHSABS 0.7 (L) 07/28/2017 0855   MONOABS 0.3 07/28/2017 0855   EOSABS 0.1 07/28/2017 0855   BASOSABS 0.0 07/28/2017 0855    CMP Latest Ref Rng & Units 07/28/2017 07/14/2017 06/30/2017  Glucose 65 - 99 mg/dL 137(H) 111(H) 123(H)  BUN 6 - 20 mg/dL _0 Creatinine 0.61 - 1.24 mg/dL 1.05 1.03 0.96  Sodium 135 - 145 mmol/L 137 138 139  Potassium 3.5 - 5.1 mmol/L 3.6 3.9 3.7  Chloride 101 - 111 mmol/L 107 108 108  CO2 22 - 32 mmol/L 21(L) 22 22  Calcium 8.9 - 10.3 mg/dL 9.0 9.1 9.0  Total Protein 6.5 - 8.1 g/dL 7.3 7.3 6.9  Total Bilirubin 0.3 - 1.2 mg/dL 0.9 0.8 0.9  Alkaline Phos 38 - 126 U/L 61 64 68  AST 15 - 41 U/L _1 ALT 17 - 63 U/L _2 ASSESSMENT/PLAN Cancer Staging Squamous cell lung cancer, left (HCC) Staging form: Lung, AJCC 8th Edition - Clinical stage from 09/27/2016: Stage IIIA (cT2a, cN2, cM0) - Signed by Earlie Server, MD on 09/29/2016  1. Squamous cell lung cancer, left ([place])   2. Encounter for antineoplastic immunotherapy   3. Lightheadedness   #Squamous cell lung cancer,  Stable on maintenance immunotherapy with durvalumab.  Labs are reviewed.  Counts are acceptable.  Proceed with today's durvalumab treatment. Plan repeat CT chest every 3 months.  Next due at the end of July.  # Cough/ Radiation Pneumonitis, improved.  Currently no signs of shortness of breath.  Has  underlying COPD.  Continue using Flovent every 12 hours scheduled and albuterol as needed # Weight loss: Weight has been stable. #Lightheadedness, transient, spontaneously resolved.  Given his clinical history, I suspect he has BPPV.  Currently he is asymptomatic.  I recommend patient to talk to  primary care and be referred to ENT for further management.  Patient voices understanding. Follow-up in 2 weeks for labs and durvalumab treatment. See me in 4 weeks for labs and assessment prior to the  All questions were answered. The patient knows to call the clinic with any problems, questions or concerns. Total face to face encounter time for this patient [name] was 35mn. >50% of the time was  spent in counseling and coordination of care.   ZEarlie Server MD, PhD Hematology Oncology CProv[facility] Falls [facility] Centerat APaul [facility] HospitalPager- [phone]/14/2019

## 2017-08-11 ENCOUNTER — Inpatient Hospital Stay: Payer: Medicare HMO

## 2017-08-11 VITALS — BP 126/72 | HR 76 | Temp 96.0°F | Resp 20 | Wt 254.5 lb

## 2017-08-11 DIAGNOSIS — C3492 Malignant neoplasm of unspecified part of left bronchus or lung: Secondary | ICD-10-CM

## 2017-08-11 DIAGNOSIS — Z5112 Encounter for antineoplastic immunotherapy: Secondary | ICD-10-CM | POA: Diagnosis not present

## 2017-08-11 LAB — COMPREHENSIVE METABOLIC PANEL
ALBUMIN: 4 g/dL (ref 3.5–5.0)
ALK PHOS: 68 U/L (ref 38–126)
ALT: 18 U/L (ref 0–44)
ANION GAP: 6 (ref 5–15)
AST: 21 U/L (ref 15–41)
BILIRUBIN TOTAL: 0.7 mg/dL (ref 0.3–1.2)
BUN: 14 mg/dL (ref 8–23)
CALCIUM: 9 mg/dL (ref 8.9–10.3)
CO2: 23 mmol/L (ref 22–32)
Chloride: 110 mmol/L (ref 98–111)
Creatinine, Ser: 1.1 mg/dL (ref 0.61–1.24)
GFR calc Af Amer: >60 mL/min (ref >60)
Glucose, Bld: 120 mg/dL — ABNORMAL HIGH (ref 70–99)
POTASSIUM: 3.9 mmol/L (ref 3.5–5.1)
Sodium: 139 mmol/L (ref 135–145)
TOTAL PROTEIN: 7.3 g/dL (ref 6.5–8.1)

## 2017-08-11 LAB — CBC WITH DIFFERENTIAL/PLATELET
BASOS ABS: 0 10*3/uL (ref 0–0.1)
BASOS PCT: 0 %
EOS PCT: 3 %
Eosinophils Absolute: 0.2 10*3/uL (ref 0–0.7)
HCT: 38.4 % — ABNORMAL LOW (ref 40.0–52.0)
Hemoglobin: 12.5 g/dL — ABNORMAL LOW (ref 13.0–18.0)
LYMPHS PCT: 12 %
Lymphs Abs: 0.7 10*3/uL — ABNORMAL LOW (ref 1.0–3.6)
MCH: 28.8 pg (ref 26.0–34.0)
MCHC: 32.6 g/dL (ref 32.0–36.0)
MCV: 88.2 fL (ref 80.0–100.0)
Monocytes Absolute: 0.4 10*3/uL (ref 0.2–1.0)
Monocytes Relative: 8 %
NEUTROS ABS: 4.1 10*3/uL (ref 1.4–6.5)
Neutrophils Relative %: 77 %
Platelets: 167 10*3/uL (ref 150–440)
RBC: 4.36 MIL/uL — AB (ref 4.40–5.90)
RDW: 15 % — ABNORMAL HIGH (ref 11.5–14.5)
WBC: 5.4 10*3/uL (ref 3.8–10.6)

## 2017-08-11 LAB — TSH: TSH: 1.402 u[IU]/mL (ref 0.350–4.500)

## 2017-08-11 MED ORDER — SODIUM CHLORIDE 0.9 % IV SOLN
Freq: Once | INTRAVENOUS | Status: AC
  Administered 2017-08-11: 09:00:00 via INTRAVENOUS
  Filled 2017-08-11: qty 1000

## 2017-08-11 MED ORDER — SODIUM CHLORIDE 0.9% FLUSH
10.0000 mL | INTRAVENOUS | Status: DC | PRN
  Administered 2017-08-11: 10 mL via INTRAVENOUS
  Filled 2017-08-11: qty 10

## 2017-08-11 MED ORDER — HEPARIN SOD (PORK) LOCK FLUSH 100 UNIT/ML IV SOLN
500.0000 [IU] | Freq: Once | INTRAVENOUS | Status: AC
  Administered 2017-08-11: 500 [IU] via INTRAVENOUS
  Filled 2017-08-11: qty 5

## 2017-08-11 MED ORDER — SODIUM CHLORIDE 0.9 % IV SOLN
10.0000 mg/kg | Freq: Once | INTRAVENOUS | Status: AC
  Administered 2017-08-11: 1120 mg via INTRAVENOUS
  Filled 2017-08-11: qty 20

## 2017-08-23 ENCOUNTER — Other Ambulatory Visit: Payer: Self-pay | Admitting: *Deleted

## 2017-08-23 DIAGNOSIS — C3492 Malignant neoplasm of unspecified part of left bronchus or lung: Secondary | ICD-10-CM

## 2017-08-25 ENCOUNTER — Inpatient Hospital Stay: Payer: Medicare HMO | Attending: Oncology

## 2017-08-25 ENCOUNTER — Other Ambulatory Visit: Payer: Self-pay

## 2017-08-25 ENCOUNTER — Inpatient Hospital Stay: Payer: Medicare HMO | Admitting: Oncology

## 2017-08-25 ENCOUNTER — Encounter: Payer: Self-pay | Admitting: Oncology

## 2017-08-25 ENCOUNTER — Inpatient Hospital Stay: Payer: Medicare HMO

## 2017-08-25 ENCOUNTER — Other Ambulatory Visit: Payer: Self-pay | Admitting: Family Medicine

## 2017-08-25 VITALS — BP 131/74 | HR 71 | Temp 98.0°F | Resp 18 | Wt 252.6 lb

## 2017-08-25 DIAGNOSIS — Z801 Family history of malignant neoplasm of trachea, bronchus and lung: Secondary | ICD-10-CM | POA: Insufficient documentation

## 2017-08-25 DIAGNOSIS — Z923 Personal history of irradiation: Secondary | ICD-10-CM | POA: Diagnosis not present

## 2017-08-25 DIAGNOSIS — M545 Low back pain: Secondary | ICD-10-CM | POA: Insufficient documentation

## 2017-08-25 DIAGNOSIS — R05 Cough: Secondary | ICD-10-CM

## 2017-08-25 DIAGNOSIS — C3412 Malignant neoplasm of upper lobe, left bronchus or lung: Secondary | ICD-10-CM | POA: Insufficient documentation

## 2017-08-25 DIAGNOSIS — Z9221 Personal history of antineoplastic chemotherapy: Secondary | ICD-10-CM | POA: Insufficient documentation

## 2017-08-25 DIAGNOSIS — Z5112 Encounter for antineoplastic immunotherapy: Secondary | ICD-10-CM | POA: Insufficient documentation

## 2017-08-25 DIAGNOSIS — Z87891 Personal history of nicotine dependence: Secondary | ICD-10-CM | POA: Diagnosis not present

## 2017-08-25 DIAGNOSIS — R530 Neoplastic (malignant) related fatigue: Secondary | ICD-10-CM | POA: Diagnosis not present

## 2017-08-25 DIAGNOSIS — D649 Anemia, unspecified: Secondary | ICD-10-CM | POA: Diagnosis not present

## 2017-08-25 DIAGNOSIS — G8929 Other chronic pain: Secondary | ICD-10-CM

## 2017-08-25 DIAGNOSIS — Z79899 Other long term (current) drug therapy: Secondary | ICD-10-CM | POA: Diagnosis not present

## 2017-08-25 DIAGNOSIS — C3492 Malignant neoplasm of unspecified part of left bronchus or lung: Secondary | ICD-10-CM

## 2017-08-25 LAB — CBC WITH DIFFERENTIAL/PLATELET
BASOS ABS: 0 10*3/uL (ref 0–0.1)
Basophils Relative: 1 %
Eosinophils Absolute: 0.2 10*3/uL (ref 0–0.7)
Eosinophils Relative: 5 %
HEMATOCRIT: 37.8 % — AB (ref 40.0–52.0)
Hemoglobin: 12.5 g/dL — ABNORMAL LOW (ref 13.0–18.0)
LYMPHS PCT: 16 %
Lymphs Abs: 0.7 10*3/uL — ABNORMAL LOW (ref 1.0–3.6)
MCH: 29.3 pg (ref 26.0–34.0)
MCHC: 33.1 g/dL (ref 32.0–36.0)
MCV: 88.5 fL (ref 80.0–100.0)
MONO ABS: 0.4 10*3/uL (ref 0.2–1.0)
Monocytes Relative: 9 %
NEUTROS ABS: 3.1 10*3/uL (ref 1.4–6.5)
NEUTROS PCT: 69 %
Platelets: 170 10*3/uL (ref 150–440)
RBC: 4.27 MIL/uL — AB (ref 4.40–5.90)
RDW: 14.2 % (ref 11.5–14.5)
WBC: 4.5 10*3/uL (ref 3.8–10.6)

## 2017-08-25 LAB — COMPREHENSIVE METABOLIC PANEL
ALBUMIN: 3.9 g/dL (ref 3.5–5.0)
ALT: 17 U/L (ref 0–44)
ANION GAP: 9 (ref 5–15)
AST: 21 U/L (ref 15–41)
Alkaline Phosphatase: 68 U/L (ref 38–126)
BILIRUBIN TOTAL: 0.7 mg/dL (ref 0.3–1.2)
BUN: 18 mg/dL (ref 8–23)
CO2: 21 mmol/L — ABNORMAL LOW (ref 22–32)
Calcium: 9 mg/dL (ref 8.9–10.3)
Chloride: 109 mmol/L (ref 98–111)
Creatinine, Ser: 1.2 mg/dL (ref 0.61–1.24)
GFR calc Af Amer: >60 mL/min (ref >60)
GFR calc non Af Amer: 59 mL/min — ABNORMAL LOW (ref >60)
GLUCOSE: 121 mg/dL — AB (ref 70–99)
POTASSIUM: 3.7 mmol/L (ref 3.5–5.1)
SODIUM: 139 mmol/L (ref 135–145)
TOTAL PROTEIN: 7.4 g/dL (ref 6.5–8.1)

## 2017-08-25 MED ORDER — SODIUM CHLORIDE 0.9 % IV SOLN
10.0000 mg/kg | Freq: Once | INTRAVENOUS | Status: AC
  Administered 2017-08-25: 1120 mg via INTRAVENOUS
  Filled 2017-08-25: qty 20

## 2017-08-25 MED ORDER — HEPARIN SOD (PORK) LOCK FLUSH 100 UNIT/ML IV SOLN
500.0000 [IU] | Freq: Once | INTRAVENOUS | Status: AC
  Administered 2017-08-25: 500 [IU] via INTRAVENOUS

## 2017-08-25 MED ORDER — SODIUM CHLORIDE 0.9 % IV SOLN
Freq: Once | INTRAVENOUS | Status: AC
  Administered 2017-08-25: 10:00:00 via INTRAVENOUS
  Filled 2017-08-25: qty 1000

## 2017-08-25 MED ORDER — SODIUM CHLORIDE 0.9% FLUSH
10.0000 mL | Freq: Once | INTRAVENOUS | Status: AC
  Administered 2017-08-25: 10 mL via INTRAVENOUS
  Filled 2017-08-25: qty 10

## 2017-08-25 NOTE — Progress Notes (Signed)
Patient here for follow up. C/o pain to back 5/10.

## 2017-08-25 NOTE — Progress Notes (Signed)
[facility] Follow Up Visit  Patient Care Team: Adline Potter, MD (Inactive) as PCP - General (Family Medicine) Telford Nab, RN as Registered Nurse  REASON FOR VISIT:  Follow up for evaluation prior to maintenance immunotherapy  HISTORY OF PRESENT ILLNESS/ PERTINENT ONCOLOGY HISTORY #  Stage IIIA lung squamous cell cancer. S/p concurrent chemotherapy and Radiation.[ completed Oct 2018], started on Durvalumab in Nov 2019.  06/09/17 CT chest. scan was presented on tumor board on 06/22/2017, consensus is stable disease, with postradiation changes. Monitor # Pathology: TPS 50%. 09/22/2016 left upper lobe biopsy showed squamous cell carcinoma. Foundation one test: ordered prior to patient first visit. Showed NO EGFR, ROS1, ALK, BRAF, MET mutation.   Cancer Treatment: S/p concurrent carbo/taxol and Radiation.  Currently Durvalumab Q2 weeks. held after 5 cycles due to radiation pneumonitis. After steroid treatment, back on Durvalumab Q2weeks.   INTERVAL HISTORY Today [name] presented for follow up for assessment prior to immunotherapy maintenance for treatment of lung squamous cell cancer stage IIIA. Problems as below's. Immunotherapy, tolerates well without significant side effects. Chronic cough, uses inhaler PRN.  Stable.  Denies any shortness of breath or chest pain. Fatigue chronic stable.  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  Lightheadedness: Resolved.  Back pain: This is a new problem for him.  He reports feeling right side back pain, intermittent, gradual onset, for the past couple of months.  Currently he does not have pain.  Usually get worse at night.  No aggravating factors or alleviating factors.  Review of Systems  Constitutional: Positive for fatigue. Negative for appetite change, chills, diaphoresis, fever and unexpected weight change.  HENT:   Negative for hearing loss, lump/mass, mouth sores, nosebleeds and sore throat.    Eyes: Negative for eye problems and icterus.  Respiratory: Positive for cough. Negative for chest tightness, hemoptysis, shortness of breath and wheezing.   Cardiovascular: Negative for chest pain and leg swelling.  Gastrointestinal: Negative for abdominal distention, abdominal pain, blood in stool, diarrhea, nausea and rectal pain.  Endocrine: Negative for hot flashes.  Genitourinary: Negative for bladder incontinence, difficulty urinating, discharge, dysuria, frequency, hematuria, nocturia and pelvic pain.   Musculoskeletal: Negative for flank pain, gait problem, myalgias and neck pain.  Skin: Negative for itching, rash and wound.  Neurological: Negative for dizziness, gait problem, headaches, light-headedness, numbness and seizures.  Hematological: Negative for adenopathy. Does not bruise/bleed easily.  Psychiatric/Behavioral: Negative for confusion and decreased concentration. The patient is not nervous/anxious.       MEDICAL HISTORY: Past Medical History:  Diagnosis Date  . Enlarged prostate   . Squamous cell lung cancer, left ([place]) 08/2016    SURGICAL HISTORY: Past Surgical History:  Procedure Laterality Date  . NO PAST SURGERIES    . PORTACATH PLACEMENT Right 10/05/2016   Procedure: INSERTION PORT-A-CATH;  Surgeon: [name], MD;  Location: [facility] ORS;  Service: General;  Laterality: Right;    SOCIAL HISTORY: Social History   Socioeconomic History  . Marital status: Single    Spouse name: Not on file  . Number of children: Not on file  . Years of education: Not on file  . Highest education level: Not on file  Occupational History  . Not on file  Social Needs  . Financial resource strain: Not on file  . Food insecurity:    Worry: Not on file    Inability: Not on file  . Transportation needs:    Medical: Not on file  Non-medical: Not on file  Tobacco Use  . Smoking status: Former Smoker    Packs/day: 1.00    Years: 15.00    Pack years: 15.00    Types:  Cigarettes    Last attempt to quit: 09/28/1992    Years since quitting: 24.9  . Smokeless tobacco: Never Used  Substance and Sexual Activity  . Alcohol use: No  . Drug use: No  . Sexual activity: Not on file  Lifestyle  . Physical activity:    Days per week: Not on file    Minutes per session: Not on file  . Stress: Not on file  Relationships  . Social connections:    Talks on phone: Not on file    Gets together: Not on file    Attends religious service: Not on file    Active member of club or organization: Not on file    Attends meetings of clubs or organizations: Not on file    Relationship status: Not on file  . Intimate partner violence:    Fear of current or ex partner: Not on file    Emotionally abused: Not on file    Physically abused: Not on file    Forced sexual activity: Not on file  Other Topics Concern  . Not on file  Social History Narrative  . Not on file    FAMILY HISTORY Family History  Problem Relation Age of Onset  . Lymphoma Mother   . Diabetes Mother   . Lung cancer Sister   . Lung cancer Brother   . Lung cancer Brother     ALLERGIES:  has No Known Allergies.  MEDICATIONS:  Current Outpatient Medications  Medication Sig Dispense Refill  . acetaminophen (TYLENOL) 500 MG tablet Take 1,000 mg by mouth at bedtime.    Marland Kitchen albuterol (PROVENTIL HFA;VENTOLIN HFA) 108 (90 Base) MCG/ACT inhaler Inhale 2 puffs into the lungs every 6 (six) hours as needed for wheezing or shortness of breath. 1 Inhaler 2  . Cholecalciferol (VITAMIN D3) 2000 units capsule Take 1 capsule (2,000 Units total) by mouth daily.    . cyanocobalamin 500 MCG tablet Take 500 mcg by mouth daily.    . fluticasone (FLONASE) 50 MCG/ACT nasal spray Place 2 sprays into both nostrils daily. 16 g 6  . fluticasone (FLOVENT HFA) 44 MCG/ACT inhaler Inhale 2 puffs into the lungs 2 (two) times daily. 1 Inhaler 12  . mupirocin ointment (BACTROBAN) 2 % Apply 1 application topically 2 (two) times daily  as needed. 22 g 2  . omeprazole (PRILOSEC) 20 MG capsule Take 1 capsule (20 mg total) by mouth daily. 30 capsule 5  . terazosin (HYTRIN) 1 MG capsule Take 1 capsule (1 mg total) by mouth at bedtime.     No current facility-administered medications for this visit.     PHYSICAL EXAMINATION:  ECOG PERFORMANCE STATUS: 1 - Symptomatic but completely ambulatory   Vitals:   08/25/17 0847  BP: 131/74  Pulse: 71  Resp: 18  Temp: 98 F (36.7 C)  SpO2: 98%   Filed Weights   08/25/17 0847  Weight: 252 lb 9.6 oz (114.6 kg)  Physical Exam  Constitutional: He is oriented to person, place, and time and well-developed, well-nourished, and in no distress. No distress.  HENT:  Head: Normocephalic and atraumatic.  Right Ear: External ear normal.  Left Ear: External ear normal.  Nose: Nose normal.  Mouth/Throat: Oropharynx is clear and moist. No oropharyngeal exudate.  Eyes: Pupils are equal, round, and  reactive to light. Conjunctivae and EOM are normal. Right eye exhibits no discharge. Left eye exhibits no discharge. No scleral icterus.  Neck: Normal range of motion. Neck supple. No JVD present.  Cardiovascular: Normal rate, regular rhythm and normal heart sounds. Exam reveals no friction rub.  No murmur heard. Pulmonary/Chest: Effort normal and breath sounds normal. No respiratory distress. He has no wheezes. He has no rales. He exhibits no tenderness.    Decreased breath sounds bilaterally.  Abdominal: Soft. He exhibits no distension and no mass. There is no tenderness. There is no rebound and no guarding.  Musculoskeletal: Normal range of motion. He exhibits no edema, tenderness or deformity.  Lymphadenopathy:    He has no cervical adenopathy.  Neurological: He is alert and oriented to person, place, and time. No cranial nerve deficit. He exhibits normal muscle tone. Coordination normal.  Skin: Skin is warm and dry. No rash noted. He is not diaphoretic. No erythema. No pallor.   Psychiatric: Mood, memory, affect and judgment normal.      LABORATORY DATA: I have personally reviewed the data as listed: CBC    Component Value Date/Time   WBC 4.5 08/25/2017 0824   RBC 4.27 (L) 08/25/2017 0824   HGB 12.5 (L) 08/25/2017 0824   HCT 37.8 (L) 08/25/2017 0824   PLT 170 08/25/2017 0824   MCV 88.5 08/25/2017 0824   MCH 29.3 08/25/2017 0824   MCHC 33.1 08/25/2017 0824   RDW 14.2 08/25/2017 0824   LYMPHSABS 0.7 (L) 08/25/2017 0824   MONOABS 0.4 08/25/2017 0824   EOSABS 0.2 08/25/2017 0824   BASOSABS 0.0 08/25/2017 0824    CMP Latest Ref Rng & Units 08/25/2017 08/11/2017 07/28/2017  Glucose 70 - 99 mg/dL 121(H) 120(H) 137(H)  BUN 8 - 23 mg/dL '18 14 17  ' Creatinine 0.61 - 1.24 mg/dL 1.20 1.10 1.05  Sodium 135 - 145 mmol/L 139 139 137  Potassium 3.5 - 5.1 mmol/L 3.7 3.9 3.6  Chloride 98 - 111 mmol/L 109 110 107  CO2 22 - 32 mmol/L 21(L) 23 21(L)  Calcium 8.9 - 10.3 mg/dL 9.0 9.0 9.0  Total Protein 6.5 - 8.1 g/dL 7.4 7.3 7.3  Total Bilirubin 0.3 - 1.2 mg/dL 0.7 0.7 0.9  Alkaline Phos 38 - 126 U/L 68 68 61  AST 15 - 41 U/L '21 21 23  ' ALT 0 - 44 U/L '17 18 18     ' ASSESSMENT/PLAN Cancer Staging Squamous cell lung cancer, left ([facility]CC) Staging form: Lung, AJCC 8th Edition - Clinical stage from 09/27/2016: Stage IIIA (cT2a, cN2, cM0) - Signed by Earlie Server, MD on 09/29/2016  1. Squamous cell lung cancer, left ([place])   2. Encounter for antineoplastic immunotherapy   3. Chronic right-sided low back pain without sciatica   #Squamous cell lung cancer,  Stable on maintenance immunotherapy with durvalumab. Labs reviewed and acceptable for today's durvalumab.  Plan repeat CT chest, abdomen pelvis prior to next treatment to evaluate treatment response.  # Back pain, there is no palpable tenderness. Will obtain bone scan for further evaluation.  # Anemia stable.  Total face to face encounter time for this patient visit was 25 min. >50% of the time was  spent in counseling and  coordination of care.   Earlie Server, MD, PhD Hematology Oncology [facility] Pager- [phone] 08/25/2017

## 2017-09-07 ENCOUNTER — Encounter
Admission: RE | Admit: 2017-09-07 | Discharge: 2017-09-07 | Disposition: A | Discharge status: Home / Self Care | Payer: Medicare HMO | Source: Ambulatory Visit | Attending: Oncology | Admitting: Oncology

## 2017-09-07 ENCOUNTER — Ambulatory Visit
Admission: RE | Admit: 2017-09-07 | Discharge: 2017-09-07 | Disposition: A | Discharge status: Home / Self Care | Payer: Medicare HMO | Source: Ambulatory Visit | Attending: Oncology | Admitting: Oncology

## 2017-09-07 DIAGNOSIS — I700 Atherosclerosis of aorta: Secondary | ICD-10-CM | POA: Insufficient documentation

## 2017-09-07 DIAGNOSIS — Z923 Personal history of irradiation: Secondary | ICD-10-CM | POA: Insufficient documentation

## 2017-09-07 DIAGNOSIS — N281 Cyst of kidney, acquired: Secondary | ICD-10-CM | POA: Diagnosis not present

## 2017-09-07 DIAGNOSIS — C3492 Malignant neoplasm of unspecified part of left bronchus or lung: Secondary | ICD-10-CM

## 2017-09-07 DIAGNOSIS — J432 Centrilobular emphysema: Secondary | ICD-10-CM | POA: Diagnosis not present

## 2017-09-07 MED ORDER — IOPAMIDOL (ISOVUE-300) INJECTION 61%
100.0000 mL | Freq: Once | INTRAVENOUS | Status: AC | PRN
  Administered 2017-09-07: 100 mL via INTRAVENOUS

## 2017-09-07 MED ORDER — TECHNETIUM TC 99M MEDRONATE IV KIT
22.7460 | PACK | Freq: Once | INTRAVENOUS | Status: AC | PRN
  Administered 2017-09-07: 22.746 via INTRAVENOUS

## 2017-09-08 ENCOUNTER — Other Ambulatory Visit: Payer: Self-pay

## 2017-09-08 ENCOUNTER — Encounter: Payer: Self-pay | Admitting: Oncology

## 2017-09-08 ENCOUNTER — Inpatient Hospital Stay: Payer: Medicare HMO | Admitting: Oncology

## 2017-09-08 ENCOUNTER — Inpatient Hospital Stay: Payer: Medicare HMO

## 2017-09-08 VITALS — BP 129/77 | HR 66 | Temp 97.8°F | Resp 18 | Wt 251.7 lb

## 2017-09-08 DIAGNOSIS — D649 Anemia, unspecified: Secondary | ICD-10-CM

## 2017-09-08 DIAGNOSIS — Z9221 Personal history of antineoplastic chemotherapy: Secondary | ICD-10-CM

## 2017-09-08 DIAGNOSIS — R530 Neoplastic (malignant) related fatigue: Secondary | ICD-10-CM

## 2017-09-08 DIAGNOSIS — C3412 Malignant neoplasm of upper lobe, left bronchus or lung: Secondary | ICD-10-CM | POA: Diagnosis not present

## 2017-09-08 DIAGNOSIS — Z5112 Encounter for antineoplastic immunotherapy: Secondary | ICD-10-CM | POA: Diagnosis not present

## 2017-09-08 DIAGNOSIS — Z79899 Other long term (current) drug therapy: Secondary | ICD-10-CM

## 2017-09-08 DIAGNOSIS — Z801 Family history of malignant neoplasm of trachea, bronchus and lung: Secondary | ICD-10-CM

## 2017-09-08 DIAGNOSIS — M545 Low back pain: Secondary | ICD-10-CM

## 2017-09-08 DIAGNOSIS — C3492 Malignant neoplasm of unspecified part of left bronchus or lung: Secondary | ICD-10-CM

## 2017-09-08 DIAGNOSIS — Z923 Personal history of irradiation: Secondary | ICD-10-CM

## 2017-09-08 DIAGNOSIS — R059 Cough, unspecified: Secondary | ICD-10-CM

## 2017-09-08 DIAGNOSIS — R05 Cough: Secondary | ICD-10-CM | POA: Diagnosis not present

## 2017-09-08 LAB — COMPREHENSIVE METABOLIC PANEL
ALK PHOS: 70 U/L (ref 38–126)
ALT: 16 U/L (ref 0–44)
AST: 17 U/L (ref 15–41)
Albumin: 3.8 g/dL (ref 3.5–5.0)
Anion gap: 8 (ref 5–15)
BUN: 17 mg/dL (ref 8–23)
CALCIUM: 9 mg/dL (ref 8.9–10.3)
CO2: 22 mmol/L (ref 22–32)
CREATININE: 1 mg/dL (ref 0.61–1.24)
Chloride: 108 mmol/L (ref 98–111)
GFR calc Af Amer: >60 mL/min (ref >60)
GFR calc non Af Amer: >60 mL/min (ref >60)
GLUCOSE: 98 mg/dL (ref 70–99)
Potassium: 3.8 mmol/L (ref 3.5–5.1)
SODIUM: 138 mmol/L (ref 135–145)
Total Bilirubin: 0.7 mg/dL (ref 0.3–1.2)
Total Protein: 7.2 g/dL (ref 6.5–8.1)

## 2017-09-08 LAB — CBC WITH DIFFERENTIAL/PLATELET
BASOS PCT: 0 %
Basophils Absolute: 0 10*3/uL (ref 0–0.1)
EOS ABS: 0.4 10*3/uL (ref 0–0.7)
Eosinophils Relative: 10 %
HEMATOCRIT: 38.6 % — AB (ref 40.0–52.0)
Hemoglobin: 12.9 g/dL — ABNORMAL LOW (ref 13.0–18.0)
Lymphocytes Relative: 19 %
Lymphs Abs: 0.7 10*3/uL — ABNORMAL LOW (ref 1.0–3.6)
MCH: 29.4 pg (ref 26.0–34.0)
MCHC: 33.3 g/dL (ref 32.0–36.0)
MCV: 88.3 fL (ref 80.0–100.0)
MONO ABS: 0.4 10*3/uL (ref 0.2–1.0)
MONOS PCT: 10 %
Neutro Abs: 2.3 10*3/uL (ref 1.4–6.5)
Neutrophils Relative %: 61 %
Platelets: 165 10*3/uL (ref 150–440)
RBC: 4.38 MIL/uL — ABNORMAL LOW (ref 4.40–5.90)
RDW: 14 % (ref 11.5–14.5)
WBC: 3.8 10*3/uL (ref 3.8–10.6)

## 2017-09-08 MED ORDER — HEPARIN SOD (PORK) LOCK FLUSH 100 UNIT/ML IV SOLN
500.0000 [IU] | Freq: Once | INTRAVENOUS | Status: AC
  Administered 2017-09-08: 500 [IU] via INTRAVENOUS

## 2017-09-08 MED ORDER — SODIUM CHLORIDE 0.9 % IV SOLN
10.0000 mg/kg | Freq: Once | INTRAVENOUS | Status: AC
  Administered 2017-09-08: 1120 mg via INTRAVENOUS
  Filled 2017-09-08: qty 20

## 2017-09-08 MED ORDER — SODIUM CHLORIDE 0.9 % IV SOLN
Freq: Once | INTRAVENOUS | Status: AC
  Administered 2017-09-08: 10:00:00 via INTRAVENOUS
  Filled 2017-09-08: qty 1000

## 2017-09-08 MED ORDER — SODIUM CHLORIDE 0.9% FLUSH
10.0000 mL | Freq: Once | INTRAVENOUS | Status: AC
  Administered 2017-09-08: 10 mL via INTRAVENOUS
  Filled 2017-09-08: qty 10

## 2017-09-08 NOTE — Progress Notes (Signed)
Patient here for follow up. No concerns voiced.  °

## 2017-09-08 NOTE — Progress Notes (Signed)
Time Cancer Follow Up Visit  Patient Care Team: Adline Potter, MD (Inactive) as PCP - General (Family Medicine) Telford Nab, RN as Registered Nurse  REASON FOR VISIT:  Follow up for evaluation prior to maintenance immunotherapy  HISTORY OF PRESENT ILLNESS/ PERTINENT ONCOLOGY HISTORY #  Stage IIIA lung squamous cell cancer. S/p concurrent chemotherapy and Radiation.[ completed Oct 2018], started on Durvalumab in Nov 2019.  06/09/17 CT chest. scan was presented on tumor board on 06/22/2017, consensus is stable disease, with postradiation changes. Monitor # Pathology: TPS 50%. 09/22/2016 left upper lobe biopsy showed squamous cell carcinoma. Foundation one test: ordered prior to patient first visit. Showed NO EGFR, ROS1, ALK, BRAF, MET mutation.   Cancer Treatment: S/p concurrent carbo/taxol and Radiation.  Currently Durvalumab Q2 weeks. held after 5 cycles due to radiation pneumonitis. After steroid treatment, back on Durvalumab Q2weeks.   INTERVAL HISTORY Today [name] presented for follow up for assessment prior to immunotherapy maintenance for treatment of lung squamous cell cancer stage IIIA. Problems and compliants listed as below.   Immunotherapy,Toelrates well.  Chronic cough: he continues to have intermittent cough. Not using inhalers.  Fatigue: stable. Continues works as Administrator. Back pain: stable.  Review of Systems  Constitutional: Positive for fatigue. Negative for appetite change, chills, diaphoresis, fever and unexpected weight change.  HENT:   Negative for hearing loss, lump/mass, mouth sores, nosebleeds and sore throat.   Eyes: Negative for eye problems and icterus.  Respiratory: Positive for cough. Negative for chest tightness, hemoptysis, shortness of breath and wheezing.   Cardiovascular: Negative for chest pain and leg swelling.  Gastrointestinal: Negative for abdominal distention, abdominal pain, blood in stool, diarrhea, nausea  and rectal pain.  Endocrine: Negative for hot flashes.  Genitourinary: Negative for bladder incontinence, difficulty urinating, discharge, dysuria, frequency, hematuria, nocturia and pelvic pain.   Musculoskeletal: Negative for back pain, flank pain, gait problem, myalgias and neck pain.  Skin: Negative for itching, rash and wound.  Neurological: Negative for dizziness, gait problem, headaches, light-headedness, numbness and seizures.  Hematological: Negative for adenopathy. Does not bruise/bleed easily.  Psychiatric/Behavioral: Negative for confusion and decreased concentration. The patient is not nervous/anxious.       MEDICAL HISTORY: Past Medical History:  Diagnosis Date  . Enlarged prostate   . Squamous cell lung cancer, left ([place]) 08/2016    SURGICAL HISTORY: Past Surgical History:  Procedure Laterality Date  . NO PAST SURGERIES    . PORTACATH PLACEMENT Right 10/05/2016   Procedure: INSERTION PORT-A-CATH;  Surgeon: [name], MD;  Location: [facility] ORS;  Service: General;  Laterality: Right;    SOCIAL HISTORY: Social History   Socioeconomic History  . Marital status: Single    Spouse name: Not on file  . Number of children: Not on file  . Years of education: Not on file  . Highest education level: Not on file  Occupational History  . Not on file  Social Needs  . Financial resource strain: Not on file  . Food insecurity:    Worry: Not on file    Inability: Not on file  . Transportation needs:    Medical: Not on file    Non-medical: Not on file  Tobacco Use  . Smoking status: Former Smoker    Packs/day: 1.00    Years: 15.00    Pack years: 15.00    Types: Cigarettes    Last attempt to quit: 09/28/1992    Years since quitting: 24.9  . Smokeless tobacco:  Never Used  Substance and Sexual Activity  . Alcohol use: No  . Drug use: No  . Sexual activity: Not on file  Lifestyle  . Physical activity:    Days per week: Not on file    Minutes per session: Not on  file  . Stress: Not on file  Relationships  . Social connections:    Talks on phone: Not on file    Gets together: Not on file    Attends religious service: Not on file    Active member of club or organization: Not on file    Attends meetings of clubs or organizations: Not on file    Relationship status: Not on file  . Intimate partner violence:    Fear of current or ex partner: Not on file    Emotionally abused: Not on file    Physically abused: Not on file    Forced sexual activity: Not on file  Other Topics Concern  . Not on file  Social History Narrative  . Not on file    FAMILY HISTORY Family History  Problem Relation Age of Onset  . Lymphoma Mother   . Diabetes Mother   . Lung cancer Sister   . Lung cancer Brother   . Lung cancer Brother     ALLERGIES:  has No Known Allergies.  MEDICATIONS:  Current Outpatient Medications  Medication Sig Dispense Refill  . acetaminophen (TYLENOL) 500 MG tablet Take 1,000 mg by mouth at bedtime.    Marland Kitchen albuterol (PROVENTIL HFA;VENTOLIN HFA) 108 (90 Base) MCG/ACT inhaler Inhale 2 puffs into the lungs every 6 (six) hours as needed for wheezing or shortness of breath. 1 Inhaler 2  . Cholecalciferol (VITAMIN D3) 2000 units capsule Take 1 capsule (2,000 Units total) by mouth daily.    . cyanocobalamin 500 MCG tablet Take 500 mcg by mouth daily.    . fluticasone (FLONASE) 50 MCG/ACT nasal spray Place 2 sprays into both nostrils daily. 16 g 6  . fluticasone (FLOVENT HFA) 44 MCG/ACT inhaler Inhale 2 puffs into the lungs 2 (two) times daily. 1 Inhaler 12  . mupirocin ointment (BACTROBAN) 2 % Apply 1 application topically 2 (two) times daily as needed. 22 g 2  . omeprazole (PRILOSEC) 20 MG capsule Take 1 capsule (20 mg total) by mouth daily. 30 capsule 5  . terazosin (HYTRIN) 1 MG capsule Take 1 capsule (1 mg total) by mouth at bedtime.     No current facility-administered medications for this visit.    Facility-Administered Medications  Ordered in Other Visits  Medication Dose Route Frequency Provider Last Rate Last Dose  . heparin lock flush 100 unit/mL  500 Units Intravenous Once Earlie Server, MD        PHYSICAL EXAMINATION:  ECOG PERFORMANCE STATUS: 1 - Symptomatic but completely ambulatory   Vitals:   09/08/17 0836  BP: 129/77  Pulse: 66  Resp: 18  Temp: 97.8 F (36.6 C)  SpO2: 98%   Filed Weights   09/08/17 0836  Weight: 251 lb 11.2 oz (114.2 kg)  Physical Exam  Constitutional: He is oriented to person, place, and time and well-developed, well-nourished, and in no distress. No distress.  HENT:  Head: Normocephalic and atraumatic.  Right Ear: External ear normal.  Left Ear: External ear normal.  Nose: Nose normal.  Mouth/Throat: Oropharynx is clear and moist. No oropharyngeal exudate.  Eyes: Pupils are equal, round, and reactive to light. Conjunctivae and EOM are normal. Right eye exhibits no discharge. Left eye exhibits  no discharge. No scleral icterus.  Neck: Normal range of motion. Neck supple. No JVD present.  Cardiovascular: Normal rate, regular rhythm and normal heart sounds. Exam reveals no friction rub.  No murmur heard. Pulmonary/Chest: Effort normal. No respiratory distress. He has no wheezes. He has no rales. He exhibits no tenderness.    Decreased breath sounds bilaterally.  Abdominal: Soft. He exhibits no distension and no mass. There is no tenderness. There is no rebound and no guarding.  Musculoskeletal: Normal range of motion. He exhibits no edema, tenderness or deformity.  Lymphadenopathy:    He has no cervical adenopathy.  Neurological: He is alert and oriented to person, place, and time. No cranial nerve deficit. He exhibits normal muscle tone. Coordination normal.  Skin: Skin is warm and dry. No rash noted. He is not diaphoretic. No erythema. No pallor.  Psychiatric: Mood, memory, affect and judgment normal.      LABORATORY DATA: I have personally reviewed the data as listed: CBC     Component Value Date/Time   WBC 3.8 09/08/2017 0805   RBC 4.38 (L) 09/08/2017 0805   HGB 12.9 (L) 09/08/2017 0805   HCT 38.6 (L) 09/08/2017 0805   PLT 165 09/08/2017 0805   MCV 88.3 09/08/2017 0805   MCH 29.4 09/08/2017 0805   MCHC 33.3 09/08/2017 0805   RDW 14.0 09/08/2017 0805   LYMPHSABS 0.7 (L) 09/08/2017 0805   MONOABS 0.4 09/08/2017 0805   EOSABS 0.4 09/08/2017 0805   BASOSABS 0.0 09/08/2017 0805    CMP Latest Ref Rng & Units 09/08/2017 08/25/2017 08/11/2017  Glucose 70 - 99 mg/dL 98 121(H) 120(H)  BUN 8 - 23 mg/dL _0 Creatinine 0.61 - 1.24 mg/dL 1.00 1.20 1.10  Sodium 135 - 145 mmol/L 138 139 139  Potassium 3.5 - 5.1 mmol/L 3.8 3.7 3.9  Chloride 98 - 111 mmol/L 108 109 110  CO2 22 - 32 mmol/L 22 21(L) 23  Calcium 8.9 - 10.3 mg/dL 9.0 9.0 9.0  Total Protein 6.5 - 8.1 g/dL 7.2 7.4 7.3  Total Bilirubin 0.3 - 1.2 mg/dL 0.7 0.7 0.7  Alkaline Phos 38 - 126 U/L 70 68 68  AST 15 - 41 U/L _1 ALT 0 - 44 U/L _2 RADIOGRAPHIC STUDIES: I have personally reviewed the radiological images as listed and agreed with the findings in the report. 09/07/2017 CT chest abdomen pelvis w contrast Stable exam of the chest. No new lesion or progressive interval finding.  Post radiation changes. No metastatic disease.  Renal cysts, aortic atherosclerosis.   09/07/2017 Bone scan: no metastatic disease. Degenerative disease.   ASSESSMENT/PLAN Cancer Staging Squamous cell lung cancer, left [facility]) Staging form: Lung, AJCC 8th Edition - Clinical stage from 09/27/2016: Stage IIIA (cT2a, cN2, cM0) - Signed by Earlie Server, MD on 09/29/2016  1. Squamous cell lung cancer, left ([place])   2. Encounter for antineoplastic immunotherapy   3. Cough   #Squamous cell lung cancer,  CT images Independently reviewed by me and discussed with patient and his family members. At patient's requests, I also showed him his CT images and explained to him.  Stable disease, no new lesion or progression.  Chronic radiation changes. Tolerates immunotherapy well Labs reviewed and discussed with patient. Counts acceptable for proceeding today's Durvalumab treatment.   # Back pain, Bone scan Independently reviewed by me and discussed with patient. No bone lesions.  # Anemia stable. Continue monitor.  # Chronic cough: advise patient to  use his inhalers as instructed.   Patient mentioned to me that his PCP has left practice, and he requests to be referred to another practice.  Will refer   Total face to face encounter time for this patient visit was 25 min. >50% of the time was  spent in counseling and coordination of care.  Earlie Server, MD, PhD Hematology Oncology [facility] at [facility] Pager- [phone] 09/08/2017

## 2017-09-22 ENCOUNTER — Inpatient Hospital Stay: Payer: Medicare HMO

## 2017-09-22 ENCOUNTER — Inpatient Hospital Stay: Payer: Medicare HMO | Admitting: Oncology

## 2017-09-22 ENCOUNTER — Inpatient Hospital Stay: Payer: Medicare HMO | Attending: Oncology

## 2017-09-22 ENCOUNTER — Other Ambulatory Visit: Payer: Self-pay

## 2017-09-22 ENCOUNTER — Encounter: Payer: Self-pay | Admitting: Oncology

## 2017-09-22 VITALS — BP 107/65 | HR 65 | Temp 95.5°F | Resp 18 | Wt 249.1 lb

## 2017-09-22 DIAGNOSIS — D649 Anemia, unspecified: Secondary | ICD-10-CM | POA: Diagnosis not present

## 2017-09-22 DIAGNOSIS — R530 Neoplastic (malignant) related fatigue: Secondary | ICD-10-CM | POA: Diagnosis not present

## 2017-09-22 DIAGNOSIS — Z5112 Encounter for antineoplastic immunotherapy: Secondary | ICD-10-CM | POA: Insufficient documentation

## 2017-09-22 DIAGNOSIS — Z87891 Personal history of nicotine dependence: Secondary | ICD-10-CM | POA: Insufficient documentation

## 2017-09-22 DIAGNOSIS — M545 Low back pain: Secondary | ICD-10-CM | POA: Diagnosis not present

## 2017-09-22 DIAGNOSIS — C3492 Malignant neoplasm of unspecified part of left bronchus or lung: Secondary | ICD-10-CM | POA: Insufficient documentation

## 2017-09-22 DIAGNOSIS — R059 Cough, unspecified: Secondary | ICD-10-CM

## 2017-09-22 DIAGNOSIS — R05 Cough: Secondary | ICD-10-CM

## 2017-09-22 DIAGNOSIS — Z923 Personal history of irradiation: Secondary | ICD-10-CM | POA: Insufficient documentation

## 2017-09-22 DIAGNOSIS — G8929 Other chronic pain: Secondary | ICD-10-CM | POA: Diagnosis not present

## 2017-09-22 DIAGNOSIS — Z9221 Personal history of antineoplastic chemotherapy: Secondary | ICD-10-CM | POA: Diagnosis not present

## 2017-09-22 LAB — CBC WITH DIFFERENTIAL/PLATELET
BASOS ABS: 0 10*3/uL (ref 0–0.1)
Basophils Relative: 1 %
Eosinophils Absolute: 1 10*3/uL — ABNORMAL HIGH (ref 0–0.7)
Eosinophils Relative: 18 %
HEMATOCRIT: 38.3 % — AB (ref 40.0–52.0)
HEMOGLOBIN: 12.6 g/dL — AB (ref 13.0–18.0)
LYMPHS PCT: 17 %
Lymphs Abs: 0.9 10*3/uL — ABNORMAL LOW (ref 1.0–3.6)
MCH: 29.2 pg (ref 26.0–34.0)
MCHC: 33 g/dL (ref 32.0–36.0)
MCV: 88.2 fL (ref 80.0–100.0)
MONO ABS: 0.4 10*3/uL (ref 0.2–1.0)
MONOS PCT: 8 %
NEUTROS PCT: 56 %
Neutro Abs: 3.1 10*3/uL (ref 1.4–6.5)
Platelets: 159 10*3/uL (ref 150–440)
RBC: 4.34 MIL/uL — ABNORMAL LOW (ref 4.40–5.90)
RDW: 13.8 % (ref 11.5–14.5)
WBC: 5.4 10*3/uL (ref 3.8–10.6)

## 2017-09-22 LAB — COMPREHENSIVE METABOLIC PANEL
ALBUMIN: 3.8 g/dL (ref 3.5–5.0)
ALK PHOS: 69 U/L (ref 38–126)
ALT: 20 U/L (ref 0–44)
AST: 22 U/L (ref 15–41)
Anion gap: 9 (ref 5–15)
BUN: 17 mg/dL (ref 8–23)
CALCIUM: 9.1 mg/dL (ref 8.9–10.3)
CO2: 22 mmol/L (ref 22–32)
Chloride: 108 mmol/L (ref 98–111)
Creatinine, Ser: 1.1 mg/dL (ref 0.61–1.24)
GFR calc non Af Amer: >60 mL/min (ref >60)
GLUCOSE: 112 mg/dL — AB (ref 70–99)
POTASSIUM: 3.9 mmol/L (ref 3.5–5.1)
SODIUM: 139 mmol/L (ref 135–145)
Total Bilirubin: 0.6 mg/dL (ref 0.3–1.2)
Total Protein: 7.3 g/dL (ref 6.5–8.1)

## 2017-09-22 LAB — TSH: TSH: 1.422 u[IU]/mL (ref 0.350–4.500)

## 2017-09-22 MED ORDER — DURVALUMAB 500 MG/10ML IV SOLN
10.0000 mg/kg | Freq: Once | INTRAVENOUS | Status: AC
  Administered 2017-09-22: 1120 mg via INTRAVENOUS
  Filled 2017-09-22: qty 20

## 2017-09-22 MED ORDER — SODIUM CHLORIDE 0.9% FLUSH
10.0000 mL | INTRAVENOUS | Status: DC | PRN
  Administered 2017-09-22: 10 mL via INTRAVENOUS
  Filled 2017-09-22: qty 10

## 2017-09-22 MED ORDER — HEPARIN SOD (PORK) LOCK FLUSH 100 UNIT/ML IV SOLN
500.0000 [IU] | Freq: Once | INTRAVENOUS | Status: AC
  Administered 2017-09-22: 500 [IU] via INTRAVENOUS
  Filled 2017-09-22: qty 5

## 2017-09-22 MED ORDER — SODIUM CHLORIDE 0.9 % IV SOLN
Freq: Once | INTRAVENOUS | Status: AC
Start: 2017-09-22 — End: 2017-09-22
  Administered 2017-09-22: 09:00:00 via INTRAVENOUS
  Filled 2017-09-22: qty 1000

## 2017-09-22 NOTE — Progress Notes (Signed)
[place] Cancer Follow Up Visit  Patient Care Team: Adline Potter, MD (Inactive) as PCP - General (Family Medicine) Telford Nab, RN as Registered Nurse  REASON FOR VISIT:  Follow up for evaluation prior to maintenance immunotherapy  HISTORY OF PRESENT ILLNESS/ PERTINENT ONCOLOGY HISTORY #  Stage IIIA lung squamous cell cancer. S/p concurrent chemotherapy and Radiation.[ completed Oct 2018], started on Durvalumab in Nov 2019.  06/09/17 CT chest. scan was presented on tumor board on 06/22/2017, consensus is stable disease, with postradiation changes. Monitor # Pathology: TPS 50%. 09/22/2016 left upper lobe biopsy showed squamous cell carcinoma. Foundation one test: ordered prior to patient first visit. Showed NO EGFR, ROS1, ALK, BRAF, MET mutation.   Cancer Treatment: S/p concurrent carbo/taxol and Radiation.  Currently Durvalumab Q2 weeks. held after 5 cycles due to radiation pneumonitis. After steroid treatment, back on Durvalumab Q2weeks.   INTERVAL HISTORY Today Mr. Da Authement presented for follow up for assessment prior to immunotherapy maintenance for treatment of lung squamous cell cancer stage IIIa. Tolerates immunotherapy well. Chronic cough: Continues to have intermittent cough.  He reports not more than his baseline. Fatigue: Stable.  Continue works as a Administrator. Back pain: Still have intermittent pain on his back.  Ask what he can take for pain relief.  Review of Systems  Constitutional: Positive for fatigue. Negative for appetite change, chills, diaphoresis, fever and unexpected weight change.  HENT:   Negative for hearing loss, lump/mass, mouth sores, nosebleeds and sore throat.   Eyes: Negative for eye problems and icterus.  Respiratory: Positive for cough. Negative for chest tightness, hemoptysis, shortness of breath and wheezing.   Cardiovascular: Negative for chest pain and leg swelling.  Gastrointestinal: Negative for abdominal distention,  abdominal pain, blood in stool, diarrhea, nausea and rectal pain.  Endocrine: Negative for hot flashes.  Genitourinary: Negative for bladder incontinence, difficulty urinating, discharge, dysuria, frequency, hematuria, nocturia and pelvic pain.   Musculoskeletal: Positive for back pain. Negative for flank pain, gait problem, myalgias and neck pain.  Skin: Negative for itching, rash and wound.  Neurological: Negative for dizziness, gait problem, headaches, light-headedness, numbness and seizures.  Hematological: Negative for adenopathy. Does not bruise/bleed easily.  Psychiatric/Behavioral: Negative for confusion and decreased concentration. The patient is not nervous/anxious.       MEDICAL HISTORY: Past Medical History:  Diagnosis Date  . Enlarged prostate   . Squamous cell lung cancer, left ([place]) 08/2016    SURGICAL HISTORY: Past Surgical History:  Procedure Laterality Date  . NO PAST SURGERIES    . PORTACATH PLACEMENT Right 10/05/2016   Procedure: INSERTION PORT-A-CATH;  Surgeon: [name], MD;  Location: [facility] ORS;  Service: General;  Laterality: Right;    SOCIAL HISTORY: Social History   Socioeconomic History  . Marital status: Single    Spouse name: Not on file  . Number of children: Not on file  . Years of education: Not on file  . Highest education level: Not on file  Occupational History  . Not on file  Social Needs  . Financial resource strain: Not on file  . Food insecurity:    Worry: Not on file    Inability: Not on file  . Transportation needs:    Medical: Not on file    Non-medical: Not on file  Tobacco Use  . Smoking status: Former Smoker    Packs/day: 1.00    Years: 15.00    Pack years: 15.00    Types: Cigarettes    Last attempt  to quit: 09/28/1992    Years since quitting: 25.0  . Smokeless tobacco: Never Used  Substance and Sexual Activity  . Alcohol use: No  . Drug use: No  . Sexual activity: Not on file  Lifestyle  . Physical activity:     Days per week: Not on file    Minutes per session: Not on file  . Stress: Not on file  Relationships  . Social connections:    Talks on phone: Not on file    Gets together: Not on file    Attends religious service: Not on file    Active member of club or organization: Not on file    Attends meetings of clubs or organizations: Not on file    Relationship status: Not on file  . Intimate partner violence:    Fear of current or ex partner: Not on file    Emotionally abused: Not on file    Physically abused: Not on file    Forced sexual activity: Not on file  Other Topics Concern  . Not on file  Social History Narrative  . Not on file    FAMILY HISTORY Family History  Problem Relation Age of Onset  . Lymphoma Mother   . Diabetes Mother   . Lung cancer Sister   . Lung cancer Brother   . Lung cancer Brother     ALLERGIES:  has No Known Allergies.  MEDICATIONS:  Current Outpatient Medications  Medication Sig Dispense Refill  . acetaminophen (TYLENOL) 500 MG tablet Take 1,000 mg by mouth every 4 (four) hours as needed.     Marland Kitchen albuterol (PROVENTIL HFA;VENTOLIN HFA) 108 (90 Base) MCG/ACT inhaler Inhale 2 puffs into the lungs every 6 (six) hours as needed for wheezing or shortness of breath. 1 Inhaler 2  . Cholecalciferol (VITAMIN D3) 2000 units capsule Take 1 capsule (2,000 Units total) by mouth daily.    . cyanocobalamin 500 MCG tablet Take 500 mcg by mouth daily.    . fluticasone (FLONASE) 50 MCG/ACT nasal spray Place 2 sprays into both nostrils daily. (Patient taking differently: Place 2 sprays into both nostrils daily as needed. ) 16 g 6  . fluticasone (FLOVENT HFA) 44 MCG/ACT inhaler Inhale 2 puffs into the lungs 2 (two) times daily. (Patient not taking: Reported on 09/08/2017) 1 Inhaler 12  . mupirocin ointment (BACTROBAN) 2 % Apply 1 application topically 2 (two) times daily as needed. 22 g 2  . omeprazole (PRILOSEC) 20 MG capsule Take 1 capsule (20 mg total) by mouth daily.  (Patient not taking: Reported on 09/08/2017) 30 capsule 5  . terazosin (HYTRIN) 1 MG capsule Take 1 capsule (1 mg total) by mouth at bedtime.     No current facility-administered medications for this visit.    Facility-Administered Medications Ordered in Other Visits  Medication Dose Route Frequency Provider Last Rate Last Dose  . heparin lock flush 100 unit/mL  500 Units Intravenous Once Earlie Server, MD      . sodium chloride flush (NS) 0.9 % injection 10 mL  10 mL Intravenous PRN Earlie Server, MD   10 mL at 09/22/17 0818    PHYSICAL EXAMINATION:  ECOG PERFORMANCE STATUS: 1 - Symptomatic but completely ambulatory   Vitals:   09/22/17 0908  BP: 107/65  Pulse: 65  Resp: 18  Temp: (!) 95.5 F (35.3 C)  SpO2: 98%   Filed Weights   09/22/17 0908  Weight: 249 lb 2 oz (113 kg)  Physical Exam  Constitutional: He is  oriented to person, place, and time and well-developed, well-nourished, and in no distress. No distress.  HENT:  Head: Normocephalic and atraumatic.  Right Ear: External ear normal.  Left Ear: External ear normal.  Nose: Nose normal.  Mouth/Throat: Oropharynx is clear and moist. No oropharyngeal exudate.  Eyes: Pupils are equal, round, and reactive to light. Conjunctivae and EOM are normal. Right eye exhibits no discharge. Left eye exhibits no discharge. No scleral icterus.  Neck: Normal range of motion. Neck supple. No JVD present.  Cardiovascular: Normal rate, regular rhythm and normal heart sounds. Exam reveals no friction rub.  No murmur heard. Pulmonary/Chest: Effort normal. No respiratory distress. He has no wheezes. He has no rales. He exhibits no tenderness.    Decreased breath sounds bilaterally.  Abdominal: Soft. He exhibits no distension and no mass. There is no tenderness. There is no rebound and no guarding.  Musculoskeletal: Normal range of motion. He exhibits no edema, tenderness or deformity.  Lymphadenopathy:    He has no cervical adenopathy.  Neurological:  He is alert and oriented to person, place, and time. No cranial nerve deficit. He exhibits normal muscle tone. Coordination normal.  Skin: Skin is warm and dry. No rash noted. He is not diaphoretic. No erythema. No pallor.  Psychiatric: Mood, memory, affect and judgment normal.      LABORATORY DATA: I have personally reviewed the data as listed: CBC    Component Value Date/Time   WBC 5.4 09/22/2017 0826   RBC 4.34 (L) 09/22/2017 0826   HGB 12.6 (L) 09/22/2017 0826   HCT 38.3 (L) 09/22/2017 0826   PLT 159 09/22/2017 0826   MCV 88.2 09/22/2017 0826   MCH 29.2 09/22/2017 0826   MCHC 33.0 09/22/2017 0826   RDW 13.8 09/22/2017 0826   LYMPHSABS 0.9 (L) 09/22/2017 0826   MONOABS 0.4 09/22/2017 0826   EOSABS 1.0 (H) 09/22/2017 0826   BASOSABS 0.0 09/22/2017 0826    CMP Latest Ref Rng & Units 09/22/2017 09/08/2017 08/25/2017  Glucose 70 - 99 mg/dL 112(H) 98 121(H)  BUN 8 - 23 mg/dL _0 Creatinine 0.61 - 1.24 mg/dL 1.10 1.00 1.20  Sodium 135 - 145 mmol/L 139 138 139  Potassium 3.5 - 5.1 mmol/L 3.9 3.8 3.7  Chloride 98 - 111 mmol/L 108 108 109  CO2 22 - 32 mmol/L 22 22 21(L)  Calcium 8.9 - 10.3 mg/dL 9.1 9.0 9.0  Total Protein 6.5 - 8.1 g/dL 7.3 7.2 7.4  Total Bilirubin 0.3 - 1.2 mg/dL 0.6 0.7 0.7  Alkaline Phos 38 - 126 U/L 69 70 68  AST 15 - 41 U/L _1 ALT 0 - 44 U/L _2 RADIOGRAPHIC STUDIES: I have personally reviewed the radiological images as listed and agreed with the findings in the report. 09/07/2017 CT chest abdomen pelvis w contrast Stable exam of the chest. No new lesion or progressive interval finding.  Post radiation changes. No metastatic disease.  Renal cysts, aortic atherosclerosis.   09/07/2017 Bone scan: no metastatic disease. Degenerative disease.   ASSESSMENT/PLAN Cancer Staging Squamous cell lung cancer, left [facility]) Staging form: Lung, AJCC 8th Edition - Clinical stage from 09/27/2016: Stage IIIA (cT2a, cN2, cM0) - Signed by Earlie Server, MD on  09/29/2016  1. Squamous cell lung cancer, left ([place])   2. Encounter for antineoplastic immunotherapy   3. Chronic right-sided low back pain without sciatica   4. Cough   #Squamous cell lung cancer, stable disease. Labs reviewed and  discussed with patient.  He tolerates immunotherapy well.  Counts acceptable for proceeding today's durvalumab treatment.  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  Recommend that patient can use Tylenol alternate with Aleve for pain relief.  Also recommend patient to start physical therapy.  Patient declined physical therapy referral.  # Anemia. Stable. Monitor.  # Chronic cough: Advised patient to continue inhaler as needed.  He voices understanding.  Total face to face encounter time for this patient visit was 25 min. >50% of the time was  spent in counseling and coordination of care.   Earlie Server, MD, PhD Hematology Oncology [facility] at [facility] Pager- [phone] 09/22/2017

## 2017-09-22 NOTE — Progress Notes (Signed)
Patient here today for follow up and durvalumab treatment

## 2017-10-03 ENCOUNTER — Other Ambulatory Visit: Payer: Self-pay | Admitting: Family Medicine

## 2017-10-06 ENCOUNTER — Inpatient Hospital Stay: Payer: Medicare HMO

## 2017-10-06 ENCOUNTER — Ambulatory Visit (INDEPENDENT_AMBULATORY_CARE_PROVIDER_SITE_OTHER): Payer: Medicare HMO | Admitting: Internal Medicine

## 2017-10-06 ENCOUNTER — Other Ambulatory Visit: Payer: Self-pay | Admitting: Internal Medicine

## 2017-10-06 ENCOUNTER — Encounter: Payer: Self-pay | Admitting: Internal Medicine

## 2017-10-06 VITALS — BP 114/80 | HR 75 | Temp 98.1°F | Ht 71.0 in | Wt 251.0 lb

## 2017-10-06 VITALS — BP 144/76 | HR 73 | Temp 96.0°F | Resp 18 | Wt 253.4 lb

## 2017-10-06 DIAGNOSIS — Z5112 Encounter for antineoplastic immunotherapy: Secondary | ICD-10-CM

## 2017-10-06 DIAGNOSIS — C3492 Malignant neoplasm of unspecified part of left bronchus or lung: Secondary | ICD-10-CM

## 2017-10-06 DIAGNOSIS — N400 Enlarged prostate without lower urinary tract symptoms: Secondary | ICD-10-CM

## 2017-10-06 DIAGNOSIS — E785 Hyperlipidemia, unspecified: Secondary | ICD-10-CM

## 2017-10-06 DIAGNOSIS — J309 Allergic rhinitis, unspecified: Secondary | ICD-10-CM

## 2017-10-06 DIAGNOSIS — E782 Mixed hyperlipidemia: Secondary | ICD-10-CM | POA: Diagnosis not present

## 2017-10-06 DIAGNOSIS — L739 Follicular disorder, unspecified: Secondary | ICD-10-CM

## 2017-10-06 DIAGNOSIS — E559 Vitamin D deficiency, unspecified: Secondary | ICD-10-CM

## 2017-10-06 DIAGNOSIS — R7303 Prediabetes: Secondary | ICD-10-CM | POA: Diagnosis not present

## 2017-10-06 LAB — CBC WITH DIFFERENTIAL/PLATELET
BASOS ABS: 0 10*3/uL (ref 0–0.1)
BASOS PCT: 1 %
EOS PCT: 16 %
Eosinophils Absolute: 0.8 10*3/uL — ABNORMAL HIGH (ref 0–0.7)
HEMATOCRIT: 39.5 % — AB (ref 40.0–52.0)
Hemoglobin: 13 g/dL (ref 13.0–18.0)
LYMPHS PCT: 15 %
Lymphs Abs: 0.7 10*3/uL — ABNORMAL LOW (ref 1.0–3.6)
MCH: 29 pg (ref 26.0–34.0)
MCHC: 32.9 g/dL (ref 32.0–36.0)
MCV: 88 fL (ref 80.0–100.0)
MONO ABS: 0.4 10*3/uL (ref 0.2–1.0)
MONOS PCT: 8 %
Neutro Abs: 2.9 10*3/uL (ref 1.4–6.5)
Neutrophils Relative %: 60 %
PLATELETS: 166 10*3/uL (ref 150–440)
RBC: 4.49 MIL/uL (ref 4.40–5.90)
RDW: 14.1 % (ref 11.5–14.5)
WBC: 4.8 10*3/uL (ref 3.8–10.6)

## 2017-10-06 LAB — COMPREHENSIVE METABOLIC PANEL
ALBUMIN: 3.8 g/dL (ref 3.5–5.0)
ALT: 16 U/L (ref 0–44)
AST: 18 U/L (ref 15–41)
Alkaline Phosphatase: 67 U/L (ref 38–126)
Anion gap: 7 (ref 5–15)
BILIRUBIN TOTAL: 0.8 mg/dL (ref 0.3–1.2)
BUN: 18 mg/dL (ref 8–23)
CALCIUM: 9.1 mg/dL (ref 8.9–10.3)
CO2: 24 mmol/L (ref 22–32)
Chloride: 110 mmol/L (ref 98–111)
Creatinine, Ser: 1.23 mg/dL (ref 0.61–1.24)
GFR calc Af Amer: >60 mL/min (ref >60)
GFR calc non Af Amer: 57 mL/min — ABNORMAL LOW (ref >60)
GLUCOSE: 126 mg/dL — AB (ref 70–99)
Potassium: 3.9 mmol/L (ref 3.5–5.1)
SODIUM: 141 mmol/L (ref 135–145)
TOTAL PROTEIN: 7.4 g/dL (ref 6.5–8.1)

## 2017-10-06 LAB — TSH: TSH: 1.479 u[IU]/mL (ref 0.350–4.500)

## 2017-10-06 MED ORDER — SODIUM CHLORIDE 0.9 % IV SOLN
10.0000 mg/kg | Freq: Once | INTRAVENOUS | Status: AC
  Administered 2017-10-06: 1120 mg via INTRAVENOUS
  Filled 2017-10-06: qty 20

## 2017-10-06 MED ORDER — FLUTICASONE PROPIONATE 50 MCG/ACT NA SUSP: 2.0000 | Freq: Every day | NASAL | 6 refills | Status: DC

## 2017-10-06 MED ORDER — MUPIROCIN 2 % EX OINT: 1.0000 "application " | TOPICAL_OINTMENT | Freq: Two times a day (BID) | CUTANEOUS | 2 refills | Status: AC | PRN

## 2017-10-06 MED ORDER — SODIUM CHLORIDE 0.9% FLUSH
10.0000 mL | INTRAVENOUS | Status: DC | PRN
  Administered 2017-10-06: 10 mL
  Filled 2017-10-06: qty 10

## 2017-10-06 MED ORDER — SODIUM CHLORIDE 0.9 % IV SOLN
Freq: Once | INTRAVENOUS | Status: AC
  Administered 2017-10-06: 09:00:00 via INTRAVENOUS
  Filled 2017-10-06: qty 250

## 2017-10-06 MED ORDER — HEPARIN SOD (PORK) LOCK FLUSH 100 UNIT/ML IV SOLN
500.0000 [IU] | Freq: Once | INTRAVENOUS | Status: AC | PRN
  Administered 2017-10-06: 500 [IU]
  Filled 2017-10-06: qty 5

## 2017-10-06 MED ORDER — BENZONATATE 100 MG PO CAPS: 100.0000 mg | ORAL_CAPSULE | Freq: Three times a day (TID) | ORAL | 0 refills | Status: DC | PRN

## 2017-10-06 NOTE — Progress Notes (Signed)
Date:  10/06/2017   Name:  [name]   DOB:  Oct 07, 1945   MRN:  [id number]   Chief Complaint: Cough (x 1 month. Dry cough during the day. Clear production. ); Hyperlipidemia; and Rash (Needs refill on bactoban cream for hair bumps on neck. ) [facility] -  Stage IIIA lung squamous cell cancer. S/p concurrent chemotherapy and Radiation.[ completed Oct 2018], started on Durvalumab in Nov 2019. Followed by Oncology. He complains of a dry cough, intermittent, clear phlegm. He has an inhaler but does not use it much.   Diabetes  He presents for his follow-up diabetic visit. Diabetes type: prediabetes. Pertinent negatives for hypoglycemia include no dizziness or headaches. Pertinent negatives for diabetes include no chest pain. Current diabetic treatment includes diet.  Benign Prostatic Hypertrophy  This is a chronic problem. Irritative symptoms include nocturia. Irritative symptoms do not include frequency or urgency. Obstructive symptoms include a slower stream. Obstructive symptoms do not include incomplete emptying. Pertinent negatives include no chills or dysuria. Past treatments include terazosin. The treatment provided moderate relief.  Hyperlipidemia  This is a chronic problem. The problem is uncontrolled. Recent lipid tests were reviewed and are high. Pertinent negatives include no chest pain or shortness of breath.  Folliculitis - uses bactroban and needs refill.   Lab Results  Component Value Date   CHOL 222 (H) 01/24/2017   HDL 42 01/24/2017   LDLCALC 153 (H) 01/24/2017   TRIG 137 01/24/2017   CHOLHDL 5.3 (H) 01/24/2017   Lab Results  Component Value Date   CREATININE 1.23 10/06/2017   BUN 18 10/06/2017   NA 141 10/06/2017   K 3.9 10/06/2017   CL 110 10/06/2017   CO2 24 10/06/2017   Lab Results  Component Value Date   WBC 4.8 10/06/2017   HGB 13.0 10/06/2017   HCT 39.5 (L) 10/06/2017   MCV 88.0 10/06/2017   PLT 166 10/06/2017     Review of Systems    Constitutional: Negative for chills, fever and unexpected weight change.  HENT: Negative for ear pain, postnasal drip, sinus pressure, sore throat and trouble swallowing.   Eyes: Negative for visual disturbance.  Respiratory: Positive for cough. Negative for chest tightness, shortness of breath and wheezing.   Cardiovascular: Negative for chest pain and palpitations.  Gastrointestinal: Negative for abdominal pain.  Genitourinary: Positive for nocturia. Negative for difficulty urinating, dysuria, frequency, incomplete emptying and urgency.  Neurological: Negative for dizziness, light-headedness and headaches.    Patient Active Problem List   Diagnosis Date Noted  . Mixed hyperlipidemia 10/06/2017  . Allergic rhinitis 10/06/2017  . Encounter for antineoplastic immunotherapy 02/10/2017  . Prediabetes 01/25/2017  . BPH (benign prostatic hyperplasia) 10/21/2016  . Squamous cell lung cancer, left ([place]) 09/07/2016  . B12 deficiency 09/07/2016  . Vitamin D deficiency 09/07/2016  . Obesity (BMI 30-39.9) 09/07/2016  . Colon polyps 09/07/2016    No Known Allergies  Past Surgical History:  Procedure Laterality Date  . NO PAST SURGERIES    . PORTACATH PLACEMENT Right 10/05/2016   Procedure: INSERTION PORT-A-CATH;  Surgeon: [name], MD;  Location: [facility] ORS;  Service: General;  Laterality: Right;    Social History   Tobacco Use  . Smoking status: Former Smoker    Packs/day: 1.00    Years: 15.00    Pack years: 15.00    Types: Cigarettes    Last attempt to quit: 09/28/1992    Years since quitting: 25.0  . Smokeless tobacco: Never Used  Substance Use Topics  . Alcohol use: No  . Drug use: No     Medication list has been reviewed and updated.  Current Meds  Medication Sig  . acetaminophen (TYLENOL) 500 MG tablet Take 1,000 mg by mouth every 4 (four) hours as needed.   Marland Kitchen albuterol (PROVENTIL HFA;VENTOLIN HFA) 108 (90 Base) MCG/ACT inhaler Inhale 2 puffs into the lungs  every 6 (six) hours as needed for wheezing or shortness of breath.  . Cholecalciferol (VITAMIN D3) 2000 units capsule Take 1 capsule (2,000 Units total) by mouth daily.  . cyanocobalamin 500 MCG tablet Take 500 mcg by mouth daily.  . fluticasone (FLONASE) 50 MCG/ACT nasal spray Place 2 sprays into both nostrils daily. (Patient taking differently: Place 2 sprays into both nostrils daily as needed. )  . fluticasone (FLOVENT HFA) 44 MCG/ACT inhaler Inhale 2 puffs into the lungs 2 (two) times daily.  Marland Kitchen omeprazole (PRILOSEC) 20 MG capsule Take 1 capsule (20 mg total) by mouth daily.  Marland Kitchen terazosin (HYTRIN) 1 MG capsule Take 1 capsule (1 mg total) by mouth at bedtime.    PHQ 2/9 Scores 05/26/2017 09/07/2016  PHQ - 2 Score 0 0    Physical Exam  Constitutional: He is oriented to person, place, and time. He appears well-developed. No distress.  HENT:  Head: Normocephalic and atraumatic.  Right Ear: Tympanic membrane and ear canal normal.  Left Ear: Tympanic membrane and ear canal normal.  Nose: Right sinus exhibits no maxillary sinus tenderness. Left sinus exhibits no maxillary sinus tenderness.  Mouth/Throat: Oropharynx is clear and moist. No posterior oropharyngeal edema or posterior oropharyngeal erythema.  Neck: Normal range of motion. Neck supple.  Cardiovascular: Normal rate, regular rhythm and normal heart sounds.  Pulmonary/Chest: Effort normal and breath sounds normal. No respiratory distress. He has no wheezes.  Musculoskeletal: He exhibits no edema.  Neurological: He is alert and oriented to person, place, and time.  Skin: Skin is warm and dry. No rash noted.  Psychiatric: He has a normal mood and affect. His behavior is normal. Thought content normal.  Nursing note and vitals reviewed.   BP 114/80 (BP Location: Left Arm, Patient Position: Sitting, Cuff Size: Large)   Pulse 75   Temp 98.1 F (36.7 C) (Oral)   Ht 5\' 11"  (1.803 m)   Wt 251 lb (113.9 kg)   SpO2 97%   BMI 35.01 kg/m    Assessment and Plan: 1. Allergic rhinitis, unspecified seasonality, unspecified trigger - fluticasone (FLONASE) 50 MCG/ACT nasal spray; Place 2 sprays into both nostrils daily.  Dispense: 16 g; Refill: 6 - benzonatate (TESSALON PERLES) 100 MG capsule; Take 1 capsule (100 mg total) by mouth 3 (three) times daily as needed for cough.  Dispense: 30 capsule; Refill: 0  2. Benign prostatic hyperplasia without lower urinary tract symptoms Continue terazosin  3. Mixed hyperlipidemia Check labs and advise - Lipid panel  4. Prediabetes - Hemoglobin A1c  5. Squamous cell lung cancer, left ([facility]) On maintenance therapy  6. Folliculitis - mupirocin ointment (BACTROBAN) 2 %; Apply 1 application topically 2 (two) times daily as needed.  Dispense: 22 g; Refill: 2   Meds ordered this encounter  Medications  . mupirocin ointment (BACTROBAN) 2 %    Sig: Apply 1 application topically 2 (two) times daily as needed.    Dispense:  22 g    Refill:  2  . fluticasone (FLONASE) 50 MCG/ACT nasal spray    Sig: Place 2 sprays into both nostrils daily.  Dispense:  16 g    Refill:  6  . benzonatate (TESSALON PERLES) 100 MG capsule    Sig: Take 1 capsule (100 mg total) by mouth 3 (three) times daily as needed for cough.    Dispense:  30 capsule    Refill:  0    Partially dictated using Editor, commissioning. Any errors are unintentional.  Halina Maidens, MD [facility]  10/06/2017

## 2017-10-06 NOTE — Patient Instructions (Signed)
Please request labs from last [place] visit and a record of immunizations.

## 2017-10-07 LAB — LIPID PANEL
CHOL/HDL RATIO: 4.4 ratio (ref 0.0–5.0)
CHOLESTEROL TOTAL: 181 mg/dL (ref 100–199)
HDL: 41 mg/dL (ref >39)
LDL CALC: 126 mg/dL — AB (ref 0–99)
TRIGLYCERIDES: 70 mg/dL (ref 0–149)
VLDL Cholesterol Cal: 14 mg/dL (ref 5–40)

## 2017-10-07 LAB — HEMOGLOBIN A1C
Est. average glucose Bld gHb Est-mCnc: 117 mg/dL
Hgb A1c MFr Bld: 5.7 % — ABNORMAL HIGH (ref 4.8–5.6)

## 2017-10-20 ENCOUNTER — Inpatient Hospital Stay: Payer: Medicare HMO | Admitting: Oncology

## 2017-10-20 ENCOUNTER — Other Ambulatory Visit: Payer: Self-pay

## 2017-10-20 ENCOUNTER — Inpatient Hospital Stay: Payer: Medicare HMO

## 2017-10-20 ENCOUNTER — Ambulatory Visit
Admission: RE | Admit: 2017-10-20 | Discharge: 2017-10-20 | Disposition: A | Discharge status: Home / Self Care | Payer: Medicare HMO | Source: Ambulatory Visit | Attending: Oncology | Admitting: Oncology

## 2017-10-20 ENCOUNTER — Inpatient Hospital Stay: Payer: Medicare HMO | Attending: Oncology

## 2017-10-20 ENCOUNTER — Encounter: Payer: Self-pay | Admitting: Oncology

## 2017-10-20 VITALS — BP 119/75 | HR 68 | Temp 96.4°F | Resp 18 | Wt 253.0 lb

## 2017-10-20 DIAGNOSIS — Z9221 Personal history of antineoplastic chemotherapy: Secondary | ICD-10-CM

## 2017-10-20 DIAGNOSIS — R059 Cough, unspecified: Secondary | ICD-10-CM

## 2017-10-20 DIAGNOSIS — C3492 Malignant neoplasm of unspecified part of left bronchus or lung: Secondary | ICD-10-CM

## 2017-10-20 DIAGNOSIS — Z5112 Encounter for antineoplastic immunotherapy: Secondary | ICD-10-CM | POA: Insufficient documentation

## 2017-10-20 DIAGNOSIS — C3412 Malignant neoplasm of upper lobe, left bronchus or lung: Secondary | ICD-10-CM | POA: Insufficient documentation

## 2017-10-20 DIAGNOSIS — Z87891 Personal history of nicotine dependence: Secondary | ICD-10-CM

## 2017-10-20 DIAGNOSIS — R05 Cough: Secondary | ICD-10-CM | POA: Insufficient documentation

## 2017-10-20 DIAGNOSIS — R918 Other nonspecific abnormal finding of lung field: Secondary | ICD-10-CM | POA: Insufficient documentation

## 2017-10-20 DIAGNOSIS — D72810 Lymphocytopenia: Secondary | ICD-10-CM | POA: Diagnosis not present

## 2017-10-20 DIAGNOSIS — D649 Anemia, unspecified: Secondary | ICD-10-CM | POA: Insufficient documentation

## 2017-10-20 DIAGNOSIS — Z95828 Presence of other vascular implants and grafts: Secondary | ICD-10-CM

## 2017-10-20 DIAGNOSIS — Z923 Personal history of irradiation: Secondary | ICD-10-CM | POA: Insufficient documentation

## 2017-10-20 DIAGNOSIS — Z801 Family history of malignant neoplasm of trachea, bronchus and lung: Secondary | ICD-10-CM | POA: Diagnosis not present

## 2017-10-20 DIAGNOSIS — R530 Neoplastic (malignant) related fatigue: Secondary | ICD-10-CM | POA: Diagnosis not present

## 2017-10-20 DIAGNOSIS — Z79899 Other long term (current) drug therapy: Secondary | ICD-10-CM

## 2017-10-20 LAB — CBC WITH DIFFERENTIAL/PLATELET
BASOS PCT: 1 %
Basophils Absolute: 0 10*3/uL (ref 0–0.1)
EOS ABS: 0.4 10*3/uL (ref 0–0.7)
Eosinophils Relative: 9 %
HEMATOCRIT: 38 % — AB (ref 40.0–52.0)
HEMOGLOBIN: 12.7 g/dL — AB (ref 13.0–18.0)
Lymphocytes Relative: 18 %
Lymphs Abs: 0.8 10*3/uL — ABNORMAL LOW (ref 1.0–3.6)
MCH: 29.2 pg (ref 26.0–34.0)
MCHC: 33.4 g/dL (ref 32.0–36.0)
MCV: 87.4 fL (ref 80.0–100.0)
Monocytes Absolute: 0.5 10*3/uL (ref 0.2–1.0)
Monocytes Relative: 10 %
NEUTROS PCT: 62 %
Neutro Abs: 2.9 10*3/uL (ref 1.4–6.5)
Platelets: 175 10*3/uL (ref 150–440)
RBC: 4.35 MIL/uL — AB (ref 4.40–5.90)
RDW: 14 % (ref 11.5–14.5)
WBC: 4.7 10*3/uL (ref 3.8–10.6)

## 2017-10-20 LAB — COMPREHENSIVE METABOLIC PANEL
ALK PHOS: 67 U/L (ref 38–126)
ALT: 16 U/L (ref 0–44)
AST: 18 U/L (ref 15–41)
Albumin: 3.6 g/dL (ref 3.5–5.0)
Anion gap: 6 (ref 5–15)
BUN: 17 mg/dL (ref 8–23)
CALCIUM: 8.7 mg/dL — AB (ref 8.9–10.3)
CHLORIDE: 109 mmol/L (ref 98–111)
CO2: 23 mmol/L (ref 22–32)
CREATININE: 0.98 mg/dL (ref 0.61–1.24)
Glucose, Bld: 100 mg/dL — ABNORMAL HIGH (ref 70–99)
Potassium: 4 mmol/L (ref 3.5–5.1)
SODIUM: 138 mmol/L (ref 135–145)
Total Bilirubin: 0.8 mg/dL (ref 0.3–1.2)
Total Protein: 7.1 g/dL (ref 6.5–8.1)

## 2017-10-20 MED ORDER — DEXTROMETHORPHAN-GUAIFENESIN 10-100 MG/5ML PO SYRP: 5.0000 mL | ORAL_SOLUTION | Freq: Two times a day (BID) | ORAL | 1 refills | Status: DC

## 2017-10-20 MED ORDER — SODIUM CHLORIDE 0.9% FLUSH
10.0000 mL | INTRAVENOUS | Status: DC | PRN
  Filled 2017-10-20: qty 10

## 2017-10-20 MED ORDER — SODIUM CHLORIDE 0.9 % IV SOLN
10.0000 mg/kg | Freq: Once | INTRAVENOUS | Status: AC
  Administered 2017-10-20: 1120 mg via INTRAVENOUS
  Filled 2017-10-20: qty 2.4

## 2017-10-20 MED ORDER — HEPARIN SOD (PORK) LOCK FLUSH 100 UNIT/ML IV SOLN: 500.0000 [IU] | Freq: Once | INTRAVENOUS | Status: DC | PRN

## 2017-10-20 MED ORDER — SODIUM CHLORIDE 0.9 % IV SOLN
Freq: Once | INTRAVENOUS | Status: AC
  Administered 2017-10-20: 09:00:00 via INTRAVENOUS
  Filled 2017-10-20: qty 250

## 2017-10-20 MED ORDER — HEPARIN SOD (PORK) LOCK FLUSH 100 UNIT/ML IV SOLN
500.0000 [IU] | Freq: Once | INTRAVENOUS | Status: AC
  Administered 2017-10-20: 500 [IU] via INTRAVENOUS
  Filled 2017-10-20: qty 5

## 2017-10-20 MED ORDER — SODIUM CHLORIDE 0.9% FLUSH
10.0000 mL | Freq: Once | INTRAVENOUS | Status: AC
  Administered 2017-10-20: 10 mL via INTRAVENOUS
  Filled 2017-10-20: qty 10

## 2017-10-20 NOTE — Progress Notes (Signed)
Patient here for follow up. He has a dry cough that has been going on "for a while." Sometimes coughing makes him feel like he cant catch his breath.

## 2017-10-20 NOTE — Progress Notes (Signed)
[facility] Follow Up Visit  Patient Care Team: Glean Hess, MD as PCP - General (Internal Medicine) Earlie Server, MD as Consulting Physician (Oncology)  REASON FOR VISIT:  Follow up for evaluation prior to maintenance immunotherapy  HISTORY OF PRESENT ILLNESS/ PERTINENT ONCOLOGY HISTORY #  Stage IIIA lung squamous cell cancer. S/p concurrent chemotherapy and Radiation.[ completed Oct 2018], started on Durvalumab in Nov 2019.  06/09/17 CT chest. scan was presented on tumor board on 06/22/2017, consensus is stable disease, with postradiation changes. Monitor # Pathology: TPS 50%. 09/22/2016 left upper lobe biopsy showed squamous cell carcinoma. Foundation one test: ordered prior to patient first visit. Showed NO EGFR, ROS1, ALK, BRAF, MET mutation.   Cancer Treatment: S/p concurrent carbo/taxol and Radiation.  Currently Durvalumab Q2 weeks. held after 5 cycles due to radiation pneumonitis. After steroid treatment, back on Durvalumab Q2weeks.   INTERVAL HISTORY Today [name] presented for follow up for assessment prior to immunotherapy maintenance for treatment of lung squamous cell cancer stage IIIa. Status post concurrent chemoradiation.  Currently on immunotherapy maintenance. Today his main complaints were cough.  In the past he has had radiation pneumonitis was successfully treated with steroid treatment and immunotherapy was able to resumed.  And he has tolerated until now.  He had occasionally some cough but recently got worse since 2 weeks ago.  Mostly at night.  Nonproductive not associated with chest pain, fever or chills. Fatigue: Stable at baseline. Back pain: Stable as baseline.  Review of Systems  Constitutional: Positive for fatigue. Negative for appetite change, chills, diaphoresis, fever and unexpected weight change.  HENT:   Negative for hearing loss, lump/mass, mouth sores, nosebleeds and sore throat.   Eyes: Negative for eye problems and  icterus.  Respiratory: Positive for cough. Negative for chest tightness, hemoptysis, shortness of breath and wheezing.   Cardiovascular: Negative for chest pain and leg swelling.  Gastrointestinal: Negative for abdominal distention, abdominal pain, blood in stool, diarrhea, nausea and rectal pain.  Endocrine: Negative for hot flashes.  Genitourinary: Negative for bladder incontinence, difficulty urinating, discharge, dysuria, frequency, hematuria, nocturia and pelvic pain.   Musculoskeletal: Positive for back pain. Negative for flank pain, gait problem, myalgias and neck pain.  Skin: Negative for itching, rash and wound.  Neurological: Negative for dizziness, gait problem, headaches, light-headedness, numbness and seizures.  Hematological: Negative for adenopathy. Does not bruise/bleed easily.  Psychiatric/Behavioral: Negative for confusion and decreased concentration. The patient is not nervous/anxious.       MEDICAL HISTORY: Past Medical History:  Diagnosis Date  . Enlarged prostate   . Squamous cell lung cancer, left ([place]) 08/2016    SURGICAL HISTORY: Past Surgical History:  Procedure Laterality Date  . NO PAST SURGERIES    . PORTACATH PLACEMENT Right 10/05/2016   Procedure: INSERTION PORT-A-CATH;  Surgeon: [name], MD;  Location: [facility] ORS;  Service: General;  Laterality: Right;    SOCIAL HISTORY: Social History   Socioeconomic History  . Marital status: Single    Spouse name: Not on file  . Number of children: Not on file  . Years of education: Not on file  . Highest education level: Not on file  Occupational History  . Not on file  Social Needs  . Financial resource strain: Not on file  . Food insecurity:    Worry: Not on file    Inability: Not on file  . Transportation needs:    Medical: Not on file    Non-medical: Not on  file  Tobacco Use  . Smoking status: Former Smoker    Packs/day: 1.00    Years: 15.00    Pack years: 15.00    Types: Cigarettes     Last attempt to quit: 09/28/1992    Years since quitting: 25.0  . Smokeless tobacco: Never Used  Substance and Sexual Activity  . Alcohol use: No  . Drug use: No  . Sexual activity: Not on file  Lifestyle  . Physical activity:    Days per week: Not on file    Minutes per session: Not on file  . Stress: Not on file  Relationships  . Social connections:    Talks on phone: Not on file    Gets together: Not on file    Attends religious service: Not on file    Active member of club or organization: Not on file    Attends meetings of clubs or organizations: Not on file    Relationship status: Not on file  . Intimate partner violence:    Fear of current or ex partner: Not on file    Emotionally abused: Not on file    Physically abused: Not on file    Forced sexual activity: Not on file  Other Topics Concern  . Not on file  Social History Narrative  . Not on file    FAMILY HISTORY Family History  Problem Relation Age of Onset  . Lymphoma Mother   . Diabetes Mother   . Lung cancer Sister   . Lung cancer Brother   . Lung cancer Brother     ALLERGIES:  has No Known Allergies.  MEDICATIONS:  Current Outpatient Medications  Medication Sig Dispense Refill  . acetaminophen (TYLENOL) 500 MG tablet Take 1,000 mg by mouth every 4 (four) hours as needed.     Marland Kitchen albuterol (PROVENTIL HFA;VENTOLIN HFA) 108 (90 Base) MCG/ACT inhaler Inhale 2 puffs into the lungs every 6 (six) hours as needed for wheezing or shortness of breath. 1 Inhaler 2  . benzonatate (TESSALON PERLES) 100 MG capsule Take 1 capsule (100 mg total) by mouth 3 (three) times daily as needed for cough. 30 capsule 0  . Cholecalciferol (VITAMIN D3) 2000 units capsule Take 1 capsule (2,000 Units total) by mouth daily.    . cyanocobalamin 500 MCG tablet Take 500 mcg by mouth daily.    . fluticasone (FLONASE) 50 MCG/ACT nasal spray Place 2 sprays into both nostrils daily. 16 g 6  . fluticasone (FLOVENT HFA) 44 MCG/ACT inhaler  Inhale 2 puffs into the lungs 2 (two) times daily. 1 Inhaler 12  . mupirocin ointment (BACTROBAN) 2 % Apply 1 application topically 2 (two) times daily as needed. 22 g 2  . omeprazole (PRILOSEC) 20 MG capsule Take 1 capsule (20 mg total) by mouth daily. 30 capsule 5  . terazosin (HYTRIN) 1 MG capsule Take 1 capsule (1 mg total) by mouth at bedtime.     No current facility-administered medications for this visit.    Facility-Administered Medications Ordered in Other Visits  Medication Dose Route Frequency Provider Last Rate Last Dose  . heparin lock flush 100 unit/mL  500 Units Intravenous Once Earlie Server, MD        PHYSICAL EXAMINATION:  ECOG PERFORMANCE STATUS: 1 - Symptomatic but completely ambulatory   Vitals:   10/20/17 0837  BP: 119/75  Pulse: 68  Resp: 18  Temp: (!) 96.4 F (35.8 C)  SpO2: 95%   Filed Weights   10/20/17 0837  Weight: 253  lb (114.8 kg)  Physical Exam  Constitutional: He is oriented to person, place, and time and well-developed, well-nourished, and in no distress. No distress.  HENT:  Head: Normocephalic and atraumatic.  Right Ear: External ear normal.  Left Ear: External ear normal.  Nose: Nose normal.  Mouth/Throat: Oropharynx is clear and moist. No oropharyngeal exudate.  Eyes: Pupils are equal, round, and reactive to light. Conjunctivae and EOM are normal. Right eye exhibits no discharge. Left eye exhibits no discharge. No scleral icterus.  Neck: Normal range of motion. Neck supple. No JVD present.  Cardiovascular: Normal rate, regular rhythm and normal heart sounds. Exam reveals no friction rub.  No murmur heard. Pulmonary/Chest: Effort normal. No respiratory distress. He has no wheezes. He has no rales. He exhibits no tenderness.    Decreased breath sounds bilaterally.  Abdominal: Soft. He exhibits no distension and no mass. There is no tenderness. There is no rebound and no guarding.  Musculoskeletal: Normal range of motion. He exhibits no edema,  tenderness or deformity.  Lymphadenopathy:    He has no cervical adenopathy.  Neurological: He is alert and oriented to person, place, and time. No cranial nerve deficit. He exhibits normal muscle tone. Coordination normal.  Skin: Skin is warm and dry. No rash noted. He is not diaphoretic. No erythema. No pallor.  Psychiatric: Mood, memory, affect and judgment normal.      LABORATORY DATA: I have personally reviewed the data as listed: CBC    Component Value Date/Time   WBC 4.8 10/06/2017 0816   RBC 4.49 10/06/2017 0816   HGB 13.0 10/06/2017 0816   HCT 39.5 (L) 10/06/2017 0816   PLT 166 10/06/2017 0816   MCV 88.0 10/06/2017 0816   MCH 29.0 10/06/2017 0816   MCHC 32.9 10/06/2017 0816   RDW 14.1 10/06/2017 0816   LYMPHSABS 0.7 (L) 10/06/2017 0816   MONOABS 0.4 10/06/2017 0816   EOSABS 0.8 (H) 10/06/2017 0816   BASOSABS 0.0 10/06/2017 0816    CMP Latest Ref Rng & Units 10/06/2017 09/22/2017 09/08/2017  Glucose 70 - 99 mg/dL 126(H) 112(H) 98  BUN 8 - 23 mg/dL '18 17 17  ' Creatinine 0.61 - 1.24 mg/dL 1.23 1.10 1.00  Sodium 135 - 145 mmol/L 141 139 138  Potassium 3.5 - 5.1 mmol/L 3.9 3.9 3.8  Chloride 98 - 111 mmol/L 110 108 108  CO2 22 - 32 mmol/L '24 22 22  ' Calcium 8.9 - 10.3 mg/dL 9.1 9.1 9.0  Total Protein 6.5 - 8.1 g/dL 7.4 7.3 7.2  Total Bilirubin 0.3 - 1.2 mg/dL 0.8 0.6 0.7  Alkaline Phos 38 - 126 U/L 67 69 70  AST 15 - 41 U/L '18 22 17  ' ALT 0 - 44 U/L '16 20 16   ' RADIOGRAPHIC STUDIES: I have personally reviewed the radiological images as listed and agreed with the findings in the report. 09/07/2017 CT chest abdomen pelvis w contrast Stable exam of the chest. No new lesion or progressive interval finding.  Post radiation changes. No metastatic disease.  Renal cysts, aortic atherosclerosis.   09/07/2017 Bone scan: no metastatic disease. Degenerative disease.   ASSESSMENT/PLAN Cancer Staging Squamous cell lung cancer, left [facility]) Staging form: Lung, AJCC 8th Edition -  Clinical stage from 09/27/2016: Stage IIIA (cT2a, cN2, cM0) - Signed by Earlie Server, MD on 09/29/2016  1. Squamous cell lung cancer, left ([place])   2. Encounter for antineoplastic immunotherapy   3. Cough   4. Port-A-Cath in place   5. Anemia, unspecified type  6. Lymphocytopenia   #Squamous cell lung cancer, stable disease. Labs reviewed and discussed with patient.  Counts acceptable to proceed with today's durvalumab treatment. He is approaching to the end of 1 year durvalumab treatment.  #Dry cough, history of radiation pneumonitis.  Check x-ray today further management pending x-ray results.  # Anemia.  Stable monitor.  # Chronic lymphocytopenia. Stable.  # Back pain: stable, at baseline. Intermittent. Not worse. Bone scan in July 2019 non remarkable.   Addendum: CXR was independantly reviewed by me. Stable exam, Similar radiation scarring.    Earlie Server, MD, PhD Hematology Oncology [facility] Pager- [phone] 10/20/2017

## 2017-11-03 ENCOUNTER — Encounter: Payer: Self-pay | Admitting: Oncology

## 2017-11-03 ENCOUNTER — Other Ambulatory Visit: Payer: Self-pay

## 2017-11-03 ENCOUNTER — Inpatient Hospital Stay: Payer: Medicare HMO

## 2017-11-03 ENCOUNTER — Inpatient Hospital Stay: Payer: Medicare HMO | Admitting: Oncology

## 2017-11-03 VITALS — BP 130/79 | HR 70 | Temp 97.1°F | Resp 18 | Wt 253.3 lb

## 2017-11-03 DIAGNOSIS — Z95828 Presence of other vascular implants and grafts: Secondary | ICD-10-CM

## 2017-11-03 DIAGNOSIS — R05 Cough: Secondary | ICD-10-CM | POA: Diagnosis not present

## 2017-11-03 DIAGNOSIS — C3492 Malignant neoplasm of unspecified part of left bronchus or lung: Secondary | ICD-10-CM

## 2017-11-03 DIAGNOSIS — Z87891 Personal history of nicotine dependence: Secondary | ICD-10-CM

## 2017-11-03 DIAGNOSIS — R530 Neoplastic (malignant) related fatigue: Secondary | ICD-10-CM

## 2017-11-03 DIAGNOSIS — Z5112 Encounter for antineoplastic immunotherapy: Secondary | ICD-10-CM | POA: Diagnosis not present

## 2017-11-03 DIAGNOSIS — C3412 Malignant neoplasm of upper lobe, left bronchus or lung: Secondary | ICD-10-CM | POA: Diagnosis not present

## 2017-11-03 DIAGNOSIS — D72810 Lymphocytopenia: Secondary | ICD-10-CM | POA: Diagnosis not present

## 2017-11-03 DIAGNOSIS — Z923 Personal history of irradiation: Secondary | ICD-10-CM

## 2017-11-03 DIAGNOSIS — D649 Anemia, unspecified: Secondary | ICD-10-CM

## 2017-11-03 DIAGNOSIS — R059 Cough, unspecified: Secondary | ICD-10-CM

## 2017-11-03 DIAGNOSIS — Z801 Family history of malignant neoplasm of trachea, bronchus and lung: Secondary | ICD-10-CM

## 2017-11-03 DIAGNOSIS — Z9221 Personal history of antineoplastic chemotherapy: Secondary | ICD-10-CM

## 2017-11-03 DIAGNOSIS — Z79899 Other long term (current) drug therapy: Secondary | ICD-10-CM

## 2017-11-03 LAB — CBC WITH DIFFERENTIAL/PLATELET
BASOS PCT: 1 %
Basophils Absolute: 0 10*3/uL (ref 0–0.1)
Eosinophils Absolute: 0.4 10*3/uL (ref 0–0.7)
Eosinophils Relative: 8 %
HCT: 39.6 % — ABNORMAL LOW (ref 40.0–52.0)
Hemoglobin: 13.3 g/dL (ref 13.0–18.0)
Lymphocytes Relative: 19 %
Lymphs Abs: 0.9 10*3/uL — ABNORMAL LOW (ref 1.0–3.6)
MCH: 29.4 pg (ref 26.0–34.0)
MCHC: 33.6 g/dL (ref 32.0–36.0)
MCV: 87.6 fL (ref 80.0–100.0)
MONO ABS: 0.4 10*3/uL (ref 0.2–1.0)
MONOS PCT: 8 %
Neutro Abs: 3.2 10*3/uL (ref 1.4–6.5)
Neutrophils Relative %: 64 %
Platelets: 158 10*3/uL (ref 150–440)
RBC: 4.52 MIL/uL (ref 4.40–5.90)
RDW: 14.4 % (ref 11.5–14.5)
WBC: 4.8 10*3/uL (ref 3.8–10.6)

## 2017-11-03 LAB — COMPREHENSIVE METABOLIC PANEL
ALK PHOS: 67 U/L (ref 38–126)
ALT: 24 U/L (ref 0–44)
AST: 23 U/L (ref 15–41)
Albumin: 3.9 g/dL (ref 3.5–5.0)
Anion gap: 7 (ref 5–15)
BUN: 16 mg/dL (ref 8–23)
CALCIUM: 9 mg/dL (ref 8.9–10.3)
CO2: 24 mmol/L (ref 22–32)
Chloride: 110 mmol/L (ref 98–111)
Creatinine, Ser: 1.13 mg/dL (ref 0.61–1.24)
GFR calc non Af Amer: >60 mL/min (ref >60)
GLUCOSE: 134 mg/dL — AB (ref 70–99)
Potassium: 3.9 mmol/L (ref 3.5–5.1)
SODIUM: 141 mmol/L (ref 135–145)
Total Bilirubin: 0.8 mg/dL (ref 0.3–1.2)
Total Protein: 7.3 g/dL (ref 6.5–8.1)

## 2017-11-03 MED ORDER — HEPARIN SOD (PORK) LOCK FLUSH 100 UNIT/ML IV SOLN
500.0000 [IU] | Freq: Once | INTRAVENOUS | Status: AC
  Administered 2017-11-03: 500 [IU] via INTRAVENOUS

## 2017-11-03 MED ORDER — SODIUM CHLORIDE 0.9% FLUSH
10.0000 mL | INTRAVENOUS | Status: DC | PRN
  Administered 2017-11-03: 10 mL via INTRAVENOUS
  Filled 2017-11-03: qty 10

## 2017-11-03 MED ORDER — SODIUM CHLORIDE 0.9 % IV SOLN
Freq: Once | INTRAVENOUS | Status: AC
  Administered 2017-11-03: 11:00:00 via INTRAVENOUS
  Filled 2017-11-03: qty 250

## 2017-11-03 MED ORDER — SODIUM CHLORIDE 0.9 % IV SOLN
10.0000 mg/kg | Freq: Once | INTRAVENOUS | Status: AC
  Administered 2017-11-03: 1120 mg via INTRAVENOUS
  Filled 2017-11-03: qty 2.4

## 2017-11-03 NOTE — Progress Notes (Addendum)
[facility] Follow Up Visit  Patient Care Team: Glean Hess, MD as PCP - General (Internal Medicine) Earlie Server, MD as Consulting Physician (Oncology)  REASON FOR VISIT:  Follow up for evaluation prior to maintenance immunotherapy  HISTORY OF PRESENT ILLNESS/ PERTINENT ONCOLOGY HISTORY #  Stage IIIA lung squamous cell cancer. S/p concurrent chemotherapy and Radiation.[ completed Oct 2018], started on Durvalumab in Nov 2019.  06/09/17 CT chest. scan was presented on tumor board on 06/22/2017, consensus is stable disease, with postradiation changes. Monitor # Pathology: TPS 50%. 09/22/2016 left upper lobe biopsy showed squamous cell carcinoma. Foundation one test: ordered prior to patient first visit. Showed NO EGFR, ROS1, ALK, BRAF, MET mutation.   Cancer Treatment: S/p concurrent carbo/taxol and Radiation.  Currently Durvalumab Q2 weeks. Treatment was interrupted after cycle 5 due to radiation pneumonitis. After steroid treatment, back on Durvalumab Q2weeks.   INTERVAL HISTORY Today [name] presented for follow up for assessment prior to immunotherapy maintenance for treatment of lung squamous cell cancer stage IIIA  # Immunotherapy, He has been on immunotherapy maintenance and tolerates well.  Denies any nausea vomiting, skin rash, diarrhea, # cough, During last visit he complained worsening of cough.  Chest x-ray was done which showed no acute process.  he takes cough syrup and reports that cough has improved, he coughs occasionally, back to his baseline.  Denies any shortness of breath more than usual.  #Fatigue is chronic, stable at baseline.  Patient continued to works as a Administrator. #Back pain resolved.   Review of Systems  Constitutional: Positive for fatigue. Negative for appetite change, chills, diaphoresis, fever and unexpected weight change.  HENT:   Negative for hearing loss, lump/mass, mouth sores, nosebleeds and sore throat.   Eyes:  Negative for eye problems and icterus.  Respiratory: Positive for cough. Negative for chest tightness, hemoptysis, shortness of breath and wheezing.   Cardiovascular: Negative for chest pain and leg swelling.  Gastrointestinal: Negative for abdominal distention, abdominal pain, blood in stool, diarrhea, nausea and rectal pain.  Endocrine: Negative for hot flashes.  Genitourinary: Negative for bladder incontinence, difficulty urinating, discharge, dysuria, frequency, hematuria, nocturia and pelvic pain.   Musculoskeletal: Negative for back pain, flank pain, gait problem, myalgias and neck pain.  Skin: Negative for itching, rash and wound.  Neurological: Negative for dizziness, gait problem, headaches, light-headedness, numbness and seizures.  Hematological: Negative for adenopathy. Does not bruise/bleed easily.  Psychiatric/Behavioral: Negative for confusion and decreased concentration. The patient is not nervous/anxious.       MEDICAL HISTORY: Past Medical History:  Diagnosis Date  . Enlarged prostate   . Squamous cell lung cancer, left ([place]) 08/2016    SURGICAL HISTORY: Past Surgical History:  Procedure Laterality Date  . NO PAST SURGERIES    . PORTACATH PLACEMENT Right 10/05/2016   Procedure: INSERTION PORT-A-CATH;  Surgeon: [name], MD;  Location: [facility] ORS;  Service: General;  Laterality: Right;    SOCIAL HISTORY: Social History   Socioeconomic History  . Marital status: Single    Spouse name: Not on file  . Number of children: Not on file  . Years of education: Not on file  . Highest education level: Not on file  Occupational History  . Not on file  Social Needs  . Financial resource strain: Not on file  . Food insecurity:    Worry: Not on file    Inability: Not on file  . Transportation needs:    Medical: Not on  file    Non-medical: Not on file  Tobacco Use  . Smoking status: Former Smoker    Packs/day: 1.00    Years: 15.00    Pack years: 15.00     Types: Cigarettes    Last attempt to quit: 09/28/1992    Years since quitting: 25.1  . Smokeless tobacco: Never Used  Substance and Sexual Activity  . Alcohol use: No  . Drug use: No  . Sexual activity: Not on file  Lifestyle  . Physical activity:    Days per week: Not on file    Minutes per session: Not on file  . Stress: Not on file  Relationships  . Social connections:    Talks on phone: Not on file    Gets together: Not on file    Attends religious service: Not on file    Active member of club or organization: Not on file    Attends meetings of clubs or organizations: Not on file    Relationship status: Not on file  . Intimate partner violence:    Fear of current or ex partner: Not on file    Emotionally abused: Not on file    Physically abused: Not on file    Forced sexual activity: Not on file  Other Topics Concern  . Not on file  Social History Narrative  . Not on file    FAMILY HISTORY Family History  Problem Relation Age of Onset  . Lymphoma Mother   . Diabetes Mother   . Lung cancer Sister   . Lung cancer Brother   . Lung cancer Brother     ALLERGIES:  has No Known Allergies.  MEDICATIONS:  Current Outpatient Medications  Medication Sig Dispense Refill  . acetaminophen (TYLENOL) 500 MG tablet Take 1,000 mg by mouth every 4 (four) hours as needed.     Marland Kitchen albuterol (PROVENTIL HFA;VENTOLIN HFA) 108 (90 Base) MCG/ACT inhaler Inhale 2 puffs into the lungs every 6 (six) hours as needed for wheezing or shortness of breath. 1 Inhaler 2  . benzonatate (TESSALON PERLES) 100 MG capsule Take 1 capsule (100 mg total) by mouth 3 (three) times daily as needed for cough. 30 capsule 0  . Cholecalciferol (VITAMIN D3) 2000 units capsule Take 1 capsule (2,000 Units total) by mouth daily.    . cyanocobalamin 500 MCG tablet Take 500 mcg by mouth daily.    Marland Kitchen Dextromethorphan-guaiFENesin 10-100 MG/5ML liquid Take 5 mLs by mouth every 12 (twelve) hours. 473 mL 1  . fluticasone  (FLONASE) 50 MCG/ACT nasal spray Place 2 sprays into both nostrils daily. (Patient taking differently: Place 2 sprays into both nostrils daily as needed. ) 16 g 6  . fluticasone (FLOVENT HFA) 44 MCG/ACT inhaler Inhale 2 puffs into the lungs 2 (two) times daily. (Patient taking differently: Inhale 2 puffs into the lungs 2 (two) times daily as needed. ) 1 Inhaler 12  . mupirocin ointment (BACTROBAN) 2 % Apply 1 application topically 2 (two) times daily as needed. 22 g 2  . omeprazole (PRILOSEC) 20 MG capsule Take 1 capsule (20 mg total) by mouth daily. 30 capsule 5  . terazosin (HYTRIN) 1 MG capsule Take 1 capsule (1 mg total) by mouth at bedtime.     No current facility-administered medications for this visit.    Facility-Administered Medications Ordered in Other Visits  Medication Dose Route Frequency Provider Last Rate Last Dose  . heparin lock flush 100 unit/mL  500 Units Intravenous Once Earlie Server, MD      .  sodium chloride flush (NS) 0.9 % injection 10 mL  10 mL Intravenous PRN Earlie Server, MD   10 mL at 11/03/17 0809    PHYSICAL EXAMINATION:  ECOG PERFORMANCE STATUS: 1 - Symptomatic but completely ambulatory   Vitals:   11/03/17 0835  BP: 130/79  Pulse: 70  Resp: 18  Temp: (!) 97.1 F (36.2 C)  SpO2: 97%   Filed Weights   11/03/17 0835  Weight: 253 lb 4.8 oz (114.9 kg)  Physical Exam  Constitutional: He is oriented to person, place, and time. No distress.  HENT:  Head: Normocephalic and atraumatic.  Right Ear: External ear normal.  Left Ear: External ear normal.  Nose: Nose normal.  Mouth/Throat: Oropharynx is clear and moist. No oropharyngeal exudate.  Eyes: Pupils are equal, round, and reactive to light. Conjunctivae and EOM are normal. Right eye exhibits no discharge. Left eye exhibits no discharge. No scleral icterus.  Neck: Normal range of motion. Neck supple. No JVD present.  Cardiovascular: Normal rate, regular rhythm and normal heart sounds. Exam reveals no friction  rub.  No murmur heard. Pulmonary/Chest: Effort normal. No respiratory distress. He has no wheezes. He has no rales.    Decreased breath sounds bilaterally.  Abdominal: Soft. He exhibits no distension and no mass. There is no tenderness. There is no rebound and no guarding.  Musculoskeletal: Normal range of motion. He exhibits no edema, tenderness or deformity.  Lymphadenopathy:    He has no cervical adenopathy.  Neurological: He is alert and oriented to person, place, and time. No cranial nerve deficit. He exhibits normal muscle tone. Coordination normal.  Skin: Skin is warm and dry. No rash noted. He is not diaphoretic. No erythema. No pallor.  Psychiatric: Mood, memory, affect and judgment normal.      LABORATORY DATA: I have personally reviewed the data as listed: CBC    Component Value Date/Time   WBC 4.8 11/03/2017 0815   RBC 4.52 11/03/2017 0815   HGB 13.3 11/03/2017 0815   HCT 39.6 (L) 11/03/2017 0815   PLT 158 11/03/2017 0815   MCV 87.6 11/03/2017 0815   MCH 29.4 11/03/2017 0815   MCHC 33.6 11/03/2017 0815   RDW 14.4 11/03/2017 0815   LYMPHSABS 0.9 (L) 11/03/2017 0815   MONOABS 0.4 11/03/2017 0815   EOSABS 0.4 11/03/2017 0815   BASOSABS 0.0 11/03/2017 0815    CMP Latest Ref Rng & Units 10/20/2017 10/06/2017 09/22/2017  Glucose 70 - 99 mg/dL 100(H) 126(H) 112(H)  BUN 8 - 23 mg/dL '17 18 17  ' Creatinine 0.61 - 1.24 mg/dL 0.98 1.23 1.10  Sodium 135 - 145 mmol/L 138 141 139  Potassium 3.5 - 5.1 mmol/L 4.0 3.9 3.9  Chloride 98 - 111 mmol/L 109 110 108  CO2 22 - 32 mmol/L '23 24 22  ' Calcium 8.9 - 10.3 mg/dL 8.7(L) 9.1 9.1  Total Protein 6.5 - 8.1 g/dL 7.1 7.4 7.3  Total Bilirubin 0.3 - 1.2 mg/dL 0.8 0.8 0.6  Alkaline Phos 38 - 126 U/L 67 67 69  AST 15 - 41 U/L '18 18 22  ' ALT 0 - 44 U/L '16 16 20   ' RADIOGRAPHIC STUDIES: I have personally reviewed the radiological images as listed and agreed with the findings in the report. 09/07/2017 CT chest abdomen pelvis w contrast Stable  exam of the chest. No new lesion or progressive interval finding.  Post radiation changes. No metastatic disease.  Renal cysts, aortic atherosclerosis.   09/07/2017 Bone scan: no metastatic disease. Degenerative disease.  RADIOGRAPHIC STUDIES: I have personally reviewed the radiological images as listed and agreed with the findings in the report. 10/20/2017 Chest CXR  PowerPort catheter noted with tip coiled in the superior vena cava, similar findings noted on prior exam.  2. Persistent left upper lung infiltrate consistent with radiation changes. Similar findings noted on prior exams.  ASSESSMENT/PLAN Cancer Staging Squamous cell lung cancer, left [facility]) Staging form: Lung, AJCC 8th Edition - Clinical stage from 09/27/2016: Stage IIIA (cT2a, cN2, cM0) - Signed by Earlie Server, MD on 09/29/2016  1. Squamous cell lung cancer, left ([place])   2. Encounter for antineoplastic immunotherapy   3. Cough   4. Port-A-Cath in place   5. Lymphocytopenia   #Squamous cell lung cancer, stable disease. Labs reviewed and discussed with patient.  Counts acceptable to proceed with today's durvalumab treatment.   Proceed with CT October surveillance. #Dry cough, history of radiation pneumonitis.  X-ray was independently reviewed by me and discussed with patient. Persistent left upper lung infiltrate consistent with radiation changes.  Similar findings noted on prior exams.  # Chronic lymphocytopenia.  Stable continue to monitor..  # Back pain: Resolved.  Bone scan in July 2019 non remarkable.  Follow-up in 2weeks prior to next cycle of therapy. Orders Placed This Encounter  Procedures  . CT Chest W Contrast    Standing Status:   Future    Standing Expiration Date:   11/03/2018    Order Specific Question:   If indicated for the ordered procedure, I authorize the administration of contrast media per Radiology protocol    Answer:   Yes    Order Specific Question:   Preferred imaging location?    Answer:    [facility]    Order Specific Question:   Radiology Contrast Protocol - do NOT remove file path    Answer:   [email or web address]     Earlie Server, MD, PhD Hematology Oncology [facility] at [facility] Pager- [phone] 11/03/2017

## 2017-11-03 NOTE — Progress Notes (Signed)
Dr. Tasia Catchings stated verbal metabolic panel results are within limits to proceed with alimta/keytruda today.

## 2017-11-03 NOTE — Progress Notes (Signed)
Patient here for follow up. Pt states dry cough is getting better.

## 2017-11-17 ENCOUNTER — Inpatient Hospital Stay: Payer: Medicare HMO

## 2017-11-17 ENCOUNTER — Inpatient Hospital Stay: Payer: Medicare HMO | Admitting: Oncology

## 2017-11-17 ENCOUNTER — Other Ambulatory Visit: Payer: Self-pay

## 2017-11-17 ENCOUNTER — Encounter: Payer: Self-pay | Admitting: Oncology

## 2017-11-17 ENCOUNTER — Inpatient Hospital Stay: Payer: Medicare HMO | Attending: Oncology

## 2017-11-17 VITALS — BP 136/79 | HR 59 | Temp 97.5°F | Resp 18 | Wt 251.3 lb

## 2017-11-17 DIAGNOSIS — R059 Cough, unspecified: Secondary | ICD-10-CM

## 2017-11-17 DIAGNOSIS — D72810 Lymphocytopenia: Secondary | ICD-10-CM

## 2017-11-17 DIAGNOSIS — Z5112 Encounter for antineoplastic immunotherapy: Secondary | ICD-10-CM | POA: Insufficient documentation

## 2017-11-17 DIAGNOSIS — D649 Anemia, unspecified: Secondary | ICD-10-CM | POA: Diagnosis not present

## 2017-11-17 DIAGNOSIS — Z79899 Other long term (current) drug therapy: Secondary | ICD-10-CM | POA: Insufficient documentation

## 2017-11-17 DIAGNOSIS — R05 Cough: Secondary | ICD-10-CM | POA: Insufficient documentation

## 2017-11-17 DIAGNOSIS — R5383 Other fatigue: Secondary | ICD-10-CM | POA: Diagnosis not present

## 2017-11-17 DIAGNOSIS — Z87891 Personal history of nicotine dependence: Secondary | ICD-10-CM | POA: Diagnosis not present

## 2017-11-17 DIAGNOSIS — C3412 Malignant neoplasm of upper lobe, left bronchus or lung: Secondary | ICD-10-CM | POA: Insufficient documentation

## 2017-11-17 DIAGNOSIS — Z801 Family history of malignant neoplasm of trachea, bronchus and lung: Secondary | ICD-10-CM | POA: Diagnosis not present

## 2017-11-17 DIAGNOSIS — Z923 Personal history of irradiation: Secondary | ICD-10-CM | POA: Diagnosis not present

## 2017-11-17 DIAGNOSIS — Z9221 Personal history of antineoplastic chemotherapy: Secondary | ICD-10-CM | POA: Insufficient documentation

## 2017-11-17 DIAGNOSIS — C3492 Malignant neoplasm of unspecified part of left bronchus or lung: Secondary | ICD-10-CM

## 2017-11-17 LAB — CBC WITH DIFFERENTIAL/PLATELET
BASOS ABS: 0 10*3/uL (ref 0–0.1)
Basophils Relative: 1 %
Eosinophils Absolute: 0.4 10*3/uL (ref 0–0.7)
Eosinophils Relative: 7 %
HCT: 39.2 % — ABNORMAL LOW (ref 40.0–52.0)
Hemoglobin: 13 g/dL (ref 13.0–18.0)
LYMPHS ABS: 1 10*3/uL (ref 1.0–3.6)
Lymphocytes Relative: 16 %
MCH: 29.2 pg (ref 26.0–34.0)
MCHC: 33.3 g/dL (ref 32.0–36.0)
MCV: 87.9 fL (ref 80.0–100.0)
MONO ABS: 0.5 10*3/uL (ref 0.2–1.0)
MONOS PCT: 9 %
Neutro Abs: 4 10*3/uL (ref 1.4–6.5)
Neutrophils Relative %: 67 %
PLATELETS: 169 10*3/uL (ref 150–440)
RBC: 4.45 MIL/uL (ref 4.40–5.90)
RDW: 13.9 % (ref 11.5–14.5)
WBC: 5.9 10*3/uL (ref 3.8–10.6)

## 2017-11-17 LAB — COMPREHENSIVE METABOLIC PANEL
ALBUMIN: 3.9 g/dL (ref 3.5–5.0)
ALT: 13 U/L (ref 0–44)
AST: 15 U/L (ref 15–41)
Alkaline Phosphatase: 58 U/L (ref 38–126)
Anion gap: 7 (ref 5–15)
BUN: 18 mg/dL (ref 8–23)
CO2: 25 mmol/L (ref 22–32)
CREATININE: 1.06 mg/dL (ref 0.61–1.24)
Calcium: 9.1 mg/dL (ref 8.9–10.3)
Chloride: 109 mmol/L (ref 98–111)
GFR calc non Af Amer: >60 mL/min (ref >60)
GLUCOSE: 98 mg/dL (ref 70–99)
Potassium: 4.3 mmol/L (ref 3.5–5.1)
SODIUM: 141 mmol/L (ref 135–145)
Total Bilirubin: 0.8 mg/dL (ref 0.3–1.2)
Total Protein: 7.4 g/dL (ref 6.5–8.1)

## 2017-11-17 LAB — TSH: TSH: 2.018 u[IU]/mL (ref 0.350–4.500)

## 2017-11-17 MED ORDER — SODIUM CHLORIDE 0.9% FLUSH
10.0000 mL | Freq: Once | INTRAVENOUS | Status: AC
  Administered 2017-11-17: 10 mL via INTRAVENOUS
  Filled 2017-11-17: qty 10

## 2017-11-17 MED ORDER — SODIUM CHLORIDE 0.9 % IV SOLN
10.0000 mg/kg | Freq: Once | INTRAVENOUS | Status: AC
  Administered 2017-11-17: 1120 mg via INTRAVENOUS
  Filled 2017-11-17: qty 20

## 2017-11-17 MED ORDER — SODIUM CHLORIDE 0.9 % IV SOLN
Freq: Once | INTRAVENOUS | Status: AC
  Administered 2017-11-17: 09:00:00 via INTRAVENOUS
  Filled 2017-11-17: qty 250

## 2017-11-17 MED ORDER — HEPARIN SOD (PORK) LOCK FLUSH 100 UNIT/ML IV SOLN
500.0000 [IU] | Freq: Once | INTRAVENOUS | Status: AC
  Administered 2017-11-17: 500 [IU] via INTRAVENOUS
  Filled 2017-11-17: qty 5

## 2017-11-17 NOTE — Progress Notes (Signed)
Patient here for follow up. Pt  States that he has a tooth that needs to be pulled and he wants to know if it would be ok to do it since he is on treatment.

## 2017-11-17 NOTE — Progress Notes (Signed)
[place] [facility] Follow Up Visit  Patient Care Team: Glean Hess, MD as PCP - General (Internal Medicine) Earlie Server, MD as Consulting Physician (Oncology)  REASON FOR VISIT:  Follow up for evaluation prior to maintenance immunotherapy  HISTORY OF PRESENT ILLNESS/ PERTINENT ONCOLOGY HISTORY #  Stage IIIA lung squamous cell cancer. S/p concurrent chemotherapy and Radiation.[ completed Oct 2018], started on Durvalumab in Nov 2019.  06/09/17 CT chest. scan was presented on tumor board on 06/22/2017, consensus is stable disease, with postradiation changes. Monitor # Pathology: TPS 50%. 09/22/2016 left upper lobe biopsy showed squamous cell carcinoma. Foundation one test: ordered prior to patient first visit. Showed NO EGFR, ROS1, ALK, BRAF, MET mutation.   Cancer Treatment: S/p concurrent carbo/taxol and Radiation.  Currently Durvalumab Q2 weeks. Treatment was interrupted after cycle 5 due to radiation pneumonitis. After steroid treatment, back on Durvalumab Q2weeks.   INTERVAL HISTORY Today [name] presented for follow up for assessment prior to immunotherapy maintenance for treatment of lung squamous cell carcinoma stage IIIA  Patient has been doing well on immunotherapy.  Tolerates well.  Denies any nausea vomiting, skin rash, diarrhea. Cough is chronic.  No worsening symptoms.  Chest x-ray recently was done which showed no acute process.  Fatigue is chronic.  Stable at baseline.  Continue to work as a Administrator.    Review of Systems  Constitutional: Positive for fatigue. Negative for appetite change, chills, diaphoresis, fever and unexpected weight change.  HENT:   Negative for hearing loss, lump/mass, mouth sores, nosebleeds and sore throat.   Eyes: Negative for eye problems and icterus.  Respiratory: Positive for cough. Negative for chest tightness, hemoptysis, shortness of breath and wheezing.   Cardiovascular: Negative for chest pain and leg swelling.   Gastrointestinal: Negative for abdominal distention, abdominal pain, blood in stool, diarrhea, nausea and rectal pain.  Endocrine: Negative for hot flashes.  Genitourinary: Negative for bladder incontinence, difficulty urinating, discharge, dysuria, frequency, hematuria, nocturia and pelvic pain.   Musculoskeletal: Negative for back pain, flank pain, gait problem, myalgias and neck pain.  Skin: Negative for itching, rash and wound.  Neurological: Negative for dizziness, gait problem, headaches, light-headedness, numbness and seizures.  Hematological: Negative for adenopathy. Does not bruise/bleed easily.  Psychiatric/Behavioral: Negative for confusion and decreased concentration. The patient is not nervous/anxious.       MEDICAL HISTORY: Past Medical History:  Diagnosis Date  . Enlarged prostate   . Squamous cell lung cancer, left ([place]) 08/2016    SURGICAL HISTORY: Past Surgical History:  Procedure Laterality Date  . NO PAST SURGERIES    . PORTACATH PLACEMENT Right 10/05/2016   Procedure: INSERTION PORT-A-CATH;  Surgeon: [name], MD;  Location: [facility] ORS;  Service: General;  Laterality: Right;    SOCIAL HISTORY: Social History   Socioeconomic History  . Marital status: Single    Spouse name: Not on file  . Number of children: Not on file  . Years of education: Not on file  . Highest education level: Not on file  Occupational History  . Not on file  Social Needs  . Financial resource strain: Not on file  . Food insecurity:    Worry: Not on file    Inability: Not on file  . Transportation needs:    Medical: Not on file    Non-medical: Not on file  Tobacco Use  . Smoking status: Former Smoker    Packs/day: 1.00    Years: 15.00    Pack years:  15.00    Types: Cigarettes    Last attempt to quit: 09/28/1992    Years since quitting: 25.1  . Smokeless tobacco: Never Used  Substance and Sexual Activity  . Alcohol use: No  . Drug use: No  . Sexual activity: Not  on file  Lifestyle  . Physical activity:    Days per week: Not on file    Minutes per session: Not on file  . Stress: Not on file  Relationships  . Social connections:    Talks on phone: Not on file    Gets together: Not on file    Attends religious service: Not on file    Active member of club or organization: Not on file    Attends meetings of clubs or organizations: Not on file    Relationship status: Not on file  . Intimate partner violence:    Fear of current or ex partner: Not on file    Emotionally abused: Not on file    Physically abused: Not on file    Forced sexual activity: Not on file  Other Topics Concern  . Not on file  Social History Narrative  . Not on file    FAMILY HISTORY Family History  Problem Relation Age of Onset  . Lymphoma Mother   . Diabetes Mother   . Lung cancer Sister   . Lung cancer Brother   . Lung cancer Brother     ALLERGIES:  has No Known Allergies.  MEDICATIONS:  Current Outpatient Medications  Medication Sig Dispense Refill  . acetaminophen (TYLENOL) 500 MG tablet Take 1,000 mg by mouth every 4 (four) hours as needed.     Marland Kitchen albuterol (PROVENTIL HFA;VENTOLIN HFA) 108 (90 Base) MCG/ACT inhaler Inhale 2 puffs into the lungs every 6 (six) hours as needed for wheezing or shortness of breath. 1 Inhaler 2  . benzonatate (TESSALON PERLES) 100 MG capsule Take 1 capsule (100 mg total) by mouth 3 (three) times daily as needed for cough. 30 capsule 0  . Cholecalciferol (VITAMIN D3) 2000 units capsule Take 1 capsule (2,000 Units total) by mouth daily.    . cyanocobalamin 500 MCG tablet Take 500 mcg by mouth daily.    Marland Kitchen Dextromethorphan-guaiFENesin 10-100 MG/5ML liquid Take 5 mLs by mouth every 12 (twelve) hours. 473 mL 1  . fluticasone (FLONASE) 50 MCG/ACT nasal spray Place 2 sprays into both nostrils daily. (Patient taking differently: Place 2 sprays into both nostrils daily as needed. ) 16 g 6  . fluticasone (FLOVENT HFA) 44 MCG/ACT inhaler  Inhale 2 puffs into the lungs 2 (two) times daily. (Patient taking differently: Inhale 2 puffs into the lungs 2 (two) times daily as needed. ) 1 Inhaler 12  . mupirocin ointment (BACTROBAN) 2 % Apply 1 application topically 2 (two) times daily as needed. 22 g 2  . omeprazole (PRILOSEC) 20 MG capsule Take 1 capsule (20 mg total) by mouth daily. 30 capsule 5  . terazosin (HYTRIN) 1 MG capsule Take 1 capsule (1 mg total) by mouth at bedtime.     No current facility-administered medications for this visit.     PHYSICAL EXAMINATION:  ECOG PERFORMANCE STATUS: 1 - Symptomatic but completely ambulatory   Vitals:   11/17/17 0830  BP: 136/79  Pulse: (!) 59  Resp: 18  Temp: (!) 97.5 F (36.4 C)  SpO2: 97%   Filed Weights   11/17/17 0830  Weight: 251 lb 4.8 oz (114 kg)  Physical Exam  Constitutional: He is oriented to  person, place, and time. No distress.  HENT:  Head: Normocephalic and atraumatic.  Right Ear: External ear normal.  Left Ear: External ear normal.  Nose: Nose normal.  Mouth/Throat: Oropharynx is clear and moist. No oropharyngeal exudate.  Eyes: Pupils are equal, round, and reactive to light. Conjunctivae and EOM are normal. Right eye exhibits no discharge. Left eye exhibits no discharge. No scleral icterus.  Neck: Normal range of motion. Neck supple. No JVD present.  Cardiovascular: Normal rate, regular rhythm and normal heart sounds. Exam reveals no friction rub.  No murmur heard. Pulmonary/Chest: Effort normal. No respiratory distress. He has no wheezes. He has no rales. He exhibits no tenderness.    Decreased breath sounds bilaterally.  Abdominal: Soft. He exhibits no distension and no mass. There is no tenderness. There is no rebound and no guarding.  Musculoskeletal: Normal range of motion. He exhibits no edema, tenderness or deformity.  Lymphadenopathy:    He has no cervical adenopathy.  Neurological: He is alert and oriented to person, place, and time. No cranial  nerve deficit. He exhibits normal muscle tone. Coordination normal.  Skin: Skin is warm and dry. No rash noted. He is not diaphoretic. No erythema. No pallor.  Psychiatric: Mood, memory, affect and judgment normal.      LABORATORY DATA: I have personally reviewed the data as listed: CBC    Component Value Date/Time   WBC 5.9 11/17/2017 0812   RBC 4.45 11/17/2017 0812   HGB 13.0 11/17/2017 0812   HCT 39.2 (L) 11/17/2017 0812   PLT 169 11/17/2017 0812   MCV 87.9 11/17/2017 0812   MCH 29.2 11/17/2017 0812   MCHC 33.3 11/17/2017 0812   RDW 13.9 11/17/2017 0812   LYMPHSABS 1.0 11/17/2017 0812   MONOABS 0.5 11/17/2017 0812   EOSABS 0.4 11/17/2017 0812   BASOSABS 0.0 11/17/2017 0812    CMP Latest Ref Rng & Units 11/17/2017 11/03/2017 10/20/2017  Glucose 70 - 99 mg/dL 98 134(H) 100(H)  BUN 8 - 23 mg/dL '18 16 17  ' Creatinine 0.61 - 1.24 mg/dL 1.06 1.13 0.98  Sodium 135 - 145 mmol/L 141 141 138  Potassium 3.5 - 5.1 mmol/L 4.3 3.9 4.0  Chloride 98 - 111 mmol/L 109 110 109  CO2 22 - 32 mmol/L '25 24 23  ' Calcium 8.9 - 10.3 mg/dL 9.1 9.0 8.7(L)  Total Protein 6.5 - 8.1 g/dL 7.4 7.3 7.1  Total Bilirubin 0.3 - 1.2 mg/dL 0.8 0.8 0.8  Alkaline Phos 38 - 126 U/L 58 67 67  AST 15 - 41 U/L '15 23 18  ' ALT 0 - 44 U/L '13 24 16   ' RADIOGRAPHIC STUDIES: I have personally reviewed the radiological images as listed and agreed with the findings in the report. 09/07/2017 CT chest abdomen pelvis w contrast Stable exam of the chest. No new lesion or progressive interval finding.  Post radiation changes. No metastatic disease.  Renal cysts, aortic atherosclerosis.   09/07/2017 Bone scan: no metastatic disease. Degenerative disease.   RADIOGRAPHIC STUDIES: I have personally reviewed the radiological images as listed and agreed with the findings in the report. 10/20/2017 Chest CXR  PowerPort catheter noted with tip coiled in the superior vena cava, similar findings noted on prior exam.  2. Persistent left  upper lung infiltrate consistent with radiation changes. Similar findings noted on prior exams.  ASSESSMENT/PLAN Cancer Staging Squamous cell lung cancer, left ([facility]) Staging form: Lung, AJCC 8th Edition - Clinical stage from 09/27/2016: Stage IIIA (cT2a, cN2, cM0) - Signed by  Earlie Server, MD on 09/29/2016  1. Squamous cell lung cancer, left ([place])   2. Encounter for antineoplastic immunotherapy   3. Cough   4. Lymphocytopenia   5. Other fatigue   #Squamous cell lung cancer, stable disease. Labs reviewed and discussed with patient.  Counts acceptable to proceed with today's durvalumab treatment. Proceed with CT surveillance.  Scheduled.  TSH pending.  #Dry cough, history of radiation pneumonitis.  X-ray was independently reviewed which showed persistent left upper lobe infiltrate consistent with radiation changes.  Similar findings.  He is going to have a CT done next week.  # Chronic lymphocytopenia.  Stable.  Continue to monitor. Patient asked if he can have dental procedure for tooth extraction.  I told patient that it is okay to do tooth extraction. Encourage patient to get influenza vaccination.  Follow-up in 2 weeks for the next cycle of durvalumab.. No orders of the defined types were placed in this encounter.  Total face to face encounter time for this patient visit was 25 min. >50% of the time was  spent in counseling and coordination of care.   Earlie Server, MD, PhD Hematology Oncology [facility] at [facility] Pager- [phone] 11/17/2017

## 2017-11-22 ENCOUNTER — Encounter: Payer: Self-pay | Admitting: *Deleted

## 2017-11-24 ENCOUNTER — Ambulatory Visit
Admission: RE | Admit: 2017-11-24 | Discharge: 2017-11-24 | Disposition: A | Discharge status: Home / Self Care | Payer: Medicare HMO | Source: Ambulatory Visit | Attending: Oncology | Admitting: Oncology

## 2017-11-24 DIAGNOSIS — I700 Atherosclerosis of aorta: Secondary | ICD-10-CM | POA: Diagnosis not present

## 2017-11-24 DIAGNOSIS — R918 Other nonspecific abnormal finding of lung field: Secondary | ICD-10-CM | POA: Diagnosis not present

## 2017-11-24 DIAGNOSIS — J432 Centrilobular emphysema: Secondary | ICD-10-CM | POA: Insufficient documentation

## 2017-11-24 DIAGNOSIS — J438 Other emphysema: Secondary | ICD-10-CM | POA: Insufficient documentation

## 2017-11-24 DIAGNOSIS — C3492 Malignant neoplasm of unspecified part of left bronchus or lung: Secondary | ICD-10-CM

## 2017-11-24 DIAGNOSIS — Z5112 Encounter for antineoplastic immunotherapy: Secondary | ICD-10-CM | POA: Diagnosis not present

## 2017-11-24 DIAGNOSIS — I2510 Atherosclerotic heart disease of native coronary artery without angina pectoris: Secondary | ICD-10-CM | POA: Diagnosis not present

## 2017-11-24 MED ORDER — IOPAMIDOL (ISOVUE-300) INJECTION 61%
75.0000 mL | Freq: Once | INTRAVENOUS | Status: AC | PRN
  Administered 2017-11-24: 75 mL via INTRAVENOUS

## 2017-11-29 ENCOUNTER — Other Ambulatory Visit: Payer: Self-pay | Admitting: Oncology

## 2017-12-01 ENCOUNTER — Inpatient Hospital Stay: Payer: Medicare HMO

## 2017-12-01 ENCOUNTER — Other Ambulatory Visit: Payer: Self-pay

## 2017-12-01 ENCOUNTER — Encounter: Payer: Self-pay | Admitting: Radiation Oncology

## 2017-12-01 ENCOUNTER — Ambulatory Visit
Admission: RE | Admit: 2017-12-01 | Discharge: 2017-12-01 | Disposition: A | Discharge status: Home / Self Care | Payer: Medicare HMO | Source: Ambulatory Visit | Attending: Radiation Oncology | Admitting: Radiation Oncology

## 2017-12-01 ENCOUNTER — Encounter: Payer: Self-pay | Admitting: Oncology

## 2017-12-01 ENCOUNTER — Inpatient Hospital Stay (HOSPITAL_BASED_OUTPATIENT_CLINIC_OR_DEPARTMENT_OTHER): Payer: Medicare HMO | Admitting: Oncology

## 2017-12-01 VITALS — BP 141/71 | HR 64 | Temp 95.8°F | Resp 18 | Wt 251.9 lb

## 2017-12-01 VITALS — BP 126/74 | HR 67 | Temp 96.6°F | Resp 18 | Wt 251.9 lb

## 2017-12-01 DIAGNOSIS — Z5112 Encounter for antineoplastic immunotherapy: Secondary | ICD-10-CM | POA: Diagnosis not present

## 2017-12-01 DIAGNOSIS — D649 Anemia, unspecified: Secondary | ICD-10-CM | POA: Diagnosis not present

## 2017-12-01 DIAGNOSIS — Z87891 Personal history of nicotine dependence: Secondary | ICD-10-CM

## 2017-12-01 DIAGNOSIS — C3412 Malignant neoplasm of upper lobe, left bronchus or lung: Secondary | ICD-10-CM | POA: Insufficient documentation

## 2017-12-01 DIAGNOSIS — D72810 Lymphocytopenia: Secondary | ICD-10-CM

## 2017-12-01 DIAGNOSIS — Z9221 Personal history of antineoplastic chemotherapy: Secondary | ICD-10-CM

## 2017-12-01 DIAGNOSIS — R05 Cough: Secondary | ICD-10-CM | POA: Diagnosis not present

## 2017-12-01 DIAGNOSIS — Z923 Personal history of irradiation: Secondary | ICD-10-CM | POA: Diagnosis not present

## 2017-12-01 DIAGNOSIS — Z79899 Other long term (current) drug therapy: Secondary | ICD-10-CM

## 2017-12-01 DIAGNOSIS — R5383 Other fatigue: Secondary | ICD-10-CM

## 2017-12-01 DIAGNOSIS — C3492 Malignant neoplasm of unspecified part of left bronchus or lung: Secondary | ICD-10-CM

## 2017-12-01 DIAGNOSIS — Z801 Family history of malignant neoplasm of trachea, bronchus and lung: Secondary | ICD-10-CM

## 2017-12-01 LAB — CBC WITH DIFFERENTIAL/PLATELET
ABS IMMATURE GRANULOCYTES: 0.01 10*3/uL (ref 0.00–0.07)
Basophils Absolute: 0 10*3/uL (ref 0.0–0.1)
Basophils Relative: 1 %
Eosinophils Absolute: 0.3 10*3/uL (ref 0.0–0.5)
Eosinophils Relative: 6 %
HCT: 39.3 % (ref 39.0–52.0)
HEMOGLOBIN: 12.6 g/dL — AB (ref 13.0–17.0)
IMMATURE GRANULOCYTES: 0 %
Lymphocytes Relative: 21 %
Lymphs Abs: 1 10*3/uL (ref 0.7–4.0)
MCH: 27.9 pg (ref 26.0–34.0)
MCHC: 32.1 g/dL (ref 30.0–36.0)
MCV: 87.1 fL (ref 80.0–100.0)
MONOS PCT: 7 %
Monocytes Absolute: 0.3 10*3/uL (ref 0.1–1.0)
NEUTROS PCT: 65 %
Neutro Abs: 3 10*3/uL (ref 1.7–7.7)
Platelets: 167 10*3/uL (ref 150–400)
RBC: 4.51 MIL/uL (ref 4.22–5.81)
RDW: 13.2 % (ref 11.5–15.5)
WBC: 4.7 10*3/uL (ref 4.0–10.5)
nRBC: 0 % (ref 0.0–0.2)

## 2017-12-01 LAB — COMPREHENSIVE METABOLIC PANEL
ALBUMIN: 3.8 g/dL (ref 3.5–5.0)
ALK PHOS: 61 U/L (ref 38–126)
ALT: 17 U/L (ref 0–44)
ANION GAP: 6 (ref 5–15)
AST: 22 U/L (ref 15–41)
BUN: 17 mg/dL (ref 8–23)
CO2: 25 mmol/L (ref 22–32)
Calcium: 9 mg/dL (ref 8.9–10.3)
Chloride: 110 mmol/L (ref 98–111)
Creatinine, Ser: 1.15 mg/dL (ref 0.61–1.24)
GFR calc Af Amer: >60 mL/min (ref >60)
GFR calc non Af Amer: >60 mL/min (ref >60)
GLUCOSE: 130 mg/dL — AB (ref 70–99)
Potassium: 3.8 mmol/L (ref 3.5–5.1)
SODIUM: 141 mmol/L (ref 135–145)
Total Bilirubin: 0.7 mg/dL (ref 0.3–1.2)
Total Protein: 7.1 g/dL (ref 6.5–8.1)

## 2017-12-01 MED ORDER — SODIUM CHLORIDE 0.9 % IV SOLN
Freq: Once | INTRAVENOUS | Status: AC
  Administered 2017-12-01: 10:00:00 via INTRAVENOUS
  Filled 2017-12-01: qty 250

## 2017-12-01 MED ORDER — SODIUM CHLORIDE 0.9 % IV SOLN
10.0000 mg/kg | Freq: Once | INTRAVENOUS | Status: AC
  Administered 2017-12-01: 1120 mg via INTRAVENOUS
  Filled 2017-12-01: qty 20

## 2017-12-01 MED ORDER — HEPARIN SOD (PORK) LOCK FLUSH 100 UNIT/ML IV SOLN
500.0000 [IU] | Freq: Once | INTRAVENOUS | Status: AC | PRN
  Administered 2017-12-01: 500 [IU]
  Filled 2017-12-01: qty 5

## 2017-12-01 NOTE — Progress Notes (Signed)
Patient here for follow up. No concerns voiced.  °

## 2017-12-01 NOTE — Progress Notes (Signed)
[facility] Follow Up Visit  Patient Care Team: Glean Hess, MD as PCP - General (Internal Medicine) Earlie Server, MD as Consulting Physician (Oncology)  REASON FOR VISIT:  Follow up for evaluation prior to maintenance immunotherapy  HISTORY OF PRESENT ILLNESS/ PERTINENT ONCOLOGY HISTORY #  Stage IIIA lung squamous cell cancer. S/p concurrent chemotherapy and Radiation.[ completed Oct 2018], started on Durvalumab in Nov 2019.  06/09/17 CT chest. scan was presented on tumor board on 06/22/2017, consensus is stable disease, with postradiation changes. Monitor # Pathology: TPS 50%. 09/22/2016 left upper lobe biopsy showed squamous cell carcinoma. Foundation one test: ordered prior to patient first visit. Showed NO EGFR, ROS1, ALK, BRAF, MET mutation.   Cancer Treatment: S/p concurrent carbo/taxol and Radiation.  Currently Durvalumab Q2 weeks. Treatment was interrupted after cycle 5 due to radiation pneumonitis. After steroid treatment, back on Durvalumab Q2weeks.   INTERVAL HISTORY Today [name] presented for follow up for assessment prior to immunotherapy maintenance for treatment of lung squamous cell carcinoma stage IIIA  #Patient has been doing well on immunotherapy.  Tolerates well.  Denies any nausea vomiting, skin rash or diarrhea.  Cough is chronic.  No worsening symptoms.  During interval which he has had a CT chest without contrast done. Fatigue is chronic.  Stable at baseline.  Continue to work as a Administrator.     Review of Systems  Constitutional: Positive for fatigue. Negative for appetite change, chills, diaphoresis, fever and unexpected weight change.  HENT:   Negative for hearing loss, lump/mass, mouth sores, nosebleeds and sore throat.   Eyes: Negative for eye problems and icterus.  Respiratory: Positive for cough. Negative for chest tightness, hemoptysis, shortness of breath and wheezing.   Cardiovascular: Negative for chest pain and leg  swelling.  Gastrointestinal: Negative for abdominal distention, abdominal pain, blood in stool, diarrhea, nausea and rectal pain.  Endocrine: Negative for hot flashes.  Genitourinary: Negative for bladder incontinence, difficulty urinating, discharge, dysuria, frequency, hematuria, nocturia and pelvic pain.   Musculoskeletal: Negative for back pain, flank pain, gait problem, myalgias and neck pain.  Skin: Negative for itching, rash and wound.  Neurological: Negative for dizziness, gait problem, headaches, light-headedness, numbness and seizures.  Hematological: Negative for adenopathy. Does not bruise/bleed easily.  Psychiatric/Behavioral: Negative for confusion and decreased concentration. The patient is not nervous/anxious.       MEDICAL HISTORY: Past Medical History:  Diagnosis Date  . Enlarged prostate   . Squamous cell lung cancer, left ([place]) 08/2016    SURGICAL HISTORY: Past Surgical History:  Procedure Laterality Date  . NO PAST SURGERIES    . PORTACATH PLACEMENT Right 10/05/2016   Procedure: INSERTION PORT-A-CATH;  Surgeon: [name], MD;  Location: [facility] ORS;  Service: General;  Laterality: Right;    SOCIAL HISTORY: Social History   Socioeconomic History  . Marital status: Single    Spouse name: Not on file  . Number of children: Not on file  . Years of education: Not on file  . Highest education level: Not on file  Occupational History  . Not on file  Social Needs  . Financial resource strain: Not on file  . Food insecurity:    Worry: Not on file    Inability: Not on file  . Transportation needs:    Medical: Not on file    Non-medical: Not on file  Tobacco Use  . Smoking status: Former Smoker    Packs/day: 1.00    Years: 15.00  Pack years: 15.00    Types: Cigarettes    Last attempt to quit: 09/28/1992    Years since quitting: 25.1  . Smokeless tobacco: Never Used  Substance and Sexual Activity  . Alcohol use: No  . Drug use: No  . Sexual  activity: Not on file  Lifestyle  . Physical activity:    Days per week: Not on file    Minutes per session: Not on file  . Stress: Not on file  Relationships  . Social connections:    Talks on phone: Not on file    Gets together: Not on file    Attends religious service: Not on file    Active member of club or organization: Not on file    Attends meetings of clubs or organizations: Not on file    Relationship status: Not on file  . Intimate partner violence:    Fear of current or ex partner: Not on file    Emotionally abused: Not on file    Physically abused: Not on file    Forced sexual activity: Not on file  Other Topics Concern  . Not on file  Social History Narrative  . Not on file    FAMILY HISTORY Family History  Problem Relation Age of Onset  . Lymphoma Mother   . Diabetes Mother   . Lung cancer Sister   . Lung cancer Brother   . Lung cancer Brother     ALLERGIES:  has No Known Allergies.  MEDICATIONS:  Current Outpatient Medications  Medication Sig Dispense Refill  . acetaminophen (TYLENOL) 500 MG tablet Take 1,000 mg by mouth every 4 (four) hours as needed.     Marland Kitchen albuterol (PROVENTIL HFA;VENTOLIN HFA) 108 (90 Base) MCG/ACT inhaler Inhale 2 puffs into the lungs every 6 (six) hours as needed for wheezing or shortness of breath. 1 Inhaler 2  . benzonatate (TESSALON PERLES) 100 MG capsule Take 1 capsule (100 mg total) by mouth 3 (three) times daily as needed for cough. 30 capsule 0  . Cholecalciferol (VITAMIN D3) 2000 units capsule Take 1 capsule (2,000 Units total) by mouth daily.    . cyanocobalamin 500 MCG tablet Take 500 mcg by mouth daily.    Marland Kitchen Dextromethorphan-guaiFENesin 10-100 MG/5ML liquid Take 5 mLs by mouth every 12 (twelve) hours. 473 mL 1  . fluticasone (FLONASE) 50 MCG/ACT nasal spray Place 2 sprays into both nostrils daily. (Patient taking differently: Place 2 sprays into both nostrils daily as needed. ) 16 g 6  . fluticasone (FLOVENT HFA) 44  MCG/ACT inhaler Inhale 2 puffs into the lungs 2 (two) times daily. (Patient taking differently: Inhale 2 puffs into the lungs 2 (two) times daily as needed. ) 1 Inhaler 12  . mupirocin ointment (BACTROBAN) 2 % Apply 1 application topically 2 (two) times daily as needed. 22 g 2  . omeprazole (PRILOSEC) 20 MG capsule Take 1 capsule (20 mg total) by mouth daily. 30 capsule 5  . terazosin (HYTRIN) 1 MG capsule Take 1 capsule (1 mg total) by mouth at bedtime.     No current facility-administered medications for this visit.     PHYSICAL EXAMINATION:  ECOG PERFORMANCE STATUS: 1 - Symptomatic but completely ambulatory   Vitals:   12/01/17 0905  BP: 126/74  Pulse: 67  Resp: 18  Temp: (!) 96.6 F (35.9 C)  SpO2: 98%   Filed Weights   12/01/17 0905  Weight: 251 lb 14.4 oz (114.3 kg)  Physical Exam  Constitutional: He is oriented  to person, place, and time. No distress.  HENT:  Head: Normocephalic and atraumatic.  Right Ear: External ear normal.  Left Ear: External ear normal.  Nose: Nose normal.  Mouth/Throat: Oropharynx is clear and moist. No oropharyngeal exudate.  Eyes: Pupils are equal, round, and reactive to light. Conjunctivae and EOM are normal. Right eye exhibits no discharge. Left eye exhibits no discharge. No scleral icterus.  Neck: Normal range of motion. Neck supple. No JVD present.  Cardiovascular: Normal rate, regular rhythm and normal heart sounds. Exam reveals no friction rub.  No murmur heard. Pulmonary/Chest: Effort normal. No respiratory distress. He has no wheezes. He has no rales. He exhibits no tenderness.    Decreased breath sounds bilaterally.  Abdominal: Soft. He exhibits no distension and no mass. There is no tenderness. There is no rebound and no guarding.  Musculoskeletal: Normal range of motion. He exhibits no edema, tenderness or deformity.  Lymphadenopathy:    He has no cervical adenopathy.  Neurological: He is alert and oriented to person, place, and  time. No cranial nerve deficit. He exhibits normal muscle tone. Coordination normal.  Skin: Skin is warm and dry. No rash noted. He is not diaphoretic. No erythema. No pallor.  Psychiatric: Mood, memory, affect and judgment normal.      LABORATORY DATA: I have personally reviewed the data as listed: CBC    Component Value Date/Time   WBC 4.7 12/01/2017 0805   RBC 4.51 12/01/2017 0805   HGB 12.6 (L) 12/01/2017 0805   HCT 39.3 12/01/2017 0805   PLT 167 12/01/2017 0805   MCV 87.1 12/01/2017 0805   MCH 27.9 12/01/2017 0805   MCHC 32.1 12/01/2017 0805   RDW 13.2 12/01/2017 0805   LYMPHSABS 1.0 12/01/2017 0805   MONOABS 0.3 12/01/2017 0805   EOSABS 0.3 12/01/2017 0805   BASOSABS 0.0 12/01/2017 0805    CMP Latest Ref Rng & Units 12/01/2017 11/17/2017 11/03/2017  Glucose 70 - 99 mg/dL 130(H) 98 134(H)  BUN 8 - 23 mg/dL _0 Creatinine 0.61 - 1.24 mg/dL 1.15 1.06 1.13  Sodium 135 - 145 mmol/L 141 141 141  Potassium 3.5 - 5.1 mmol/L 3.8 4.3 3.9  Chloride 98 - 111 mmol/L 110 109 110  CO2 22 - 32 mmol/L _1 Calcium 8.9 - 10.3 mg/dL 9.0 9.1 9.0  Total Protein 6.5 - 8.1 g/dL 7.1 7.4 7.3  Total Bilirubin 0.3 - 1.2 mg/dL 0.7 0.8 0.8  Alkaline Phos 38 - 126 U/L 61 58 67  AST 15 - 41 U/L _2 ALT 0 - 44 U/L _3 RADIOGRAPHIC STUDIES: I have personally reviewed the radiological images as listed and agreed with the findings in the report. 09/07/2017 CT chest abdomen pelvis w contrast Stable exam of the chest. No new lesion or progressive interval finding.  Post radiation changes. No metastatic disease.  Renal cysts, aortic atherosclerosis.   09/07/2017 Bone scan: no metastatic disease. Degenerative disease.   RADIOGRAPHIC STUDIES: I have personally reviewed the radiological images as listed and agreed with the findings in the report. 10/20/2017 Chest CXR  PowerPort catheter noted with tip coiled in the superior vena cava, similar findings noted on prior exam.  2.  Persistent left upper lung infiltrate consistent with radiation changes. Similar findings noted on prior exams.  ASSESSMENT/PLAN Cancer Staging Squamous cell lung cancer, left (HCC) Staging form: Lung, AJCC 8th Edition - Clinical stage from 09/27/2016: Stage IIIA (cT2a, cN2, cM0) - Signed  by Earlie Server, MD on 09/29/2016  1. Squamous cell lung cancer, left (HCC)   #Squamous cell lung cancer, stable disease. CT chest on 11/24/2017 was independently reviewed by me and discussed with patient. Similar findings of radiation-induced consolidation in the medial left upper lobe.  No findings of recurrent or metastatic disease.  Left infrahilar node is similar and upper normal size. Looping of right side Port-A-Cath within the right internal jugular vein.  Aortic atherosclerosis.  Emphysema, coronary artery atherosclerosis. Esophageal air-fluid level suggest this mobility or gastroesophageal reflux. Labs reviewed and discussed with patient. Proceed with maintenance durvalumab today.  # Chronic lymphocytopenia.  Resolved. # Anemia, chronic. Stable.   Follow-up in 2 weeks for the next cycle of durvalumab.Marland Kitchen   Earlie Server, MD, PhD Hematology Oncology [facility] at [facility] Pager- [phone] 12/01/2017

## 2017-12-01 NOTE — Progress Notes (Signed)
Radiation Oncology Follow up Note  Name: [name]   Date:   12/01/2017 MRN:  [id number] DOB: 11/13/1945    This 72 y.o. male presents to the clinic today for ten-month follow-up status post concurrent chemoradiation therapy for stage IIIa squamous cell carcinoma the left lung.  REFERRING PROVIDER: Adline Potter, MD  HPI: patient is a 72 year old male now out 10 months having completed concurrent chemoradiation for stage IIIa squamous cell carcinoma the left lung. He is seen today in routine follow-up is doing well. He specifically denies cough hemoptysis or chest tightness P when take is good weight is stable. He is currently on maintenance. Durvalumab which she is tolerating well. Had a recent CT scan showing radiation consolidation in the medial left upper lobe no findings to suggest recurrent or metastatic disease. There is a left infrahilar lymph node which warrants follow-up attention.  COMPLICATIONS OF TREATMENT: none  FOLLOW UP COMPLIANCE: keeps appointments   PHYSICAL EXAM:  BP (!) 141/71 (BP Location: Left Arm, Patient Position: Sitting)   Pulse 64   Temp (!) 95.8 F (35.4 C) (Tympanic)   Resp 18   Wt 251 lb 14 oz (114.3 kg)   BMI 35.13 kg/m  Well-developed well-nourished patient in NAD. HEENT reveals PERLA, EOMI, discs not visualized.  Oral cavity is clear. No oral mucosal lesions are identified. Neck is clear without evidence of cervical or supraclavicular adenopathy. Lungs are clear to A&P. Cardiac examination is essentially unremarkable with regular rate and rhythm without murmur rub or thrill. Abdomen is benign with no organomegaly or masses noted. Motor sensory and DTR levels are equal and symmetric in the upper and lower extremities. Cranial nerves II through XII are grossly intact. Proprioception is intact. No peripheral adenopathy or edema is identified. No motor or sensory levels are noted. Crude visual fields are within normal range.  RADIOLOGY RESULTS: CT scan  is reviewed and compatible above-stated findings  PLAN: present time patient is doing well under excellent control currently on maintenance immunotherapy. I'm please was overall progress. I've independently reviewed his CT scans. I've asked to see the patient back in 1 year for follow-up. He continues close follow-up care and treatment under medical oncology's direction. Patient is to call with any concerns.  I would like to take this opportunity to thank you for allowing me to participate in the care of your patient.Noreene Filbert, MD

## 2017-12-15 ENCOUNTER — Encounter: Payer: Self-pay | Admitting: Oncology

## 2017-12-15 ENCOUNTER — Inpatient Hospital Stay: Payer: Medicare HMO

## 2017-12-15 ENCOUNTER — Inpatient Hospital Stay (HOSPITAL_BASED_OUTPATIENT_CLINIC_OR_DEPARTMENT_OTHER): Payer: Medicare HMO | Admitting: Oncology

## 2017-12-15 ENCOUNTER — Inpatient Hospital Stay: Payer: Medicare HMO | Attending: Oncology

## 2017-12-15 ENCOUNTER — Other Ambulatory Visit: Payer: Self-pay

## 2017-12-15 VITALS — BP 133/79 | HR 64 | Temp 96.6°F | Resp 18 | Wt 256.8 lb

## 2017-12-15 DIAGNOSIS — Z79899 Other long term (current) drug therapy: Secondary | ICD-10-CM | POA: Insufficient documentation

## 2017-12-15 DIAGNOSIS — Z923 Personal history of irradiation: Secondary | ICD-10-CM | POA: Insufficient documentation

## 2017-12-15 DIAGNOSIS — Z9221 Personal history of antineoplastic chemotherapy: Secondary | ICD-10-CM | POA: Insufficient documentation

## 2017-12-15 DIAGNOSIS — Z87891 Personal history of nicotine dependence: Secondary | ICD-10-CM

## 2017-12-15 DIAGNOSIS — R5383 Other fatigue: Secondary | ICD-10-CM | POA: Insufficient documentation

## 2017-12-15 DIAGNOSIS — Z5112 Encounter for antineoplastic immunotherapy: Secondary | ICD-10-CM | POA: Insufficient documentation

## 2017-12-15 DIAGNOSIS — C3412 Malignant neoplasm of upper lobe, left bronchus or lung: Secondary | ICD-10-CM | POA: Diagnosis present

## 2017-12-15 DIAGNOSIS — R059 Cough, unspecified: Secondary | ICD-10-CM

## 2017-12-15 DIAGNOSIS — C3492 Malignant neoplasm of unspecified part of left bronchus or lung: Secondary | ICD-10-CM

## 2017-12-15 DIAGNOSIS — R05 Cough: Secondary | ICD-10-CM | POA: Diagnosis not present

## 2017-12-15 DIAGNOSIS — D649 Anemia, unspecified: Secondary | ICD-10-CM | POA: Diagnosis not present

## 2017-12-15 LAB — COMPREHENSIVE METABOLIC PANEL
ALT: 21 U/L (ref 0–44)
AST: 20 U/L (ref 15–41)
Albumin: 3.8 g/dL (ref 3.5–5.0)
Alkaline Phosphatase: 60 U/L (ref 38–126)
Anion gap: 6 (ref 5–15)
BILIRUBIN TOTAL: 0.7 mg/dL (ref 0.3–1.2)
BUN: 16 mg/dL (ref 8–23)
CO2: 23 mmol/L (ref 22–32)
CREATININE: 1.04 mg/dL (ref 0.61–1.24)
Calcium: 8.9 mg/dL (ref 8.9–10.3)
Chloride: 109 mmol/L (ref 98–111)
GFR calc Af Amer: >60 mL/min (ref >60)
GLUCOSE: 110 mg/dL — AB (ref 70–99)
POTASSIUM: 3.9 mmol/L (ref 3.5–5.1)
SODIUM: 138 mmol/L (ref 135–145)
TOTAL PROTEIN: 7.2 g/dL (ref 6.5–8.1)

## 2017-12-15 LAB — CBC WITH DIFFERENTIAL/PLATELET
ABS IMMATURE GRANULOCYTES: 0.02 10*3/uL (ref 0.00–0.07)
BASOS PCT: 0 %
Basophils Absolute: 0 10*3/uL (ref 0.0–0.1)
EOS PCT: 6 %
Eosinophils Absolute: 0.3 10*3/uL (ref 0.0–0.5)
HCT: 39.6 % (ref 39.0–52.0)
HEMOGLOBIN: 12.5 g/dL — AB (ref 13.0–17.0)
Immature Granulocytes: 0 %
LYMPHS PCT: 21 %
Lymphs Abs: 1 10*3/uL (ref 0.7–4.0)
MCH: 27.7 pg (ref 26.0–34.0)
MCHC: 31.6 g/dL (ref 30.0–36.0)
MCV: 87.8 fL (ref 80.0–100.0)
MONO ABS: 0.5 10*3/uL (ref 0.1–1.0)
MONOS PCT: 9 %
NEUTROS ABS: 3 10*3/uL (ref 1.7–7.7)
Neutrophils Relative %: 64 %
Platelets: 152 10*3/uL (ref 150–400)
RBC: 4.51 MIL/uL (ref 4.22–5.81)
RDW: 13.2 % (ref 11.5–15.5)
WBC: 4.8 10*3/uL (ref 4.0–10.5)
nRBC: 0 % (ref 0.0–0.2)

## 2017-12-15 MED ORDER — LIDOCAINE-PRILOCAINE 2.5-2.5 % EX CREA: 1.0000 "application " | TOPICAL_CREAM | CUTANEOUS | 1 refills | Status: DC | PRN

## 2017-12-15 MED ORDER — HEPARIN SOD (PORK) LOCK FLUSH 100 UNIT/ML IV SOLN
500.0000 [IU] | Freq: Once | INTRAVENOUS | Status: AC | PRN
  Administered 2017-12-15: 500 [IU]
  Filled 2017-12-15: qty 5

## 2017-12-15 MED ORDER — LIDOCAINE-PRILOCAINE 2.5-2.5 % EX CREA: 1.0000 "application " | TOPICAL_CREAM | CUTANEOUS | 3 refills | Status: AC | PRN

## 2017-12-15 MED ORDER — SODIUM CHLORIDE 0.9 % IV SOLN
Freq: Once | INTRAVENOUS | Status: AC
  Administered 2017-12-15: 09:00:00 via INTRAVENOUS
  Filled 2017-12-15: qty 250

## 2017-12-15 MED ORDER — SODIUM CHLORIDE 0.9 % IV SOLN
10.0000 mg/kg | Freq: Once | INTRAVENOUS | Status: AC
  Administered 2017-12-15: 1120 mg via INTRAVENOUS
  Filled 2017-12-15: qty 20

## 2017-12-15 NOTE — Progress Notes (Signed)
Patient here for follow up. Pt requesting refill on lidocaine cream.

## 2017-12-15 NOTE — Progress Notes (Signed)
[place] [facility] Follow Up Visit  Patient Care Team: Glean Hess, MD as PCP - General (Internal Medicine) Earlie Server, MD as Consulting Physician (Oncology)  REASON FOR VISIT:  Follow up for evaluation prior to maintenance immunotherapy  HISTORY OF PRESENT ILLNESS/ PERTINENT ONCOLOGY HISTORY #  Stage IIIA lung squamous cell cancer. S/p concurrent chemotherapy and Radiation.[ completed Oct 2018], started on Durvalumab in Nov 2019.  06/09/17 CT chest. scan was presented on tumor board on 06/22/2017, consensus is stable disease, with postradiation changes. Monitor # Pathology: TPS 50%. 09/22/2016 left upper lobe biopsy showed squamous cell carcinoma. Foundation one test: ordered prior to patient first visit. Showed NO EGFR, ROS1, ALK, BRAF, MET mutation.   Cancer Treatment: S/p concurrent carbo/taxol and Radiation.  Currently Durvalumab Q2 weeks. Treatment was interrupted after cycle 5 due to radiation pneumonitis. After steroid treatment, back on Durvalumab Q2weeks.   INTERVAL HISTORY Today [name] presented for follow up for assessment prior to immunotherapy maintenance for treatment of lung squamous cell carcinoma stage IIIA  #Patient has been doing well on immunotherapy.  Tolerates well.  Denies any nausea, vomiting, skin rash or diarrhea.  Cough is chronic and is pretty much at baseline.  No worsening symptoms.  He has had flu shot last week. Fatigue is chronic at baseline.  He continues to work as a Administrator.     Review of Systems  Constitutional: Positive for fatigue. Negative for appetite change, chills, diaphoresis, fever and unexpected weight change.  HENT:   Negative for hearing loss, lump/mass, mouth sores, nosebleeds and sore throat.   Eyes: Negative for eye problems and icterus.  Respiratory: Positive for cough. Negative for chest tightness, hemoptysis, shortness of breath and wheezing.   Cardiovascular: Negative for chest pain and leg swelling.   Gastrointestinal: Negative for abdominal distention, abdominal pain, blood in stool, diarrhea, nausea and rectal pain.  Endocrine: Negative for hot flashes.  Genitourinary: Negative for bladder incontinence, difficulty urinating, discharge, dysuria, frequency, hematuria, nocturia and pelvic pain.   Musculoskeletal: Negative for back pain, flank pain, gait problem, myalgias and neck pain.  Skin: Negative for itching, rash and wound.  Neurological: Negative for dizziness, gait problem, headaches, light-headedness, numbness and seizures.  Hematological: Negative for adenopathy. Does not bruise/bleed easily.  Psychiatric/Behavioral: Negative for confusion and decreased concentration. The patient is not nervous/anxious.       MEDICAL HISTORY: Past Medical History:  Diagnosis Date  . Enlarged prostate   . Squamous cell lung cancer, left ([place]) 08/2016    SURGICAL HISTORY: Past Surgical History:  Procedure Laterality Date  . NO PAST SURGERIES    . PORTACATH PLACEMENT Right 10/05/2016   Procedure: INSERTION PORT-A-CATH;  Surgeon: [name], MD;  Location: [facility] ORS;  Service: General;  Laterality: Right;    SOCIAL HISTORY: Social History   Socioeconomic History  . Marital status: Single    Spouse name: Not on file  . Number of children: Not on file  . Years of education: Not on file  . Highest education level: Not on file  Occupational History  . Not on file  Social Needs  . Financial resource strain: Not on file  . Food insecurity:    Worry: Not on file    Inability: Not on file  . Transportation needs:    Medical: Not on file    Non-medical: Not on file  Tobacco Use  . Smoking status: Former Smoker    Packs/day: 1.00    Years: 15.00  Pack years: 15.00    Types: Cigarettes    Last attempt to quit: 09/28/1992    Years since quitting: 25.2  . Smokeless tobacco: Never Used  Substance and Sexual Activity  . Alcohol use: No  . Drug use: No  . Sexual activity: Not  on file  Lifestyle  . Physical activity:    Days per week: Not on file    Minutes per session: Not on file  . Stress: Not on file  Relationships  . Social connections:    Talks on phone: Not on file    Gets together: Not on file    Attends religious service: Not on file    Active member of club or organization: Not on file    Attends meetings of clubs or organizations: Not on file    Relationship status: Not on file  . Intimate partner violence:    Fear of current or ex partner: Not on file    Emotionally abused: Not on file    Physically abused: Not on file    Forced sexual activity: Not on file  Other Topics Concern  . Not on file  Social History Narrative  . Not on file    FAMILY HISTORY Family History  Problem Relation Age of Onset  . Lymphoma Mother   . Diabetes Mother   . Lung cancer Sister   . Lung cancer Brother   . Lung cancer Brother     ALLERGIES:  has No Known Allergies.  MEDICATIONS:  Current Outpatient Medications  Medication Sig Dispense Refill  . acetaminophen (TYLENOL) 500 MG tablet Take 1,000 mg by mouth every 4 (four) hours as needed.     Marland Kitchen albuterol (PROVENTIL HFA;VENTOLIN HFA) 108 (90 Base) MCG/ACT inhaler Inhale 2 puffs into the lungs every 6 (six) hours as needed for wheezing or shortness of breath. 1 Inhaler 2  . benzonatate (TESSALON PERLES) 100 MG capsule Take 1 capsule (100 mg total) by mouth 3 (three) times daily as needed for cough. 30 capsule 0  . Cholecalciferol (VITAMIN D3) 2000 units capsule Take 1 capsule (2,000 Units total) by mouth daily.    . cyanocobalamin 500 MCG tablet Take 500 mcg by mouth daily.    Marland Kitchen Dextromethorphan-guaiFENesin 10-100 MG/5ML liquid Take 5 mLs by mouth every 12 (twelve) hours. 473 mL 1  . fluticasone (FLONASE) 50 MCG/ACT nasal spray Place 2 sprays into both nostrils daily. (Patient taking differently: Place 2 sprays into both nostrils daily as needed. ) 16 g 6  . fluticasone (FLOVENT HFA) 44 MCG/ACT inhaler  Inhale 2 puffs into the lungs 2 (two) times daily. (Patient taking differently: Inhale 2 puffs into the lungs 2 (two) times daily as needed. ) 1 Inhaler 12  . lidocaine-prilocaine (EMLA) cream Apply 1 application topically as needed. 30 g 3  . mupirocin ointment (BACTROBAN) 2 % Apply 1 application topically 2 (two) times daily as needed. 22 g 2  . omeprazole (PRILOSEC) 20 MG capsule Take 1 capsule (20 mg total) by mouth daily. 30 capsule 5  . terazosin (HYTRIN) 1 MG capsule Take 1 capsule (1 mg total) by mouth at bedtime.     No current facility-administered medications for this visit.     PHYSICAL EXAMINATION:  ECOG PERFORMANCE STATUS: 0 - Asymptomatic   Vitals:   12/15/17 0832  BP: 133/79  Pulse: 64  Resp: 18  Temp: (!) 96.6 F (35.9 C)  SpO2: 100%   Filed Weights   12/15/17 0832  Weight: 256 lb 12.8  oz (116.5 kg)  Physical Exam  Constitutional: He is oriented to person, place, and time. No distress.  HENT:  Head: Normocephalic and atraumatic.  Right Ear: External ear normal.  Left Ear: External ear normal.  Nose: Nose normal.  Mouth/Throat: Oropharynx is clear and moist. No oropharyngeal exudate.  Eyes: Pupils are equal, round, and reactive to light. Conjunctivae and EOM are normal. Right eye exhibits no discharge. Left eye exhibits no discharge. No scleral icterus.  Neck: Normal range of motion. Neck supple. No JVD present.  Cardiovascular: Normal rate, regular rhythm and normal heart sounds. Exam reveals no friction rub.  No murmur heard. Pulmonary/Chest: Effort normal. No respiratory distress. He has no wheezes. He has no rales. He exhibits no tenderness.    Decreased breath sounds bilaterally.  Abdominal: Soft. He exhibits no distension and no mass. There is no tenderness. There is no rebound and no guarding.  Musculoskeletal: Normal range of motion. He exhibits no edema, tenderness or deformity.  Lymphadenopathy:    He has no cervical adenopathy.  Neurological: He  is alert and oriented to person, place, and time. No cranial nerve deficit. He exhibits normal muscle tone. Coordination normal.  Skin: Skin is warm and dry. No rash noted. He is not diaphoretic. No erythema. No pallor.  Psychiatric: Mood, memory, affect and judgment normal.      LABORATORY DATA: I have personally reviewed the data as listed: CBC    Component Value Date/Time   WBC 4.8 12/15/2017 0807   RBC 4.51 12/15/2017 0807   HGB 12.5 (L) 12/15/2017 0807   HCT 39.6 12/15/2017 0807   PLT 152 12/15/2017 0807   MCV 87.8 12/15/2017 0807   MCH 27.7 12/15/2017 0807   MCHC 31.6 12/15/2017 0807   RDW 13.2 12/15/2017 0807   LYMPHSABS 1.0 12/15/2017 0807   MONOABS 0.5 12/15/2017 0807   EOSABS 0.3 12/15/2017 0807   BASOSABS 0.0 12/15/2017 0807    CMP Latest Ref Rng & Units 12/15/2017 12/01/2017 11/17/2017  Glucose 70 - 99 mg/dL 110(H) 130(H) 98  BUN 8 - 23 mg/dL '16 17 18  ' Creatinine 0.61 - 1.24 mg/dL 1.04 1.15 1.06  Sodium 135 - 145 mmol/L 138 141 141  Potassium 3.5 - 5.1 mmol/L 3.9 3.8 4.3  Chloride 98 - 111 mmol/L 109 110 109  CO2 22 - 32 mmol/L '23 25 25  ' Calcium 8.9 - 10.3 mg/dL 8.9 9.0 9.1  Total Protein 6.5 - 8.1 g/dL 7.2 7.1 7.4  Total Bilirubin 0.3 - 1.2 mg/dL 0.7 0.7 0.8  Alkaline Phos 38 - 126 U/L 60 61 58  AST 15 - 41 U/L '20 22 15  ' ALT 0 - 44 U/L '21 17 13   ' RADIOGRAPHIC STUDIES: I have personally reviewed the radiological images as listed and agreed with the findings in the report. # 09/07/2017 CT chest abdomen pelvis w contrast Stable exam of the chest. No new lesion or progressive interval finding.  Post radiation changes. No metastatic disease.  Renal cysts, aortic atherosclerosis.  # 09/07/2017 Bone scan: no metastatic disease. Degenerative disease.  10/20/2017 Chest CXR  # PowerPort catheter noted with tip coiled in the superior vena cava, similar findings noted on prior exam. Persistent left upper lung infiltrate consistent with radiation changes. Similar findings  noted on prior exams. # CT chest on 11/24/2017 Similar findings of radiation-induced consolidation in the medial left upper lobe.  No findings of recurrent or metastatic disease.  Left infrahilar node is similar and upper normal size. Looping of right  side Port-A-Cath within the right internal jugular vein.  Aortic atherosclerosis.  Emphysema, coronary artery atherosclerosis. Esophageal air-fluid level suggest this mobility or gastroesophageal reflux  ASSESSMENT/PLAN Cancer Staging Squamous cell lung cancer, left ([place]) Staging form: Lung, AJCC 8th Edition - Clinical stage from 09/27/2016: Stage IIIA (cT2a, cN2, cM0) - Signed by Earlie Server, MD on 09/29/2016  1. Squamous cell lung cancer, left ([place])   2. Encounter for antineoplastic immunotherapy   3. Cough   #Squamous cell lung cancer, stable disease. Labs are reviewed and discussed with patient.  He tolerates Imfinzi every 2 weeks well. Today is his cycle 22.  Counts are acceptable to proceed with today's Imfinzi treatment.  We will continue and finish total of 26 cycles of Imfinzi. Discussed with the patient we will do next CT scan 3 months from his recent CT.  # Anemia, chronic.  Stable hemoglobin. #Chronic cough, stable.  Follow-up in 2 weeks prior to the assessment for next cycle of infancy. Total face to face encounter time for this patient visit was 25 min. >50% of the time was  spent in counseling and coordination of care.   Earlie Server, MD, PhD Hematology Oncology [facility] Pager- [phone] 12/15/2017

## 2018-01-05 ENCOUNTER — Inpatient Hospital Stay (HOSPITAL_BASED_OUTPATIENT_CLINIC_OR_DEPARTMENT_OTHER): Payer: Medicare HMO | Admitting: Oncology

## 2018-01-05 ENCOUNTER — Inpatient Hospital Stay: Payer: Medicare HMO

## 2018-01-05 ENCOUNTER — Encounter: Payer: Self-pay | Admitting: Oncology

## 2018-01-05 ENCOUNTER — Other Ambulatory Visit: Payer: Self-pay

## 2018-01-05 VITALS — BP 127/74 | HR 81 | Temp 96.5°F | Wt 254.7 lb

## 2018-01-05 DIAGNOSIS — R5383 Other fatigue: Secondary | ICD-10-CM

## 2018-01-05 DIAGNOSIS — Z5112 Encounter for antineoplastic immunotherapy: Secondary | ICD-10-CM

## 2018-01-05 DIAGNOSIS — D649 Anemia, unspecified: Secondary | ICD-10-CM | POA: Diagnosis not present

## 2018-01-05 DIAGNOSIS — R042 Hemoptysis: Secondary | ICD-10-CM | POA: Diagnosis not present

## 2018-01-05 DIAGNOSIS — C3492 Malignant neoplasm of unspecified part of left bronchus or lung: Secondary | ICD-10-CM

## 2018-01-05 DIAGNOSIS — Z923 Personal history of irradiation: Secondary | ICD-10-CM

## 2018-01-05 DIAGNOSIS — Z79899 Other long term (current) drug therapy: Secondary | ICD-10-CM

## 2018-01-05 DIAGNOSIS — Z9221 Personal history of antineoplastic chemotherapy: Secondary | ICD-10-CM

## 2018-01-05 DIAGNOSIS — R05 Cough: Secondary | ICD-10-CM | POA: Diagnosis not present

## 2018-01-05 DIAGNOSIS — C3412 Malignant neoplasm of upper lobe, left bronchus or lung: Secondary | ICD-10-CM

## 2018-01-05 DIAGNOSIS — Z87891 Personal history of nicotine dependence: Secondary | ICD-10-CM

## 2018-01-05 DIAGNOSIS — R059 Cough, unspecified: Secondary | ICD-10-CM

## 2018-01-05 LAB — COMPREHENSIVE METABOLIC PANEL
ALT: 16 U/L (ref 0–44)
AST: 19 U/L (ref 15–41)
Albumin: 4 g/dL (ref 3.5–5.0)
Alkaline Phosphatase: 67 U/L (ref 38–126)
Anion gap: 5 (ref 5–15)
BUN: 18 mg/dL (ref 8–23)
CO2: 25 mmol/L (ref 22–32)
Calcium: 9 mg/dL (ref 8.9–10.3)
Chloride: 109 mmol/L (ref 98–111)
Creatinine, Ser: 1.1 mg/dL (ref 0.61–1.24)
Glucose, Bld: 124 mg/dL — ABNORMAL HIGH (ref 70–99)
POTASSIUM: 3.8 mmol/L (ref 3.5–5.1)
Sodium: 139 mmol/L (ref 135–145)
Total Bilirubin: 0.8 mg/dL (ref 0.3–1.2)
Total Protein: 7.4 g/dL (ref 6.5–8.1)

## 2018-01-05 LAB — CBC WITH DIFFERENTIAL/PLATELET
ABS IMMATURE GRANULOCYTES: 0.01 10*3/uL (ref 0.00–0.07)
BASOS ABS: 0 10*3/uL (ref 0.0–0.1)
Basophils Relative: 0 %
Eosinophils Absolute: 0.3 10*3/uL (ref 0.0–0.5)
Eosinophils Relative: 5 %
HCT: 40.4 % (ref 39.0–52.0)
HEMOGLOBIN: 12.9 g/dL — AB (ref 13.0–17.0)
IMMATURE GRANULOCYTES: 0 %
Lymphocytes Relative: 19 %
Lymphs Abs: 1 10*3/uL (ref 0.7–4.0)
MCH: 27.6 pg (ref 26.0–34.0)
MCHC: 31.9 g/dL (ref 30.0–36.0)
MCV: 86.5 fL (ref 80.0–100.0)
Monocytes Absolute: 0.4 10*3/uL (ref 0.1–1.0)
Monocytes Relative: 8 %
NEUTROS ABS: 3.4 10*3/uL (ref 1.7–7.7)
NRBC: 0 % (ref 0.0–0.2)
Neutrophils Relative %: 68 %
Platelets: 160 10*3/uL (ref 150–400)
RBC: 4.67 MIL/uL (ref 4.22–5.81)
RDW: 12.9 % (ref 11.5–15.5)
WBC: 5 10*3/uL (ref 4.0–10.5)

## 2018-01-05 LAB — TSH: TSH: 1.822 u[IU]/mL (ref 0.350–4.500)

## 2018-01-05 MED ORDER — SODIUM CHLORIDE 0.9 % IV SOLN
10.0000 mg/kg | Freq: Once | INTRAVENOUS | Status: AC
  Administered 2018-01-05: 1120 mg via INTRAVENOUS
  Filled 2018-01-05: qty 20

## 2018-01-05 MED ORDER — HEPARIN SOD (PORK) LOCK FLUSH 100 UNIT/ML IV SOLN
500.0000 [IU] | Freq: Once | INTRAVENOUS | Status: AC
  Administered 2018-01-05: 500 [IU] via INTRAVENOUS
  Filled 2018-01-05: qty 5

## 2018-01-05 MED ORDER — HEPARIN SOD (PORK) LOCK FLUSH 100 UNIT/ML IV SOLN: 500.0000 [IU] | Freq: Once | INTRAVENOUS | Status: DC | PRN

## 2018-01-05 MED ORDER — SODIUM CHLORIDE 0.9 % IV SOLN
Freq: Once | INTRAVENOUS | Status: AC
  Administered 2018-01-05: 09:00:00 via INTRAVENOUS
  Filled 2018-01-05: qty 250

## 2018-01-05 MED ORDER — SODIUM CHLORIDE 0.9% FLUSH
10.0000 mL | INTRAVENOUS | Status: DC | PRN
  Administered 2018-01-05: 10 mL via INTRAVENOUS
  Filled 2018-01-05: qty 10

## 2018-01-05 NOTE — Progress Notes (Signed)
Patient here for follow. About 2 weeks ago pt was coughing up bright red blood that only lasted one day. Problem has resolved.

## 2018-01-05 NOTE — Progress Notes (Signed)
[place] [facility] Follow Up Visit  Patient Care Team: Glean Hess, MD as PCP - General (Internal Medicine) Earlie Server, MD as Consulting Physician (Oncology)  REASON FOR VISIT:  Follow up for evaluation prior to maintenance immunotherapy  HISTORY OF PRESENT ILLNESS/ PERTINENT ONCOLOGY HISTORY #  Stage IIIA lung squamous cell cancer. S/p concurrent chemotherapy and Radiation.[ completed Oct 2018], started on Durvalumab in Nov 2019.  06/09/17 CT chest. scan was presented on tumor board on 06/22/2017, consensus is stable disease, with postradiation changes. Monitor # Pathology: TPS 50%. 09/22/2016 left upper lobe biopsy showed squamous cell carcinoma. Foundation one test: ordered prior to patient first visit. Showed NO EGFR, ROS1, ALK, BRAF, MET mutation.   Cancer Treatment: S/p concurrent carbo/taxol and Radiation.  Currently Durvalumab Q2 weeks. Treatment was interrupted after cycle 5 due to radiation pneumonitis. After steroid treatment, back on Durvalumab Q2weeks.   INTERVAL HISTORY Today [name] presented for follow up for assessment prior to immunotherapy maintenance for treatment of lung squamous cell carcinoma stage IIIA  #Patient has been doing well on immunotherapy.  Tolerates well.  Denies any nausea vomiting skin rash or diarrhea. He reports one episode of coughing out very small amount of blood tinged sputum. No additional episodes. Cough is chronic and pretty much at baseline. Fatigue is at baseline.  Continue to work as a Administrator.    Review of Systems  Constitutional: Positive for fatigue. Negative for appetite change, chills, fever and unexpected weight change.  HENT:   Negative for hearing loss and voice change.   Eyes: Negative for eye problems and icterus.  Respiratory: Positive for cough. Negative for chest tightness and shortness of breath.   Cardiovascular: Negative for chest pain and leg swelling.  Gastrointestinal: Negative for  abdominal distention and abdominal pain.  Endocrine: Negative for hot flashes.  Genitourinary: Negative for difficulty urinating, dysuria and frequency.   Musculoskeletal: Negative for arthralgias.  Skin: Negative for itching and rash.  Neurological: Negative for light-headedness and numbness.  Hematological: Negative for adenopathy. Does not bruise/bleed easily.  Psychiatric/Behavioral: Negative for confusion.    MEDICAL HISTORY: Past Medical History:  Diagnosis Date  . Enlarged prostate   . Squamous cell lung cancer, left ([place]) 08/2016    SURGICAL HISTORY: Past Surgical History:  Procedure Laterality Date  . NO PAST SURGERIES    . PORTACATH PLACEMENT Right 10/05/2016   Procedure: INSERTION PORT-A-CATH;  Surgeon: [name], MD;  Location: [facility] ORS;  Service: General;  Laterality: Right;    SOCIAL HISTORY: Social History   Socioeconomic History  . Marital status: Single    Spouse name: Not on file  . Number of children: Not on file  . Years of education: Not on file  . Highest education level: Not on file  Occupational History  . Not on file  Social Needs  . Financial resource strain: Not on file  . Food insecurity:    Worry: Not on file    Inability: Not on file  . Transportation needs:    Medical: Not on file    Non-medical: Not on file  Tobacco Use  . Smoking status: Former Smoker    Packs/day: 1.00    Years: 15.00    Pack years: 15.00    Types: Cigarettes    Last attempt to quit: 09/28/1992    Years since quitting: 25.2  . Smokeless tobacco: Never Used  Substance and Sexual Activity  . Alcohol use: No  . Drug use: No  .  Sexual activity: Not on file  Lifestyle  . Physical activity:    Days per week: Not on file    Minutes per session: Not on file  . Stress: Not on file  Relationships  . Social connections:    Talks on phone: Not on file    Gets together: Not on file    Attends religious service: Not on file    Active member of club or  organization: Not on file    Attends meetings of clubs or organizations: Not on file    Relationship status: Not on file  . Intimate partner violence:    Fear of current or ex partner: Not on file    Emotionally abused: Not on file    Physically abused: Not on file    Forced sexual activity: Not on file  Other Topics Concern  . Not on file  Social History Narrative  . Not on file    FAMILY HISTORY Family History  Problem Relation Age of Onset  . Lymphoma Mother   . Diabetes Mother   . Lung cancer Sister   . Lung cancer Brother   . Lung cancer Brother     ALLERGIES:  has No Known Allergies.  MEDICATIONS:  Current Outpatient Medications  Medication Sig Dispense Refill  . acetaminophen (TYLENOL) 500 MG tablet Take 1,000 mg by mouth every 4 (four) hours as needed.     Marland Kitchen albuterol (PROVENTIL HFA;VENTOLIN HFA) 108 (90 Base) MCG/ACT inhaler Inhale 2 puffs into the lungs every 6 (six) hours as needed for wheezing or shortness of breath. 1 Inhaler 2  . benzonatate (TESSALON PERLES) 100 MG capsule Take 1 capsule (100 mg total) by mouth 3 (three) times daily as needed for cough. 30 capsule 0  . Cholecalciferol (VITAMIN D3) 2000 units capsule Take 1 capsule (2,000 Units total) by mouth daily.    . cyanocobalamin 500 MCG tablet Take 500 mcg by mouth daily.    Marland Kitchen Dextromethorphan-guaiFENesin 10-100 MG/5ML liquid Take 5 mLs by mouth every 12 (twelve) hours. 473 mL 1  . fluticasone (FLONASE) 50 MCG/ACT nasal spray Place 2 sprays into both nostrils daily. (Patient taking differently: Place 2 sprays into both nostrils daily as needed. ) 16 g 6  . fluticasone (FLOVENT HFA) 44 MCG/ACT inhaler Inhale 2 puffs into the lungs 2 (two) times daily. (Patient taking differently: Inhale 2 puffs into the lungs 2 (two) times daily as needed. ) 1 Inhaler 12  . lidocaine-prilocaine (EMLA) cream Apply 1 application topically as needed. 30 g 3  . mupirocin ointment (BACTROBAN) 2 % Apply 1 application topically 2  (two) times daily as needed. 22 g 2  . omeprazole (PRILOSEC) 20 MG capsule Take 1 capsule (20 mg total) by mouth daily. 30 capsule 5  . terazosin (HYTRIN) 1 MG capsule Take 1 capsule (1 mg total) by mouth at bedtime.     No current facility-administered medications for this visit.     PHYSICAL EXAMINATION:  ECOG PERFORMANCE STATUS: 0 - Asymptomatic   Vitals:   01/05/18 0832  BP: 127/74  Pulse: 81  Temp: (!) 96.5 F (35.8 C)  SpO2: 97%   Filed Weights   01/05/18 0832  Weight: 254 lb 11.2 oz (115.5 kg)  Physical Exam  Constitutional: He is oriented to person, place, and time. No distress.  HENT:  Head: Normocephalic and atraumatic.  Mouth/Throat: No oropharyngeal exudate.  Eyes: Pupils are equal, round, and reactive to light. Conjunctivae and EOM are normal.  Neck: Normal range  of motion. Neck supple.  Cardiovascular: Normal rate, regular rhythm and normal heart sounds. Exam reveals no friction rub.  No murmur heard. Pulmonary/Chest: Effort normal. No respiratory distress. He has no wheezes. He has no rales. He exhibits no tenderness.    Decreased breath sounds bilaterally.  Abdominal: Soft. He exhibits no distension. There is no tenderness.  Musculoskeletal: Normal range of motion. He exhibits no edema, tenderness or deformity.  Neurological: He is alert and oriented to person, place, and time.  Skin: Skin is warm and dry. No erythema.  Psychiatric: Mood, memory, affect and judgment normal.      LABORATORY DATA: I have personally reviewed the data as listed: CBC    Component Value Date/Time   WBC 5.0 01/05/2018 0801   RBC 4.67 01/05/2018 0801   HGB 12.9 (L) 01/05/2018 0801   HCT 40.4 01/05/2018 0801   PLT 160 01/05/2018 0801   MCV 86.5 01/05/2018 0801   MCH 27.6 01/05/2018 0801   MCHC 31.9 01/05/2018 0801   RDW 12.9 01/05/2018 0801   LYMPHSABS 1.0 01/05/2018 0801   MONOABS 0.4 01/05/2018 0801   EOSABS 0.3 01/05/2018 0801   BASOSABS 0.0 01/05/2018 0801     CMP Latest Ref Rng & Units 01/05/2018 12/15/2017 12/01/2017  Glucose 70 - 99 mg/dL 124(H) 110(H) 130(H)  BUN 8 - 23 mg/dL '18 16 17  ' Creatinine 0.61 - 1.24 mg/dL 1.10 1.04 1.15  Sodium 135 - 145 mmol/L 139 138 141  Potassium 3.5 - 5.1 mmol/L 3.8 3.9 3.8  Chloride 98 - 111 mmol/L 109 109 110  CO2 22 - 32 mmol/L '25 23 25  ' Calcium 8.9 - 10.3 mg/dL 9.0 8.9 9.0  Total Protein 6.5 - 8.1 g/dL 7.4 7.2 7.1  Total Bilirubin 0.3 - 1.2 mg/dL 0.8 0.7 0.7  Alkaline Phos 38 - 126 U/L 67 60 61  AST 15 - 41 U/L '19 20 22  ' ALT 0 - 44 U/L '16 21 17   ' RADIOGRAPHIC STUDIES: I have personally reviewed the radiological images as listed and agreed with the findings in the report. # 09/07/2017 CT chest abdomen pelvis w contrast Stable exam of the chest. No new lesion or progressive interval finding.  Post radiation changes. No metastatic disease.  Renal cysts, aortic atherosclerosis.  # 09/07/2017 Bone scan: no metastatic disease. Degenerative disease.  10/20/2017 Chest CXR  # PowerPort catheter noted with tip coiled in the superior vena cava, similar findings noted on prior exam. Persistent left upper lung infiltrate consistent with radiation changes. Similar findings noted on prior exams. # CT chest on 11/24/2017 Similar findings of radiation-induced consolidation in the medial left upper lobe.  No findings of recurrent or metastatic disease.  Left infrahilar node is similar and upper normal size. Looping of right side Port-A-Cath within the right internal jugular vein.  Aortic atherosclerosis.  Emphysema, coronary artery atherosclerosis. Esophageal air-fluid level suggest this mobility or gastroesophageal reflux  ASSESSMENT/PLAN Cancer Staging Squamous cell lung cancer, left ([place]) Staging form: Lung, AJCC 8th Edition - Clinical stage from 09/27/2016: Stage IIIA (cT2a, cN2, cM0) - Signed by Earlie Server, MD on 09/29/2016  1. Encounter for antineoplastic immunotherapy   2. Squamous cell lung cancer, left ([place])   3.  Cough   4. Other fatigue   5. Blood-tinged sputum   #Squamous cell lung cancer, stable disease. Labs are reviewed and discussed with patient.  He tolerates vitamin D weekly so far.  No signs of immunotherapy toxicity. Plan total of 26 cycles of treatment.  He  will finish in January 2020.  Will have a repeat CT scan after he finish immunotherapy. Counts acceptable for today's durvalumab treatment. #Mild anemia, hemoglobin stable.  #Chronic cough, stable. #Blood-tinged sputum  Follow-up in 2 weeks prior to the assessment for next cycle of infancy. Total face to face encounter time for this patient visit was 25 min. >50% of the time was  spent in counseling and coordination of care.    Earlie Server, MD, PhD Hematology Oncology [facility] at [facility] Pager- [phone]

## 2018-01-19 ENCOUNTER — Other Ambulatory Visit: Payer: Self-pay

## 2018-01-19 ENCOUNTER — Inpatient Hospital Stay: Payer: Medicare HMO

## 2018-01-19 ENCOUNTER — Inpatient Hospital Stay: Payer: Medicare HMO | Attending: Oncology | Admitting: Oncology

## 2018-01-19 ENCOUNTER — Encounter: Payer: Self-pay | Admitting: Oncology

## 2018-01-19 ENCOUNTER — Inpatient Hospital Stay: Payer: Medicare HMO | Attending: Oncology

## 2018-01-19 VITALS — BP 122/71 | HR 69 | Temp 95.6°F | Wt 254.0 lb

## 2018-01-19 DIAGNOSIS — R5383 Other fatigue: Secondary | ICD-10-CM | POA: Diagnosis not present

## 2018-01-19 DIAGNOSIS — C3492 Malignant neoplasm of unspecified part of left bronchus or lung: Secondary | ICD-10-CM

## 2018-01-19 DIAGNOSIS — Z87891 Personal history of nicotine dependence: Secondary | ICD-10-CM | POA: Insufficient documentation

## 2018-01-19 DIAGNOSIS — Z5112 Encounter for antineoplastic immunotherapy: Secondary | ICD-10-CM | POA: Diagnosis present

## 2018-01-19 DIAGNOSIS — D649 Anemia, unspecified: Secondary | ICD-10-CM | POA: Diagnosis not present

## 2018-01-19 DIAGNOSIS — R05 Cough: Secondary | ICD-10-CM | POA: Diagnosis not present

## 2018-01-19 DIAGNOSIS — Z79899 Other long term (current) drug therapy: Secondary | ICD-10-CM | POA: Diagnosis not present

## 2018-01-19 DIAGNOSIS — R059 Cough, unspecified: Secondary | ICD-10-CM

## 2018-01-19 LAB — COMPREHENSIVE METABOLIC PANEL
ALBUMIN: 4 g/dL (ref 3.5–5.0)
ALK PHOS: 68 U/L (ref 38–126)
ALT: 15 U/L (ref 0–44)
ANION GAP: 5 (ref 5–15)
AST: 16 U/L (ref 15–41)
BILIRUBIN TOTAL: 1.1 mg/dL (ref 0.3–1.2)
BUN: 16 mg/dL (ref 8–23)
CALCIUM: 8.9 mg/dL (ref 8.9–10.3)
CO2: 24 mmol/L (ref 22–32)
Chloride: 109 mmol/L (ref 98–111)
Creatinine, Ser: 0.98 mg/dL (ref 0.61–1.24)
GFR calc non Af Amer: >60 mL/min (ref >60)
Glucose, Bld: 96 mg/dL (ref 70–99)
POTASSIUM: 4 mmol/L (ref 3.5–5.1)
SODIUM: 138 mmol/L (ref 135–145)
TOTAL PROTEIN: 7.8 g/dL (ref 6.5–8.1)

## 2018-01-19 LAB — CBC WITH DIFFERENTIAL/PLATELET
ABS IMMATURE GRANULOCYTES: 0.02 10*3/uL (ref 0.00–0.07)
BASOS ABS: 0 10*3/uL (ref 0.0–0.1)
BASOS PCT: 0 %
EOS ABS: 0.2 10*3/uL (ref 0.0–0.5)
Eosinophils Relative: 4 %
HCT: 40.5 % (ref 39.0–52.0)
Hemoglobin: 13 g/dL (ref 13.0–17.0)
IMMATURE GRANULOCYTES: 0 %
Lymphocytes Relative: 20 %
Lymphs Abs: 1 10*3/uL (ref 0.7–4.0)
MCH: 27.9 pg (ref 26.0–34.0)
MCHC: 32.1 g/dL (ref 30.0–36.0)
MCV: 86.9 fL (ref 80.0–100.0)
Monocytes Absolute: 0.4 10*3/uL (ref 0.1–1.0)
Monocytes Relative: 9 %
NEUTROS ABS: 3.3 10*3/uL (ref 1.7–7.7)
NEUTROS PCT: 67 %
PLATELETS: 158 10*3/uL (ref 150–400)
RBC: 4.66 MIL/uL (ref 4.22–5.81)
RDW: 13.3 % (ref 11.5–15.5)
WBC: 5.1 10*3/uL (ref 4.0–10.5)
nRBC: 0 % (ref 0.0–0.2)

## 2018-01-19 MED ORDER — HEPARIN SOD (PORK) LOCK FLUSH 100 UNIT/ML IV SOLN
500.0000 [IU] | Freq: Once | INTRAVENOUS | Status: AC
  Administered 2018-01-19: 500 [IU] via INTRAVENOUS
  Filled 2018-01-19: qty 5

## 2018-01-19 MED ORDER — SODIUM CHLORIDE 0.9 % IV SOLN
10.0000 mg/kg | Freq: Once | INTRAVENOUS | Status: AC
  Administered 2018-01-19: 1120 mg via INTRAVENOUS
  Filled 2018-01-19: qty 2.4

## 2018-01-19 MED ORDER — OMEPRAZOLE 20 MG PO CPDR: 20.0000 mg | DELAYED_RELEASE_CAPSULE | Freq: Every day | ORAL | 5 refills | Status: DC

## 2018-01-19 MED ORDER — SODIUM CHLORIDE 0.9% FLUSH
10.0000 mL | INTRAVENOUS | Status: DC | PRN
  Administered 2018-01-19: 10 mL via INTRAVENOUS
  Filled 2018-01-19: qty 10

## 2018-01-19 MED ORDER — SODIUM CHLORIDE 0.9 % IV SOLN
Freq: Once | INTRAVENOUS | Status: AC
  Administered 2018-01-19: 09:00:00 via INTRAVENOUS
  Filled 2018-01-19: qty 250

## 2018-01-19 NOTE — Progress Notes (Signed)
[facility] Follow Up Visit  Patient Care Team: Glean Hess, MD as PCP - General (Internal Medicine) Earlie Server, MD as Consulting Physician (Oncology)  REASON FOR VISIT:  Follow up for evaluation prior to maintenance immunotherapy  HISTORY OF PRESENT ILLNESS/ PERTINENT ONCOLOGY HISTORY #  Stage IIIA lung squamous cell cancer. S/p concurrent chemotherapy and Radiation.[ completed Oct 2018], started on Durvalumab in Nov 2019.  06/09/17 CT chest. scan was presented on tumor board on 06/22/2017, consensus is stable disease, with postradiation changes. Monitor # Pathology: TPS 50%. 09/22/2016 left upper lobe biopsy showed squamous cell carcinoma. Foundation one test: ordered prior to patient first visit. Showed NO EGFR, ROS1, ALK, BRAF, MET mutation.   Cancer Treatment: S/p concurrent carbo/taxol and Radiation.  Currently Durvalumab Q2 weeks. Treatment was interrupted after cycle 5 due to radiation pneumonitis. After steroid treatment, back on Durvalumab Q2weeks.   INTERVAL HISTORY Today [name] presented for follow up for assessment prior to immunotherapy maintenance for treatment of lung squamous cell carcinoma stage IIIA  Patient has been doing well on immunotherapy.  Tolerates well.  Denies any nausea vomiting skin rash or diarrhea. No additional episodes of hemoptysis. Cough is chronic and pretty much at baseline. Fatigue is baseline.  Continue work as a Administrator.     Review of Systems  Constitutional: Positive for fatigue. Negative for appetite change, chills, fever and unexpected weight change.  HENT:   Negative for hearing loss and voice change.   Eyes: Negative for eye problems and icterus.  Respiratory: Positive for cough. Negative for chest tightness and shortness of breath.   Cardiovascular: Negative for chest pain and leg swelling.  Gastrointestinal: Negative for abdominal distention and abdominal pain.  Endocrine: Negative for hot  flashes.  Genitourinary: Negative for difficulty urinating, dysuria and frequency.   Musculoskeletal: Negative for arthralgias.  Skin: Negative for itching and rash.  Neurological: Negative for light-headedness and numbness.  Hematological: Negative for adenopathy. Does not bruise/bleed easily.  Psychiatric/Behavioral: Negative for confusion.    MEDICAL HISTORY: Past Medical History:  Diagnosis Date  . Enlarged prostate   . Squamous cell lung cancer, left ([place]) 08/2016    SURGICAL HISTORY: Past Surgical History:  Procedure Laterality Date  . NO PAST SURGERIES    . PORTACATH PLACEMENT Right 10/05/2016   Procedure: INSERTION PORT-A-CATH;  Surgeon: [name], MD;  Location: [facility] ORS;  Service: General;  Laterality: Right;    SOCIAL HISTORY: Social History   Socioeconomic History  . Marital status: Single    Spouse name: Not on file  . Number of children: Not on file  . Years of education: Not on file  . Highest education level: Not on file  Occupational History  . Not on file  Social Needs  . Financial resource strain: Not on file  . Food insecurity:    Worry: Not on file    Inability: Not on file  . Transportation needs:    Medical: Not on file    Non-medical: Not on file  Tobacco Use  . Smoking status: Former Smoker    Packs/day: 1.00    Years: 15.00    Pack years: 15.00    Types: Cigarettes    Last attempt to quit: 09/28/1992    Years since quitting: 25.3  . Smokeless tobacco: Never Used  Substance and Sexual Activity  . Alcohol use: No  . Drug use: No  . Sexual activity: Not on file  Lifestyle  . Physical activity:  Days per week: Not on file    Minutes per session: Not on file  . Stress: Not on file  Relationships  . Social connections:    Talks on phone: Not on file    Gets together: Not on file    Attends religious service: Not on file    Active member of club or organization: Not on file    Attends meetings of clubs or organizations: Not  on file    Relationship status: Not on file  . Intimate partner violence:    Fear of current or ex partner: Not on file    Emotionally abused: Not on file    Physically abused: Not on file    Forced sexual activity: Not on file  Other Topics Concern  . Not on file  Social History Narrative  . Not on file    FAMILY HISTORY Family History  Problem Relation Age of Onset  . Lymphoma Mother   . Diabetes Mother   . Lung cancer Sister   . Lung cancer Brother   . Lung cancer Brother     ALLERGIES:  has No Known Allergies.  MEDICATIONS:  Current Outpatient Medications  Medication Sig Dispense Refill  . acetaminophen (TYLENOL) 500 MG tablet Take 1,000 mg by mouth every 4 (four) hours as needed.     Marland Kitchen albuterol (PROVENTIL HFA;VENTOLIN HFA) 108 (90 Base) MCG/ACT inhaler Inhale 2 puffs into the lungs every 6 (six) hours as needed for wheezing or shortness of breath. 1 Inhaler 2  . benzonatate (TESSALON PERLES) 100 MG capsule Take 1 capsule (100 mg total) by mouth 3 (three) times daily as needed for cough. 30 capsule 0  . Cholecalciferol (VITAMIN D3) 2000 units capsule Take 1 capsule (2,000 Units total) by mouth daily.    . cyanocobalamin 500 MCG tablet Take 500 mcg by mouth daily.    Marland Kitchen Dextromethorphan-guaiFENesin 10-100 MG/5ML liquid Take 5 mLs by mouth every 12 (twelve) hours. 473 mL 1  . fluticasone (FLONASE) 50 MCG/ACT nasal spray Place 2 sprays into both nostrils daily. (Patient taking differently: Place 2 sprays into both nostrils daily as needed. ) 16 g 6  . fluticasone (FLOVENT HFA) 44 MCG/ACT inhaler Inhale 2 puffs into the lungs 2 (two) times daily. (Patient taking differently: Inhale 2 puffs into the lungs 2 (two) times daily as needed. ) 1 Inhaler 12  . lidocaine-prilocaine (EMLA) cream Apply 1 application topically as needed. 30 g 3  . mupirocin ointment (BACTROBAN) 2 % Apply 1 application topically 2 (two) times daily as needed. 22 g 2  . omeprazole (PRILOSEC) 20 MG capsule  Take 1 capsule (20 mg total) by mouth daily. 30 capsule 5  . terazosin (HYTRIN) 1 MG capsule Take 1 capsule (1 mg total) by mouth at bedtime.     No current facility-administered medications for this visit.     PHYSICAL EXAMINATION:  ECOG PERFORMANCE STATUS: 0 - Asymptomatic   Vitals:   01/19/18 0838  BP: 122/71  Pulse: 69  Temp: (!) 95.6 F (35.3 C)   Filed Weights   01/19/18 0838  Weight: 254 lb (115.2 kg)  Physical Exam  Constitutional: He is oriented to person, place, and time. No distress.  HENT:  Head: Normocephalic and atraumatic.  Nose: Nose normal.  Mouth/Throat: Oropharynx is clear and moist. No oropharyngeal exudate.  Eyes: Pupils are equal, round, and reactive to light. Conjunctivae and EOM are normal. No scleral icterus.  Neck: Normal range of motion. Neck supple.  Cardiovascular: Normal  rate, regular rhythm and normal heart sounds. Exam reveals no friction rub.  No murmur heard. Pulmonary/Chest: Effort normal. No respiratory distress. He has no wheezes. He has no rales. He exhibits no tenderness.    Decreased breath sounds bilaterally.  Abdominal: Soft. He exhibits no distension. There is no tenderness.  Musculoskeletal: Normal range of motion. He exhibits no edema, tenderness or deformity.  Neurological: He is alert and oriented to person, place, and time. No cranial nerve deficit. He exhibits normal muscle tone. Coordination normal.  Skin: Skin is warm and dry. He is not diaphoretic. No erythema.  Psychiatric: Mood, memory, affect and judgment normal.      LABORATORY DATA: I have personally reviewed the data as listed: CBC    Component Value Date/Time   WBC 5.1 01/19/2018 0819   RBC 4.66 01/19/2018 0819   HGB 13.0 01/19/2018 0819   HCT 40.5 01/19/2018 0819   PLT 158 01/19/2018 0819   MCV 86.9 01/19/2018 0819   MCH 27.9 01/19/2018 0819   MCHC 32.1 01/19/2018 0819   RDW 13.3 01/19/2018 0819   LYMPHSABS 1.0 01/19/2018 0819   MONOABS 0.4 01/19/2018  0819   EOSABS 0.2 01/19/2018 0819   BASOSABS 0.0 01/19/2018 0819    CMP Latest Ref Rng & Units 01/19/2018 01/05/2018 12/15/2017  Glucose 70 - 99 mg/dL 96 124(H) 110(H)  BUN 8 - 23 mg/dL '16 18 16  ' Creatinine 0.61 - 1.24 mg/dL 0.98 1.10 1.04  Sodium 135 - 145 mmol/L 138 139 138  Potassium 3.5 - 5.1 mmol/L 4.0 3.8 3.9  Chloride 98 - 111 mmol/L 109 109 109  CO2 22 - 32 mmol/L '24 25 23  ' Calcium 8.9 - 10.3 mg/dL 8.9 9.0 8.9  Total Protein 6.5 - 8.1 g/dL 7.8 7.4 7.2  Total Bilirubin 0.3 - 1.2 mg/dL 1.1 0.8 0.7  Alkaline Phos 38 - 126 U/L 68 67 60  AST 15 - 41 U/L '16 19 20  ' ALT 0 - 44 U/L '15 16 21   ' RADIOGRAPHIC STUDIES: I have personally reviewed the radiological images as listed and agreed with the findings in the report. # 09/07/2017 CT chest abdomen pelvis w contrast Stable exam of the chest. No new lesion or progressive interval finding.  Post radiation changes. No metastatic disease.  Renal cysts, aortic atherosclerosis.  # 09/07/2017 Bone scan: no metastatic disease. Degenerative disease.  10/20/2017 Chest CXR  # PowerPort catheter noted with tip coiled in the superior vena cava, similar findings noted on prior exam. Persistent left upper lung infiltrate consistent with radiation changes. Similar findings noted on prior exams. # CT chest on 11/24/2017 Similar findings of radiation-induced consolidation in the medial left upper lobe.  No findings of recurrent or metastatic disease.  Left infrahilar node is similar and upper normal size. Looping of right side Port-A-Cath within the right internal jugular vein.  Aortic atherosclerosis.  Emphysema, coronary artery atherosclerosis. Esophageal air-fluid level suggest this mobility or gastroesophageal reflux  ASSESSMENT/PLAN Cancer Staging Squamous cell lung cancer, left ([place]) Staging form: Lung, AJCC 8th Edition - Clinical stage from 09/27/2016: Stage IIIA (cT2a, cN2, cM0) - Signed by Earlie Server, MD on 09/29/2016  1. Squamous cell lung cancer,  left ([place])   2. Encounter for antineoplastic immunotherapy   3. Cough   #Squamous cell lung cancer, stable disease. Labs are reviewed and discussed with patient.  He tolerates immunotherapy very well so far.  No signs of immunotherapy toxicity. Plan total of 26 cycles of treatment.  He will be finishing  treatments in January 2020.  Will repeat CT scan after he finish immunotherapy. Counts acceptable to proceed with today's durvalumab treatment. Mild anemia hemoglobin stable  chronic cough stable.  Follow-up in 2 weeks prior to the assessment for next cycle of infancy. Total face to face encounter time for this patient visit was 25 min. >50% of the time was  spent in counseling and coordination of care.   Earlie Server, MD, PhD Hematology Oncology [facility] at [facility] Pager- [phone] 01/19/18

## 2018-01-19 NOTE — Progress Notes (Signed)
Patient here today for follow up and chemo infusion.  Patient states no new concerns today

## 2018-02-01 ENCOUNTER — Telehealth: Payer: Self-pay | Admitting: Internal Medicine

## 2018-02-01 NOTE — Telephone Encounter (Signed)
Tried to call Patient.  Not at home at the time of call, per friend that answered phone. I told the lady I will call back.  Need to schedule AWV-I. lec

## 2018-02-02 ENCOUNTER — Encounter: Payer: Self-pay | Admitting: Oncology

## 2018-02-02 ENCOUNTER — Inpatient Hospital Stay: Payer: Medicare HMO

## 2018-02-02 ENCOUNTER — Other Ambulatory Visit: Payer: Self-pay

## 2018-02-02 ENCOUNTER — Inpatient Hospital Stay (HOSPITAL_BASED_OUTPATIENT_CLINIC_OR_DEPARTMENT_OTHER): Payer: Medicare HMO | Admitting: Oncology

## 2018-02-02 VITALS — BP 144/85 | HR 62 | Temp 96.6°F | Wt 254.2 lb

## 2018-02-02 DIAGNOSIS — Z5112 Encounter for antineoplastic immunotherapy: Secondary | ICD-10-CM

## 2018-02-02 DIAGNOSIS — C3492 Malignant neoplasm of unspecified part of left bronchus or lung: Secondary | ICD-10-CM

## 2018-02-02 DIAGNOSIS — R5383 Other fatigue: Secondary | ICD-10-CM

## 2018-02-02 DIAGNOSIS — Z79899 Other long term (current) drug therapy: Secondary | ICD-10-CM

## 2018-02-02 DIAGNOSIS — Z87891 Personal history of nicotine dependence: Secondary | ICD-10-CM

## 2018-02-02 DIAGNOSIS — R05 Cough: Secondary | ICD-10-CM

## 2018-02-02 DIAGNOSIS — D649 Anemia, unspecified: Secondary | ICD-10-CM

## 2018-02-02 LAB — CBC WITH DIFFERENTIAL/PLATELET
Abs Immature Granulocytes: 0.01 10*3/uL (ref 0.00–0.07)
Basophils Absolute: 0 10*3/uL (ref 0.0–0.1)
Basophils Relative: 0 %
Eosinophils Absolute: 0.2 10*3/uL (ref 0.0–0.5)
Eosinophils Relative: 4 %
HEMATOCRIT: 40.2 % (ref 39.0–52.0)
Hemoglobin: 12.6 g/dL — ABNORMAL LOW (ref 13.0–17.0)
Immature Granulocytes: 0 %
Lymphocytes Relative: 23 %
Lymphs Abs: 1.1 10*3/uL (ref 0.7–4.0)
MCH: 27.3 pg (ref 26.0–34.0)
MCHC: 31.3 g/dL (ref 30.0–36.0)
MCV: 87.2 fL (ref 80.0–100.0)
Monocytes Absolute: 0.4 10*3/uL (ref 0.1–1.0)
Monocytes Relative: 9 %
Neutro Abs: 3.1 10*3/uL (ref 1.7–7.7)
Neutrophils Relative %: 64 %
Platelets: 159 10*3/uL (ref 150–400)
RBC: 4.61 MIL/uL (ref 4.22–5.81)
RDW: 13.6 % (ref 11.5–15.5)
WBC: 4.8 10*3/uL (ref 4.0–10.5)
nRBC: 0 % (ref 0.0–0.2)

## 2018-02-02 LAB — COMPREHENSIVE METABOLIC PANEL WITH GFR
ALT: 18 U/L (ref 0–44)
AST: 18 U/L (ref 15–41)
Albumin: 4 g/dL (ref 3.5–5.0)
Alkaline Phosphatase: 66 U/L (ref 38–126)
Anion gap: 5 (ref 5–15)
BUN: 21 mg/dL (ref 8–23)
CO2: 24 mmol/L (ref 22–32)
Calcium: 9 mg/dL (ref 8.9–10.3)
Chloride: 110 mmol/L (ref 98–111)
Creatinine, Ser: 1.04 mg/dL (ref 0.61–1.24)
GFR calc Af Amer: >60 mL/min
GFR calc non Af Amer: >60 mL/min
Glucose, Bld: 93 mg/dL (ref 70–99)
Potassium: 4.3 mmol/L (ref 3.5–5.1)
Sodium: 139 mmol/L (ref 135–145)
Total Bilirubin: 0.6 mg/dL (ref 0.3–1.2)
Total Protein: 7.3 g/dL (ref 6.5–8.1)

## 2018-02-02 MED ORDER — SODIUM CHLORIDE 0.9% FLUSH
10.0000 mL | INTRAVENOUS | Status: DC | PRN
  Filled 2018-02-02: qty 10

## 2018-02-02 MED ORDER — HEPARIN SOD (PORK) LOCK FLUSH 100 UNIT/ML IV SOLN
500.0000 [IU] | Freq: Once | INTRAVENOUS | Status: AC | PRN
  Administered 2018-02-02: 500 [IU]
  Filled 2018-02-02: qty 5

## 2018-02-02 MED ORDER — SODIUM CHLORIDE 0.9 % IV SOLN
Freq: Once | INTRAVENOUS | Status: AC
  Administered 2018-02-02: 09:00:00 via INTRAVENOUS
  Filled 2018-02-02: qty 250

## 2018-02-02 MED ORDER — SODIUM CHLORIDE 0.9 % IV SOLN
10.0000 mg/kg | Freq: Once | INTRAVENOUS | Status: AC
  Administered 2018-02-02: 1120 mg via INTRAVENOUS
  Filled 2018-02-02: qty 20

## 2018-02-02 NOTE — Progress Notes (Signed)
Patient here for follow up. No concerns voiced.  °

## 2018-02-02 NOTE — Progress Notes (Signed)
[name]  Patient Care Team: Ryan Hess, MD as PCP - General (Internal Medicine) Ryan Server, MD as Consulting Physician (Oncology)  REASON FOR [name]:  Follow up for evaluation prior to maintenance immunotherapy  HISTORY OF PRESENT ILLNESS/ PERTINENT ONCOLOGY HISTORY #  Stage IIIA lung squamous cell cancer. S/p concurrent chemotherapy and Radiation.[ completed Oct 2018], started on Durvalumab in Nov 2019.  06/09/17 CT chest. scan was presented on tumor board on 06/22/2017, consensus is stable disease, with postradiation changes. Monitor # Pathology: TPS 50%. 09/22/2016 left upper lobe biopsy showed squamous cell carcinoma. Foundation one test: ordered prior to patient first [name]. Showed NO EGFR, ROS1, ALK, BRAF, MET mutation.   Cancer Treatment: S/p concurrent carbo/taxol and Radiation. Finished Oct 2018. Currently Durvalumab Q2 weeks. Treatment was interrupted after cycle 5 due to radiation pneumonitis. After steroid treatment, back on Durvalumab Q2weeks.   INTERVAL HISTORY Today [name] presented for follow up for assessment prior to immunotherapy maintenance for treatment of lung squamous cell carcinoma stage IIIA  Reports doing well at baseline.  Tolerates immunotherapy well.  Denies any nausea, vomiting, skin rash or diarrhea. Denies any hemoptysis.  Cough is chronic and reports feeling better.  Fatigue is at baseline.  Continue work as a Administrator .   Review of Systems  Constitutional: Positive for fatigue. Negative for appetite change, chills, fever and unexpected weight change.  HENT:   Negative for hearing loss and voice change.   Eyes: Negative for eye problems and icterus.  Respiratory: Positive for cough. Negative for chest tightness and shortness of breath.   Cardiovascular: Negative for chest pain and leg swelling.  Gastrointestinal: Negative for abdominal distention and abdominal pain.  Endocrine: Negative for hot  flashes.  Genitourinary: Negative for difficulty urinating, dysuria and frequency.   Musculoskeletal: Negative for arthralgias.  Skin: Negative for itching and rash.  Neurological: Negative for light-headedness and numbness.  Hematological: Negative for adenopathy. Does not bruise/bleed easily.  Psychiatric/Behavioral: Negative for confusion.    MEDICAL HISTORY: Past Medical History:  Diagnosis Date  . Enlarged prostate   . Squamous cell lung cancer, left ([place]) 08/2016    SURGICAL HISTORY: Past Surgical History:  Procedure Laterality Date  . NO PAST SURGERIES    . PORTACATH PLACEMENT Right 10/05/2016   Procedure: INSERTION PORT-A-CATH;  Surgeon: [name], MD;  Location: [facility] ORS;  Service: General;  Laterality: Right;    SOCIAL HISTORY: Social History   Socioeconomic History  . Marital status: Single    Spouse name: Not on file  . Number of children: Not on file  . Years of education: Not on file  . Highest education level: Not on file  Occupational History  . Not on file  Social Needs  . Financial resource strain: Not on file  . Food insecurity:    Worry: Not on file    Inability: Not on file  . Transportation needs:    Medical: Not on file    Non-medical: Not on file  Tobacco Use  . Smoking status: Former Smoker    Packs/day: 1.00    Years: 15.00    Pack years: 15.00    Types: Cigarettes    Last attempt to quit: 09/28/1992    Years since quitting: 25.3  . Smokeless tobacco: Never Used  Substance and Sexual Activity  . Alcohol use: No  . Drug use: No  . Sexual activity: Not on file  Lifestyle  . Physical activity:  Days per week: Not on file    Minutes per session: Not on file  . Stress: Not on file  Relationships  . Social connections:    Talks on phone: Not on file    Gets together: Not on file    Attends religious service: Not on file    Active member of club or organization: Not on file    Attends meetings of clubs or organizations: Not  on file    Relationship status: Not on file  . Intimate partner violence:    Fear of current or ex partner: Not on file    Emotionally abused: Not on file    Physically abused: Not on file    Forced sexual activity: Not on file  Other Topics Concern  . Not on file  Social History Narrative  . Not on file    FAMILY HISTORY Family History  Problem Relation Age of Onset  . Lymphoma Mother   . Diabetes Mother   . Lung cancer Sister   . Lung cancer Brother   . Lung cancer Brother     ALLERGIES:  has No Known Allergies.  MEDICATIONS:  Current Outpatient Medications  Medication Sig Dispense Refill  . acetaminophen (TYLENOL) 500 MG tablet Take 1,000 mg by mouth every 4 (four) hours as needed.     Marland Kitchen albuterol (PROVENTIL HFA;VENTOLIN HFA) 108 (90 Base) MCG/ACT inhaler Inhale 2 puffs into the lungs every 6 (six) hours as needed for wheezing or shortness of breath. 1 Inhaler 2  . benzonatate (TESSALON PERLES) 100 MG capsule Take 1 capsule (100 mg total) by mouth 3 (three) times daily as needed for cough. 30 capsule 0  . Cholecalciferol (VITAMIN D3) 2000 units capsule Take 1 capsule (2,000 Units total) by mouth daily.    . cyanocobalamin 500 MCG tablet Take 500 mcg by mouth daily.    Marland Kitchen Dextromethorphan-guaiFENesin 10-100 MG/5ML liquid Take 5 mLs by mouth every 12 (twelve) hours. 473 mL 1  . fluticasone (FLONASE) 50 MCG/ACT nasal spray Place 2 sprays into both nostrils daily. (Patient taking differently: Place 2 sprays into both nostrils daily as needed. ) 16 g 6  . fluticasone (FLOVENT HFA) 44 MCG/ACT inhaler Inhale 2 puffs into the lungs 2 (two) times daily. (Patient taking differently: Inhale 2 puffs into the lungs 2 (two) times daily as needed. ) 1 Inhaler 12  . lidocaine-prilocaine (EMLA) cream Apply 1 application topically as needed. 30 g 3  . mupirocin ointment (BACTROBAN) 2 % Apply 1 application topically 2 (two) times daily as needed. 22 g 2  . omeprazole (PRILOSEC) 20 MG capsule  Take 1 capsule (20 mg total) by mouth daily. 30 capsule 5  . terazosin (HYTRIN) 1 MG capsule Take 1 capsule (1 mg total) by mouth at bedtime.     No current facility-administered medications for this [name].     PHYSICAL EXAMINATION:  ECOG PERFORMANCE STATUS: 0 - Asymptomatic   Vitals:   02/02/18 0833  BP: (!) 144/85  Pulse: 62  Temp: (!) 96.6 F (35.9 C)  SpO2: 97%   Filed Weights   02/02/18 0833  Weight: 254 lb 3.2 oz (115.3 kg)  Physical Exam  Constitutional: He is oriented to person, place, and time. No distress.  HENT:  Head: Normocephalic and atraumatic.  Nose: Nose normal.  Mouth/Throat: Oropharynx is clear and moist. No oropharyngeal exudate.  Eyes: Pupils are equal, round, and reactive to light. Conjunctivae and EOM are normal. No scleral icterus.  Neck: Normal range of  motion. Neck supple.  Cardiovascular: Normal rate, regular rhythm and normal heart sounds. Exam reveals no friction rub.  No murmur heard. Pulmonary/Chest: Effort normal. No respiratory distress. He has no wheezes. He has no rales. He exhibits no tenderness.    Decreased breath sounds bilaterally.  Abdominal: Soft. He exhibits no distension. There is no abdominal tenderness.  Musculoskeletal: Normal range of motion.        General: No tenderness, deformity or edema.  Neurological: He is alert and oriented to person, place, and time. No cranial nerve deficit. He exhibits normal muscle tone. Coordination normal.  Skin: Skin is warm and dry. He is not diaphoretic. No erythema.  Psychiatric: Mood, memory, affect and judgment normal.      LABORATORY DATA: I have personally reviewed the data as listed: CBC    Component Value Date/Time   WBC 4.8 02/02/2018 0808   RBC 4.61 02/02/2018 0808   HGB 12.6 (L) 02/02/2018 0808   HCT 40.2 02/02/2018 0808   PLT 159 02/02/2018 0808   MCV 87.2 02/02/2018 0808   MCH 27.3 02/02/2018 0808   MCHC 31.3 02/02/2018 0808   RDW 13.6 02/02/2018 0808   LYMPHSABS 1.1  02/02/2018 0808   MONOABS 0.4 02/02/2018 0808   EOSABS 0.2 02/02/2018 0808   BASOSABS 0.0 02/02/2018 0808    CMP Latest Ref Rng & Units 02/02/2018 01/19/2018 01/05/2018  Glucose 70 - 99 mg/dL 93 96 124(H)  BUN 8 - 23 mg/dL _0 Creatinine 0.61 - 1.24 mg/dL 1.04 0.98 1.10  Sodium 135 - 145 mmol/L 139 138 139  Potassium 3.5 - 5.1 mmol/L 4.3 4.0 3.8  Chloride 98 - 111 mmol/L 110 109 109  CO2 22 - 32 mmol/L _1 Calcium 8.9 - 10.3 mg/dL 9.0 8.9 9.0  Total Protein 6.5 - 8.1 g/dL 7.3 7.8 7.4  Total Bilirubin 0.3 - 1.2 mg/dL 0.6 1.1 0.8  Alkaline Phos 38 - 126 U/L 66 68 67  AST 15 - 41 U/L _2 ALT 0 - 44 U/L _3 RADIOGRAPHIC STUDIES: I have personally reviewed the radiological images as listed and agreed with the findings in the report. # 09/07/2017 CT chest abdomen pelvis w contrast Stable exam of the chest. No new lesion or progressive interval finding.  Post radiation changes. No metastatic disease.  Renal cysts, aortic atherosclerosis.  # 09/07/2017 Bone scan: no metastatic disease. Degenerative disease.  10/20/2017 Chest CXR  # PowerPort catheter noted with tip coiled in the superior vena cava, similar findings noted on prior exam. Persistent left upper lung infiltrate consistent with radiation changes. Similar findings noted on prior exams. # CT chest on 11/24/2017 Similar findings of radiation-induced consolidation in the medial left upper lobe.  No findings of recurrent or metastatic disease.  Left infrahilar node is similar and upper normal size. Looping of right side Port-A-Cath within the right internal jugular vein.  Aortic atherosclerosis.  Emphysema, coronary artery atherosclerosis. Esophageal air-fluid level suggest this mobility or gastroesophageal reflux  ASSESSMENT/PLAN Cancer Staging Squamous cell lung cancer, left ([place]) Staging form: Lung, AJCC 8th Edition - Clinical stage from 09/27/2016: Stage IIIA (cT2a, cN2, cM0) - Signed by Ryan Server, MD on  09/29/2016  1. Squamous cell lung cancer, left ([place])   2. Encounter for antineoplastic immunotherapy   3. Anemia, unspecified type   #Squamous cell lung cancer, stable disease. Labs reviewed and discussed with patient.  He tolerates immunotherapy very well so far.  No clinical  signs of immunotherapy related toxicity.  Plan complete total of 26 cycle of treatments.  He will be finishing in January 2020.  Repeat CT scan after finishing immunotherapy.,  Counts are acceptable to proceed with today's durvalumab treatment. chronic cough stable. Mild anemia stable.  Continue to monitor.  Follow-up in 2 weeks prior to the assessment for next cycle of infancy. Total face to face encounter time for this patient [name] was 25 min. >50% of the time was  spent in counseling and coordination of care.   Ryan Server, MD, PhD Hematology Oncology [facility] at [facility] Pager- [phone] 02/02/18

## 2018-02-09 NOTE — Telephone Encounter (Signed)
02/02/18 at 12:23 PM, scheduled AWV for 02/28/18 at 3:20 PM. lec

## 2018-02-16 ENCOUNTER — Inpatient Hospital Stay: Payer: Medicare HMO | Attending: Oncology

## 2018-02-16 ENCOUNTER — Inpatient Hospital Stay: Payer: Medicare HMO

## 2018-02-16 ENCOUNTER — Inpatient Hospital Stay (HOSPITAL_BASED_OUTPATIENT_CLINIC_OR_DEPARTMENT_OTHER): Payer: Medicare HMO | Admitting: Oncology

## 2018-02-16 ENCOUNTER — Encounter: Payer: Self-pay | Admitting: Oncology

## 2018-02-16 ENCOUNTER — Other Ambulatory Visit: Payer: Self-pay

## 2018-02-16 VITALS — BP 137/84 | HR 69 | Temp 96.0°F | Resp 18 | Wt 257.8 lb

## 2018-02-16 DIAGNOSIS — K219 Gastro-esophageal reflux disease without esophagitis: Secondary | ICD-10-CM | POA: Insufficient documentation

## 2018-02-16 DIAGNOSIS — I2510 Atherosclerotic heart disease of native coronary artery without angina pectoris: Secondary | ICD-10-CM | POA: Diagnosis not present

## 2018-02-16 DIAGNOSIS — R05 Cough: Secondary | ICD-10-CM | POA: Diagnosis not present

## 2018-02-16 DIAGNOSIS — D649 Anemia, unspecified: Secondary | ICD-10-CM

## 2018-02-16 DIAGNOSIS — Z5112 Encounter for antineoplastic immunotherapy: Secondary | ICD-10-CM | POA: Diagnosis present

## 2018-02-16 DIAGNOSIS — C3492 Malignant neoplasm of unspecified part of left bronchus or lung: Secondary | ICD-10-CM | POA: Diagnosis not present

## 2018-02-16 DIAGNOSIS — Z87891 Personal history of nicotine dependence: Secondary | ICD-10-CM | POA: Insufficient documentation

## 2018-02-16 DIAGNOSIS — Z801 Family history of malignant neoplasm of trachea, bronchus and lung: Secondary | ICD-10-CM | POA: Insufficient documentation

## 2018-02-16 DIAGNOSIS — Z79899 Other long term (current) drug therapy: Secondary | ICD-10-CM

## 2018-02-16 DIAGNOSIS — Z923 Personal history of irradiation: Secondary | ICD-10-CM | POA: Diagnosis not present

## 2018-02-16 DIAGNOSIS — Z9221 Personal history of antineoplastic chemotherapy: Secondary | ICD-10-CM | POA: Diagnosis not present

## 2018-02-16 DIAGNOSIS — I700 Atherosclerosis of aorta: Secondary | ICD-10-CM | POA: Insufficient documentation

## 2018-02-16 DIAGNOSIS — Z95828 Presence of other vascular implants and grafts: Secondary | ICD-10-CM

## 2018-02-16 LAB — CBC WITH DIFFERENTIAL/PLATELET
ABS IMMATURE GRANULOCYTES: 0.03 10*3/uL (ref 0.00–0.07)
BASOS PCT: 1 %
Basophils Absolute: 0 10*3/uL (ref 0.0–0.1)
Eosinophils Absolute: 0.2 10*3/uL (ref 0.0–0.5)
Eosinophils Relative: 5 %
HCT: 40.5 % (ref 39.0–52.0)
Hemoglobin: 12.8 g/dL — ABNORMAL LOW (ref 13.0–17.0)
Immature Granulocytes: 1 %
Lymphocytes Relative: 24 %
Lymphs Abs: 1 10*3/uL (ref 0.7–4.0)
MCH: 27.6 pg (ref 26.0–34.0)
MCHC: 31.6 g/dL (ref 30.0–36.0)
MCV: 87.3 fL (ref 80.0–100.0)
MONOS PCT: 8 %
Monocytes Absolute: 0.3 10*3/uL (ref 0.1–1.0)
Neutro Abs: 2.5 10*3/uL (ref 1.7–7.7)
Neutrophils Relative %: 61 %
Platelets: 164 10*3/uL (ref 150–400)
RBC: 4.64 MIL/uL (ref 4.22–5.81)
RDW: 13.4 % (ref 11.5–15.5)
WBC: 4.1 10*3/uL (ref 4.0–10.5)
nRBC: 0 % (ref 0.0–0.2)

## 2018-02-16 LAB — COMPREHENSIVE METABOLIC PANEL
ALT: 19 U/L (ref 0–44)
AST: 19 U/L (ref 15–41)
Albumin: 3.8 g/dL (ref 3.5–5.0)
Alkaline Phosphatase: 66 U/L (ref 38–126)
Anion gap: 6 (ref 5–15)
BUN: 13 mg/dL (ref 8–23)
CHLORIDE: 111 mmol/L (ref 98–111)
CO2: 23 mmol/L (ref 22–32)
CREATININE: 1.06 mg/dL (ref 0.61–1.24)
Calcium: 8.8 mg/dL — ABNORMAL LOW (ref 8.9–10.3)
GFR calc Af Amer: >60 mL/min (ref >60)
GFR calc non Af Amer: >60 mL/min (ref >60)
Glucose, Bld: 94 mg/dL (ref 70–99)
Potassium: 3.8 mmol/L (ref 3.5–5.1)
Sodium: 140 mmol/L (ref 135–145)
Total Bilirubin: 0.7 mg/dL (ref 0.3–1.2)
Total Protein: 7.2 g/dL (ref 6.5–8.1)

## 2018-02-16 LAB — TSH: TSH: 2.041 u[IU]/mL (ref 0.350–4.500)

## 2018-02-16 MED ORDER — SODIUM CHLORIDE 0.9% FLUSH
10.0000 mL | INTRAVENOUS | Status: DC | PRN
  Administered 2018-02-16: 10 mL via INTRAVENOUS
  Filled 2018-02-16: qty 10

## 2018-02-16 MED ORDER — SODIUM CHLORIDE 0.9 % IV SOLN
10.0000 mg/kg | Freq: Once | INTRAVENOUS | Status: AC
  Administered 2018-02-16: 1120 mg via INTRAVENOUS
  Filled 2018-02-16: qty 22.4

## 2018-02-16 MED ORDER — SODIUM CHLORIDE 0.9 % IV SOLN
Freq: Once | INTRAVENOUS | Status: AC
  Administered 2018-02-16: 09:00:00 via INTRAVENOUS
  Filled 2018-02-16: qty 250

## 2018-02-16 MED ORDER — HEPARIN SOD (PORK) LOCK FLUSH 100 UNIT/ML IV SOLN
500.0000 [IU] | Freq: Once | INTRAVENOUS | Status: AC
  Administered 2018-02-16: 500 [IU] via INTRAVENOUS
  Filled 2018-02-16: qty 5

## 2018-02-16 NOTE — Progress Notes (Signed)
Patient here for follow up. No changes since last visit.   

## 2018-02-16 NOTE — Progress Notes (Signed)
[facility] Follow Up Visit  Patient Care Team: Glean Hess, MD as PCP - General (Internal Medicine) Earlie Server, MD as Consulting Physician (Oncology)  REASON FOR VISIT:  Follow up for evaluation prior to maintenance immunotherapy  HISTORY OF PRESENT ILLNESS/ PERTINENT ONCOLOGY HISTORY #  Stage IIIA lung squamous cell cancer. S/p concurrent chemotherapy and Radiation.[ completed Oct 2018], started on Durvalumab in Nov 2019.  06/09/17 CT chest. scan was presented on tumor board on 06/22/2017, consensus is stable disease, with postradiation changes. Monitor # Pathology: TPS 50%. 09/22/2016 left upper lobe biopsy showed squamous cell carcinoma. Foundation one test: ordered prior to patient first visit. Showed NO EGFR, ROS1, ALK, BRAF, MET mutation.   Cancer Treatment: S/p concurrent carbo/taxol and Radiation. Finished Oct 2018. Currently Durvalumab Q2 weeks. Treatment was interrupted after cycle 5 due to radiation pneumonitis. After steroid treatment, back on Durvalumab Q2weeks.   INTERVAL HISTORY Today [name] presented for follow up for assessment prior to immunotherapy maintenance for treatment of lung squamous cell carcinoma stage IIIA  #Continue to do well at baseline.  Tolerates immunotherapy very well. Denies any nausea vomiting, skin rash or diarrhea. Chronic fatigue is at baseline. Continues to work as a Administrator. Cough has getting better.  Denies any shortness of breath. Accompanied by daughter and wife..   Review of Systems  Constitutional: Positive for fatigue. Negative for appetite change, chills, diaphoresis, fever and unexpected weight change.  HENT:   Negative for hearing loss, lump/mass, nosebleeds, sore throat and voice change.   Eyes: Negative for eye problems and icterus.  Respiratory: Positive for cough. Negative for chest tightness, hemoptysis, shortness of breath and wheezing.   Cardiovascular: Negative for chest pain and leg  swelling.  Gastrointestinal: Negative for abdominal distention, abdominal pain, blood in stool, diarrhea, nausea and rectal pain.  Endocrine: Negative for hot flashes.  Genitourinary: Negative for bladder incontinence, difficulty urinating, dysuria, frequency, hematuria and nocturia.   Musculoskeletal: Negative for arthralgias, back pain, flank pain, gait problem and myalgias.  Skin: Negative for itching and rash.  Neurological: Negative for dizziness, gait problem, headaches, light-headedness, numbness and seizures.  Hematological: Negative for adenopathy. Does not bruise/bleed easily.  Psychiatric/Behavioral: Negative for confusion and decreased concentration. The patient is not nervous/anxious.     MEDICAL HISTORY: Past Medical History:  Diagnosis Date  . Enlarged prostate   . Squamous cell lung cancer, left ([place]) 08/2016    SURGICAL HISTORY: Past Surgical History:  Procedure Laterality Date  . NO PAST SURGERIES    . PORTACATH PLACEMENT Right 10/05/2016   Procedure: INSERTION PORT-A-CATH;  Surgeon: [name], MD;  Location: [facility] ORS;  Service: General;  Laterality: Right;    SOCIAL HISTORY: Social History   Socioeconomic History  . Marital status: Single    Spouse name: Not on file  . Number of children: Not on file  . Years of education: Not on file  . Highest education level: Not on file  Occupational History  . Not on file  Social Needs  . Financial resource strain: Not on file  . Food insecurity:    Worry: Not on file    Inability: Not on file  . Transportation needs:    Medical: Not on file    Non-medical: Not on file  Tobacco Use  . Smoking status: Former Smoker    Packs/day: 1.00    Years: 15.00    Pack years: 15.00    Types: Cigarettes    Last attempt to  quit: 09/28/1992    Years since quitting: 25.4  . Smokeless tobacco: Never Used  Substance and Sexual Activity  . Alcohol use: No  . Drug use: No  . Sexual activity: Not on file  Lifestyle  .  Physical activity:    Days per week: Not on file    Minutes per session: Not on file  . Stress: Not on file  Relationships  . Social connections:    Talks on phone: Not on file    Gets together: Not on file    Attends religious service: Not on file    Active member of club or organization: Not on file    Attends meetings of clubs or organizations: Not on file    Relationship status: Not on file  . Intimate partner violence:    Fear of current or ex partner: Not on file    Emotionally abused: Not on file    Physically abused: Not on file    Forced sexual activity: Not on file  Other Topics Concern  . Not on file  Social History Narrative  . Not on file    FAMILY HISTORY Family History  Problem Relation Age of Onset  . Lymphoma Mother   . Diabetes Mother   . Lung cancer Sister   . Lung cancer Brother   . Lung cancer Brother     ALLERGIES:  has No Known Allergies.  MEDICATIONS:  Current Outpatient Medications  Medication Sig Dispense Refill  . acetaminophen (TYLENOL) 500 MG tablet Take 1,000 mg by mouth every 4 (four) hours as needed.     Marland Kitchen albuterol (PROVENTIL HFA;VENTOLIN HFA) 108 (90 Base) MCG/ACT inhaler Inhale 2 puffs into the lungs every 6 (six) hours as needed for wheezing or shortness of breath. 1 Inhaler 2  . benzonatate (TESSALON PERLES) 100 MG capsule Take 1 capsule (100 mg total) by mouth 3 (three) times daily as needed for cough. 30 capsule 0  . Cholecalciferol (VITAMIN D3) 2000 units capsule Take 1 capsule (2,000 Units total) by mouth daily.    . cyanocobalamin 500 MCG tablet Take 500 mcg by mouth daily.    Marland Kitchen Dextromethorphan-guaiFENesin 10-100 MG/5ML liquid Take 5 mLs by mouth every 12 (twelve) hours. 473 mL 1  . fluticasone (FLONASE) 50 MCG/ACT nasal spray Place 2 sprays into both nostrils daily. (Patient taking differently: Place 2 sprays into both nostrils daily as needed. ) 16 g 6  . fluticasone (FLOVENT HFA) 44 MCG/ACT inhaler Inhale 2 puffs into the  lungs 2 (two) times daily. (Patient taking differently: Inhale 2 puffs into the lungs 2 (two) times daily as needed. ) 1 Inhaler 12  . lidocaine-prilocaine (EMLA) cream Apply 1 application topically as needed. 30 g 3  . mupirocin ointment (BACTROBAN) 2 % Apply 1 application topically 2 (two) times daily as needed. 22 g 2  . omeprazole (PRILOSEC) 20 MG capsule Take 1 capsule (20 mg total) by mouth daily. 30 capsule 5  . terazosin (HYTRIN) 1 MG capsule Take 1 capsule (1 mg total) by mouth at bedtime.     No current facility-administered medications for this visit.    Facility-Administered Medications Ordered in Other Visits  Medication Dose Route Frequency Provider Last Rate Last Dose  . heparin lock flush 100 unit/mL  500 Units Intravenous Once Earlie Server, MD      . sodium chloride flush (NS) 0.9 % injection 10 mL  10 mL Intravenous PRN Earlie Server, MD   10 mL at 02/16/18 [phone]  PHYSICAL EXAMINATION:  ECOG PERFORMANCE STATUS: 0 - Asymptomatic   Vitals:   02/16/18 0843  BP: 137/84  Pulse: 69  Resp: 18  Temp: (!) 96 F (35.6 C)  SpO2: 98%   Filed Weights   02/16/18 0843  Weight: 257 lb 12.8 oz (116.9 kg)  Physical Exam  Constitutional: He is oriented to person, place, and time. No distress.  HENT:  Head: Normocephalic and atraumatic.  Nose: Nose normal.  Mouth/Throat: Oropharynx is clear and moist. No oropharyngeal exudate.  Eyes: Pupils are equal, round, and reactive to light. Conjunctivae and EOM are normal. No scleral icterus.  Neck: Normal range of motion. Neck supple.  Cardiovascular: Normal rate, regular rhythm and normal heart sounds. Exam reveals no friction rub.  No murmur heard. Pulmonary/Chest: Effort normal. No respiratory distress. He has no wheezes. He has no rales. He exhibits no tenderness.    Decreased breath sounds bilaterally.  Abdominal: Soft. He exhibits no distension. There is no abdominal tenderness.  Musculoskeletal: Normal range of motion.         General: No tenderness, deformity or edema.  Neurological: He is alert and oriented to person, place, and time. No cranial nerve deficit. He exhibits normal muscle tone. Coordination normal.  Skin: Skin is warm and dry. He is not diaphoretic. No erythema.  Psychiatric: Mood, memory, affect and judgment normal.      LABORATORY DATA: I have personally reviewed the data as listed: CBC    Component Value Date/Time   WBC 4.1 02/16/2018 0801   RBC 4.64 02/16/2018 0801   HGB 12.8 (L) 02/16/2018 0801   HCT 40.5 02/16/2018 0801   PLT 164 02/16/2018 0801   MCV 87.3 02/16/2018 0801   MCH 27.6 02/16/2018 0801   MCHC 31.6 02/16/2018 0801   RDW 13.4 02/16/2018 0801   LYMPHSABS 1.0 02/16/2018 0801   MONOABS 0.3 02/16/2018 0801   EOSABS 0.2 02/16/2018 0801   BASOSABS 0.0 02/16/2018 0801    CMP Latest Ref Rng & Units 02/02/2018 01/19/2018 01/05/2018  Glucose 70 - 99 mg/dL 93 96 124(H)  BUN 8 - 23 mg/dL _0 Creatinine 0.61 - 1.24 mg/dL 1.04 0.98 1.10  Sodium 135 - 145 mmol/L 139 138 139  Potassium 3.5 - 5.1 mmol/L 4.3 4.0 3.8  Chloride 98 - 111 mmol/L 110 109 109  CO2 22 - 32 mmol/L _1 Calcium 8.9 - 10.3 mg/dL 9.0 8.9 9.0  Total Protein 6.5 - 8.1 g/dL 7.3 7.8 7.4  Total Bilirubin 0.3 - 1.2 mg/dL 0.6 1.1 0.8  Alkaline Phos 38 - 126 U/L 66 68 67  AST 15 - 41 U/L _2 ALT 0 - 44 U/L _3 RADIOGRAPHIC STUDIES: I have personally reviewed the radiological images as listed and agreed with the findings in the report. # 09/07/2017 CT chest abdomen pelvis w contrast Stable exam of the chest. No new lesion or progressive interval finding.  Post radiation changes. No metastatic disease.  Renal cysts, aortic atherosclerosis.  # 09/07/2017 Bone scan: no metastatic disease. Degenerative disease.  10/20/2017 Chest CXR  # PowerPort catheter noted with tip coiled in the superior vena cava, similar findings noted on prior exam. Persistent left upper lung infiltrate consistent with  radiation changes. Similar findings noted on prior exams. # CT chest on 11/24/2017 Similar findings of radiation-induced consolidation in the medial left upper lobe.  No findings of recurrent or metastatic disease.  Left infrahilar node is similar and upper  normal size. Looping of right side Port-A-Cath within the right internal jugular vein.  Aortic atherosclerosis.  Emphysema, coronary artery atherosclerosis. Esophageal air-fluid level suggest this mobility or gastroesophageal reflux  ASSESSMENT/PLAN Cancer Staging Squamous cell lung cancer, left ([place]) Staging form: Lung, AJCC 8th Edition - Clinical stage from 09/27/2016: Stage IIIA (cT2a, cN2, cM0) - Signed by Earlie Server, MD on 09/29/2016  1. Squamous cell lung cancer, left ([place])   2. Encounter for antineoplastic immunotherapy   3. Port-A-Cath in place    #Squamous cell lung cancer, stable disease. Tolerates immunotherapy well.  No clinical signs of immunotherapy related toxicities. He is finishing total of 26 cycles of durvalumab treatment today. Labs are reviewed and acceptable to proceed with today's durvalumab treatments.  #Port-A-Cath in place, patient will need port flush every 6 weeks. #Has chronic very mild anemia, stable.  Continue to monitor. Will obtain CT chest with contrast for surveillance.  He will follow-up in the clinic 4 weeks Total face to face encounter time for this patient visit was 25 min. >50% of the time was  spent in counseling and coordination of care.   Earlie Server, MD, PhD Hematology Oncology [facility] at [facility] Pager- [phone] 02/16/18

## 2018-02-28 ENCOUNTER — Ambulatory Visit (INDEPENDENT_AMBULATORY_CARE_PROVIDER_SITE_OTHER): Payer: Medicare HMO

## 2018-02-28 VITALS — BP 122/72 | HR 71 | Temp 98.1°F | Resp 16 | Ht 71.0 in | Wt 253.6 lb

## 2018-02-28 DIAGNOSIS — Z0000 Encounter for general adult medical examination without abnormal findings: Secondary | ICD-10-CM

## 2018-02-28 NOTE — Patient Instructions (Signed)
[name] , Thank you for taking time to come for your Medicare Wellness Visit. I appreciate your ongoing commitment to your health goals. Please review the following plan we discussed and let me know if I can assist you in the future.   Screening recommendations/referrals: Colonoscopy: done in 2016 repeat in 2021 Recommended yearly ophthalmology/optometry visit for glaucoma screening and checkup Recommended yearly dental visit for hygiene and checkup  Vaccinations: Influenza vaccine: done 11/26/17 Pneumococcal vaccine: postponed  Tdap vaccine: due - please contact us if you get a cut or scrape Shingles vaccine: Shingrix discussed. Please contact your pharmacy for coverage information.     Advanced directives: Advance directive discussed with you today. Even though you declined this today please call our office should you change your mind and we can give you the proper paperwork for you to fill out.  Conditions/risks identified: Continue healthy eating habits and physical activity.   Next appointment: Please follow up in one year for your Medicare Annual Wellness visit.    Preventive Care 72 Years and Older, Male Preventive care refers to lifestyle choices and visits with your health care provider that can promote health and wellness. What does preventive care include?  A yearly physical exam. This is also called an annual well check.  Dental exams once or twice a year.  Routine eye exams. Ask your health care provider how often you should have your eyes checked.  Personal lifestyle choices, including:  Daily care of your teeth and gums.  Regular physical activity.  Eating a healthy diet.  Avoiding tobacco and drug use.  Limiting alcohol use.  Practicing safe sex.  Taking low doses of aspirin every day.  Taking vitamin and mineral supplements as recommended by your health care provider. What happens during an annual well check? The services and screenings done by  your health care provider during your annual well check will depend on your age, overall health, lifestyle risk factors, and family history of disease. Counseling  Your health care provider may ask you questions about your:  Alcohol use.  Tobacco use.  Drug use.  Emotional well-being.  Home and relationship well-being.  Sexual activity.  Eating habits.  History of falls.  Memory and ability to understand (cognition).  Work and work Statistician. Screening  You may have the following tests or measurements:  Height, weight, and BMI.  Blood pressure.  Lipid and cholesterol levels. These may be checked every 5 years, or more frequently if you are over 72 years old.  Skin check.  Lung cancer screening. You may have this screening every year starting at age 72 if you have a 30-pack-year history of smoking and currently smoke or have quit within the past 15 years.  Fecal occult blood test (FOBT) of the stool. You may have this test every year starting at age 72.  Flexible sigmoidoscopy or colonoscopy. You may have a sigmoidoscopy every 5 years or a colonoscopy every 10 years starting at age 72.  Prostate cancer screening. Recommendations will vary depending on your family history and other risks.  Hepatitis C blood test.  Hepatitis B blood test.  Sexually transmitted disease (STD) testing.  Diabetes screening. This is done by checking your blood sugar (glucose) after you have not eaten for a while (fasting). You may have this done every 1-3 years.  Abdominal aortic aneurysm (AAA) screening. You may need this if you are a current or former smoker.  Osteoporosis. You may be screened starting at age 72 if you  are at high risk. Talk with your health care provider about your test results, treatment options, and if necessary, the need for more tests. Vaccines  Your health care provider may recommend certain vaccines, such as:  Influenza vaccine. This is recommended every  year.  Tetanus, diphtheria, and acellular pertussis (Tdap, Td) vaccine. You may need a Td booster every 10 years.  Zoster vaccine. You may need this after age 72.  Pneumococcal 13-valent conjugate (PCV13) vaccine. One dose is recommended after age 72.  Pneumococcal polysaccharide (PPSV23) vaccine. One dose is recommended after age 72. Talk to your health care provider about which screenings and vaccines you need and how often you need them. This information is not intended to replace advice given to you by your health care provider. Make sure you discuss any questions you have with your health care provider. Document Released: 02/27/2015 Document Revised: 10/21/2015 Document Reviewed: 12/02/2014 Elsevier Interactive Patient Education  2017 [place] Prevention in the Home Falls can cause injuries. They can happen to people of all ages. There are many things you can do to make your home safe and to help prevent falls. What can I do on the outside of my home?  Regularly fix the edges of walkways and driveways and fix any cracks.  Remove anything that might make you trip as you walk through a door, such as a raised step or threshold.  Trim any bushes or trees on the path to your home.  Use bright outdoor lighting.  Clear any walking paths of anything that might make someone trip, such as rocks or tools.  Regularly check to see if handrails are loose or broken. Make sure that both sides of any steps have handrails.  Any raised decks and porches should have guardrails on the edges.  Have any leaves, snow, or ice cleared regularly.  Use sand or salt on walking paths during winter.  Clean up any spills in your garage right away. This includes oil or grease spills. What can I do in the bathroom?  Use night lights.  Install grab bars by the toilet and in the tub and shower. Do not use towel bars as grab bars.  Use non-skid mats or decals in the tub or shower.  If you  need to sit down in the shower, use a plastic, non-slip stool.  Keep the floor dry. Clean up any water that spills on the floor as soon as it happens.  Remove soap buildup in the tub or shower regularly.  Attach bath mats securely with double-sided non-slip rug tape.  Do not have throw rugs and other things on the floor that can make you trip. What can I do in the bedroom?  Use night lights.  Make sure that you have a light by your bed that is easy to reach.  Do not use any sheets or blankets that are too big for your bed. They should not hang down onto the floor.  Have a firm chair that has side arms. You can use this for support while you get dressed.  Do not have throw rugs and other things on the floor that can make you trip. What can I do in the kitchen?  Clean up any spills right away.  Avoid walking on wet floors.  Keep items that you use a lot in easy-to-reach places.  If you need to reach something above you, use a strong step stool that has a grab bar.  Keep electrical cords out  of the way.  Do not use floor polish or wax that makes floors slippery. If you must use wax, use non-skid floor wax.  Do not have throw rugs and other things on the floor that can make you trip. What can I do with my stairs?  Do not leave any items on the stairs.  Make sure that there are handrails on both sides of the stairs and use them. Fix handrails that are broken or loose. Make sure that handrails are as long as the stairways.  Check any carpeting to make sure that it is firmly attached to the stairs. Fix any carpet that is loose or worn.  Avoid having throw rugs at the top or bottom of the stairs. If you do have throw rugs, attach them to the floor with carpet tape.  Make sure that you have a light switch at the top of the stairs and the bottom of the stairs. If you do not have them, ask someone to add them for you. What else can I do to help prevent falls?  Wear shoes  that:  Do not have high heels.  Have rubber bottoms.  Are comfortable and fit you well.  Are closed at the toe. Do not wear sandals.  If you use a stepladder:  Make sure that it is fully opened. Do not climb a closed stepladder.  Make sure that both sides of the stepladder are locked into place.  Ask someone to hold it for you, if possible.  Clearly mark and make sure that you can see:  Any grab bars or handrails.  First and last steps.  Where the edge of each step is.  Use tools that help you move around (mobility aids) if they are needed. These include:  Canes.  Walkers.  Scooters.  Crutches.  Turn on the lights when you go into a dark area. Replace any light bulbs as soon as they burn out.  Set up your furniture so you have a clear path. Avoid moving your furniture around.  If any of your floors are uneven, fix them.  If there are any pets around you, be aware of where they are.  Review your medicines with your doctor. Some medicines can make you feel dizzy. This can increase your chance of falling. Ask your doctor what other things that you can do to help prevent falls. This information is not intended to replace advice given to you by your health care provider. Make sure you discuss any questions you have with your health care provider. Document Released: 11/27/2008 Document Revised: 07/09/2015 Document Reviewed: 03/07/2014 Elsevier Interactive Patient Education  2017 [employer].

## 2018-02-28 NOTE — Progress Notes (Signed)
Subjective:   [name] is a 73 y.o. male who presents for an Initial Medicare Annual Wellness Visit.  Review of Systems   Cardiac Risk Factors include: advanced age (>13men, >40 women);male gender;obesity (BMI >30kg/m2)    Objective:    Today's Vitals   02/28/18 1601  BP: 122/72  Pulse: 71  Resp: 16  Temp: 98.1 F (36.7 C)  TempSrc: Oral  SpO2: 98%  Weight: 253 lb 9.6 oz (115 kg)  Height: 5\' 11"  (1.803 m)   Body mass index is 35.37 kg/m.  Advanced Directives 02/28/2018 02/16/2018 02/02/2018 01/19/2018 01/05/2018 12/15/2017 12/01/2017  Does Patient Have a Medical Advance Directive? No No No No No No No  Would patient like information on creating a medical advance directive? No - Patient declined - - - - - -    Current Medications (verified) Outpatient Encounter Medications as of 02/28/2018  Medication Sig  . acetaminophen (TYLENOL) 500 MG tablet Take 1,000 mg by mouth every 4 (four) hours as needed.   Marland Kitchen albuterol (PROVENTIL HFA;VENTOLIN HFA) 108 (90 Base) MCG/ACT inhaler Inhale 2 puffs into the lungs every 6 (six) hours as needed for wheezing or shortness of breath.  . lidocaine-prilocaine (EMLA) cream Apply 1 application topically as needed.  . mupirocin ointment (BACTROBAN) 2 % Apply 1 application topically 2 (two) times daily as needed.  Marland Kitchen omeprazole (PRILOSEC) 20 MG capsule Take 1 capsule (20 mg total) by mouth daily.  Marland Kitchen terazosin (HYTRIN) 1 MG capsule Take 1 capsule (1 mg total) by mouth at bedtime.  . Cholecalciferol (VITAMIN D3) 2000 units capsule Take 1 capsule (2,000 Units total) by mouth daily.  . cyanocobalamin 500 MCG tablet Take 500 mcg by mouth daily.  . fluticasone (FLONASE) 50 MCG/ACT nasal spray Place 2 sprays into both nostrils daily. (Patient taking differently: Place 2 sprays into both nostrils daily as needed. )  . fluticasone (FLOVENT HFA) 44 MCG/ACT inhaler Inhale 2 puffs into the lungs 2 (two) times daily. (Patient not taking: Reported on  02/28/2018)  . [DISCONTINUED] benzonatate (TESSALON PERLES) 100 MG capsule Take 1 capsule (100 mg total) by mouth 3 (three) times daily as needed for cough.  . [DISCONTINUED] Dextromethorphan-guaiFENesin 10-100 MG/5ML liquid Take 5 mLs by mouth every 12 (twelve) hours.  . [DISCONTINUED] prochlorperazine (COMPAZINE) 10 MG tablet Take 1 tablet (10 mg total) by mouth every 6 (six) hours as needed (Nausea or vomiting).   No facility-administered encounter medications on file as of 02/28/2018.     Allergies (verified) Patient has no known allergies.   History: Past Medical History:  Diagnosis Date  . Enlarged prostate   . GERD (gastroesophageal reflux disease)   . Squamous cell lung cancer, left ([place]) 08/2016   Past Surgical History:  Procedure Laterality Date  . NO PAST SURGERIES    . PORTACATH PLACEMENT Right 10/05/2016   Procedure: INSERTION PORT-A-CATH;  Surgeon: [name], MD;  Location: [facility] ORS;  Service: General;  Laterality: Right;   Family History  Problem Relation Age of Onset  . Lymphoma Mother   . Diabetes Mother   . Lung cancer Sister   . Lung cancer Brother   . Lung cancer Brother    Social History   Socioeconomic History  . Marital status: Single    Spouse name: Not on file  . Number of children: 2  . Years of education: Not on file  . Highest education level: 10th grade  Occupational History  . Occupation: truck Geophysicist/field seismologist    Comment: full  time  Social Needs  . Financial resource strain: Not hard at all  . Food insecurity:    Worry: Never true    Inability: Never true  . Transportation needs:    Medical: No    Non-medical: No  Tobacco Use  . Smoking status: Former Smoker    Packs/day: 1.00    Years: 15.00    Pack years: 15.00    Types: Cigarettes    Last attempt to quit: 09/28/1992    Years since quitting: 25.4  . Smokeless tobacco: Never Used  Substance and Sexual Activity  . Alcohol use: No  . Drug use: No  . Sexual activity: Not on file    Lifestyle  . Physical activity:    Days per week: 5 days    Minutes per session: 20 min  . Stress: Not at all  Relationships  . Social connections:    Talks on phone: More than three times a week    Gets together: More than three times a week    Attends religious service: Never    Active member of club or organization: No    Attends meetings of clubs or organizations: Never    Relationship status: Divorced  Other Topics Concern  . Not on file  Social History Narrative  . Not on file   Tobacco Counseling Counseling given: Not Answered   Clinical Intake:  Pre-visit preparation completed: Yes  Pain : No/denies pain     Nutritional Status: BMI > 30  Obese Nutritional Risks: None Diabetes: No  How often do you need to have someone help you when you read instructions, pamphlets, or other written materials from your doctor or pharmacy?: 1 - Never What is the last grade level you completed in school?: 10th grade  Interpreter Needed?: No  Information entered by :: Clemetine Marker LPN  Activities of Daily Living In your present state of health, do you have any difficulty performing the following activities: 02/28/2018 10/06/2017  Hearing? N N  Comment declines hearing aids -  Vision? N N  Difficulty concentrating or making decisions? N N  Walking or climbing stairs? N N  Dressing or bathing? N N  Doing errands, shopping? N N  Preparing Food and eating ? N -  Using the Toilet? N -  In the past six months, have you accidently leaked urine? N -  Do you have problems with loss of bowel control? N -  Managing your Medications? N -  Managing your Finances? N -  Housekeeping or managing your Housekeeping? N -  Some recent data might be hidden     Immunizations and Health Maintenance Immunization History  Administered Date(s) Administered  . Influenza, High Dose Seasonal PF 01/13/2017, 11/26/2017   Health Maintenance Due  Topic Date Due  . Hepatitis C Screening   05-Mar-1945  . TETANUS/TDAP  07/25/1965  . PNA vac Low Risk Adult (2 of 2 - PPSV23) 02/15/2016    Patient Care Team: Glean Hess, MD as PCP - General (Internal Medicine) Earlie Server, MD as Consulting Physician (Oncology)  Indicate any recent Medical Services you may have received from other than [facility] providers in the past year (date may be approximate).    Assessment:   This is a routine wellness examination for [place].  Hearing/Vision screen Hearing Screening Comments: Pt has no difficulty hearing  Vision Screening Comments: Past due for eye exam. Plans to go to eye dr through the [place]  Dietary issues and exercise activities discussed: Current  Exercise Habits: Home exercise routine, Type of exercise: walking, Time (Minutes): 20, Frequency (Times/Week): 5, Weekly Exercise (Minutes/Week): 100, Intensity: Mild, Exercise limited by: None identified  Goals   None    Depression Screen PHQ 2/9 Scores 02/28/2018 05/26/2017 09/07/2016  PHQ - 2 Score 0 0 0    Fall Risk Fall Risk  02/28/2018 02/02/2018 01/05/2018 07/28/2017 05/26/2017  Falls in the past year? 0 0 0 No No  Number falls in past yr: 0 - - - -    FALL RISK PREVENTION PERTAINING TO THE HOME:  Any stairs in or around the home WITH handrails? Yes  Home free of loose throw rugs in walkways, pet beds, electrical cords, etc? Yes  Adequate lighting in your home to reduce risk of falls? Yes   ASSISTIVE DEVICES UTILIZED TO PREVENT FALLS:  Life alert? No  Use of a cane, walker or w/c? No  Grab bars in the bathroom? No  Shower chair or bench in shower? No  Elevated toilet seat or a handicapped toilet? No   DME ORDERS:  DME order needed?  No   TIMED UP AND GO:  Was the test performed? Yes .  Length of time to ambulate 10 feet: 6 sec.   GAIT:  Appearance of gait: Gait stead-fast and without the use of an assistive device.  Education: Fall risk prevention has been discussed.  Intervention(s) required? No     Cognitive  Function:     6CIT Screen 02/28/2018  What Year? 0 points  What month? 0 points  What time? 0 points  Count back from 20 0 points  Months in reverse 0 points  Repeat phrase 0 points  Total Score 0    Screening Tests Health Maintenance  Topic Date Due  . Hepatitis C Screening  [birth year]  . TETANUS/TDAP  07/25/1965  . PNA vac Low Risk Adult (2 of 2 - PPSV23) 02/15/2016  . COLONOSCOPY  02/15/2024  . INFLUENZA VACCINE  Completed    Qualifies for Shingles Vaccine? Yes . Due for Shingrix. Education has been provided regarding the importance of this vaccine. Pt has been advised to call insurance company to determine out of pocket expense. Advised may also receive vaccine at local pharmacy or Health Dept. Verbalized acceptance and understanding.  Tdap: Although this vaccine is not a covered service during a Wellness Exam, does the patient still wish to receive this vaccine today?  No .  Education has been provided regarding the importance of this vaccine. Advised may receive this vaccine at local pharmacy or Health Dept. Aware to provide a copy of the vaccination record if obtained from local pharmacy or Health Dept. Verbalized acceptance and understanding.  Flu Vaccine: Up to date   Pneumococcal Vaccine: Due for Pneumococcal vaccine. Does the patient want to receive this vaccine today?  No . Education has been provided regarding the importance of this vaccine but still declined. Advised may receive this vaccine at local pharmacy or Health Dept. Aware to provide a copy of the vaccination record if obtained from local pharmacy or Health Dept. Verbalized acceptance and understanding.  Cancer Screenings:  Colorectal Screening: Completed 2016. Repeat every 5 years;   Lung Cancer Screening: (Low Dose CT Chest recommended if Age 47-80 years, 30 pack-year currently smoking OR have quit w/in 15years.) does not qualify.     Additional Screening:  Hepatitis C Screening: does qualify;  postponed  Vision Screening: Recommended annual ophthalmology exams for early detection of glaucoma and other disorders of the eye.  Is the patient up to date with their annual eye exam?  No  Who is the provider or what is the name of the office in which the pt attends annual eye exams? No established provider If pt is not established with a provider, would they like to be referred to a provider to establish care? No . Ophthalmology referral has been placed. Pt aware the office will call re: appt.  Dental Screening: Recommended annual dental exams for proper oral hygiene  Community Resource Referral:  CRR required this visit?  No       Plan:    I have personally reviewed and addressed the Medicare Annual Wellness questionnaire and have noted the following in the patient's chart:  A. Medical and social history B. Use of alcohol, tobacco or illicit drugs  C. Current medications and supplements D. Functional ability and status E.  Nutritional status F.  Physical activity G. Advance directives H. List of other physicians I.  Hospitalizations, surgeries, and ER visits in previous 12 months J.  [place] such as hearing and vision if needed, cognitive and depression L. Referrals and appointments   In addition, I have reviewed and discussed with patient certain preventive protocols, quality metrics, and best practice recommendations. A written personalized care plan for preventive services as well as general preventive health recommendations were provided to patient.   Signed,  Clemetine Marker, LPN Nurse Health Advisor   Nurse Notes: none

## 2018-03-05 ENCOUNTER — Ambulatory Visit: Payer: Medicare HMO

## 2018-03-08 ENCOUNTER — Ambulatory Visit
Admission: RE | Admit: 2018-03-08 | Discharge: 2018-03-08 | Disposition: A | Discharge status: Home / Self Care | Payer: Medicare HMO | Source: Ambulatory Visit | Attending: Oncology | Admitting: Oncology

## 2018-03-08 DIAGNOSIS — C3492 Malignant neoplasm of unspecified part of left bronchus or lung: Secondary | ICD-10-CM | POA: Diagnosis not present

## 2018-03-08 MED ORDER — IOHEXOL 300 MG/ML  SOLN
75.0000 mL | Freq: Once | INTRAMUSCULAR | Status: AC | PRN
  Administered 2018-03-08: 75 mL via INTRAVENOUS

## 2018-03-13 IMAGING — CT CT BIOPSY
2 of 3 series · 14 of 32 positions shown, 19 images · non-contrast
Comparison: none

INDICATION: 71-year-old male with left upper lobe pulmonary mass concerning for
primary bronchogenic carcinoma.

[Series 2: i-spiral 5.0 b30f · axial · 0.59mm/px · z∈[+1372,+1417]mm · 7 of 19 slices shown, 12 images (1 of 2)]
[im 3/19  soft-tissue]
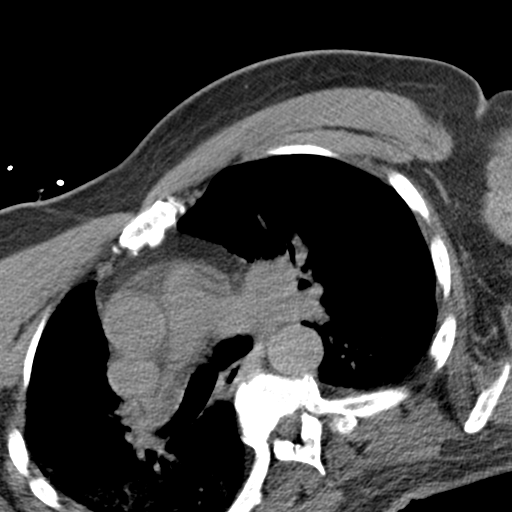
[im 3/19  bone]
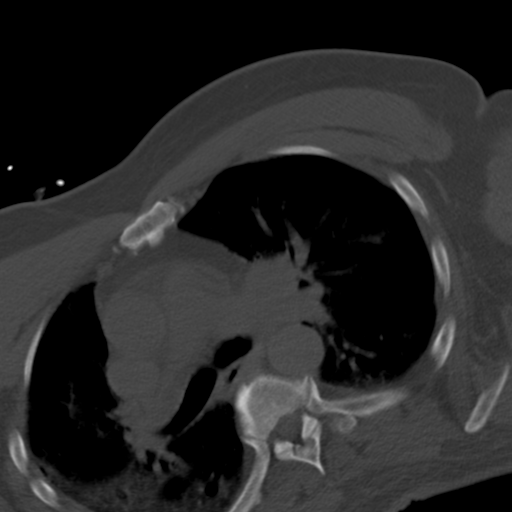
[im 5/19  soft-tissue]
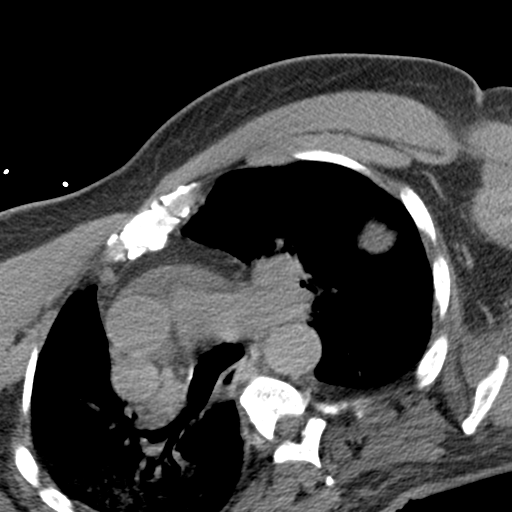
[im 7/19  soft-tissue]
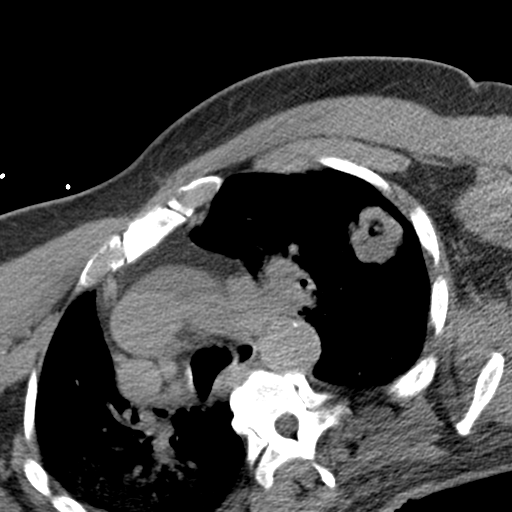
[im 10/19  soft-tissue]
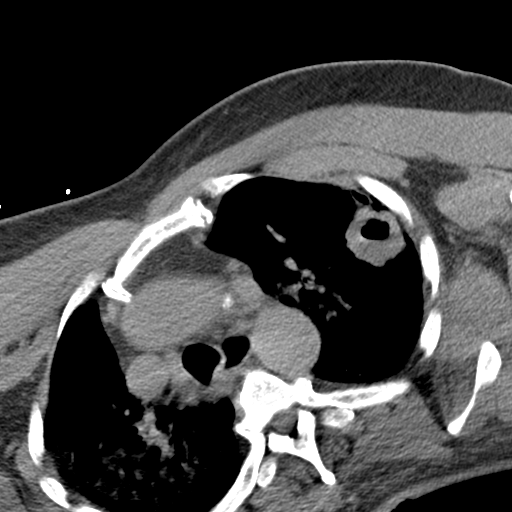
[im 10/19  lung]
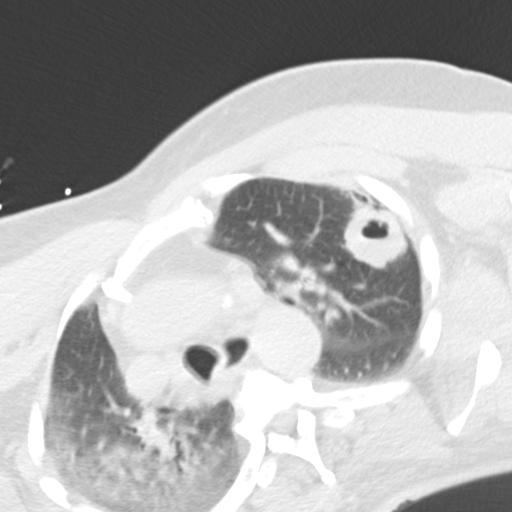
[im 12/19  soft-tissue]
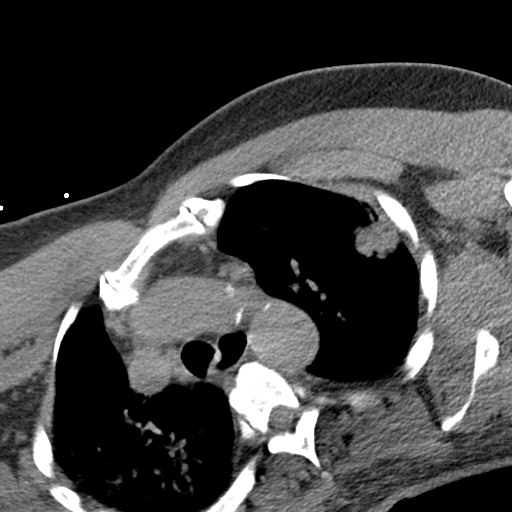
[im 12/19  lung]
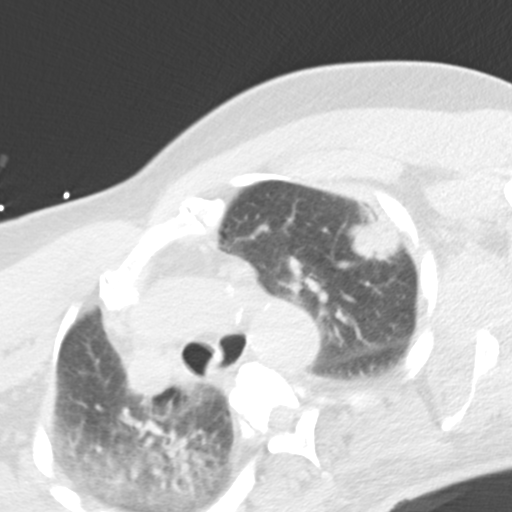
[im 14/19  soft-tissue]
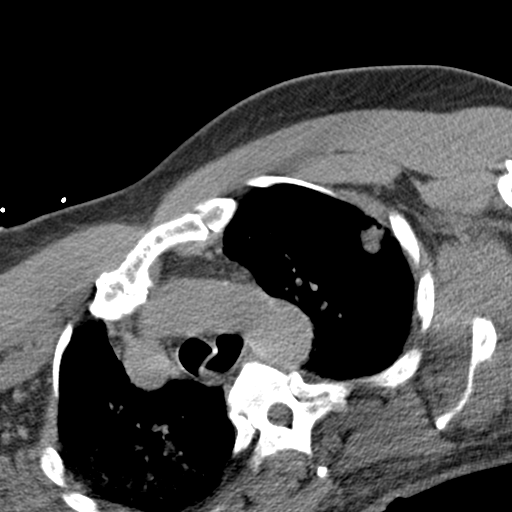
[im 14/19  lung]
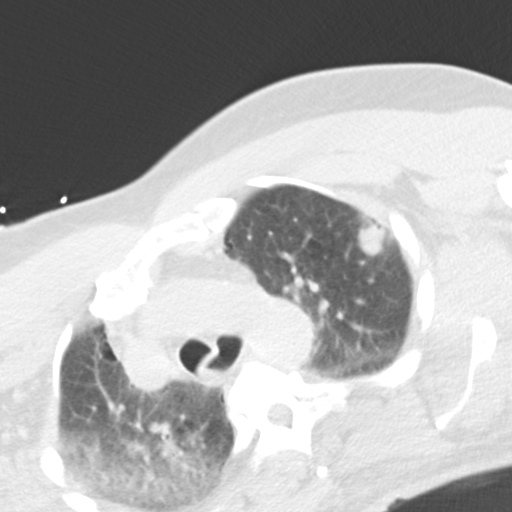
[im 16/19  soft-tissue]
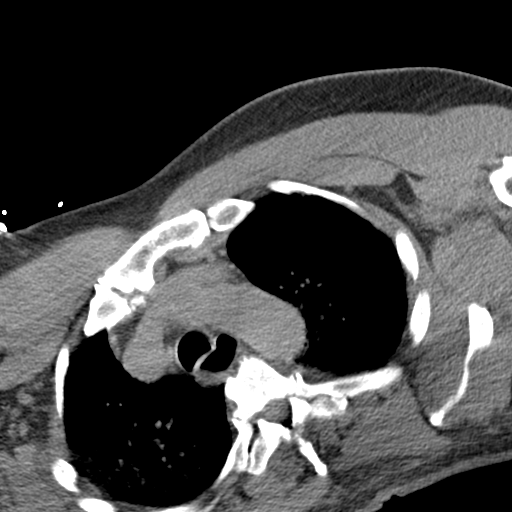
[im 16/19  lung]
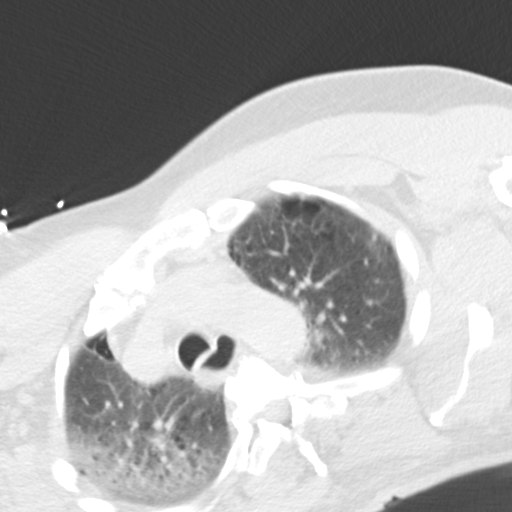

[Series 4: i-spiral 5.0 b30f · axial · 0.59mm/px · z∈[+1372,+1417]mm · 7 of 19 slices shown (2 of 2)]
[im 3/19  soft-tissue]
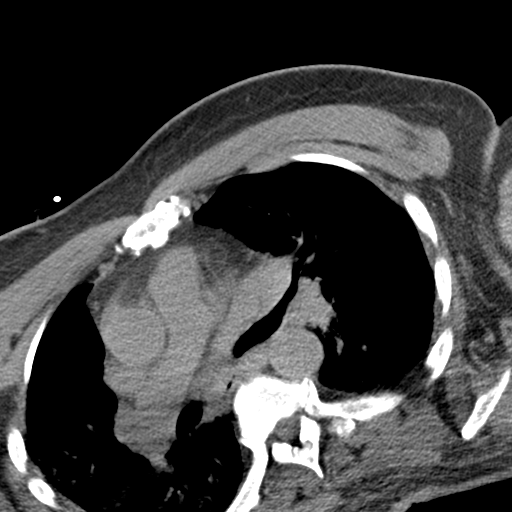
[im 5/19  soft-tissue]
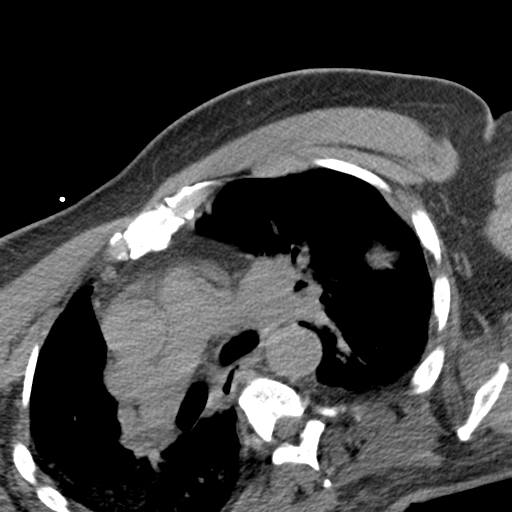
[im 7/19  soft-tissue]
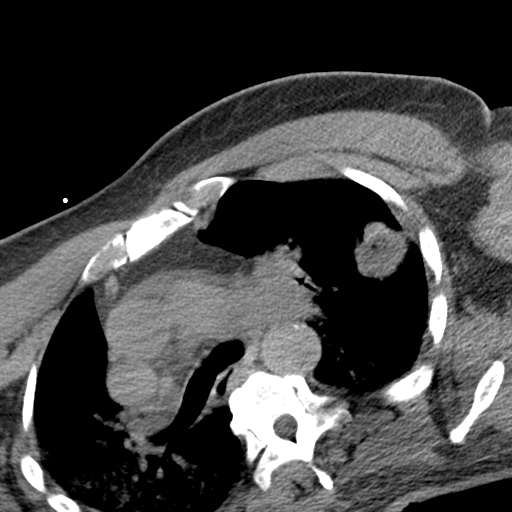
[im 10/19  soft-tissue]
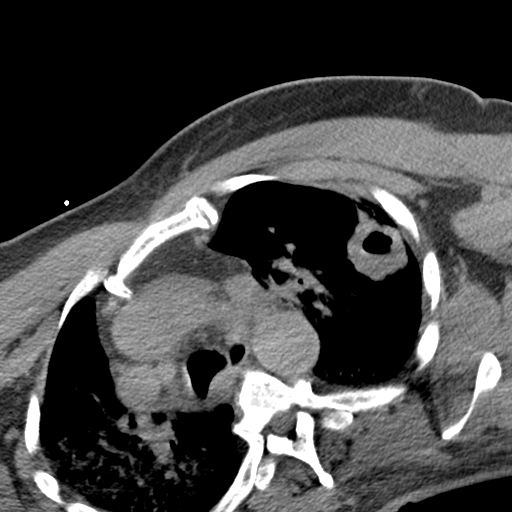
[im 12/19  soft-tissue]
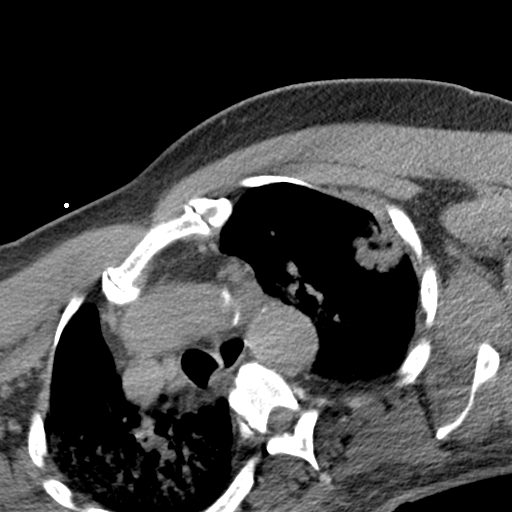
[im 14/19  soft-tissue]
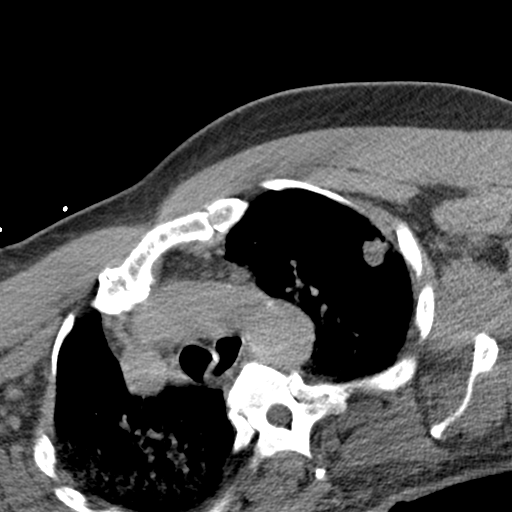
[im 16/19  soft-tissue]
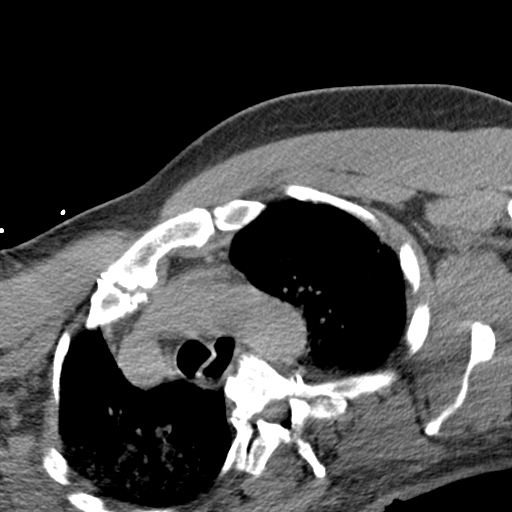

[14 of 32 positions shown; findings below may reference images not displayed]

EXAM:
CT-guided biopsy left upper lobe pulmonary nodule

MEDICATIONS:
None.

ANESTHESIA/SEDATION:
Fentanyl 2 mcg IV; Versed 100 mg IV

Moderate Sedation Time:  14 minutes

The patient was continuously monitored during the procedure by the
interventional radiology nurse under my direct supervision.

FLUOROSCOPY TIME:  Fluoroscopy Time: 0 minutes 0 seconds (0 mGy).

COMPLICATIONS:
None immediate.

Estimated blood loss:  0

PROCEDURE:
Informed written consent was obtained from the patient after a
thorough discussion of the procedural risks, benefits and
alternatives. All questions were addressed. Maximal Sterile Barrier
Technique was utilized including caps, mask, sterile gowns, sterile
gloves, sterile drape, hand hygiene and skin antiseptic. A timeout
was performed prior to the initiation of the procedure.

A planning axial CT scan was performed. The nodule in the left upper
lobe was successfully identified. A suitable skin entry site was
selected and marked. The region was then sterilely prepped and
draped in standard fashion using Betadine skin prep. Local
anesthesia was attained by infiltration with 1% lidocaine. A small
dermatotomy was made. Under intermittent CT fluoroscopic guidance, a
17 gauge trocar needle was advanced into the lung and positioned at
the margin of the nodule.

Multiple 18 gauge core biopsies were then coaxially obtained using
the BioPince automated biopsy device. Biopsy specimens were placed
in formalin and delivered to pathology for further analysis. The
biopsy device and introducer needle were removed. Post biopsy axial
CT imaging demonstrates no evidence of immediate complication. There
is no pneumothorax. Mild perilesional alveolar hemorrhage is not
unexpected. The patient tolerated the procedure well.
IMPRESSION: Technically successful CT-guided biopsy left upper lobe pulmonary
mass.

## 2018-03-16 ENCOUNTER — Inpatient Hospital Stay: Payer: Medicare HMO | Admitting: Oncology

## 2018-03-16 ENCOUNTER — Other Ambulatory Visit: Payer: Self-pay

## 2018-03-16 ENCOUNTER — Inpatient Hospital Stay: Payer: Medicare HMO

## 2018-03-16 ENCOUNTER — Encounter: Payer: Self-pay | Admitting: Oncology

## 2018-03-16 VITALS — BP 143/89 | HR 53 | Temp 97.3°F | Wt 255.1 lb

## 2018-03-16 DIAGNOSIS — Z801 Family history of malignant neoplasm of trachea, bronchus and lung: Secondary | ICD-10-CM

## 2018-03-16 DIAGNOSIS — Z5112 Encounter for antineoplastic immunotherapy: Secondary | ICD-10-CM

## 2018-03-16 DIAGNOSIS — K219 Gastro-esophageal reflux disease without esophagitis: Secondary | ICD-10-CM

## 2018-03-16 DIAGNOSIS — I2510 Atherosclerotic heart disease of native coronary artery without angina pectoris: Secondary | ICD-10-CM | POA: Diagnosis not present

## 2018-03-16 DIAGNOSIS — Z95828 Presence of other vascular implants and grafts: Secondary | ICD-10-CM

## 2018-03-16 DIAGNOSIS — R05 Cough: Secondary | ICD-10-CM | POA: Diagnosis not present

## 2018-03-16 DIAGNOSIS — Z923 Personal history of irradiation: Secondary | ICD-10-CM

## 2018-03-16 DIAGNOSIS — I700 Atherosclerosis of aorta: Secondary | ICD-10-CM

## 2018-03-16 DIAGNOSIS — Z87891 Personal history of nicotine dependence: Secondary | ICD-10-CM

## 2018-03-16 DIAGNOSIS — C3492 Malignant neoplasm of unspecified part of left bronchus or lung: Secondary | ICD-10-CM | POA: Diagnosis not present

## 2018-03-16 DIAGNOSIS — Z9221 Personal history of antineoplastic chemotherapy: Secondary | ICD-10-CM

## 2018-03-16 DIAGNOSIS — Z79899 Other long term (current) drug therapy: Secondary | ICD-10-CM

## 2018-03-16 LAB — CBC WITH DIFFERENTIAL/PLATELET
Abs Immature Granulocytes: 0.01 10*3/uL (ref 0.00–0.07)
Basophils Absolute: 0 10*3/uL (ref 0.0–0.1)
Basophils Relative: 0 %
Eosinophils Absolute: 0.1 10*3/uL (ref 0.0–0.5)
Eosinophils Relative: 2 %
HCT: 41.5 % (ref 39.0–52.0)
Hemoglobin: 13.1 g/dL (ref 13.0–17.0)
Immature Granulocytes: 0 %
LYMPHS PCT: 11 %
Lymphs Abs: 0.6 10*3/uL — ABNORMAL LOW (ref 0.7–4.0)
MCH: 27.8 pg (ref 26.0–34.0)
MCHC: 31.6 g/dL (ref 30.0–36.0)
MCV: 87.9 fL (ref 80.0–100.0)
Monocytes Absolute: 0.6 10*3/uL (ref 0.1–1.0)
Monocytes Relative: 10 %
Neutro Abs: 4.3 10*3/uL (ref 1.7–7.7)
Neutrophils Relative %: 77 %
Platelets: 151 10*3/uL (ref 150–400)
RBC: 4.72 MIL/uL (ref 4.22–5.81)
RDW: 13.3 % (ref 11.5–15.5)
WBC: 5.6 10*3/uL (ref 4.0–10.5)
nRBC: 0 % (ref 0.0–0.2)

## 2018-03-16 LAB — COMPREHENSIVE METABOLIC PANEL
ALT: 19 U/L (ref 0–44)
AST: 20 U/L (ref 15–41)
Albumin: 4 g/dL (ref 3.5–5.0)
Alkaline Phosphatase: 71 U/L (ref 38–126)
Anion gap: 7 (ref 5–15)
BILIRUBIN TOTAL: 1.5 mg/dL — AB (ref 0.3–1.2)
BUN: 16 mg/dL (ref 8–23)
CHLORIDE: 107 mmol/L (ref 98–111)
CO2: 24 mmol/L (ref 22–32)
Calcium: 9 mg/dL (ref 8.9–10.3)
Creatinine, Ser: 0.97 mg/dL (ref 0.61–1.24)
GFR calc Af Amer: >60 mL/min (ref >60)
Glucose, Bld: 107 mg/dL — ABNORMAL HIGH (ref 70–99)
POTASSIUM: 3.9 mmol/L (ref 3.5–5.1)
Sodium: 138 mmol/L (ref 135–145)
TOTAL PROTEIN: 7.7 g/dL (ref 6.5–8.1)

## 2018-03-16 LAB — TSH: TSH: 0.031 u[IU]/mL — ABNORMAL LOW (ref 0.350–4.500)

## 2018-03-17 NOTE — Progress Notes (Signed)
[facility] Follow Up Visit  Patient Care Team: Glean Hess, MD as PCP - General (Internal Medicine) Earlie Server, MD as Consulting Physician (Oncology)  REASON FOR VISIT:  Follow up for evaluation prior to maintenance immunotherapy  HISTORY OF PRESENT ILLNESS/ PERTINENT ONCOLOGY HISTORY #  Stage IIIA lung squamous cell cancer. S/p concurrent chemotherapy and Radiation.[ completed Oct 2018], started on Durvalumab in Nov 2019.  06/09/17 CT chest. scan was presented on tumor board on 06/22/2017, consensus is stable disease, with postradiation changes. Monitor # Pathology: TPS 50%. 09/22/2016 left upper lobe biopsy showed squamous cell carcinoma. Foundation one test: ordered prior to patient first visit. Showed NO EGFR, ROS1, ALK, BRAF, MET mutation.   Cancer Treatment: S/p concurrent carbo/taxol and Radiation. Finished Oct 2018. Currently Durvalumab Q2 weeks. Treatment was interrupted after cycle 5 due to radiation pneumonitis. After steroid treatment, back on Durvalumab Q2weeks.   INTERVAL HISTORY Today [name] presented for follow up for assessment prior to immunotherapy maintenance for treatment of lung squamous cell carcinoma stage IIIA Continues to do well at baseline.  He has finished 26 cycles of durvalumab treatment on 03/15/2018. No new complaints today.  Continues to work as a Administrator.  Chronic cough at baseline.  Denies any shortness of breath.  Accompanied by daughter and wife. hortness of breath. Accompanied by daughter and wife..   Review of Systems  Constitutional: Negative for appetite change, chills, diaphoresis, fatigue, fever and unexpected weight change.  HENT:   Negative for hearing loss, lump/mass, nosebleeds, sore throat and voice change.   Eyes: Negative for eye problems and icterus.  Respiratory: Positive for cough. Negative for chest tightness, hemoptysis, shortness of breath and wheezing.   Cardiovascular: Negative for chest pain  and leg swelling.  Gastrointestinal: Negative for abdominal distention, abdominal pain, blood in stool, diarrhea, nausea and rectal pain.  Endocrine: Negative for hot flashes.  Genitourinary: Negative for bladder incontinence, difficulty urinating, dysuria, frequency, hematuria and nocturia.   Musculoskeletal: Negative for arthralgias, back pain, flank pain, gait problem and myalgias.  Skin: Negative for itching and rash.  Neurological: Negative for dizziness, gait problem, headaches, light-headedness, numbness and seizures.  Hematological: Negative for adenopathy. Does not bruise/bleed easily.  Psychiatric/Behavioral: Negative for confusion and decreased concentration. The patient is not nervous/anxious.     MEDICAL HISTORY: Past Medical History:  Diagnosis Date  . Enlarged prostate   . GERD (gastroesophageal reflux disease)   . Squamous cell lung cancer, left ([place]) 08/2016    SURGICAL HISTORY: Past Surgical History:  Procedure Laterality Date  . NO PAST SURGERIES    . PORTACATH PLACEMENT Right 10/05/2016   Procedure: INSERTION PORT-A-CATH;  Surgeon: [name], MD;  Location: [facility] ORS;  Service: General;  Laterality: Right;    SOCIAL HISTORY: Social History   Socioeconomic History  . Marital status: Single    Spouse name: Not on file  . Number of children: 2  . Years of education: Not on file  . Highest education level: 10th grade  Occupational History  . Occupation: truck Geophysicist/field seismologist    Comment: full time  Social Needs  . Financial resource strain: Not hard at all  . Food insecurity:    Worry: Never true    Inability: Never true  . Transportation needs:    Medical: No    Non-medical: No  Tobacco Use  . Smoking status: Former Smoker    Packs/day: 1.00    Years: 15.00    Pack years: 15.00  Types: Cigarettes    Last attempt to quit: 09/28/1992    Years since quitting: 25.4  . Smokeless tobacco: Never Used  Substance and Sexual Activity  . Alcohol use: No  .  Drug use: No  . Sexual activity: Not on file  Lifestyle  . Physical activity:    Days per week: 5 days    Minutes per session: 20 min  . Stress: Not at all  Relationships  . Social connections:    Talks on phone: More than three times a week    Gets together: More than three times a week    Attends religious service: Never    Active member of club or organization: No    Attends meetings of clubs or organizations: Never    Relationship status: Divorced  . Intimate partner violence:    Fear of current or ex partner: No    Emotionally abused: No    Physically abused: No    Forced sexual activity: No  Other Topics Concern  . Not on file  Social History Narrative  . Not on file    FAMILY HISTORY Family History  Problem Relation Age of Onset  . Lymphoma Mother   . Diabetes Mother   . Lung cancer Sister   . Lung cancer Brother   . Lung cancer Brother     ALLERGIES:  has No Known Allergies.  MEDICATIONS:  Current Outpatient Medications  Medication Sig Dispense Refill  . acetaminophen (TYLENOL) 500 MG tablet Take 1,000 mg by mouth every 4 (four) hours as needed.     Marland Kitchen albuterol (PROVENTIL HFA;VENTOLIN HFA) 108 (90 Base) MCG/ACT inhaler Inhale 2 puffs into the lungs every 6 (six) hours as needed for wheezing or shortness of breath. 1 Inhaler 2  . Cholecalciferol (VITAMIN D3) 2000 units capsule Take 1 capsule (2,000 Units total) by mouth daily.    . cyanocobalamin 500 MCG tablet Take 500 mcg by mouth daily.    . fluticasone (FLONASE) 50 MCG/ACT nasal spray Place 2 sprays into both nostrils daily. (Patient taking differently: Place 2 sprays into both nostrils daily as needed. ) 16 g 6  . fluticasone (FLOVENT HFA) 44 MCG/ACT inhaler Inhale 2 puffs into the lungs 2 (two) times daily. 1 Inhaler 12  . lidocaine-prilocaine (EMLA) cream Apply 1 application topically as needed. 30 g 3  . mupirocin ointment (BACTROBAN) 2 % Apply 1 application topically 2 (two) times daily as needed. 22 g  2  . omeprazole (PRILOSEC) 20 MG capsule Take 1 capsule (20 mg total) by mouth daily. 30 capsule 5  . terazosin (HYTRIN) 1 MG capsule Take 1 capsule (1 mg total) by mouth at bedtime.     No current facility-administered medications for this visit.     PHYSICAL EXAMINATION:  ECOG PERFORMANCE STATUS: 0 - Asymptomatic   Vitals:   03/16/18 1012  BP: (!) 143/89  Pulse: (!) 53  Temp: (!) 97.3 F (36.3 C)   Filed Weights   03/16/18 1012  Weight: 255 lb 2 oz (115.7 kg)  Physical Exam  Constitutional: He is oriented to person, place, and time. No distress.  HENT:  Head: Normocephalic and atraumatic.  Nose: Nose normal.  Mouth/Throat: Oropharynx is clear and moist. No oropharyngeal exudate.  Eyes: Pupils are equal, round, and reactive to light. Conjunctivae and EOM are normal. No scleral icterus.  Neck: Normal range of motion. Neck supple.  Cardiovascular: Normal rate, regular rhythm and normal heart sounds. Exam reveals no friction rub.  No murmur  heard. Pulmonary/Chest: Effort normal and breath sounds normal. No respiratory distress. He has no wheezes. He has no rales. He exhibits no tenderness.  Abdominal: Soft. He exhibits no distension. There is no abdominal tenderness.  Musculoskeletal: Normal range of motion.        General: No tenderness, deformity or edema.  Neurological: He is alert and oriented to person, place, and time. No cranial nerve deficit. He exhibits normal muscle tone. Coordination normal.  Skin: Skin is warm and dry. He is not diaphoretic. No erythema.  Psychiatric: Mood, memory, affect and judgment normal.      LABORATORY DATA: I have personally reviewed the data as listed: CBC    Component Value Date/Time   WBC 5.6 03/16/2018 0939   RBC 4.72 03/16/2018 0939   HGB 13.1 03/16/2018 0939   HCT 41.5 03/16/2018 0939   PLT 151 03/16/2018 0939   MCV 87.9 03/16/2018 0939   MCH 27.8 03/16/2018 0939   MCHC 31.6 03/16/2018 0939   RDW 13.3 03/16/2018 0939    LYMPHSABS 0.6 (L) 03/16/2018 0939   MONOABS 0.6 03/16/2018 0939   EOSABS 0.1 03/16/2018 0939   BASOSABS 0.0 03/16/2018 0939    CMP Latest Ref Rng & Units 03/16/2018 02/16/2018 02/02/2018  Glucose 70 - 99 mg/dL 107(H) 94 93  BUN 8 - 23 mg/dL '16 13 21  ' Creatinine 0.61 - 1.24 mg/dL 0.97 1.06 1.04  Sodium 135 - 145 mmol/L 138 140 139  Potassium 3.5 - 5.1 mmol/L 3.9 3.8 4.3  Chloride 98 - 111 mmol/L 107 111 110  CO2 22 - 32 mmol/L '24 23 24  ' Calcium 8.9 - 10.3 mg/dL 9.0 8.8(L) 9.0  Total Protein 6.5 - 8.1 g/dL 7.7 7.2 7.3  Total Bilirubin 0.3 - 1.2 mg/dL 1.5(H) 0.7 0.6  Alkaline Phos 38 - 126 U/L 71 66 66  AST 15 - 41 U/L '20 19 18  ' ALT 0 - 44 U/L '19 19 18   ' RADIOGRAPHIC STUDIES: I have personally reviewed the radiological images as listed and agreed with the findings in the report. # 09/07/2017 CT chest abdomen pelvis w contrast Stable exam of the chest. No new lesion or progressive interval finding.  Post radiation changes. No metastatic disease.  Renal cysts, aortic atherosclerosis.  # 09/07/2017 Bone scan: no metastatic disease. Degenerative disease.  10/20/2017 Chest CXR  # PowerPort catheter noted with tip coiled in the superior vena cava, similar findings noted on prior exam. Persistent left upper lung infiltrate consistent with radiation changes. Similar findings noted on prior exams. # CT chest on 11/24/2017 Similar findings of radiation-induced consolidation in the medial left upper lobe.  No findings of recurrent or metastatic disease.  Left infrahilar node is similar and upper normal size. Looping of right side Port-A-Cath within the right internal jugular vein.  Aortic atherosclerosis.  Emphysema, coronary artery atherosclerosis. Esophageal air-fluid level suggest this mobility or gastroesophageal reflux  ASSESSMENT/PLAN Cancer Staging Squamous cell lung cancer, left ([place]) Staging form: Lung, AJCC 8th Edition - Clinical stage from 09/27/2016: Stage IIIA (cT2a, cN2, cM0) - Signed by  Earlie Server, MD on 09/29/2016  1. Squamous cell lung cancer, left ([place])   2. Port-A-Cath in place   3. Aortic atherosclerosis ([facility])    #Squamous cell lung cancer, stable disease.   He has finished concurrent chemo and radiation +26 cycles of durvalumab as maintenance. Doing well. CT chest with contrast was independently reviewed by me and discussed with patient.  Stable disease.  No new findings to suggest local recurrence  or metastatic disease in the thorax. Repeat CT in 3 months for surveillance.  #Aortic atherosclerosis in addition to three-vessel coronary artery disease.  Obtain a copy of his CT results and advised patient to follow-up with primary care physician to discuss about lifestyle, diet, and the risk reduction.  #Port-A-Cath in place, patient will need port flush every 6 weeks.  RTC: 3 months after CT scan. Orders Placed This Encounter  Procedures  . CT Chest Wo Contrast    Standing Status:   Future    Standing Expiration Date:   03/16/2019    Order Specific Question:   ** REASON FOR EXAM (FREE TEXT)    Answer:   Stage 3 lung cancer; post treatment surveillance    Order Specific Question:   Preferred imaging location?    Answer:   [facility]    Order Specific Question:   Radiology Contrast Protocol - do NOT remove file path    Answer:   [email or web address]     Earlie Server, MD, PhD Hematology Oncology [facility] Pager- [phone] 03/17/18

## 2018-03-26 ENCOUNTER — Ambulatory Visit: Payer: Medicare HMO | Admitting: Internal Medicine

## 2018-03-27 ENCOUNTER — Ambulatory Visit (INDEPENDENT_AMBULATORY_CARE_PROVIDER_SITE_OTHER): Payer: Medicare HMO | Admitting: Internal Medicine

## 2018-03-27 ENCOUNTER — Encounter: Payer: Self-pay | Admitting: Internal Medicine

## 2018-03-27 ENCOUNTER — Other Ambulatory Visit: Payer: Self-pay

## 2018-03-27 VITALS — BP 116/70 | HR 81 | Temp 98.8°F | Ht 71.0 in | Wt 251.0 lb

## 2018-03-27 DIAGNOSIS — I2510 Atherosclerotic heart disease of native coronary artery without angina pectoris: Secondary | ICD-10-CM

## 2018-03-27 DIAGNOSIS — C3492 Malignant neoplasm of unspecified part of left bronchus or lung: Secondary | ICD-10-CM | POA: Diagnosis not present

## 2018-03-27 DIAGNOSIS — J40 Bronchitis, not specified as acute or chronic: Secondary | ICD-10-CM | POA: Diagnosis not present

## 2018-03-27 DIAGNOSIS — I25118 Atherosclerotic heart disease of native coronary artery with other forms of angina pectoris: Secondary | ICD-10-CM | POA: Insufficient documentation

## 2018-03-27 DIAGNOSIS — R7303 Prediabetes: Secondary | ICD-10-CM | POA: Diagnosis not present

## 2018-03-27 MED ORDER — DOXYCYCLINE HYCLATE 100 MG PO TABS: 100.0000 mg | ORAL_TABLET | Freq: Two times a day (BID) | ORAL | 0 refills | Status: AC

## 2018-03-27 MED ORDER — ATORVASTATIN CALCIUM 10 MG PO TABS: 10.0000 mg | ORAL_TABLET | Freq: Every day | ORAL | 1 refills | Status: DC

## 2018-03-27 MED ORDER — GUAIFENESIN-CODEINE 100-10 MG/5ML PO SYRP: 5.0000 mL | ORAL_SOLUTION | Freq: Three times a day (TID) | ORAL | 0 refills | Status: DC | PRN

## 2018-03-27 NOTE — Progress Notes (Signed)
Date:  03/27/2018   Name:  [name]   DOB:  Feb 09, 1946   MRN:  [id number]   Chief Complaint: Cough (Sick 2 weeks Thursday. Mostly clear production. In chest. Some chills. No headaches. ) Pt here with complaint of congestion.  He is seeing Oncology regularly for treatment of lung cancer.  Recent CT showed 3 vessel CAD so discussion here was recommended.  He is not on lipid lowering medications. He is also due for PPV-23 and Prevar-13 but may have had these at the [facility].  Cough  This is a new problem. The current episode started 1 to 4 weeks ago. The problem has been unchanged. The problem occurs every few minutes. The cough is non-productive. Associated symptoms include chills. Pertinent negatives include no chest pain, fever, headaches, shortness of breath or wheezing. The symptoms are aggravated by cold air. He has tried nothing for the symptoms. His past medical history is significant for COPD (and recent lung cancer treatment). There is no history of environmental allergies.   CAD - pt does not think he ever had a cardiac stress test.  He has had EKGs at the [facility].  He also took red yeast rice and fish oil in the past and was told that his cholesterol was good.  When he started his lung cancer treatment, he was told to stop it.  H denies chest pain or SOB over baseline until the recent cough.  He continues to work as a Administrator.  CT from 03/08/18: Aortic atherosclerosis, in addition to three-vessel coronary artery disease. Assessment for potential risk factor modification, dietary therapy or pharmacologic therapy may be warranted, if clinically indicated.  Review of Systems  Constitutional: Positive for chills. Negative for diaphoresis, fatigue, fever and unexpected weight change.  HENT: Negative for trouble swallowing.   Respiratory: Positive for cough. Negative for chest tightness, shortness of breath and wheezing.   Cardiovascular: Negative for chest pain, palpitations and leg  swelling.  Gastrointestinal: Negative for abdominal pain.  Allergic/Immunologic: Negative for environmental allergies.  Neurological: Negative for dizziness and headaches.    Patient Active Problem List   Diagnosis Date Noted  . Coronary atherosclerosis of native coronary artery 03/27/2018  . Mixed hyperlipidemia 10/06/2017  . Allergic rhinitis 10/06/2017  . Encounter for antineoplastic immunotherapy 02/10/2017  . Prediabetes 01/25/2017  . BPH (benign prostatic hyperplasia) 10/21/2016  . Squamous cell lung cancer, left ([place]) 09/07/2016  . B12 deficiency 09/07/2016  . Vitamin D deficiency 09/07/2016  . Obesity (BMI 30-39.9) 09/07/2016  . Colon polyps 09/07/2016    No Known Allergies  Past Surgical History:  Procedure Laterality Date  . NO PAST SURGERIES    . PORTACATH PLACEMENT Right 10/05/2016   Procedure: INSERTION PORT-A-CATH;  Surgeon: [name], MD;  Location: [facility] ORS;  Service: General;  Laterality: Right;    Social History   Tobacco Use  . Smoking status: Former Smoker    Packs/day: 1.00    Years: 15.00    Pack years: 15.00    Types: Cigarettes    Last attempt to quit: 09/28/1992    Years since quitting: 25.5  . Smokeless tobacco: Never Used  Substance Use Topics  . Alcohol use: No  . Drug use: No     Medication list has been reviewed and updated.  Current Meds  Medication Sig  . acetaminophen (TYLENOL) 500 MG tablet Take 1,000 mg by mouth every 4 (four) hours as needed.   Marland Kitchen albuterol (PROVENTIL HFA;VENTOLIN HFA) 108 (90 Base)  MCG/ACT inhaler Inhale 2 puffs into the lungs every 6 (six) hours as needed for wheezing or shortness of breath.  . Cholecalciferol (VITAMIN D3) 2000 units capsule Take 1 capsule (2,000 Units total) by mouth daily.  . cyanocobalamin 500 MCG tablet Take 500 mcg by mouth daily.  . fluticasone (FLONASE) 50 MCG/ACT nasal spray Place 2 sprays into both nostrils daily. (Patient taking differently: Place 2 sprays into both nostrils  daily as needed. )  . fluticasone (FLOVENT HFA) 44 MCG/ACT inhaler Inhale 2 puffs into the lungs 2 (two) times daily.  Marland Kitchen lidocaine-prilocaine (EMLA) cream Apply 1 application topically as needed.  . mupirocin ointment (BACTROBAN) 2 % Apply 1 application topically 2 (two) times daily as needed.  Marland Kitchen omeprazole (PRILOSEC) 20 MG capsule Take 1 capsule (20 mg total) by mouth daily.  Marland Kitchen terazosin (HYTRIN) 1 MG capsule Take 1 capsule (1 mg total) by mouth at bedtime.    PHQ 2/9 Scores 03/27/2018 02/28/2018 05/26/2017 09/07/2016  PHQ - 2 Score 0 0 0 0    Physical Exam Vitals signs and nursing note reviewed.  Constitutional:      General: He is not in acute distress.    Appearance: Normal appearance. He is well-developed.  HENT:     Head: Normocephalic and atraumatic.  Neck:     Musculoskeletal: Normal range of motion and neck supple.     Vascular: No carotid bruit.  Cardiovascular:     Rate and Rhythm: Normal rate and regular rhythm.     Pulses: Normal pulses.  Pulmonary:     Effort: Pulmonary effort is normal. No respiratory distress.     Breath sounds: Examination of the left-middle field reveals decreased breath sounds. Decreased breath sounds present. No wheezing or rhonchi.  Musculoskeletal: Normal range of motion.  Lymphadenopathy:     Cervical: No cervical adenopathy.  Skin:    General: Skin is warm and dry.     Findings: No rash.  Neurological:     Mental Status: He is alert and oriented to person, place, and time.  Psychiatric:        Attention and Perception: Attention normal.        Mood and Affect: Mood normal.        Speech: Speech normal.        Behavior: Behavior normal.        Thought Content: Thought content normal.     BP 116/70   Pulse 81   Temp 98.8 F (37.1 C) (Oral)   Ht 5\' 11"  (1.803 m)   Wt 251 lb (113.9 kg)   SpO2 96%   BMI 35.01 kg/m   Assessment and Plan: 1. Bronchitis Use codeine syrup at night for sleep - do not take during the day if driving -  doxycycline (VIBRA-TABS) 100 MG tablet; Take 1 tablet (100 mg total) by mouth 2 (two) times daily for 10 days.  Dispense: 20 tablet; Refill: 0 - guaiFENesin-codeine (ROBITUSSIN AC) 100-10 MG/5ML syrup; Take 5 mLs by mouth 3 (three) times daily as needed for cough.  Dispense: 118 mL; Refill: 0  2. Atherosclerosis of native coronary artery of native heart without angina pectoris Recommend cardiology evaluation Begin lipitor when antibiotics complete - Ambulatory referral to Cardiology - atorvastatin (LIPITOR) 10 MG tablet; Take 1 tablet (10 mg total) by mouth daily.  Dispense: 90 tablet; Refill: 1  3. Squamous cell lung cancer, left ([place]) Completed chemotx and radiation as well as Durvalumab maintenance   4. Prediabetes stable   Partially  dictated using Editor, commissioning. Any errors are unintentional.  Halina Maidens, MD [facility]  03/27/2018

## 2018-03-29 DIAGNOSIS — I10 Essential (primary) hypertension: Secondary | ICD-10-CM | POA: Insufficient documentation

## 2018-04-06 ENCOUNTER — Inpatient Hospital Stay: Payer: Medicare HMO | Attending: Oncology

## 2018-04-06 DIAGNOSIS — Z452 Encounter for adjustment and management of vascular access device: Secondary | ICD-10-CM | POA: Insufficient documentation

## 2018-04-06 DIAGNOSIS — C3492 Malignant neoplasm of unspecified part of left bronchus or lung: Secondary | ICD-10-CM | POA: Insufficient documentation

## 2018-04-06 DIAGNOSIS — Z95828 Presence of other vascular implants and grafts: Secondary | ICD-10-CM

## 2018-04-06 MED ORDER — HEPARIN SOD (PORK) LOCK FLUSH 100 UNIT/ML IV SOLN
500.0000 [IU] | Freq: Once | INTRAVENOUS | Status: AC
  Administered 2018-04-06: 500 [IU] via INTRAVENOUS
  Filled 2018-04-06: qty 5

## 2018-04-06 MED ORDER — SODIUM CHLORIDE 0.9% FLUSH
10.0000 mL | Freq: Once | INTRAVENOUS | Status: AC
  Administered 2018-04-06: 10 mL via INTRAVENOUS
  Filled 2018-04-06: qty 10

## 2018-04-13 ENCOUNTER — Ambulatory Visit: Payer: Medicare HMO | Admitting: Internal Medicine

## 2018-05-25 ENCOUNTER — Inpatient Hospital Stay: Payer: Medicare HMO

## 2018-06-05 ENCOUNTER — Telehealth: Payer: Self-pay

## 2018-06-05 NOTE — Telephone Encounter (Signed)
Spoke with wife on the telephone and she states patient is working everyday and does not get home until 5:30 or 6:00.  Wife agreed I can mail him the SCP and Treatment summary and I will call him on 06/14/2018 at 8:30 to review SCP.  Informed wife APP will also talk to patient.

## 2018-06-08 ENCOUNTER — Ambulatory Visit: Payer: Medicare HMO | Admitting: Radiation Oncology

## 2018-06-12 ENCOUNTER — Other Ambulatory Visit: Payer: Self-pay | Admitting: *Deleted

## 2018-06-12 DIAGNOSIS — C3492 Malignant neoplasm of unspecified part of left bronchus or lung: Secondary | ICD-10-CM

## 2018-06-13 ENCOUNTER — Other Ambulatory Visit: Payer: Self-pay

## 2018-06-14 ENCOUNTER — Other Ambulatory Visit: Payer: Self-pay

## 2018-06-14 ENCOUNTER — Ambulatory Visit
Admission: RE | Admit: 2018-06-14 | Discharge: 2018-06-14 | Disposition: A | Discharge status: Home / Self Care | Payer: Medicare HMO | Source: Ambulatory Visit | Attending: Oncology | Admitting: Oncology

## 2018-06-14 ENCOUNTER — Inpatient Hospital Stay: Payer: Medicare HMO | Attending: Nurse Practitioner | Admitting: Nurse Practitioner

## 2018-06-14 DIAGNOSIS — C3492 Malignant neoplasm of unspecified part of left bronchus or lung: Secondary | ICD-10-CM | POA: Diagnosis not present

## 2018-06-14 DIAGNOSIS — Z9221 Personal history of antineoplastic chemotherapy: Secondary | ICD-10-CM | POA: Diagnosis not present

## 2018-06-14 DIAGNOSIS — Z923 Personal history of irradiation: Secondary | ICD-10-CM

## 2018-06-14 NOTE — Progress Notes (Signed)
[place] note [facility]  Telephone:([phone] [phone]  Virtual Visit via Telephone Note  Patient Care Team: Glean Hess, MD as PCP - General (Internal Medicine) Earlie Server, MD as Consulting Physician (Oncology)   Name of the patient: [name]  [id number]  09-06-1945   Date of visit: 06/14/18  CLINIC:  Survivorship  REASON FOR VISIT:  [place] visit & to address acute survivorship needs    I connected with Eddie North on 06/14/18 at 10:00 AM by telephone visit and verified that I am speaking with the correct person using two identifiers.    I discussed the limitations, risks, security and privacy concerns of performing an evaluation and management service by telemedicine and the availability of in-person appointments. I also discussed with the patient that there may be a patient responsible charge related to this service. The patient expressed understanding and agreed to proceed.   Other persons participating in the visit and their role in the encounter: no other persons on this call. He spoke to Magdalene Patricia, RN prior to my call for the purpose of this visit.   Patient's location: home  Provider's location: home  Chief Complaint: Lung Cancer Survivorship  Patient agreed to evaluation by telephone/telemedicine to discuss [place] [facility]    BRIEF ONCOLOGY HISTORY:  No history exists.   Celia Gibbons, initially presented a 73 year old African-American male who presented to urgent care for coughing and hemoptysis.  X-ray showed left lung mass.  CT showed left upper lobe mass with satellite lesion and mediastinal adenopathy.  PET showed hypermetabolic left upper lobe mass as well as the adjacent nodule, ipsilateral hilar, and mediastinal lymphadenopathy.  CT-guided left lung mass biopsy showed squamous lung carcinoma.  He is a former smoker.  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  Reported feeling well at diagnosis.  - Case discussed at case conference and consensus was that adjacent 7 mm nodule was not considered to be a septic nodule, therefore he is a CT2a/ Stage IIIA.  - MRI of brain was negative for metastases.  - He initiated concurrent chemotherapy (carbo-taxol -7 cycles - 10/21/2016-12/02/2016) managed by Dr. Tasia Catchings and radiation (10/13/2016 -12/02/2016) managed by Dr. Baruch Gouty followed by 26 cycles of maintenance durvalumab/Imfinzi completed on 03/15/2018.  - Surveillance imaging has shown stable area in left lung without evidence of local recurrence of disease or metastatic disease in thorax.   INTERVAL HISTORY: Wendel 'Bobby' Cuadros, 73 year old male with above history of stage IIIA lung cancer, presents to [facility] for initial meeting and review of his survivorship care plan. He says that he has recovered from hist treatments well and denies specific complaints beyond a chronic cough which is says is stable and unchanged for many months. He previously experienced some fatigue but feels this has improved. He continues to work as a Administrator and says that he tolerated treatments well and did not have to stop work. He had surveillance imaging studies done today and questions results.    ADDITIONAL REVIEW OF SYSTEMS:  Review of Systems  Constitutional: Negative.  Negative for chills, fever, malaise/fatigue and weight loss.  HENT: Negative.  Negative for congestion and sore throat.   Eyes: Negative.   Respiratory: Positive for cough (chronic post treatment). Negative for hemoptysis, sputum production, shortness of breath and wheezing.   Cardiovascular: Negative.  Negative for chest pain, palpitations and orthopnea.  Gastrointestinal:  Negative for constipation, heartburn and nausea.  Genitourinary: Negative.  Negative for dysuria, flank pain and urgency.  Musculoskeletal: Negative.  Negative for falls, joint pain and  myalgias.  Skin: Negative.   Neurological: Negative.  Negative for dizziness, weakness and headaches.  Endo/Heme/Allergies: Negative.   Psychiatric/Behavioral: Negative.  Negative for depression and memory loss. The patient is not nervous/anxious and does not have insomnia.      PAST MEDICAL & SURGICAL HISTORY:  Past Medical History:  Diagnosis Date   Enlarged prostate    GERD (gastroesophageal reflux disease)    Squamous cell lung cancer, left ([place]) 08/2016   Past Surgical History:  Procedure Laterality Date   NO PAST SURGERIES     PORTACATH PLACEMENT Right 10/05/2016   Procedure: INSERTION PORT-A-CATH;  Surgeon: [name], MD;  Location: [facility] ORS;  Service: General;  Laterality: Right;    SOCIAL HISTORY:  Social History   Socioeconomic History   Marital status: Single    Spouse name: Not on file   Number of children: 2   Years of education: Not on file   Highest education level: 10th grade  Occupational History   Occupation: truck Geophysicist/field seismologist    Comment: full time  Scientist, product/process development strain: Not hard at all   Food insecurity:    Worry: Never true    Inability: Never true   Transportation needs:    Medical: No    Non-medical: No  Tobacco Use   Smoking status: Former Smoker    Packs/day: 1.00    Years: 15.00    Pack years: 15.00    Types: Cigarettes    Last attempt to quit: 09/28/1992    Years since quitting: 25.7   Smokeless tobacco: Never Used  Substance and Sexual Activity   Alcohol use: No   Drug use: No   Sexual activity: Not on file  Lifestyle   Physical activity:    Days per week: 5 days    Minutes per session: 20 min   Stress: Not at all  Relationships   Social connections:    Talks on phone: More than three times a week    Gets together: More than three times a week    Attends religious service: Never    Active member of club or organization: No    Attends meetings of clubs or organizations: Never     Relationship status: Divorced  Other Topics Concern   Not on file  Social History Narrative   Not on file     CURRENT MEDICATIONS:  Current Outpatient Medications on File Prior to Visit  Medication Sig Dispense Refill   acetaminophen (TYLENOL) 500 MG tablet Take 1,000 mg by mouth every 4 (four) hours as needed.      albuterol (PROVENTIL HFA;VENTOLIN HFA) 108 (90 Base) MCG/ACT inhaler Inhale 2 puffs into the lungs every 6 (six) hours as needed for wheezing or shortness of breath. 1 Inhaler 2   atorvastatin (LIPITOR) 10 MG tablet Take 1 tablet (10 mg total) by mouth daily. 90 tablet 1   Cholecalciferol (VITAMIN D3) 2000 units capsule Take 1 capsule (2,000 Units total) by mouth daily.     cyanocobalamin 500 MCG tablet Take 500 mcg by mouth daily.     fluticasone (FLONASE) 50 MCG/ACT nasal spray Place 2 sprays into both nostrils daily. (Patient taking differently: Place 2 sprays into both nostrils daily as needed. ) 16 g 6   fluticasone (FLOVENT HFA) 44 MCG/ACT inhaler Inhale 2 puffs into the  lungs 2 (two) times daily. 1 Inhaler 12   guaiFENesin-codeine (ROBITUSSIN AC) 100-10 MG/5ML syrup Take 5 mLs by mouth 3 (three) times daily as needed for cough. 118 mL 0   lidocaine-prilocaine (EMLA) cream Apply 1 application topically as needed. 30 g 3   mupirocin ointment (BACTROBAN) 2 % Apply 1 application topically 2 (two) times daily as needed. 22 g 2   omeprazole (PRILOSEC) 20 MG capsule Take 1 capsule (20 mg total) by mouth daily. 30 capsule 5   terazosin (HYTRIN) 1 MG capsule Take 1 capsule (1 mg total) by mouth at bedtime.     [DISCONTINUED] prochlorperazine (COMPAZINE) 10 MG tablet Take 1 tablet (10 mg total) by mouth every 6 (six) hours as needed (Nausea or vomiting). 30 tablet 1   No current facility-administered medications on file prior to visit.     ALLERGIES: No Known Allergies   PHYSICAL EXAM:  Deferred due to telephone visit  LABORATORY DATA:  None for this visit.     DIAGNOSTIC IMAGING:  CT Chest WO Contrast- 06/13/2017 independently reviewed which showed overall stable    ASSESSMENT & PLAN:  Mr. Slager is a pleasant 73 y.o. male with history of Stage IIIA, treated with concurrent chemo-radiation and 26 cycles of durvalumab for maintenance completed on 03/15/2018. He presents to survivorship clinic today for survivorship care plan visit and to address any acute survivorship concerns since completing treatment.   1. Lung Cancer Survivorship/ Squamous Cell Carcinoma of Left Lung- stage IIIA: Today, he received a copy of his Survivorship Care Plan (SCP) document, which was reviewed with him in detail.  The SCP details his cancer treatment history and potential late/long-term side effects of those treatments.  We discussed the follow-up schedule he can anticipate with interval imaging for surveillance of his cancer.  I have also shared a copy of his treatment summary/SCP with his PCP.  We discussed the NCCN guidelines regarding cancer surveillance recommendations for his specific type of cancer and staging including imaging recommendations and symptoms that would be concerning for progression/recurrence.   2. Problem at visit: chronic cough- we discussed that this is likely a side effect of his treatments. It is stable and unchanged for ~1 year. We discussed respiratory physical therapy type exercises that he could do at home to help. I encouraged him to abstain from smoking or vaping and improve indoor air quality. I encouraged him to exercise as tolerated to build pulmonary function and discussed CARE program.   3. Smoking cessation: I commended Mr. Hardcastle continued efforts to remain tobacco-free.  We discussed that one of the most important risk reduction strategies in preventing cancer recurrence in lung cancer patients is smoking cessation.  He is committed to abstaining from tobacco.    4. Physical activity/Healthy eating: Getting adequate physical activity  and maintaining a healthy diet as a cancer survivor is important for overall wellness and reduces the risk of cancer recurrence. We discussed the [facility], which is a fitness program that is offered to cancer survivors free of charge.  We also reviewed "The Nutrition Rainbow" handout, as well as the American Cancer Society's booklet with recommendations for nutrition and physical activity.    5. Health promotion/Cancer screening:  Mr. Go is reportedly up-to-date on his colonoscopy, pap smear, PSA tests, skin screenings, and vaccinations.  I encouraged him to talk with his PCP about arranging appropriate cancer screening tests, as appropriate.   6. Support services/Counseling: It is not uncommon for this period of the patient's  cancer care trajectory to be one of many emotions and stressors.  Mr. Delima  was encouraged to take advantage of our many support services programs, support groups, and/or counseling in coping with her new life as a cancer survivor after completing anti-cancer treatment. He was given a calendar of the cancer center's support services events, as well as brochures for our spiritual care and free counseling resources.    7. Advanced Care Planning: I introduced the Palliative Care clinic at the [facility] and discussed the importance of advanced care planning for all cancer survivors. He has not yet completed advanced directives and is interested in doing so. He will contact the [place] for appointment with the [facility] for advanced care planning after the covid-19 precautions are lifted.   Dispo:  -continue to follow up with Dr. Tasia Catchings & Dr. Baruch Gouty for surveillance every 3-6 months for the first 3 years with chest ct, then history/physical & chest ct every 6 months for 2 years then history/physical and low dose non-contrast ct annually thereafter.  - Copy of this note and Survivorship Care Plan will be sent to Dr. Halina Maidens, PCP.   I  discussed the assessment and treatment plan with the patient. The patient was provided an opportunity to ask questions and all were answered. The patient agreed with the plan and demonstrated an understanding of the instructions.   The patient was advised to call back or seek an in-person evaluation if the symptoms worsen or if the condition fails to improve as anticipated.   I provided 12 minutes of non face-to-face telephone visit time during this encounter, and > 50% was spent counseling as documented under my assessment & plan.   Beckey Rutter, DNP, AGNP-C [place] [place] (work cell) [phone] (office)  CC: Dr. Tasia Catchings

## 2018-06-14 NOTE — Progress Notes (Signed)
Survivorship Care Plan visit completed.  Treatment summary reviewed via telephone and previously mailed to patient.  ASCO answers booklet reviewed and mailed to patient.  CARE program and Cancer Transitions discussed with patient along with other resources cancer center offers to patients and caregivers.  Patient verbalized understanding.

## 2018-06-18 ENCOUNTER — Ambulatory Visit: Payer: Medicare HMO | Admitting: Oncology

## 2018-06-21 ENCOUNTER — Telehealth: Payer: Self-pay | Admitting: Oncology

## 2018-06-22 ENCOUNTER — Other Ambulatory Visit: Payer: Self-pay

## 2018-06-22 ENCOUNTER — Inpatient Hospital Stay: Payer: Medicare HMO | Attending: Oncology | Admitting: Oncology

## 2018-06-22 ENCOUNTER — Encounter: Payer: Self-pay | Admitting: Radiation Oncology

## 2018-06-22 ENCOUNTER — Ambulatory Visit
Admission: RE | Admit: 2018-06-22 | Discharge: 2018-06-22 | Disposition: A | Discharge status: Home / Self Care | Payer: Medicare HMO | Source: Ambulatory Visit | Attending: Radiation Oncology | Admitting: Radiation Oncology

## 2018-06-22 ENCOUNTER — Encounter: Payer: Self-pay | Admitting: Oncology

## 2018-06-22 DIAGNOSIS — Z95828 Presence of other vascular implants and grafts: Secondary | ICD-10-CM

## 2018-06-22 DIAGNOSIS — Z87891 Personal history of nicotine dependence: Secondary | ICD-10-CM

## 2018-06-22 DIAGNOSIS — C3492 Malignant neoplasm of unspecified part of left bronchus or lung: Secondary | ICD-10-CM | POA: Insufficient documentation

## 2018-06-22 DIAGNOSIS — Z79899 Other long term (current) drug therapy: Secondary | ICD-10-CM

## 2018-06-22 DIAGNOSIS — Z452 Encounter for adjustment and management of vascular access device: Secondary | ICD-10-CM | POA: Insufficient documentation

## 2018-06-22 DIAGNOSIS — C3412 Malignant neoplasm of upper lobe, left bronchus or lung: Secondary | ICD-10-CM | POA: Diagnosis not present

## 2018-06-22 NOTE — Progress Notes (Signed)
Patient contacted for doximity visit. No concerns voiced.

## 2018-06-22 NOTE — Progress Notes (Signed)
Radiation Oncology Follow up Note  Name: [name]   Date:   06/22/2018 MRN:  [id number] DOB: 1945/12/04   TELEHEALTHNOTEINTRO Radiation Oncology TeleHEALTH VISIT PROGRESS NOTE  I connected with  Mr Quaranta   by telephone-Webex and verified that I am speaking with the correct person using two identifiers.  I discussed the limitations, risks, security and privacy concerns of performing an evaluation and management service by telemedicine and the availability of in-person appointments. I also discussed with the patient that there may be a patient responsible charge related to this service. The patient expressed understanding and agreed to proceed.    Other persons participating in the visit and their role in the encounter: none   Patient's location: Home      This 73 y.o. male presents to the clinic today for 71-month follow-up status post concurrent chemoradiation therapy for stage III a squamous cell carcinoma of the left lung.  REFERRING PROVIDER: Glean Hess, MD  HPI: Patient is a 73 year old male now at 14 months having completed concurrent chemoradiation for stage III a squamous cell carcinoma of the left lung seen today by video conferencing he is doing well specifically denies cough hemoptysis or chest tightness.Marland Kitchen  He recently had a CT scan showing stable masslike peripheral fibrosis in the upper left lung no findings to suggest local tumor recurrence.  Patient is currently on immunotherapy with Imfinzi and tolerated that well.  COMPLICATIONS OF TREATMENT: none  FOLLOW UP COMPLIANCE: keeps appointments   PHYSICAL EXAM:  There were no vitals taken for this visit. Patient was not examined since this was a video conference  RADIOLOGY RESULTS: CT scans reviewed and compatible with above-stated findings  PLAN: Present time patient is doing well with stable findings on CT scan and no significant side effects or complaints.  I am pleased with his overall progress.  Patient  continues on maintenance Imfinzi.  He has a CT scan scheduled for August.  I have asked to see him back in 6 months for follow-up.  He continues close follow-up care and treatment with medical oncology.  Patient knows to call at anytime with any complaints.  Total time of 30 minutes was spent reviewing records CT scans and person-to-person with patient.  VIRTUALVISITFACETOFACEORNONFACETOFACE     Noreene Filbert, MD

## 2018-06-23 NOTE — Progress Notes (Signed)
HEMATOLOGY-ONCOLOGY TeleHEALTH VISIT PROGRESS NOTE  I connected with [name] on 06/23/18 at  9:30 AM EDT by video enabled telemedicine visit and verified that I am speaking with the correct person using two identifiers. I discussed the limitations, risks, security and privacy concerns of performing an evaluation and management service by telemedicine and the availability of in-person appointments. I also discussed with the patient that there may be a patient responsible charge related to this service. The patient expressed understanding and agreed to proceed.   Other persons participating in the visit and their role in the encounter:   Janeann Merl, RN, check in patient.   Patient's location: Home  Provider's location: Work Risk analyst Complaint:  Follow up for stage III lung cancer, discuss image result.    INTERVAL HISTORY [name] is a 73 y.o. male who has above history reviewed by me today presents for follow up visit for management of stage III lung cancer, and discuss image result.  Problems and complaints are listed below:  He reports feeling well today. Denies any fever, chills, SOB, chest pain, abdominal pain.  He had surveillance CT done on 06/14/2018.  No new complaint.  Review of Systems  Constitutional: Negative for appetite change, chills, fatigue, fever and unexpected weight change.  HENT:   Negative for hearing loss and voice change.   Eyes: Negative for eye problems and icterus.  Respiratory: Negative for chest tightness, cough and shortness of breath.   Cardiovascular: Negative for chest pain and leg swelling.  Gastrointestinal: Negative for abdominal distention and abdominal pain.  Endocrine: Negative for hot flashes.  Genitourinary: Negative for difficulty urinating, dysuria and frequency.   Musculoskeletal: Negative for arthralgias.  Skin: Negative for itching and rash.  Neurological: Negative for light-headedness and numbness.  Hematological: Negative  for adenopathy. Does not bruise/bleed easily.  Psychiatric/Behavioral: Negative for confusion.    Past Medical History:  Diagnosis Date  . Enlarged prostate   . GERD (gastroesophageal reflux disease)   . Squamous cell lung cancer, left ([place]) 08/2016   Past Surgical History:  Procedure Laterality Date  . NO PAST SURGERIES    . PORTACATH PLACEMENT Right 10/05/2016   Procedure: INSERTION PORT-A-CATH;  Surgeon: [name], MD;  Location: [facility] ORS;  Service: General;  Laterality: Right;    Family History  Problem Relation Age of Onset  . Lymphoma Mother   . Diabetes Mother   . Lung cancer Sister   . Lung cancer Brother   . Lung cancer Brother     Social History   Socioeconomic History  . Marital status: Single    Spouse name: Not on file  . Number of children: 2  . Years of education: Not on file  . Highest education level: 10th grade  Occupational History  . Occupation: truck Geophysicist/field seismologist    Comment: full time  Social Needs  . Financial resource strain: Not hard at all  . Food insecurity:    Worry: Never true    Inability: Never true  . Transportation needs:    Medical: No    Non-medical: No  Tobacco Use  . Smoking status: Former Smoker    Packs/day: 1.00    Years: 15.00    Pack years: 15.00    Types: Cigarettes    Last attempt to quit: 09/28/1992    Years since quitting: 25.7  . Smokeless tobacco: Never Used  Substance and Sexual Activity  . Alcohol use: No  . Drug use: No  . Sexual activity: Not  on file  Lifestyle  . Physical activity:    Days per week: 5 days    Minutes per session: 20 min  . Stress: Not at all  Relationships  . Social connections:    Talks on phone: More than three times a week    Gets together: More than three times a week    Attends religious service: Never    Active member of club or organization: No    Attends meetings of clubs or organizations: Never    Relationship status: Divorced  . Intimate partner violence:    Fear of  current or ex partner: No    Emotionally abused: No    Physically abused: No    Forced sexual activity: No  Other Topics Concern  . Not on file  Social History Narrative  . Not on file    Current Outpatient Medications on File Prior to Visit  Medication Sig Dispense Refill  . acetaminophen (TYLENOL) 500 MG tablet Take 1,000 mg by mouth every 4 (four) hours as needed.     Marland Kitchen albuterol (PROVENTIL HFA;VENTOLIN HFA) 108 (90 Base) MCG/ACT inhaler Inhale 2 puffs into the lungs every 6 (six) hours as needed for wheezing or shortness of breath. 1 Inhaler 2  . atorvastatin (LIPITOR) 10 MG tablet Take 1 tablet (10 mg total) by mouth daily. 90 tablet 1  . Cholecalciferol (VITAMIN D3) 2000 units capsule Take 1 capsule (2,000 Units total) by mouth daily.    . cyanocobalamin 500 MCG tablet Take 500 mcg by mouth daily.    . fluticasone (FLONASE) 50 MCG/ACT nasal spray Place 2 sprays into both nostrils daily. (Patient taking differently: Place 2 sprays into both nostrils daily as needed. ) 16 g 6  . fluticasone (FLOVENT HFA) 44 MCG/ACT inhaler Inhale 2 puffs into the lungs 2 (two) times daily. 1 Inhaler 12  . guaiFENesin-codeine (ROBITUSSIN AC) 100-10 MG/5ML syrup Take 5 mLs by mouth 3 (three) times daily as needed for cough. 118 mL 0  . lidocaine-prilocaine (EMLA) cream Apply 1 application topically as needed. 30 g 3  . mupirocin ointment (BACTROBAN) 2 % Apply 1 application topically 2 (two) times daily as needed. 22 g 2  . omeprazole (PRILOSEC) 20 MG capsule Take 1 capsule (20 mg total) by mouth daily. 30 capsule 5  . terazosin (HYTRIN) 1 MG capsule Take 1 capsule (1 mg total) by mouth at bedtime.    . [DISCONTINUED] prochlorperazine (COMPAZINE) 10 MG tablet Take 1 tablet (10 mg total) by mouth every 6 (six) hours as needed (Nausea or vomiting). 30 tablet 1   No current facility-administered medications on file prior to visit.     No Known Allergies     Observations/Objective: There were no vitals  filed for this visit. There is no height or weight on file to calculate BMI.  Physical Exam  Constitutional: He is oriented to person, place, and time. No distress.  HENT:  Head: Normocephalic and atraumatic.  Pulmonary/Chest: Effort normal.  Neurological: He is alert and oriented to person, place, and time.  Psychiatric: Affect normal.    CBC    Component Value Date/Time   WBC 5.6 03/16/2018 0939   RBC 4.72 03/16/2018 0939   HGB 13.1 03/16/2018 0939   HCT 41.5 03/16/2018 0939   PLT 151 03/16/2018 0939   MCV 87.9 03/16/2018 0939   MCH 27.8 03/16/2018 0939   MCHC 31.6 03/16/2018 0939   RDW 13.3 03/16/2018 0939   LYMPHSABS 0.6 (L) 03/16/2018 4742  MONOABS 0.6 03/16/2018 0939   EOSABS 0.1 03/16/2018 0939   BASOSABS 0.0 03/16/2018 0939    CMP     Component Value Date/Time   NA 138 03/16/2018 0939   NA 136 09/07/2016 1143   K 3.9 03/16/2018 0939   CL 107 03/16/2018 0939   CO2 24 03/16/2018 0939   GLUCOSE 107 (H) 03/16/2018 0939   BUN 16 03/16/2018 0939   BUN 12 09/07/2016 1143   CREATININE 0.97 03/16/2018 0939   CALCIUM 9.0 03/16/2018 0939   PROT 7.7 03/16/2018 0939   PROT 7.5 09/07/2016 1143   ALBUMIN 4.0 03/16/2018 0939   ALBUMIN 4.0 09/07/2016 1143   AST 20 03/16/2018 0939   ALT 19 03/16/2018 0939   ALKPHOS 71 03/16/2018 0939   BILITOT 1.5 (H) 03/16/2018 0939   BILITOT 1.1 09/07/2016 1143   GFRNONAA >60 03/16/2018 0939   GFRAA >60 03/16/2018 0939     RADIOGRAPHIC STUDIES: I have personally reviewed the radiological images as listed and agreed with the findings in the report. Ct Chest Wo Contrast  Result Date: 06/14/2018 CLINICAL DATA:  Stage IIIA left upper lobe squamous cell lung cancer diagnosed July 2018 status post concurrent chemotherapy and radiation therapy. Ongoing immunotherapy. Restaging. EXAM: CT CHEST WITHOUT CONTRAST TECHNIQUE: Multidetector CT imaging of the chest was performed following the standard protocol without IV contrast. COMPARISON:   03/08/2018 chest CT. FINDINGS: Cardiovascular: Normal heart size. No significant pericardial effusion/thickening. Three-vessel coronary atherosclerosis. Right internal jugular Port-A-Cath coils in right brachiocephalic vein and terminates in the upper third of the SVC, unchanged. Atherosclerotic nonaneurysmal thoracic aorta. Normal caliber pulmonary arteries. Mediastinum/Nodes: No discrete thyroid nodules. Unremarkable esophagus. No axillary adenopathy. Mildly enlarged 1.3 cm right hilar node (series 2/image 67) is unchanged since 08/31/2016 chest CT and was non hypermetabolic on 98/33/8250 PET-CT, most compatible with reactive node. No pathologically enlarged mediastinal nodes. No new discrete hilar adenopathy on this noncontrast scan. Lungs/Pleura: No pneumothorax. No pleural effusion. Moderate centrilobular and paraseptal emphysema. Right middle lobe solid 7 mm pulmonary nodule along the minor fissure (series 3/image 78) has been stable since 08/31/2016 chest CT, considered benign. A few tiny scattered subsolid pulmonary nodules in the lower lobes bilaterally, largest 5 mm in the peripheral right lower lobe (series 3/image 103), all unchanged in the interval and on multiple consecutive chest CT studies back to at least 09/07/2017, more likely benign. Masslike fibrosis in the parahilar upper left lung with associated bronchiectasis, volume loss and distortion, unchanged. No acute consolidative airspace disease or new significant pulmonary nodules. Upper abdomen: Simple 1.9 cm right liver dome cyst. Simple 2.5 cm upper left renal cyst. Musculoskeletal: No aggressive appearing focal osseous lesions. Moderate thoracic spondylosis. IMPRESSION: 1. Stable masslike perihilar fibrosis in the upper left lung. No findings of local tumor recurrence. 2. No findings suspicious for recurrent metastatic disease in the chest. Aortic Atherosclerosis (ICD10-I70.0) and Emphysema (ICD10-J43.9). Electronically Signed   By: Ilona Sorrel M.D.   On: 06/14/2018 09:06     Assessment and Plan: 1. Squamous cell lung cancer, left ([place])   2. Port-A-Cath in place   Labs are reviewed and discussed with patient.  Stable counts. CT chest image was independently reviewed by me and discussed with patient. Stable masslike perihilar fibrosis in the upper left lung.  No findings of local tumor recurrence.  No findings suspicious for recurrent or metastatic disease in the chest.  Recommend patient to continue surveillance CT every 3 months. Port-A-Cath in place.  Continue port flush every 6  weeks. Follow Up Instructions: Lab MD 3 months after CT scan.  Orders Placed This Encounter  Procedures  . CT Chest W Contrast    Standing Status:   Future    Standing Expiration Date:   06/22/2019    Order Specific Question:   If indicated for the ordered procedure, I authorize the administration of contrast media per Radiology protocol    Answer:   Yes    Order Specific Question:   Preferred imaging location?    Answer:   [facility]    Order Specific Question:   Radiology Contrast Protocol - do NOT remove file path    Answer:   [email or web address]     I discussed the assessment and treatment plan with the patient. The patient was provided an opportunity to ask questions and all were answered. The patient agreed with the plan and demonstrated an understanding of the instructions.  The patient was advised to call back or seek an in-person evaluation if the symptoms worsen or if the condition fails to improve as anticipated.   I provided 15 minutes of face-to-face video visit time during this encounter, and > 50% was spent counseling as documented under my assessment & plan.  Earlie Server, MD 06/23/2018 11:03 PM

## 2018-06-24 ENCOUNTER — Encounter: Payer: Self-pay | Admitting: Internal Medicine

## 2018-06-24 DIAGNOSIS — I700 Atherosclerosis of aorta: Secondary | ICD-10-CM | POA: Insufficient documentation

## 2018-06-27 ENCOUNTER — Other Ambulatory Visit: Payer: Self-pay

## 2018-06-27 ENCOUNTER — Inpatient Hospital Stay: Payer: Medicare HMO

## 2018-06-27 DIAGNOSIS — Z95828 Presence of other vascular implants and grafts: Secondary | ICD-10-CM

## 2018-06-27 DIAGNOSIS — Z452 Encounter for adjustment and management of vascular access device: Secondary | ICD-10-CM | POA: Diagnosis not present

## 2018-06-27 DIAGNOSIS — C3492 Malignant neoplasm of unspecified part of left bronchus or lung: Secondary | ICD-10-CM | POA: Diagnosis not present

## 2018-06-27 MED ORDER — SODIUM CHLORIDE 0.9% FLUSH
10.0000 mL | Freq: Once | INTRAVENOUS | Status: AC
  Administered 2018-06-27: 10 mL via INTRAVENOUS
  Filled 2018-06-27: qty 10

## 2018-06-27 MED ORDER — HEPARIN SOD (PORK) LOCK FLUSH 100 UNIT/ML IV SOLN
500.0000 [IU] | Freq: Once | INTRAVENOUS | Status: AC
  Administered 2018-06-27: 500 [IU] via INTRAVENOUS

## 2018-07-13 ENCOUNTER — Encounter: Payer: Self-pay | Admitting: Internal Medicine

## 2018-07-13 ENCOUNTER — Ambulatory Visit (INDEPENDENT_AMBULATORY_CARE_PROVIDER_SITE_OTHER): Payer: Medicare HMO | Admitting: Internal Medicine

## 2018-07-13 ENCOUNTER — Other Ambulatory Visit: Payer: Self-pay

## 2018-07-13 VITALS — BP 128/80 | HR 71 | Ht 71.0 in | Wt 259.0 lb

## 2018-07-13 DIAGNOSIS — Z23 Encounter for immunization: Secondary | ICD-10-CM | POA: Diagnosis not present

## 2018-07-13 DIAGNOSIS — R7303 Prediabetes: Secondary | ICD-10-CM

## 2018-07-13 DIAGNOSIS — E782 Mixed hyperlipidemia: Secondary | ICD-10-CM

## 2018-07-13 DIAGNOSIS — I25118 Atherosclerotic heart disease of native coronary artery with other forms of angina pectoris: Secondary | ICD-10-CM

## 2018-07-13 NOTE — Progress Notes (Signed)
Date:  07/13/2018   Name:  [name]   DOB:  1945/06/09   MRN:  [id number]   Chief Complaint: Pre- dm (6 month follow up. ) and Hyperlipidemia  Diabetes  He presents for his follow-up diabetic visit. Diabetes type: prediabetes. Pertinent negatives for hypoglycemia include no dizziness, headaches or tremors. Pertinent negatives for diabetes include no chest pain, no fatigue and no polyuria. Current diabetic treatment includes diet. His weight is stable. He is following a generally healthy diet.  Hyperlipidemia  This is a new problem. Pertinent negatives include no chest pain or shortness of breath. Current antihyperlipidemic treatment includes statins. Risk factors: CT showed coronary atherosclerosis.  CAD - seen by Cardiology - stable ECHO and stress test.  Recommended monitoring BP and continuing lipitor. He walks a mile several times a week and does not have chest pain or dizziness.  Mild SOB from lung cancer treatment.  Lab Results  Component Value Date   HGBA1C 5.7 (H) 10/06/2017   Lab Results  Component Value Date   CREATININE 0.97 03/16/2018   BUN 16 03/16/2018   NA 138 03/16/2018   K 3.9 03/16/2018   CL 107 03/16/2018   CO2 24 03/16/2018   Lab Results  Component Value Date   CHOL 181 10/06/2017   HDL 41 10/06/2017   LDLCALC 126 (H) 10/06/2017   TRIG 70 10/06/2017   CHOLHDL 4.4 10/06/2017      Review of Systems  Constitutional: Negative for appetite change, fatigue and unexpected weight change.  Eyes: Negative for visual disturbance.  Respiratory: Negative for cough, shortness of breath and wheezing.   Cardiovascular: Negative for chest pain, palpitations and leg swelling.  Gastrointestinal: Negative for abdominal pain and blood in stool.  Endocrine: Negative for polyuria.  Genitourinary: Negative for dysuria, hematuria and urgency.  Skin: Negative for color change and rash.  Allergic/Immunologic: Negative for environmental allergies.  Neurological:  Negative for dizziness, tremors, numbness and headaches.  Psychiatric/Behavioral: Negative for dysphoric mood.    Patient Active Problem List   Diagnosis Date Noted  . Atherosclerosis of aorta ([place]) 06/24/2018  . Benign essential HTN 03/29/2018  . Coronary artery disease of native artery of native heart with stable angina pectoris ([facility]) 03/27/2018  . Mixed hyperlipidemia 10/06/2017  . Allergic rhinitis 10/06/2017  . Encounter for antineoplastic immunotherapy 02/10/2017  . Prediabetes 01/25/2017  . BPH (benign prostatic hyperplasia) 10/21/2016  . Squamous cell lung cancer, left ([place]) 09/07/2016  . B12 deficiency 09/07/2016  . Vitamin D deficiency 09/07/2016  . Obesity (BMI 30-39.9) 09/07/2016  . Colon polyps 09/07/2016    No Known Allergies  Past Surgical History:  Procedure Laterality Date  . NO PAST SURGERIES    . PORTACATH PLACEMENT Right 10/05/2016   Procedure: INSERTION PORT-A-CATH;  Surgeon: [name], MD;  Location: [facility] ORS;  Service: General;  Laterality: Right;    Social History   Tobacco Use  . Smoking status: Former Smoker    Packs/day: 1.00    Years: 15.00    Pack years: 15.00    Types: Cigarettes    Last attempt to quit: 09/28/1992    Years since quitting: 25.8  . Smokeless tobacco: Never Used  Substance Use Topics  . Alcohol use: No  . Drug use: No     Medication list has been reviewed and updated.  Current Meds  Medication Sig  . acetaminophen (TYLENOL) 500 MG tablet Take 1,000 mg by mouth every 4 (four) hours as needed.   Marland Kitchen albuterol (  PROVENTIL HFA;VENTOLIN HFA) 108 (90 Base) MCG/ACT inhaler Inhale 2 puffs into the lungs every 6 (six) hours as needed for wheezing or shortness of breath.  Marland Kitchen atorvastatin (LIPITOR) 10 MG tablet Take 1 tablet (10 mg total) by mouth daily.  . Cholecalciferol (VITAMIN D3) 2000 units capsule Take 1 capsule (2,000 Units total) by mouth daily.  . cyanocobalamin 500 MCG tablet Take 500 mcg by mouth daily.  .  fluticasone (FLONASE) 50 MCG/ACT nasal spray Place 2 sprays into both nostrils daily. (Patient taking differently: Place 2 sprays into both nostrils daily as needed. )  . fluticasone (FLOVENT HFA) 44 MCG/ACT inhaler Inhale 2 puffs into the lungs 2 (two) times daily.  Marland Kitchen guaiFENesin-codeine (ROBITUSSIN AC) 100-10 MG/5ML syrup Take 5 mLs by mouth 3 (three) times daily as needed for cough.  . lidocaine-prilocaine (EMLA) cream Apply 1 application topically as needed.  . mupirocin ointment (BACTROBAN) 2 % Apply 1 application topically 2 (two) times daily as needed.  Marland Kitchen omeprazole (PRILOSEC) 20 MG capsule Take 1 capsule (20 mg total) by mouth daily.  Marland Kitchen terazosin (HYTRIN) 1 MG capsule Take 1 capsule (1 mg total) by mouth at bedtime.    PHQ 2/9 Scores 07/13/2018 03/27/2018 02/28/2018 05/26/2017  PHQ - 2 Score 0 0 0 0    BP Readings from Last 3 Encounters:  07/13/18 128/80  03/27/18 116/70  03/16/18 (!) 143/89    Physical Exam Vitals signs and nursing note reviewed.  Constitutional:      General: He is not in acute distress.    Appearance: Normal appearance. He is well-developed.  HENT:     Head: Normocephalic and atraumatic.  Eyes:     Pupils: Pupils are equal, round, and reactive to light.  Neck:     Musculoskeletal: Normal range of motion and neck supple.  Cardiovascular:     Rate and Rhythm: Normal rate and regular rhythm.     Pulses: Normal pulses.  Pulmonary:     Effort: Pulmonary effort is normal. No respiratory distress.     Breath sounds: No wheezing or rhonchi.  Musculoskeletal: Normal range of motion.     Right lower leg: No edema.     Left lower leg: No edema.  Lymphadenopathy:     Cervical: No cervical adenopathy.  Skin:    General: Skin is warm and dry.     Findings: No rash.  Neurological:     Mental Status: He is alert and oriented to person, place, and time.  Psychiatric:        Behavior: Behavior normal.        Thought Content: Thought content normal.     Wt  Readings from Last 3 Encounters:  07/13/18 259 lb (117.5 kg)  03/27/18 251 lb (113.9 kg)  03/16/18 255 lb 2 oz (115.7 kg)    BP 128/80   Pulse 71   Ht 5\' 11"  (1.803 m)   Wt 259 lb (117.5 kg)   SpO2 96%   BMI 36.12 kg/m   Assessment and Plan: 1. Prediabetes Continue healthy diet, avoid further weight gain - Hemoglobin A1c  2. Mixed hyperlipidemia On statin therapy without side efeects - Comprehensive metabolic panel - Lipid panel  3. Need for vaccination for pneumococcus - Pneumococcal polysaccharide vaccine 23-valent greater than or equal to 2yo subcutaneous/IM  4. Coronary artery disease of native artery of native heart with stable angina pectoris [facility]) Seen by Cardiology - normal ECHO and Stress test Continue statin   Partially dictated using Editor, commissioning.  Any errors are unintentional.  Halina Maidens, MD [facility]  07/13/2018

## 2018-07-13 NOTE — Patient Instructions (Signed)

## 2018-07-14 LAB — COMPREHENSIVE METABOLIC PANEL
ALT: 22 IU/L (ref 0–44)
AST: 14 IU/L (ref 0–40)
Albumin/Globulin Ratio: 1.4 (ref 1.2–2.2)
Albumin: 4.3 g/dL (ref 3.7–4.7)
Alkaline Phosphatase: 71 IU/L (ref 39–117)
BUN/Creatinine Ratio: 13 (ref 10–24)
BUN: 15 mg/dL (ref 8–27)
Bilirubin Total: 0.8 mg/dL (ref 0.0–1.2)
CO2: 21 mmol/L (ref 20–29)
Calcium: 9.2 mg/dL (ref 8.6–10.2)
Chloride: 104 mmol/L (ref 96–106)
Creatinine, Ser: 1.18 mg/dL (ref 0.76–1.27)
GFR calc Af Amer: 71 mL/min/{1.73_m2} (ref >59)
GFR calc non Af Amer: 62 mL/min/{1.73_m2} (ref >59)
Globulin, Total: 3.1 g/dL (ref 1.5–4.5)
Glucose: 96 mg/dL (ref 65–99)
Potassium: 4.3 mmol/L (ref 3.5–5.2)
Sodium: 139 mmol/L (ref 134–144)
Total Protein: 7.4 g/dL (ref 6.0–8.5)

## 2018-07-14 LAB — LIPID PANEL
Chol/HDL Ratio: 3.5 ratio (ref 0.0–5.0)
Cholesterol, Total: 162 mg/dL (ref 100–199)
HDL: 46 mg/dL (ref >39)
LDL Calculated: 99 mg/dL (ref 0–99)
Triglycerides: 86 mg/dL (ref 0–149)
VLDL Cholesterol Cal: 17 mg/dL (ref 5–40)

## 2018-07-14 LAB — HEMOGLOBIN A1C
Est. average glucose Bld gHb Est-mCnc: 114 mg/dL
Hgb A1c MFr Bld: 5.6 % (ref 4.8–5.6)

## 2018-08-22 ENCOUNTER — Inpatient Hospital Stay: Payer: Medicare HMO

## 2018-08-23 ENCOUNTER — Other Ambulatory Visit: Payer: Self-pay

## 2018-08-24 ENCOUNTER — Other Ambulatory Visit: Payer: Self-pay

## 2018-08-24 ENCOUNTER — Inpatient Hospital Stay: Payer: Medicare HMO | Attending: Oncology

## 2018-08-24 DIAGNOSIS — C3492 Malignant neoplasm of unspecified part of left bronchus or lung: Secondary | ICD-10-CM | POA: Insufficient documentation

## 2018-08-24 DIAGNOSIS — Z95828 Presence of other vascular implants and grafts: Secondary | ICD-10-CM

## 2018-08-24 DIAGNOSIS — Z452 Encounter for adjustment and management of vascular access device: Secondary | ICD-10-CM | POA: Diagnosis not present

## 2018-08-24 MED ORDER — HEPARIN SOD (PORK) LOCK FLUSH 100 UNIT/ML IV SOLN
500.0000 [IU] | Freq: Once | INTRAVENOUS | Status: AC
  Administered 2018-08-24: 500 [IU] via INTRAVENOUS

## 2018-08-24 MED ORDER — SODIUM CHLORIDE 0.9% FLUSH
10.0000 mL | Freq: Once | INTRAVENOUS | Status: AC
  Administered 2018-08-24: 10 mL via INTRAVENOUS
  Filled 2018-08-24: qty 10

## 2018-09-20 ENCOUNTER — Other Ambulatory Visit
Admission: RE | Admit: 2018-09-20 | Discharge: 2018-09-20 | Disposition: A | Discharge status: Home / Self Care | Payer: Medicare HMO | Source: Home / Self Care | Attending: Oncology | Admitting: Oncology

## 2018-09-20 ENCOUNTER — Ambulatory Visit
Admission: RE | Admit: 2018-09-20 | Discharge: 2018-09-20 | Disposition: A | Discharge status: Home / Self Care | Payer: Medicare HMO | Source: Ambulatory Visit | Attending: Oncology | Admitting: Oncology

## 2018-09-20 ENCOUNTER — Other Ambulatory Visit: Payer: Self-pay

## 2018-09-20 ENCOUNTER — Other Ambulatory Visit: Payer: Medicare HMO

## 2018-09-20 DIAGNOSIS — C3492 Malignant neoplasm of unspecified part of left bronchus or lung: Secondary | ICD-10-CM | POA: Insufficient documentation

## 2018-09-20 DIAGNOSIS — C3490 Malignant neoplasm of unspecified part of unspecified bronchus or lung: Secondary | ICD-10-CM | POA: Diagnosis not present

## 2018-09-20 DIAGNOSIS — J181 Lobar pneumonia, unspecified organism: Secondary | ICD-10-CM | POA: Diagnosis not present

## 2018-09-20 LAB — CREATININE, SERUM
Creatinine, Ser: 1.06 mg/dL (ref 0.61–1.24)
GFR calc Af Amer: >60 mL/min (ref >60)
GFR calc non Af Amer: >60 mL/min (ref >60)

## 2018-09-20 MED ORDER — IOHEXOL 300 MG/ML  SOLN
75.0000 mL | Freq: Once | INTRAMUSCULAR | Status: AC | PRN
  Administered 2018-09-20: 75 mL via INTRAVENOUS

## 2018-09-21 ENCOUNTER — Ambulatory Visit: Payer: Medicare HMO

## 2018-09-21 ENCOUNTER — Other Ambulatory Visit: Payer: Self-pay | Admitting: Oncology

## 2018-09-21 DIAGNOSIS — C3492 Malignant neoplasm of unspecified part of left bronchus or lung: Secondary | ICD-10-CM

## 2018-09-24 ENCOUNTER — Inpatient Hospital Stay: Payer: Medicare HMO | Admitting: Oncology

## 2018-09-24 ENCOUNTER — Encounter: Payer: Self-pay | Admitting: Oncology

## 2018-09-24 ENCOUNTER — Other Ambulatory Visit: Payer: Self-pay

## 2018-09-24 ENCOUNTER — Inpatient Hospital Stay: Payer: Medicare HMO | Attending: Oncology | Admitting: *Deleted

## 2018-09-24 VITALS — BP 134/80 | HR 62 | Temp 95.0°F | Resp 18 | Wt 257.5 lb

## 2018-09-24 DIAGNOSIS — Z923 Personal history of irradiation: Secondary | ICD-10-CM | POA: Insufficient documentation

## 2018-09-24 DIAGNOSIS — C3412 Malignant neoplasm of upper lobe, left bronchus or lung: Secondary | ICD-10-CM | POA: Diagnosis not present

## 2018-09-24 DIAGNOSIS — R05 Cough: Secondary | ICD-10-CM | POA: Diagnosis not present

## 2018-09-24 DIAGNOSIS — C3492 Malignant neoplasm of unspecified part of left bronchus or lung: Secondary | ICD-10-CM

## 2018-09-24 DIAGNOSIS — Z9221 Personal history of antineoplastic chemotherapy: Secondary | ICD-10-CM | POA: Diagnosis not present

## 2018-09-24 DIAGNOSIS — Z79899 Other long term (current) drug therapy: Secondary | ICD-10-CM | POA: Insufficient documentation

## 2018-09-24 DIAGNOSIS — Z801 Family history of malignant neoplasm of trachea, bronchus and lung: Secondary | ICD-10-CM | POA: Insufficient documentation

## 2018-09-24 DIAGNOSIS — Z807 Family history of other malignant neoplasms of lymphoid, hematopoietic and related tissues: Secondary | ICD-10-CM | POA: Insufficient documentation

## 2018-09-24 DIAGNOSIS — J700 Acute pulmonary manifestations due to radiation: Secondary | ICD-10-CM | POA: Diagnosis not present

## 2018-09-24 DIAGNOSIS — Z833 Family history of diabetes mellitus: Secondary | ICD-10-CM | POA: Insufficient documentation

## 2018-09-24 DIAGNOSIS — Z87891 Personal history of nicotine dependence: Secondary | ICD-10-CM | POA: Insufficient documentation

## 2018-09-24 DIAGNOSIS — Z95828 Presence of other vascular implants and grafts: Secondary | ICD-10-CM

## 2018-09-24 LAB — CBC WITH DIFFERENTIAL/PLATELET
Abs Immature Granulocytes: 0.01 10*3/uL (ref 0.00–0.07)
Basophils Absolute: 0 10*3/uL (ref 0.0–0.1)
Basophils Relative: 0 %
Eosinophils Absolute: 0.1 10*3/uL (ref 0.0–0.5)
Eosinophils Relative: 2 %
HCT: 41.3 % (ref 39.0–52.0)
Hemoglobin: 13.2 g/dL (ref 13.0–17.0)
Immature Granulocytes: 0 %
Lymphocytes Relative: 26 %
Lymphs Abs: 1.3 10*3/uL (ref 0.7–4.0)
MCH: 27.8 pg (ref 26.0–34.0)
MCHC: 32 g/dL (ref 30.0–36.0)
MCV: 86.9 fL (ref 80.0–100.0)
Monocytes Absolute: 0.5 10*3/uL (ref 0.1–1.0)
Monocytes Relative: 9 %
Neutro Abs: 3.3 10*3/uL (ref 1.7–7.7)
Neutrophils Relative %: 63 %
Platelets: 151 10*3/uL (ref 150–400)
RBC: 4.75 MIL/uL (ref 4.22–5.81)
RDW: 13.5 % (ref 11.5–15.5)
WBC: 5.2 10*3/uL (ref 4.0–10.5)
nRBC: 0 % (ref 0.0–0.2)

## 2018-09-24 LAB — COMPREHENSIVE METABOLIC PANEL
ALT: 19 U/L (ref 0–44)
AST: 19 U/L (ref 15–41)
Albumin: 4.1 g/dL (ref 3.5–5.0)
Alkaline Phosphatase: 68 U/L (ref 38–126)
Anion gap: 7 (ref 5–15)
BUN: 18 mg/dL (ref 8–23)
CO2: 23 mmol/L (ref 22–32)
Calcium: 9.1 mg/dL (ref 8.9–10.3)
Chloride: 108 mmol/L (ref 98–111)
Creatinine, Ser: 1.13 mg/dL (ref 0.61–1.24)
GFR calc Af Amer: >60 mL/min (ref >60)
GFR calc non Af Amer: >60 mL/min (ref >60)
Glucose, Bld: 98 mg/dL (ref 70–99)
Potassium: 4.4 mmol/L (ref 3.5–5.1)
Sodium: 138 mmol/L (ref 135–145)
Total Bilirubin: 1.2 mg/dL (ref 0.3–1.2)
Total Protein: 8 g/dL (ref 6.5–8.1)

## 2018-09-24 LAB — TSH: TSH: 1.314 u[IU]/mL (ref 0.350–4.500)

## 2018-09-24 MED ORDER — HEPARIN SOD (PORK) LOCK FLUSH 100 UNIT/ML IV SOLN: 500.0000 [IU] | Freq: Once | INTRAVENOUS | Status: DC

## 2018-09-24 MED ORDER — SODIUM CHLORIDE 0.9% FLUSH
10.0000 mL | Freq: Once | INTRAVENOUS | Status: DC
  Filled 2018-09-24: qty 10

## 2018-09-24 NOTE — Progress Notes (Signed)
CT scan on 09/20/18 and patient does not offer any problems today.

## 2018-09-24 NOTE — Progress Notes (Signed)
[name]  Patient Care Team: Ryan Hess, MD as PCP - General (Internal Medicine) Ryan Server, MD as Consulting Physician (Oncology) Ryan Skains, MD as Consulting Physician (Cardiology) Ryan Filbert, MD as Referring Physician (Radiation Oncology)  REASON FOR [name]:  Follow up for evaluation prior to maintenance immunotherapy  HISTORY OF PRESENT ILLNESS/ PERTINENT ONCOLOGY HISTORY #  Stage IIIA lung squamous cell cancer. S/p concurrent chemotherapy and Radiation.[ completed Oct 2018], started on Durvalumab in Nov 2019.  06/09/17 CT chest. scan was presented on tumor board on 06/22/2017, consensus is stable disease, with postradiation changes. Monitor # Pathology: TPS 50%. 09/22/2016 left upper lobe biopsy showed squamous cell carcinoma. Foundation one test: ordered prior to patient first [name]. Showed NO EGFR, ROS1, ALK, BRAF, MET mutation.   Cancer Treatment: S/p concurrent carbo/taxol and Radiation. Finished Oct 2018. Currently Durvalumab Q2 weeks. Treatment was interrupted after cycle 5 due to radiation pneumonitis. After steroid treatment, back on Durvalumab Q2weeks.   INTERVAL HISTORY Today [name] presented for follow up for assessment prior to immunotherapy maintenance for treatment of lung squamous cell carcinoma stage IIIA Patient continues to do well. He has a chronic cough. Denies any shortness of breath. Continues to work as a Administrator. No new complaints today. He asked why labs cannot be drawn through the port.  I explained to him that if he is not coming for any treatment, port will not be accessed and labs usually will be drawn from peripheral veins unless he does not have IV access.   Review of Systems  Constitutional: Negative for appetite change, chills, fatigue, fever and unexpected weight change.  HENT:   Negative for hearing loss and voice change.   Eyes: Negative for eye problems and icterus.   Respiratory: Positive for cough. Negative for chest tightness and shortness of breath.   Cardiovascular: Negative for chest pain and leg swelling.  Gastrointestinal: Negative for abdominal distention and abdominal pain.  Endocrine: Negative for hot flashes.  Genitourinary: Negative for difficulty urinating, dysuria and frequency.   Musculoskeletal: Negative for arthralgias.  Skin: Negative for itching and rash.  Neurological: Negative for light-headedness and numbness.  Hematological: Negative for adenopathy. Does not bruise/bleed easily.  Psychiatric/Behavioral: Negative for confusion.    MEDICAL HISTORY: Past Medical History:  Diagnosis Date  . Enlarged prostate   . GERD (gastroesophageal reflux disease)   . Squamous cell lung cancer, left ([place]) 08/2016    SURGICAL HISTORY: Past Surgical History:  Procedure Laterality Date  . NO PAST SURGERIES    . PORTACATH PLACEMENT Right 10/05/2016   Procedure: INSERTION PORT-A-CATH;  Surgeon: Ryan Pert, MD;  Location: [facility] ORS;  Service: General;  Laterality: Right;    SOCIAL HISTORY: Social History   Socioeconomic History  . Marital status: Single    Spouse name: Not on file  . Number of children: 2  . Years of education: Not on file  . Highest education level: 10th grade  Occupational History  . Occupation: truck Geophysicist/field seismologist    Comment: full time  Social Needs  . Financial resource strain: Not hard at all  . Food insecurity    Worry: Never true    Inability: Never true  . Transportation needs    Medical: No    Non-medical: No  Tobacco Use  . Smoking status: Former Smoker    Packs/day: 1.00    Years: 15.00    Pack years: 15.00    Types: Cigarettes  Quit date: 09/28/1992    Years since quitting: 26.0  . Smokeless tobacco: Never Used  Substance and Sexual Activity  . Alcohol use: No  . Drug use: No  . Sexual activity: Not on file  Lifestyle  . Physical activity    Days per week: 5 days    Minutes per session:  20 min  . Stress: Not at all  Relationships  . Social connections    Talks on phone: More than three times a week    Gets together: More than three times a week    Attends religious service: Never    Active member of club or organization: No    Attends meetings of clubs or organizations: Never    Relationship status: Divorced  . Intimate partner violence    Fear of current or ex partner: No    Emotionally abused: No    Physically abused: No    Forced sexual activity: No  Other Topics Concern  . Not on file  Social History Narrative  . Not on file    FAMILY HISTORY Family History  Problem Relation Age of Onset  . Lymphoma Mother   . Diabetes Mother   . Lung cancer Sister   . Lung cancer Brother   . Lung cancer Brother     ALLERGIES:  has No Known Allergies.  MEDICATIONS:  Current Outpatient Medications  Medication Sig Dispense Refill  . acetaminophen (TYLENOL) 500 MG tablet Take 1,000 mg by mouth every 4 (four) hours as needed.     Marland Kitchen albuterol (PROVENTIL HFA;VENTOLIN HFA) 108 (90 Base) MCG/ACT inhaler Inhale 2 puffs into the lungs every 6 (six) hours as needed for wheezing or shortness of breath. 1 Inhaler 2  . atorvastatin (LIPITOR) 10 MG tablet Take 1 tablet (10 mg total) by mouth daily. 90 tablet 1  . Cholecalciferol (VITAMIN D3) 2000 units capsule Take 1 capsule (2,000 Units total) by mouth daily.    . cyanocobalamin 500 MCG tablet Take 500 mcg by mouth daily.    . fluticasone (FLONASE) 50 MCG/ACT nasal spray Place 2 sprays into both nostrils daily. (Patient taking differently: Place 2 sprays into both nostrils daily as needed. ) 16 g 6  . fluticasone (FLOVENT HFA) 44 MCG/ACT inhaler Inhale 2 puffs into the lungs 2 (two) times daily. 1 Inhaler 12  . lidocaine-prilocaine (EMLA) cream Apply 1 application topically as needed. 30 g 3  . mupirocin ointment (BACTROBAN) 2 % Apply 1 application topically 2 (two) times daily as needed. 22 g 2  . omeprazole (PRILOSEC) 20 MG  capsule Take 1 capsule (20 mg total) by mouth daily. 30 capsule 5  . terazosin (HYTRIN) 1 MG capsule Take 1 capsule (1 mg total) by mouth at bedtime.     No current facility-administered medications for this [name].    Facility-Administered Medications Ordered in Other Visits  Medication Dose Route Frequency Provider Last Rate Last Dose  . heparin lock flush 100 unit/mL  500 Units Intravenous Once Ryan Server, MD      . sodium chloride flush (NS) 0.9 % injection 10 mL  10 mL Intravenous Once Ryan Server, MD        PHYSICAL EXAMINATION:  ECOG PERFORMANCE STATUS: 0 - Asymptomatic   Vitals:   09/24/18 1001  BP: 134/80  Pulse: 62  Resp: 18  Temp: (!) 95 F (35 C)   Filed Weights   09/24/18 1001  Weight: 257 lb 8 oz (116.8 kg)  Physical Exam  Constitutional: He is  oriented to person, place, and time. No distress.  HENT:  Head: Normocephalic and atraumatic.  Mouth/Throat: No oropharyngeal exudate.  Eyes: Pupils are equal, round, and reactive to light. Conjunctivae and EOM are normal. No scleral icterus.  Neck: Normal range of motion. Neck supple.  Cardiovascular: Normal rate, regular rhythm and normal heart sounds. Exam reveals no friction rub.  No murmur heard. Pulmonary/Chest: Effort normal and breath sounds normal. No respiratory distress. He has no wheezes. He has no rales. He exhibits no tenderness.  Abdominal: Soft. He exhibits no distension. There is no abdominal tenderness.  Musculoskeletal: Normal range of motion.        General: No tenderness, deformity or edema.  Neurological: He is alert and oriented to person, place, and time. No cranial nerve deficit. He exhibits normal muscle tone. Coordination normal.  Skin: Skin is warm and dry. He is not diaphoretic. No erythema.  Psychiatric: Mood, memory, affect and judgment normal.      LABORATORY DATA: I have personally reviewed the data as listed: CBC    Component Value Date/Time   WBC 5.2 09/24/2018 0944   RBC 4.75  09/24/2018 0944   HGB 13.2 09/24/2018 0944   HCT 41.3 09/24/2018 0944   PLT 151 09/24/2018 0944   MCV 86.9 09/24/2018 0944   MCH 27.8 09/24/2018 0944   MCHC 32.0 09/24/2018 0944   RDW 13.5 09/24/2018 0944   LYMPHSABS 1.3 09/24/2018 0944   MONOABS 0.5 09/24/2018 0944   EOSABS 0.1 09/24/2018 0944   BASOSABS 0.0 09/24/2018 0944    CMP Latest Ref Rng & Units 09/24/2018 09/20/2018 07/13/2018  Glucose 70 - 99 mg/dL 98 - 96  BUN 8 - 23 mg/dL 18 - 15  Creatinine 0.61 - 1.24 mg/dL 1.13 1.06 1.18  Sodium 135 - 145 mmol/L 138 - 139  Potassium 3.5 - 5.1 mmol/L 4.4 - 4.3  Chloride 98 - 111 mmol/L 108 - 104  CO2 22 - 32 mmol/L 23 - 21  Calcium 8.9 - 10.3 mg/dL 9.1 - 9.2  Total Protein 6.5 - 8.1 g/dL 8.0 - 7.4  Total Bilirubin 0.3 - 1.2 mg/dL 1.2 - 0.8  Alkaline Phos 38 - 126 U/L 68 - 71  AST 15 - 41 U/L 19 - 14  ALT 0 - 44 U/L 19 - 22   RADIOGRAPHIC STUDIES: I have personally reviewed the radiological images as listed and agreed with the findings in the report. # 09/07/2017 CT chest abdomen pelvis w contrast Stable exam of the chest. No new lesion or progressive interval finding.  Post radiation changes. No metastatic disease.  Renal cysts, aortic atherosclerosis.  # 09/07/2017 Bone scan: no metastatic disease. Degenerative disease.  10/20/2017 Chest CXR  # PowerPort catheter noted with tip coiled in the superior vena cava, similar findings noted on prior exam. Persistent left upper lung infiltrate consistent with radiation changes. Similar findings noted on prior exams. # CT chest on 11/24/2017 Similar findings of radiation-induced consolidation in the medial left upper lobe.  No findings of recurrent or metastatic disease.  Left infrahilar node is similar and upper normal size. Looping of right side Port-A-Cath within the right internal jugular vein.  Aortic atherosclerosis.  Emphysema, coronary artery atherosclerosis. Esophageal air-fluid level suggest this mobility or gastroesophageal  reflux  ASSESSMENT/PLAN Cancer Staging Squamous cell lung cancer, left ([place]) Staging form: Lung, AJCC 8th Edition - Clinical stage from 09/27/2016: Stage IIIA (cT2a, cN2, cM0) - Signed by Ryan Server, MD on 09/29/2016  1. Squamous cell lung cancer, left (  [place])   2. Port-A-Cath in place    #Squamous cell lung cancer, stable disease.   CT chest With contrast was independently reviewed by me discussed with patient.  Stable disease.  No evidence of metastatic disease or recurrence. Continue surveillance CT scan every 3 months.  #Port-A-Cath in place, continue port flush every 6 to 8 weeks.  RTC: 3 months after CT scan. Orders Placed This Encounter  Procedures  . CT Chest Wo Contrast    Standing Status:   Future    Standing Expiration Date:   09/24/2019    Order Specific Question:   Preferred imaging location?    Answer:   [facility]    Order Specific Question:   Radiology Contrast Protocol - do NOT remove file path    Answer:   [email or web address]  . CBC with Differential/Platelet    Standing Status:   Future    Standing Expiration Date:   09/24/2019  . Comprehensive metabolic panel    Standing Status:   Future    Standing Expiration Date:   09/24/2019     Ryan Server, MD, PhD Hematology Oncology [facility] Pager- [phone] 09/24/18

## 2018-10-12 ENCOUNTER — Inpatient Hospital Stay: Payer: Medicare HMO

## 2018-10-12 ENCOUNTER — Other Ambulatory Visit: Payer: Self-pay

## 2018-10-12 DIAGNOSIS — Z95828 Presence of other vascular implants and grafts: Secondary | ICD-10-CM

## 2018-10-12 DIAGNOSIS — Z923 Personal history of irradiation: Secondary | ICD-10-CM | POA: Diagnosis not present

## 2018-10-12 DIAGNOSIS — Z807 Family history of other malignant neoplasms of lymphoid, hematopoietic and related tissues: Secondary | ICD-10-CM | POA: Diagnosis not present

## 2018-10-12 DIAGNOSIS — Z801 Family history of malignant neoplasm of trachea, bronchus and lung: Secondary | ICD-10-CM | POA: Diagnosis not present

## 2018-10-12 DIAGNOSIS — Z79899 Other long term (current) drug therapy: Secondary | ICD-10-CM | POA: Diagnosis not present

## 2018-10-12 DIAGNOSIS — R05 Cough: Secondary | ICD-10-CM | POA: Diagnosis not present

## 2018-10-12 DIAGNOSIS — C3412 Malignant neoplasm of upper lobe, left bronchus or lung: Secondary | ICD-10-CM | POA: Diagnosis not present

## 2018-10-12 DIAGNOSIS — Z87891 Personal history of nicotine dependence: Secondary | ICD-10-CM | POA: Diagnosis not present

## 2018-10-12 DIAGNOSIS — J700 Acute pulmonary manifestations due to radiation: Secondary | ICD-10-CM | POA: Diagnosis not present

## 2018-10-12 DIAGNOSIS — Z9221 Personal history of antineoplastic chemotherapy: Secondary | ICD-10-CM | POA: Diagnosis not present

## 2018-10-12 MED ORDER — HEPARIN SOD (PORK) LOCK FLUSH 100 UNIT/ML IV SOLN
500.0000 [IU] | Freq: Once | INTRAVENOUS | Status: AC
  Administered 2018-10-12: 11:00:00 500 [IU] via INTRAVENOUS

## 2018-10-12 MED ORDER — SODIUM CHLORIDE 0.9% FLUSH
10.0000 mL | Freq: Once | INTRAVENOUS | Status: AC
  Administered 2018-10-12: 10 mL via INTRAVENOUS
  Filled 2018-10-12: qty 10

## 2018-10-17 ENCOUNTER — Other Ambulatory Visit: Payer: Self-pay | Admitting: Oncology

## 2018-10-19 ENCOUNTER — Other Ambulatory Visit: Payer: Self-pay | Admitting: Internal Medicine

## 2018-10-19 DIAGNOSIS — I2510 Atherosclerotic heart disease of native coronary artery without angina pectoris: Secondary | ICD-10-CM

## 2018-12-13 ENCOUNTER — Inpatient Hospital Stay: Payer: Medicare HMO | Attending: Oncology

## 2018-12-13 ENCOUNTER — Other Ambulatory Visit: Payer: Self-pay

## 2018-12-13 DIAGNOSIS — C3412 Malignant neoplasm of upper lobe, left bronchus or lung: Secondary | ICD-10-CM | POA: Diagnosis not present

## 2018-12-13 DIAGNOSIS — Z801 Family history of malignant neoplasm of trachea, bronchus and lung: Secondary | ICD-10-CM | POA: Insufficient documentation

## 2018-12-13 DIAGNOSIS — Z808 Family history of malignant neoplasm of other organs or systems: Secondary | ICD-10-CM | POA: Diagnosis not present

## 2018-12-13 DIAGNOSIS — R05 Cough: Secondary | ICD-10-CM | POA: Diagnosis not present

## 2018-12-13 DIAGNOSIS — Z95828 Presence of other vascular implants and grafts: Secondary | ICD-10-CM

## 2018-12-13 DIAGNOSIS — Z833 Family history of diabetes mellitus: Secondary | ICD-10-CM | POA: Insufficient documentation

## 2018-12-13 DIAGNOSIS — Z87891 Personal history of nicotine dependence: Secondary | ICD-10-CM | POA: Diagnosis not present

## 2018-12-13 DIAGNOSIS — Z79899 Other long term (current) drug therapy: Secondary | ICD-10-CM | POA: Diagnosis not present

## 2018-12-13 MED ORDER — HEPARIN SOD (PORK) LOCK FLUSH 100 UNIT/ML IV SOLN
500.0000 [IU] | Freq: Once | INTRAVENOUS | Status: AC
  Administered 2018-12-13: 14:00:00 500 [IU] via INTRAVENOUS

## 2018-12-13 MED ORDER — SODIUM CHLORIDE 0.9% FLUSH
10.0000 mL | Freq: Once | INTRAVENOUS | Status: AC
  Administered 2018-12-13: 10 mL via INTRAVENOUS
  Filled 2018-12-13: qty 10

## 2018-12-14 ENCOUNTER — Inpatient Hospital Stay: Payer: Medicare HMO

## 2018-12-25 ENCOUNTER — Other Ambulatory Visit: Payer: Self-pay

## 2018-12-25 ENCOUNTER — Ambulatory Visit
Admission: RE | Admit: 2018-12-25 | Discharge: 2018-12-25 | Disposition: A | Discharge status: Home / Self Care | Payer: Medicare HMO | Source: Ambulatory Visit | Attending: Oncology | Admitting: Oncology

## 2018-12-25 DIAGNOSIS — C3492 Malignant neoplasm of unspecified part of left bronchus or lung: Secondary | ICD-10-CM | POA: Diagnosis not present

## 2018-12-28 ENCOUNTER — Encounter: Payer: Self-pay | Admitting: Oncology

## 2018-12-28 ENCOUNTER — Inpatient Hospital Stay: Payer: Medicare HMO | Admitting: Oncology

## 2018-12-28 ENCOUNTER — Other Ambulatory Visit: Payer: Self-pay

## 2018-12-28 ENCOUNTER — Inpatient Hospital Stay: Payer: Medicare HMO | Attending: Oncology

## 2018-12-28 VITALS — BP 115/72 | HR 76 | Temp 95.2°F | Resp 18 | Wt 260.9 lb

## 2018-12-28 DIAGNOSIS — J439 Emphysema, unspecified: Secondary | ICD-10-CM | POA: Diagnosis not present

## 2018-12-28 DIAGNOSIS — R5383 Other fatigue: Secondary | ICD-10-CM | POA: Insufficient documentation

## 2018-12-28 DIAGNOSIS — Z79899 Other long term (current) drug therapy: Secondary | ICD-10-CM | POA: Diagnosis not present

## 2018-12-28 DIAGNOSIS — I2510 Atherosclerotic heart disease of native coronary artery without angina pectoris: Secondary | ICD-10-CM | POA: Insufficient documentation

## 2018-12-28 DIAGNOSIS — Z87891 Personal history of nicotine dependence: Secondary | ICD-10-CM | POA: Diagnosis not present

## 2018-12-28 DIAGNOSIS — I700 Atherosclerosis of aorta: Secondary | ICD-10-CM | POA: Diagnosis not present

## 2018-12-28 DIAGNOSIS — Z801 Family history of malignant neoplasm of trachea, bronchus and lung: Secondary | ICD-10-CM | POA: Diagnosis not present

## 2018-12-28 DIAGNOSIS — C3492 Malignant neoplasm of unspecified part of left bronchus or lung: Secondary | ICD-10-CM

## 2018-12-28 DIAGNOSIS — Z808 Family history of malignant neoplasm of other organs or systems: Secondary | ICD-10-CM | POA: Diagnosis not present

## 2018-12-28 DIAGNOSIS — C3412 Malignant neoplasm of upper lobe, left bronchus or lung: Secondary | ICD-10-CM | POA: Diagnosis not present

## 2018-12-28 DIAGNOSIS — R590 Localized enlarged lymph nodes: Secondary | ICD-10-CM | POA: Insufficient documentation

## 2018-12-28 DIAGNOSIS — Z95828 Presence of other vascular implants and grafts: Secondary | ICD-10-CM

## 2018-12-28 DIAGNOSIS — Z833 Family history of diabetes mellitus: Secondary | ICD-10-CM | POA: Insufficient documentation

## 2018-12-28 LAB — COMPREHENSIVE METABOLIC PANEL
ALT: 18 U/L (ref 0–44)
AST: 18 U/L (ref 15–41)
Albumin: 4.2 g/dL (ref 3.5–5.0)
Alkaline Phosphatase: 65 U/L (ref 38–126)
Anion gap: 7 (ref 5–15)
BUN: 15 mg/dL (ref 8–23)
CO2: 22 mmol/L (ref 22–32)
Calcium: 8.9 mg/dL (ref 8.9–10.3)
Chloride: 106 mmol/L (ref 98–111)
Creatinine, Ser: 1.1 mg/dL (ref 0.61–1.24)
GFR calc Af Amer: >60 mL/min (ref >60)
GFR calc non Af Amer: >60 mL/min (ref >60)
Glucose, Bld: 115 mg/dL — ABNORMAL HIGH (ref 70–99)
Potassium: 4.1 mmol/L (ref 3.5–5.1)
Sodium: 135 mmol/L (ref 135–145)
Total Bilirubin: 1.1 mg/dL (ref 0.3–1.2)
Total Protein: 7.6 g/dL (ref 6.5–8.1)

## 2018-12-28 LAB — CBC WITH DIFFERENTIAL/PLATELET
Abs Immature Granulocytes: 0.02 10*3/uL (ref 0.00–0.07)
Basophils Absolute: 0 10*3/uL (ref 0.0–0.1)
Basophils Relative: 0 %
Eosinophils Absolute: 0.2 10*3/uL (ref 0.0–0.5)
Eosinophils Relative: 3 %
HCT: 43.3 % (ref 39.0–52.0)
Hemoglobin: 13.8 g/dL (ref 13.0–17.0)
Immature Granulocytes: 0 %
Lymphocytes Relative: 25 %
Lymphs Abs: 1.4 10*3/uL (ref 0.7–4.0)
MCH: 27.9 pg (ref 26.0–34.0)
MCHC: 31.9 g/dL (ref 30.0–36.0)
MCV: 87.7 fL (ref 80.0–100.0)
Monocytes Absolute: 0.3 10*3/uL (ref 0.1–1.0)
Monocytes Relative: 5 %
Neutro Abs: 3.9 10*3/uL (ref 1.7–7.7)
Neutrophils Relative %: 67 %
Platelets: 173 10*3/uL (ref 150–400)
RBC: 4.94 MIL/uL (ref 4.22–5.81)
RDW: 13.3 % (ref 11.5–15.5)
WBC: 5.8 10*3/uL (ref 4.0–10.5)
nRBC: 0 % (ref 0.0–0.2)

## 2018-12-28 NOTE — Progress Notes (Signed)
[place] [facility] Follow Up Visit  Patient Care Team: Glean Hess, MD as PCP - General (Internal Medicine) Earlie Server, MD as Consulting Physician (Oncology) Corey Skains, MD as Consulting Physician (Cardiology) Noreene Filbert, MD as Referring Physician (Radiation Oncology)  REASON FOR VISIT:  Follow up for evaluation prior to maintenance immunotherapy  HISTORY OF PRESENT ILLNESS/ PERTINENT ONCOLOGY HISTORY #  Stage IIIA lung squamous cell cancer. S/p concurrent chemotherapy and Radiation.[ completed Oct 2018], started on Durvalumab in Nov 2019.  06/09/17 CT chest. scan was presented on tumor board on 06/22/2017, consensus is stable disease, with postradiation changes. Monitor # Pathology: TPS 50%. 09/22/2016 left upper lobe biopsy showed squamous cell carcinoma. Foundation one test: ordered prior to patient first visit. Showed NO EGFR, ROS1, ALK, BRAF, MET mutation.   Cancer Treatment: S/p concurrent carbo/taxol and Radiation. Finished Oct 2018. Currently Durvalumab Q2 weeks. Treatment was interrupted after cycle 5 due to radiation pneumonitis. After steroid treatment, back on Durvalumab Q2weeks.  Patient finished total 1 year of durvalumab treatment in January 2020.  INTERVAL HISTORY Today [name] presented for follow up for assessment prior to immunotherapy maintenance for treatment of lung squamous cell carcinoma stage IIIA Patient continues to do well.  Denies any new complaints. 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 Negative for appetite change, chills, fever and unexpected weight change.  HENT:   Negative for hearing loss and voice change.   Eyes: Negative for eye problems and icterus.  Respiratory: Negative for chest tightness, cough and shortness of breath.    Cardiovascular: Negative for chest pain and leg swelling.  Gastrointestinal: Negative for abdominal distention and abdominal pain.  Endocrine: Negative for hot flashes.  Genitourinary: Negative for difficulty urinating, dysuria and frequency.   Musculoskeletal: Negative for arthralgias.  Skin: Negative for itching and rash.  Neurological: Negative for light-headedness and numbness.  Hematological: Negative for adenopathy. Does not bruise/bleed easily.  Psychiatric/Behavioral: Negative for confusion.       MEDICAL HISTORY: Past Medical History:  Diagnosis Date  . Enlarged prostate   . GERD (gastroesophageal reflux disease)   . Squamous cell lung cancer, left ([place]) 08/2016    SURGICAL HISTORY: Past Surgical History:  Procedure Laterality Date  . NO PAST SURGERIES    . PORTACATH PLACEMENT Right 10/05/2016   Procedure: INSERTION PORT-A-CATH;  Surgeon: [name], MD;  Location: [facility] ORS;  Service: General;  Laterality: Right;    SOCIAL HISTORY: Social History   Socioeconomic History  . Marital status: Single    Spouse name: Not on file  . Number of children: 2  . Years of education: Not on file  . Highest education level: 10th grade  Occupational History  . Occupation: truck Geophysicist/field seismologist    Comment: full time  Social Needs  . Financial resource strain: Not hard at all  . Food insecurity    Worry: Never true    Inability: Never true  . Transportation needs    Medical: No    Non-medical: No  Tobacco Use  . Smoking status: Former Smoker    Packs/day: 1.00    Years: 15.00    Pack years: 15.00    Types: Cigarettes    Quit date: 09/28/1992    Years since quitting: 26.2  . Smokeless tobacco: Never Used  Substance and Sexual  Activity  . Alcohol use: No  . Drug use: No  . Sexual activity: Not on file  Lifestyle  . Physical activity    Days per week: 5 days    Minutes per session: 20 min  . Stress: Not at all  Relationships  . Social connections    Talks on  phone: More than three times a week    Gets together: More than three times a week    Attends religious service: Never    Active member of club or organization: No    Attends meetings of clubs or organizations: Never    Relationship status: Divorced  . Intimate partner violence    Fear of current or ex partner: No    Emotionally abused: No    Physically abused: No    Forced sexual activity: No  Other Topics Concern  . Not on file  Social History Narrative  . Not on file    FAMILY HISTORY Family History  Problem Relation Age of Onset  . Lymphoma Mother   . Diabetes Mother   . Lung cancer Sister   . Lung cancer Brother   . Lung cancer Brother     ALLERGIES:  has No Known Allergies.  MEDICATIONS:  Current Outpatient Medications  Medication Sig Dispense Refill  . acetaminophen (TYLENOL) 500 MG tablet Take 1,000 mg by mouth every 4 (four) hours as needed.     Marland Kitchen albuterol (PROVENTIL HFA;VENTOLIN HFA) 108 (90 Base) MCG/ACT inhaler Inhale 2 puffs into the lungs every 6 (six) hours as needed for wheezing or shortness of breath. 1 Inhaler 2  . atorvastatin (LIPITOR) 10 MG tablet Take 1 tablet by mouth once daily 90 tablet 0  . Cholecalciferol (VITAMIN D3) 2000 units capsule Take 1 capsule (2,000 Units total) by mouth daily.    . cyanocobalamin 500 MCG tablet Take 500 mcg by mouth daily.    . fluticasone (FLONASE) 50 MCG/ACT nasal spray Place 2 sprays into both nostrils daily. (Patient taking differently: Place 2 sprays into both nostrils daily as needed. ) 16 g 6  . fluticasone (FLOVENT HFA) 44 MCG/ACT inhaler Inhale 2 puffs into the lungs 2 (two) times daily. 1 Inhaler 12  . lidocaine-prilocaine (EMLA) cream Apply 1 application topically as needed. 30 g 3  . mupirocin ointment (BACTROBAN) 2 % Apply 1 application topically 2 (two) times daily as needed. 22 g 2  . omeprazole (PRILOSEC) 20 MG capsule Take 1 capsule by mouth once daily 90 capsule 2  . terazosin (HYTRIN) 1 MG capsule Take  1 capsule (1 mg total) by mouth at bedtime.     No current facility-administered medications for this visit.     PHYSICAL EXAMINATION:  ECOG PERFORMANCE STATUS: 0 - Asymptomatic   Vitals:   12/28/18 1039  BP: 115/72  Pulse: 76  Resp: 18  Temp: (!) 95.2 F (35.1 C)   Filed Weights   12/28/18 1039  Weight: 260 lb 14.4 oz (118.3 kg)  Physical Exam  Constitutional: He is oriented to person, place, and time. No distress.  HENT:  Head: Normocephalic and atraumatic.  Nose: Nose normal.  Mouth/Throat: Oropharynx is clear and moist. No oropharyngeal exudate.  Eyes: Pupils are equal, round, and reactive to light. Conjunctivae and EOM are normal. No scleral icterus.  Neck: Normal range of motion. Neck supple.  Cardiovascular: Normal rate, regular rhythm and normal heart sounds. Exam reveals no friction rub.  No murmur heard. Pulmonary/Chest: Effort normal and breath sounds normal. No respiratory distress.  He has no wheezes. He has no rales. He exhibits no tenderness.  Abdominal: Soft. He exhibits no distension. There is no abdominal tenderness.  Musculoskeletal: Normal range of motion.        General: No tenderness, deformity or edema.  Neurological: He is alert and oriented to person, place, and time. No cranial nerve deficit. He exhibits normal muscle tone. Coordination normal.  Skin: Skin is warm and dry. He is not diaphoretic. No erythema.  Psychiatric: Mood, memory, affect and judgment normal.      LABORATORY DATA: I have personally reviewed the data as listed: CBC    Component Value Date/Time   WBC 5.8 12/28/2018 1017   RBC 4.94 12/28/2018 1017   HGB 13.8 12/28/2018 1017   HCT 43.3 12/28/2018 1017   PLT 173 12/28/2018 1017   MCV 87.7 12/28/2018 1017   MCH 27.9 12/28/2018 1017   MCHC 31.9 12/28/2018 1017   RDW 13.3 12/28/2018 1017   LYMPHSABS 1.4 12/28/2018 1017   MONOABS 0.3 12/28/2018 1017   EOSABS 0.2 12/28/2018 1017   BASOSABS 0.0 12/28/2018 1017    CMP  Latest Ref Rng & Units 12/28/2018 09/24/2018 09/20/2018  Glucose 70 - 99 mg/dL 115(H) 98 -  BUN 8 - 23 mg/dL 15 18 -  Creatinine 0.61 - 1.24 mg/dL 1.10 1.13 1.06  Sodium 135 - 145 mmol/L 135 138 -  Potassium 3.5 - 5.1 mmol/L 4.1 4.4 -  Chloride 98 - 111 mmol/L 106 108 -  CO2 22 - 32 mmol/L 22 23 -  Calcium 8.9 - 10.3 mg/dL 8.9 9.1 -  Total Protein 6.5 - 8.1 g/dL 7.6 8.0 -  Total Bilirubin 0.3 - 1.2 mg/dL 1.1 1.2 -  Alkaline Phos 38 - 126 U/L 65 68 -  AST 15 - 41 U/L 18 19 -  ALT 0 - 44 U/L 18 19 -   RADIOGRAPHIC STUDIES: I have personally reviewed the radiological images as listed and agreed with the findings in the report. Ct Chest Wo Contrast  Result Date: 12/25/2018 CLINICAL DATA:  Followup left lung non-small-cell carcinoma. EXAM: CT CHEST WITHOUT CONTRAST TECHNIQUE: Multidetector CT imaging of the chest was performed following the standard protocol without IV contrast. COMPARISON:  09/20/2018 FINDINGS: Cardiovascular: No acute findings. Aortic and coronary artery atherosclerosis. Mediastinum/Nodes: 12 mm right paratracheal lymph node remains stable. No other pathologically enlarged lymph nodes identified. Lungs/Pleura: Stable post radiation changes in central left upper lobe and superior left lower lobe. No new or enlarging pulmonary nodules or masses identified. Mild-to-moderate centrilobular emphysema again noted. No evidence pulmonary infiltrate or pleural effusion. Upper Abdomen:  Stable small cysts seen in dome right lobe. Musculoskeletal:  No suspicious bone lesions. IMPRESSION: Stable post radiation changes in left lung and borderline enlarged mediastinal lymph node. No new or progressive disease within the thorax. Aortic Atherosclerosis (ICD10-I70.0) and Emphysema (ICD10-J43.9). Coronary artery atherosclerosis. Electronically Signed   By: Marlaine Hind M.D.   On: 12/25/2018 08:35    ASSESSMENT/PLAN Cancer Staging Squamous cell lung cancer, left [facility]) Staging form: Lung, AJCC 8th  Edition - Clinical stage from 09/27/2016: Stage IIIA (cT2a, cN2, cM0) - Signed by Earlie Server, MD on 09/29/2016  1. Squamous cell lung cancer, left ([place])   2. Port-A-Cath in place   3. Aortic atherosclerosis ([facility])    #Squamous cell lung cancer, stable disease.   Interval surveillance CT scan was independently reviewed by me and discussed with patient. Stable disease.  No evidence of metastatic disease or recurrence.  Labs are reviewed  and discussed with patient. Continue CT surveillance every 3 to 4 months.  Port-A-Cath in place, continue port flush every 6 to 8 weeks.  We discussed about discontinuing Mediport 2 years after he finishes treatments- Jan 2022. Atherosclerosis, lifestyle modification discussed with patient.  Continue follow-up with primary care provider for monitoring cholesterol.  Patient is on atorvastatin  RTC: 3 months after CT scan. Orders Placed This Encounter  Procedures  . CT Chest W Contrast    Standing Status:   Future    Standing Expiration Date:   12/28/2019    Order Specific Question:   ** REASON FOR EXAM (FREE TEXT)    Answer:   lung cancer monitoring    Order Specific Question:   If indicated for the ordered procedure, I authorize the administration of contrast media per Radiology protocol    Answer:   Yes    Order Specific Question:   Preferred imaging location?    Answer:   [facility]    Order Specific Question:   Radiology Contrast Protocol - do NOT remove file path    Answer:   [email or web address]  . CBC with Differential    Standing Status:   Future    Standing Expiration Date:   12/28/2019  . Comprehensive metabolic panel    Standing Status:   Future    Standing Expiration Date:   12/28/2019     Earlie Server, MD, PhD Hematology Oncology [facility] Pager- [phone] 12/28/18

## 2018-12-28 NOTE — Progress Notes (Signed)
Patient does not offer any problems today.  

## 2019-01-14 ENCOUNTER — Encounter: Payer: Self-pay | Admitting: Internal Medicine

## 2019-01-14 ENCOUNTER — Other Ambulatory Visit: Payer: Self-pay

## 2019-01-14 ENCOUNTER — Ambulatory Visit (INDEPENDENT_AMBULATORY_CARE_PROVIDER_SITE_OTHER): Payer: Medicare HMO | Admitting: Internal Medicine

## 2019-01-14 VITALS — BP 122/72 | HR 69 | Ht 71.0 in | Wt 263.0 lb

## 2019-01-14 DIAGNOSIS — J309 Allergic rhinitis, unspecified: Secondary | ICD-10-CM | POA: Diagnosis not present

## 2019-01-14 DIAGNOSIS — R7303 Prediabetes: Secondary | ICD-10-CM

## 2019-01-14 DIAGNOSIS — I25118 Atherosclerotic heart disease of native coronary artery with other forms of angina pectoris: Secondary | ICD-10-CM | POA: Diagnosis not present

## 2019-01-14 DIAGNOSIS — Z0000 Encounter for general adult medical examination without abnormal findings: Secondary | ICD-10-CM

## 2019-01-14 DIAGNOSIS — E782 Mixed hyperlipidemia: Secondary | ICD-10-CM | POA: Diagnosis not present

## 2019-01-14 MED ORDER — ATORVASTATIN CALCIUM 10 MG PO TABS: 10.0000 mg | ORAL_TABLET | Freq: Every day | ORAL | 3 refills | Status: AC

## 2019-01-14 MED ORDER — FLUTICASONE PROPIONATE 50 MCG/ACT NA SUSP: 2.0000 | Freq: Every day | NASAL | 6 refills | Status: AC

## 2019-01-14 NOTE — Patient Instructions (Signed)
Start baby aspirin 81 mg per day  Start Flonase spray every day

## 2019-01-14 NOTE — Progress Notes (Signed)
Date:  01/14/2019   Name:  [name]   DOB:  1945-06-19   MRN:  [id number]   Chief Complaint: Annual Exam [name] is a 73 y.o. male who presents today for his Complete Annual Exam. He feels fairly well. He reports exercising none. He reports he is sleeping fairly well.   Immunization History  Administered Date(s) Administered  . Influenza, High Dose Seasonal PF 01/13/2017, 11/26/2017, 11/10/2018  . Pneumococcal Conjugate-13 11/10/2018  . Pneumococcal Polysaccharide-23 07/13/2018    Gastroesophageal Reflux He complains of heartburn. He reports no abdominal pain, no chest pain, no choking or no wheezing. This is a recurrent problem. The problem occurs rarely. Pertinent negatives include no fatigue. He has tried a PPI for the symptoms. The treatment provided significant relief.  Hyperlipidemia This is a chronic problem. The problem is controlled. Pertinent negatives include no chest pain, myalgias or shortness of breath. Current antihyperlipidemic treatment includes statins. The current treatment provides significant improvement of lipids.  CAD - seen on screening CT lungs.  He was seen by cardiology and had ECHO with normal EF, mild TR, MR.  He continues on lipitor but had stopped aspirin during his cancer treatment.  Lab Results  Component Value Date   CREATININE 1.10 12/28/2018   BUN 15 12/28/2018   NA 135 12/28/2018   K 4.1 12/28/2018   CL 106 12/28/2018   CO2 22 12/28/2018   Lab Results  Component Value Date   CHOL 162 07/13/2018   HDL 46 07/13/2018   LDLCALC 99 07/13/2018   TRIG 86 07/13/2018   CHOLHDL 3.5 07/13/2018   Lab Results  Component Value Date   TSH 1.314 09/24/2018   Lab Results  Component Value Date   HGBA1C 5.6 07/13/2018     Review of Systems  Constitutional: Negative for appetite change, chills, diaphoresis, fatigue and unexpected weight change.  HENT: Positive for postnasal drip (phlegm in throat when he lays down). Negative for  hearing loss, tinnitus, trouble swallowing and voice change.   Eyes: Negative for visual disturbance.  Respiratory: Negative for choking, shortness of breath and wheezing.   Cardiovascular: Negative for chest pain, palpitations and leg swelling.  Gastrointestinal: Positive for heartburn. Negative for abdominal pain, blood in stool, constipation and diarrhea.  Genitourinary: Negative for difficulty urinating, dysuria and frequency.  Musculoskeletal: Negative for arthralgias, back pain and myalgias.  Skin: Negative for color change and rash.  Allergic/Immunologic: Positive for environmental allergies.  Neurological: Negative for dizziness, syncope and headaches.  Hematological: Negative for adenopathy.  Psychiatric/Behavioral: Negative for dysphoric mood and sleep disturbance.    Patient Active Problem List   Diagnosis Date Noted  . Atherosclerosis of aorta ([place]) 06/24/2018  . Benign essential HTN 03/29/2018  . Coronary artery disease of native artery of native heart with stable angina pectoris ([place]) 03/27/2018  . Mixed hyperlipidemia 10/06/2017  . Allergic rhinitis 10/06/2017  . Encounter for antineoplastic immunotherapy 02/10/2017  . Prediabetes 01/25/2017  . BPH (benign prostatic hyperplasia) 10/21/2016  . Squamous cell lung cancer, left ([place]) 09/07/2016  . B12 deficiency 09/07/2016  . Vitamin D deficiency 09/07/2016  . Obesity (BMI 30-39.9) 09/07/2016  . Colon polyps 09/07/2016    No Known Allergies  Past Surgical History:  Procedure Laterality Date  . NO PAST SURGERIES    . PORTACATH PLACEMENT Right 10/05/2016   Procedure: INSERTION PORT-A-CATH;  Surgeon: [name], MD;  Location: [facility] ORS;  Service: General;  Laterality: Right;    Social History   Tobacco  Use  . Smoking status: Former Smoker    Packs/day: 1.00    Years: 15.00    Pack years: 15.00    Types: Cigarettes    Quit date: 09/28/1992    Years since quitting: 26.3  . Smokeless tobacco: Never Used   Substance Use Topics  . Alcohol use: No  . Drug use: No     Medication list has been reviewed and updated.  Current Meds  Medication Sig  . acetaminophen (TYLENOL) 500 MG tablet Take 1,000 mg by mouth every 4 (four) hours as needed.   Marland Kitchen albuterol (PROVENTIL HFA;VENTOLIN HFA) 108 (90 Base) MCG/ACT inhaler Inhale 2 puffs into the lungs every 6 (six) hours as needed for wheezing or shortness of breath.  Marland Kitchen atorvastatin (LIPITOR) 10 MG tablet Take 1 tablet by mouth once daily  . Cholecalciferol (VITAMIN D3) 2000 units capsule Take 1 capsule (2,000 Units total) by mouth daily.  . cyanocobalamin 500 MCG tablet Take 500 mcg by mouth daily.  . fluticasone (FLONASE) 50 MCG/ACT nasal spray Place 2 sprays into both nostrils daily. (Patient taking differently: Place 2 sprays into both nostrils daily as needed. )  . fluticasone (FLOVENT HFA) 44 MCG/ACT inhaler Inhale 2 puffs into the lungs 2 (two) times daily.  Marland Kitchen lidocaine-prilocaine (EMLA) cream Apply 1 application topically as needed.  . mupirocin ointment (BACTROBAN) 2 % Apply 1 application topically 2 (two) times daily as needed.  Marland Kitchen omeprazole (PRILOSEC) 20 MG capsule Take 1 capsule by mouth once daily  . terazosin (HYTRIN) 1 MG capsule Take 1 capsule (1 mg total) by mouth at bedtime.    PHQ 2/9 Scores 01/14/2019 07/13/2018 03/27/2018 02/28/2018  PHQ - 2 Score 0 0 0 0    BP Readings from Last 3 Encounters:  01/14/19 122/72  12/28/18 115/72  09/24/18 134/80    Physical Exam Vitals signs and nursing note reviewed.  Constitutional:      Appearance: Normal appearance. He is well-developed.  HENT:     Head: Normocephalic.     Right Ear: Tympanic membrane, ear canal and external ear normal.     Left Ear: Tympanic membrane, ear canal and external ear normal.     Nose: Nose normal.     Mouth/Throat:     Pharynx: Uvula midline.  Eyes:     Conjunctiva/sclera: Conjunctivae normal.     Pupils: Pupils are equal, round, and reactive to light.   Neck:     Musculoskeletal: Normal range of motion and neck supple.     Thyroid: No thyromegaly.     Vascular: No carotid bruit.  Cardiovascular:     Rate and Rhythm: Normal rate and regular rhythm.     Heart sounds: Normal heart sounds.  Pulmonary:     Effort: Pulmonary effort is normal.     Breath sounds: Normal breath sounds. No wheezing.  Chest:     Breasts:        Right: No mass.        Left: No mass.  Abdominal:     General: Bowel sounds are normal.     Palpations: Abdomen is soft.     Tenderness: There is no abdominal tenderness.  Musculoskeletal:     Right lower leg: No edema.     Left lower leg: No edema.  Lymphadenopathy:     Cervical: No cervical adenopathy.  Skin:    General: Skin is warm and dry.     Capillary Refill: Capillary refill takes less than 2 seconds.  Neurological:  General: No focal deficit present.     Mental Status: He is alert and oriented to person, place, and time.     Deep Tendon Reflexes: Reflexes are normal and symmetric.  Psychiatric:        Speech: Speech normal.        Behavior: Behavior normal.        Thought Content: Thought content normal.        Judgment: Judgment normal.     Wt Readings from Last 3 Encounters:  01/14/19 263 lb (119.3 kg)  12/28/18 260 lb 14.4 oz (118.3 kg)  09/24/18 257 lb 8 oz (116.8 kg)    BP 122/72   Pulse 69   Ht 5\' 11"  (1.803 m)   Wt 263 lb (119.3 kg)   SpO2 97%   BMI 36.68 kg/m   Assessment and Plan: 1. Annual physical exam Normal exam except for weight  2. Mixed hyperlipidemia Tolerating statin medication without side effects at this time Continue same therapy without change at this time. - Lipid panel - atorvastatin (LIPITOR) 10 MG tablet; Take 1 tablet (10 mg total) by mouth daily.  Dispense: 90 tablet; Refill: 3  3. Prediabetes Continue to work on diet and weight loss - Comprehensive metabolic panel - Hemoglobin A1c  4. Coronary artery disease of native artery of native heart with  stable angina pectoris ([place]) Asymptomatic with essentially normal ECHO Continue statin therapy, resume ASA 81 mg daily - CBC with Differential/Platelet  5. Allergic rhinitis, unspecified seasonality, unspecified trigger PND with mucus at night Start back on Flonase and use daily - fluticasone (FLONASE) 50 MCG/ACT nasal spray; Place 2 sprays into both nostrils daily.  Dispense: 16 g; Refill: 6   Partially dictated using Editor, commissioning. Any errors are unintentional.  Halina Maidens, MD [facility]  01/14/2019

## 2019-01-15 LAB — COMPREHENSIVE METABOLIC PANEL
ALT: 19 IU/L (ref 0–44)
AST: 17 IU/L (ref 0–40)
Albumin/Globulin Ratio: 1.6 (ref 1.2–2.2)
Albumin: 4.6 g/dL (ref 3.7–4.7)
Alkaline Phosphatase: 78 IU/L (ref 39–117)
BUN/Creatinine Ratio: 14 (ref 10–24)
BUN: 17 mg/dL (ref 8–27)
Bilirubin Total: 1 mg/dL (ref 0.0–1.2)
CO2: 20 mmol/L (ref 20–29)
Calcium: 9.7 mg/dL (ref 8.6–10.2)
Chloride: 106 mmol/L (ref 96–106)
Creatinine, Ser: 1.21 mg/dL (ref 0.76–1.27)
GFR calc Af Amer: 69 mL/min/{1.73_m2} (ref >59)
GFR calc non Af Amer: 59 mL/min/{1.73_m2} — ABNORMAL LOW (ref >59)
Globulin, Total: 2.9 g/dL (ref 1.5–4.5)
Glucose: 85 mg/dL (ref 65–99)
Potassium: 4.7 mmol/L (ref 3.5–5.2)
Sodium: 139 mmol/L (ref 134–144)
Total Protein: 7.5 g/dL (ref 6.0–8.5)

## 2019-01-15 LAB — CBC WITH DIFFERENTIAL/PLATELET
Basophils Absolute: 0 10*3/uL (ref 0.0–0.2)
Basos: 0 %
EOS (ABSOLUTE): 0.1 10*3/uL (ref 0.0–0.4)
Eos: 3 %
Hematocrit: 43.1 % (ref 37.5–51.0)
Hemoglobin: 13.8 g/dL (ref 13.0–17.7)
Immature Grans (Abs): 0 10*3/uL (ref 0.0–0.1)
Immature Granulocytes: 0 %
Lymphocytes Absolute: 1.4 10*3/uL (ref 0.7–3.1)
Lymphs: 26 %
MCH: 28.2 pg (ref 26.6–33.0)
MCHC: 32 g/dL (ref 31.5–35.7)
MCV: 88 fL (ref 79–97)
Monocytes Absolute: 0.6 10*3/uL (ref 0.1–0.9)
Monocytes: 10 %
Neutrophils Absolute: 3.3 10*3/uL (ref 1.4–7.0)
Neutrophils: 61 %
Platelets: 175 10*3/uL (ref 150–450)
RBC: 4.9 x10E6/uL (ref 4.14–5.80)
RDW: 13.2 % (ref 11.6–15.4)
WBC: 5.4 10*3/uL (ref 3.4–10.8)

## 2019-01-15 LAB — LIPID PANEL
Chol/HDL Ratio: 3.5 ratio (ref 0.0–5.0)
Cholesterol, Total: 162 mg/dL (ref 100–199)
HDL: 46 mg/dL (ref >39)
LDL Chol Calc (NIH): 101 mg/dL — ABNORMAL HIGH (ref 0–99)
Triglycerides: 76 mg/dL (ref 0–149)
VLDL Cholesterol Cal: 15 mg/dL (ref 5–40)

## 2019-01-15 LAB — HEMOGLOBIN A1C
Est. average glucose Bld gHb Est-mCnc: 120 mg/dL
Hgb A1c MFr Bld: 5.8 % — ABNORMAL HIGH (ref 4.8–5.6)

## 2019-01-18 ENCOUNTER — Telehealth: Payer: Self-pay

## 2019-01-18 NOTE — Telephone Encounter (Signed)
Patient called back and was informed of current labs. He asked me to mail them to his home. Verified his address before mailing labs.  Benedict Needy, CMA

## 2019-01-23 ENCOUNTER — Other Ambulatory Visit: Payer: Self-pay

## 2019-01-24 ENCOUNTER — Inpatient Hospital Stay: Payer: Medicare HMO | Attending: Oncology

## 2019-01-24 ENCOUNTER — Other Ambulatory Visit: Payer: Self-pay

## 2019-01-24 DIAGNOSIS — Z87891 Personal history of nicotine dependence: Secondary | ICD-10-CM | POA: Diagnosis not present

## 2019-01-24 DIAGNOSIS — Z452 Encounter for adjustment and management of vascular access device: Secondary | ICD-10-CM | POA: Diagnosis not present

## 2019-01-24 DIAGNOSIS — I700 Atherosclerosis of aorta: Secondary | ICD-10-CM | POA: Diagnosis not present

## 2019-01-24 DIAGNOSIS — C3412 Malignant neoplasm of upper lobe, left bronchus or lung: Secondary | ICD-10-CM | POA: Insufficient documentation

## 2019-01-24 DIAGNOSIS — Z79899 Other long term (current) drug therapy: Secondary | ICD-10-CM | POA: Insufficient documentation

## 2019-01-24 DIAGNOSIS — R5383 Other fatigue: Secondary | ICD-10-CM | POA: Insufficient documentation

## 2019-01-24 DIAGNOSIS — Z95828 Presence of other vascular implants and grafts: Secondary | ICD-10-CM

## 2019-01-24 MED ORDER — HEPARIN SOD (PORK) LOCK FLUSH 100 UNIT/ML IV SOLN
500.0000 [IU] | Freq: Once | INTRAVENOUS | Status: AC
  Administered 2019-01-24: 500 [IU] via INTRAVENOUS

## 2019-01-24 MED ORDER — SODIUM CHLORIDE 0.9% FLUSH
10.0000 mL | Freq: Once | INTRAVENOUS | Status: AC
  Administered 2019-01-24: 16:00:00 10 mL via INTRAVENOUS
  Filled 2019-01-24: qty 10

## 2019-02-04 ENCOUNTER — Encounter: Payer: Self-pay | Admitting: Emergency Medicine

## 2019-02-04 ENCOUNTER — Inpatient Hospital Stay
Admission: EM | Admit: 2019-02-04 | Discharge: 2019-02-09 | DRG: 177 | Disposition: A | Discharge status: Other Acute Inpatient Hospital | Payer: Medicare HMO | Attending: Internal Medicine | Admitting: Internal Medicine

## 2019-02-04 ENCOUNTER — Ambulatory Visit (INDEPENDENT_AMBULATORY_CARE_PROVIDER_SITE_OTHER)
Admission: EM | Admit: 2019-02-04 | Discharge: 2019-02-04 | Disposition: A | Discharge status: Home / Self Care | Payer: Medicare HMO | Source: Home / Self Care | Attending: Family Medicine | Admitting: Family Medicine

## 2019-02-04 ENCOUNTER — Ambulatory Visit (INDEPENDENT_AMBULATORY_CARE_PROVIDER_SITE_OTHER): Payer: Medicare HMO

## 2019-02-04 ENCOUNTER — Other Ambulatory Visit: Payer: Self-pay

## 2019-02-04 DIAGNOSIS — Z79899 Other long term (current) drug therapy: Secondary | ICD-10-CM

## 2019-02-04 DIAGNOSIS — R509 Fever, unspecified: Secondary | ICD-10-CM | POA: Diagnosis not present

## 2019-02-04 DIAGNOSIS — E538 Deficiency of other specified B group vitamins: Secondary | ICD-10-CM | POA: Diagnosis present

## 2019-02-04 DIAGNOSIS — J9601 Acute respiratory failure with hypoxia: Secondary | ICD-10-CM | POA: Diagnosis not present

## 2019-02-04 DIAGNOSIS — N400 Enlarged prostate without lower urinary tract symptoms: Secondary | ICD-10-CM | POA: Diagnosis present

## 2019-02-04 DIAGNOSIS — Z7951 Long term (current) use of inhaled steroids: Secondary | ICD-10-CM | POA: Diagnosis not present

## 2019-02-04 DIAGNOSIS — Z20822 Contact with and (suspected) exposure to covid-19: Secondary | ICD-10-CM

## 2019-02-04 DIAGNOSIS — I10 Essential (primary) hypertension: Secondary | ICD-10-CM | POA: Diagnosis present

## 2019-02-04 DIAGNOSIS — U071 COVID-19: Principal | ICD-10-CM | POA: Diagnosis present

## 2019-02-04 DIAGNOSIS — Z9221 Personal history of antineoplastic chemotherapy: Secondary | ICD-10-CM

## 2019-02-04 DIAGNOSIS — R05 Cough: Secondary | ICD-10-CM

## 2019-02-04 DIAGNOSIS — Z801 Family history of malignant neoplasm of trachea, bronchus and lung: Secondary | ICD-10-CM

## 2019-02-04 DIAGNOSIS — D696 Thrombocytopenia, unspecified: Secondary | ICD-10-CM | POA: Diagnosis present

## 2019-02-04 DIAGNOSIS — K219 Gastro-esophageal reflux disease without esophagitis: Secondary | ICD-10-CM | POA: Diagnosis not present

## 2019-02-04 DIAGNOSIS — I25118 Atherosclerotic heart disease of native coronary artery with other forms of angina pectoris: Secondary | ICD-10-CM | POA: Diagnosis present

## 2019-02-04 DIAGNOSIS — Z833 Family history of diabetes mellitus: Secondary | ICD-10-CM | POA: Diagnosis not present

## 2019-02-04 DIAGNOSIS — E559 Vitamin D deficiency, unspecified: Secondary | ICD-10-CM | POA: Diagnosis present

## 2019-02-04 DIAGNOSIS — J1289 Other viral pneumonia: Secondary | ICD-10-CM | POA: Diagnosis present

## 2019-02-04 DIAGNOSIS — Z20828 Contact with and (suspected) exposure to other viral communicable diseases: Secondary | ICD-10-CM | POA: Diagnosis not present

## 2019-02-04 DIAGNOSIS — Z85118 Personal history of other malignant neoplasm of bronchus and lung: Secondary | ICD-10-CM

## 2019-02-04 DIAGNOSIS — E782 Mixed hyperlipidemia: Secondary | ICD-10-CM | POA: Diagnosis present

## 2019-02-04 DIAGNOSIS — R6510 Systemic inflammatory response syndrome (SIRS) of non-infectious origin without acute organ dysfunction: Secondary | ICD-10-CM | POA: Diagnosis not present

## 2019-02-04 DIAGNOSIS — J45909 Unspecified asthma, uncomplicated: Secondary | ICD-10-CM | POA: Diagnosis present

## 2019-02-04 DIAGNOSIS — Z807 Family history of other malignant neoplasms of lymphoid, hematopoietic and related tissues: Secondary | ICD-10-CM

## 2019-02-04 DIAGNOSIS — N179 Acute kidney failure, unspecified: Secondary | ICD-10-CM | POA: Diagnosis not present

## 2019-02-04 DIAGNOSIS — N281 Cyst of kidney, acquired: Secondary | ICD-10-CM | POA: Diagnosis not present

## 2019-02-04 DIAGNOSIS — C4492 Squamous cell carcinoma of skin, unspecified: Secondary | ICD-10-CM | POA: Diagnosis not present

## 2019-02-04 DIAGNOSIS — E669 Obesity, unspecified: Secondary | ICD-10-CM | POA: Diagnosis present

## 2019-02-04 LAB — URINALYSIS, COMPLETE (UACMP) WITH MICROSCOPIC
Bacteria, UA: NONE SEEN
Bilirubin Urine: NEGATIVE
Glucose, UA: NEGATIVE mg/dL
Hgb urine dipstick: NEGATIVE
Ketones, ur: NEGATIVE mg/dL
Leukocytes,Ua: NEGATIVE
Nitrite: NEGATIVE
Protein, ur: 30 mg/dL — AB
Specific Gravity, Urine: 1.02 (ref 1.005–1.030)
pH: 5 (ref 5.0–8.0)

## 2019-02-04 LAB — CBC WITH DIFFERENTIAL/PLATELET
Abs Immature Granulocytes: 0.01 10*3/uL (ref 0.00–0.07)
Basophils Absolute: 0 10*3/uL (ref 0.0–0.1)
Basophils Relative: 0 %
Eosinophils Absolute: 0 10*3/uL (ref 0.0–0.5)
Eosinophils Relative: 0 %
HCT: 42.2 % (ref 39.0–52.0)
Hemoglobin: 14.2 g/dL (ref 13.0–17.0)
Immature Granulocytes: 0 %
Lymphocytes Relative: 15 %
Lymphs Abs: 0.9 10*3/uL (ref 0.7–4.0)
MCH: 27.8 pg (ref 26.0–34.0)
MCHC: 33.6 g/dL (ref 30.0–36.0)
MCV: 82.7 fL (ref 80.0–100.0)
Monocytes Absolute: 0.3 10*3/uL (ref 0.1–1.0)
Monocytes Relative: 5 %
Neutro Abs: 4.5 10*3/uL (ref 1.7–7.7)
Neutrophils Relative %: 80 %
Platelets: 122 10*3/uL — ABNORMAL LOW (ref 150–400)
RBC: 5.1 MIL/uL (ref 4.22–5.81)
RDW: 13.3 % (ref 11.5–15.5)
WBC: 5.6 10*3/uL (ref 4.0–10.5)
nRBC: 0 % (ref 0.0–0.2)

## 2019-02-04 LAB — COMPREHENSIVE METABOLIC PANEL
ALT: 31 U/L (ref 0–44)
AST: 43 U/L — ABNORMAL HIGH (ref 15–41)
Albumin: 4 g/dL (ref 3.5–5.0)
Alkaline Phosphatase: 53 U/L (ref 38–126)
Anion gap: 11 (ref 5–15)
BUN: 18 mg/dL (ref 8–23)
CO2: 23 mmol/L (ref 22–32)
Calcium: 8.6 mg/dL — ABNORMAL LOW (ref 8.9–10.3)
Chloride: 100 mmol/L (ref 98–111)
Creatinine, Ser: 1.42 mg/dL — ABNORMAL HIGH (ref 0.61–1.24)
GFR calc Af Amer: 57 mL/min — ABNORMAL LOW (ref >60)
GFR calc non Af Amer: 49 mL/min — ABNORMAL LOW (ref >60)
Glucose, Bld: 118 mg/dL — ABNORMAL HIGH (ref 70–99)
Potassium: 3.8 mmol/L (ref 3.5–5.1)
Sodium: 134 mmol/L — ABNORMAL LOW (ref 135–145)
Total Bilirubin: 0.8 mg/dL (ref 0.3–1.2)
Total Protein: 8.4 g/dL — ABNORMAL HIGH (ref 6.5–8.1)

## 2019-02-04 LAB — SARS CORONAVIRUS 2 AG (30 MIN TAT): SARS Coronavirus 2 Ag: NEGATIVE

## 2019-02-04 LAB — PROCALCITONIN: Procalcitonin: <0.1 ng/mL

## 2019-02-04 LAB — LACTIC ACID, PLASMA: Lactic Acid, Venous: 1.7 mmol/L (ref 0.5–1.9)

## 2019-02-04 LAB — POC SARS CORONAVIRUS 2 AG: SARS Coronavirus 2 Ag: NEGATIVE

## 2019-02-04 MED ORDER — ACETAMINOPHEN 325 MG PO TABS
650.0000 mg | ORAL_TABLET | Freq: Once | ORAL | Status: AC
  Administered 2019-02-04: 18:00:00 650 mg via ORAL

## 2019-02-04 NOTE — ED Provider Notes (Signed)
Emergency Department Provider Note  ____________________________________________  Time seen: Approximately 10:25 PM  I have reviewed the triage vital signs and the nursing notes.   HISTORY  Chief Complaint Fever and Cough   Historian Patient     HPI [name] is a 73 y.o. male with a history of squamous cell lung cancer receiving radiation and chemotherapy 24 months ago, presents to the emergency department with fever, body aches, diarrhea and weakness for the past 3 days.  Fever has been as high as 103 F at home.  Patient denies chest pain, chest tightness or abdominal pain.  No dysuria, hematuria or increased urinary frequency.  Patient states that he was seen and evaluated at a local urgent care and was referred to the emergency department given patient's presenting symptoms and history of malignancy.   Past Medical History:  Diagnosis Date  . Enlarged prostate   . GERD (gastroesophageal reflux disease)   . Squamous cell lung cancer, left ([place]) 08/2016     Immunizations up to date:  Yes.     Past Medical History:  Diagnosis Date  . Enlarged prostate   . GERD (gastroesophageal reflux disease)   . Squamous cell lung cancer, left ([place]) 08/2016    Patient Active Problem List   Diagnosis Date Noted  . Atherosclerosis of aorta ([place]) 06/24/2018  . Benign essential HTN 03/29/2018  . Coronary artery disease of native artery of native heart with stable angina pectoris ([place]) 03/27/2018  . Mixed hyperlipidemia 10/06/2017  . Allergic rhinitis 10/06/2017  . Encounter for antineoplastic immunotherapy 02/10/2017  . Prediabetes 01/25/2017  . BPH (benign prostatic hyperplasia) 10/21/2016  . Squamous cell lung cancer, left ([place]) 09/07/2016  . B12 deficiency 09/07/2016  . Vitamin D deficiency 09/07/2016  . Obesity (BMI 30-39.9) 09/07/2016  . Colon polyps 09/07/2016    Past Surgical History:  Procedure Laterality Date  . NO PAST SURGERIES    . PORTACATH PLACEMENT  Right 10/05/2016   Procedure: INSERTION PORT-A-CATH;  Surgeon: [name], MD;  Location: [facility] ORS;  Service: General;  Laterality: Right;    Prior to Admission medications   Medication Sig Start Date End Date Taking? Authorizing Provider  acetaminophen (TYLENOL) 500 MG tablet Take 1,000 mg by mouth every 4 (four) hours as needed.     [provider]  albuterol (PROVENTIL HFA;VENTOLIN HFA) 108 (90 Base) MCG/ACT inhaler Inhale 2 puffs into the lungs every 6 (six) hours as needed for wheezing or shortness of breath. 04/28/17   Earlie Server, MD  atorvastatin (LIPITOR) 10 MG tablet Take 1 tablet (10 mg total) by mouth daily. 01/14/19   Glean Hess, MD  Cholecalciferol (VITAMIN D3) 2000 units capsule Take 1 capsule (2,000 Units total) by mouth daily. 09/08/16   Plonk, Gwyndolyn Saxon, MD  cyanocobalamin 500 MCG tablet Take 500 mcg by mouth daily.    [provider]  fluticasone (FLONASE) 50 MCG/ACT nasal spray Place 2 sprays into both nostrils daily. 01/14/19   Glean Hess, MD  fluticasone (FLOVENT HFA) 44 MCG/ACT inhaler Inhale 2 puffs into the lungs 2 (two) times daily. 04/28/17   Earlie Server, MD  lidocaine-prilocaine (EMLA) cream Apply 1 application topically as needed. 12/15/17   Earlie Server, MD  mupirocin ointment (BACTROBAN) 2 % Apply 1 application topically 2 (two) times daily as needed. 10/06/17   Glean Hess, MD  omeprazole (PRILOSEC) 20 MG capsule Take 1 capsule by mouth once daily 10/18/18   Earlie Server, MD  terazosin (HYTRIN) 1 MG capsule  Take 1 capsule (1 mg total) by mouth at bedtime. 01/24/17   Plonk, Gwyndolyn Saxon, MD  prochlorperazine (COMPAZINE) 10 MG tablet Take 1 tablet (10 mg total) by mouth every 6 (six) hours as needed (Nausea or vomiting). 10/11/16 06/30/17  Earlie Server, MD    Allergies Patient has no known allergies.  Family History  Problem Relation Age of Onset  . Lymphoma Mother   . Diabetes Mother   . Lung cancer Sister   . Lung cancer Brother   . Lung cancer  Brother     Social History Social History   Tobacco Use  . Smoking status: Former Smoker    Packs/day: 1.00    Years: 15.00    Pack years: 15.00    Types: Cigarettes    Quit date: 09/28/1992    Years since quitting: 26.3  . Smokeless tobacco: Never Used  Substance Use Topics  . Alcohol use: No  . Drug use: No      Review of Systems  Constitutional: Patient has fever.  Eyes: No visual changes. No discharge ENT: Patient has congestion.  Cardiovascular: no chest pain. Respiratory: Patient has cough.  Gastrointestinal: No abdominal pain.  No nausea, no vomiting. Patient had diarrhea.  Genitourinary: Negative for dysuria. No hematuria Musculoskeletal: Patient has myalgias.  Skin: Negative for rash, abrasions, lacerations, ecchymosis. Neurological: Patient has headache, no focal weakness or numbness.     ____________________________________________   PHYSICAL EXAM:  VITAL SIGNS: ED Triage Vitals  Enc Vitals Group     BP 02/04/19 2054 133/62     Pulse Rate 02/04/19 2054 92     Resp 02/04/19 2054 20     Temp 02/04/19 2054 100.2 F (37.9 C)     Temp Source 02/04/19 2054 Oral     SpO2 02/04/19 2054 95 %     Weight 02/04/19 2056 260 lb (117.9 kg)     Height 02/04/19 2056 5\' 11"  (1.803 m)     Head Circumference --      Peak Flow --      Pain Score 02/04/19 2056 0     Pain Loc --      Pain Edu? --      Excl. in [place]? --     Constitutional: Alert and oriented. Patient is lying supine. Eyes: Conjunctivae are normal. PERRL. EOMI. Head: Atraumatic. ENT:      Ears: Tympanic membranes are mildly injected with mild effusion bilaterally.       Nose: No congestion/rhinnorhea.      Mouth/Throat: Mucous membranes are moist. Posterior pharynx is mildly erythematous.  Hematological/Lymphatic/Immunilogical: No cervical lymphadenopathy.  Cardiovascular: Normal rate, regular rhythm. Normal S1 and S2.  Good peripheral circulation. Respiratory: Normal respiratory effort without  tachypnea or retractions. Lungs CTAB. Good air entry to the bases with no decreased or absent breath sounds. Gastrointestinal: Bowel sounds 4 quadrants. Soft and nontender to palpation. No guarding or rigidity. No palpable masses. No distention. No CVA tenderness. Musculoskeletal: Full range of motion to all extremities. No gross deformities appreciated. Neurologic:  Normal speech and language. No gross focal neurologic deficits are appreciated.  Skin:  Skin is warm, dry and intact. No rash noted. Psychiatric: Mood and affect are normal. Speech and behavior are normal. Patient exhibits appropriate insight and judgement.     ____________________________________________   LABS (all labs ordered are listed, but only abnormal results are displayed)  Labs Reviewed  CULTURE, BLOOD (ROUTINE X 2)  CULTURE, BLOOD (ROUTINE X 2)  CBC WITH DIFFERENTIAL/PLATELET  COMPREHENSIVE  METABOLIC PANEL  LACTIC ACID, PLASMA  LACTIC ACID, PLASMA  URINALYSIS, COMPLETE (UACMP) WITH MICROSCOPIC  POC SARS CORONAVIRUS 2 AG -  ED   ____________________________________________  EKG   ____________________________________________  RADIOLOGY Unk Pinto, personally viewed and evaluated these images (plain radiographs) as part of my medical decision making, as well as reviewing the written report by the radiologist.  DG Chest 2 View  Result Date: 02/04/2019 CLINICAL DATA:  73 year old male with fever and cough. EXAM: CHEST - 2 VIEW COMPARISON:  Chest radiograph dated 10/20/2017. FINDINGS: Right-sided Port-A-Cath in similar position. There is an area of density in the left upper lobe similar to prior radiograph, likely scarring. No consolidative changes. There is no pleural effusion or pneumothorax. Stable cardiac silhouette. No acute osseous pathology. IMPRESSION: 1. No acute cardiopulmonary process. 2. Stable left upper lobe scarring. Electronically Signed   By: Anner Crete M.D.   On: 02/04/2019 18:47     ____________________________________________    PROCEDURES  Procedure(s) performed:     Procedures     Medications - No data to display   ____________________________________________   INITIAL IMPRESSION / ASSESSMENT AND PLAN / ED COURSE  Pertinent labs & imaging results that were available during my care of the patient were reviewed by me and considered in my medical decision making (see chart for details).      Assessment and plan: SIRs Weakness  73 year old male presents to the emergency department with chills, fever, body aches, weakness, and diarrhea and cough for the past 4 days.  Patient was febrile at 103 degrees when he initially presented to urgent care.  Heart rate was in the 90s and patient was satting at 95% on room air.  On physical exam, patient was lying supine and seemed chilled.  He requested a blanket multiple times and said that he felt "so bad".  Chest x-ray was reviewed from urgent care and revealed no consolidations, opacities or infiltrates to suggest pneumonia.  No leukocytosis on CBC.  Rapid COVID-19 testing was negative.  Creatinine was mildly elevated from previous blood work.  Given patient's history of malignancy and current presenting symptoms, patient meets admission criteria for SIRS as well as weakness.  Prime doc was consulted who accepted patient for admission.  ____________________________________________  FINAL CLINICAL IMPRESSION(S) / ED DIAGNOSES  Final diagnoses:  None      NEW MEDICATIONS STARTED DURING THIS VISIT:  ED Discharge Orders    None          This chart was dictated using voice recognition software/Dragon. Despite best efforts to proofread, errors can occur which can change the meaning. Any change was purely unintentional.     Lannie Fields, PA-C 02/04/19 [id number]    Harvest Dark, MD 02/06/19 [phone]

## 2019-02-04 NOTE — ED Triage Notes (Signed)
Pt states he started Friday with "weak eyes" and "weak stomach". Endorses n/v/d. Last vomited yesterday. Complains of cough.

## 2019-02-04 NOTE — ED Triage Notes (Signed)
Patient ambulatory to triage with steady gait, without difficulty or distress noted, mask in place ; pt reports since Friday having nonprod cough and fever; pt reports having COVID swab at [facility] which is pending and CXR which is negative; pt denies any further c/o but st was sent over by [place] "because we have the treatment for it"; pt has hx lung CA but is no longer receiving treatment and st that he is in remission

## 2019-02-04 NOTE — ED Provider Notes (Signed)
[facility]    CSN: [id number] Arrival date & time: 02/04/19  1716  History   Chief Complaint Chief Complaint  Patient presents with  . Nausea  . Cough   HPI  73 year old male presents with the above complaints.  Patient states that he has not been feeling well since Friday.  He reports ongoing cough.  This appears to be a chronic complaint per the electronic medical record.  Patient states that he has "weak eyes" and a "weak stomach".  Patient reports nausea.  He has had some vomiting.  Last episode was yesterday.  He has not checked his temperature at home.  He is currently febrile at 103.  Denies shortness of breath.  No reported sick contacts.  No known exacerbating or relieving factors.  He also reports associated fatigue.  He states that he feels very poorly.  No other complaints or concerns at this time.  PMH, Surgical Hx, Family Hx, Social History reviewed and updated as below.  PMH: Patient Active Problem List   Diagnosis Date Noted  . Atherosclerosis of aorta ([place]) 06/24/2018  . Benign essential HTN 03/29/2018  . Coronary artery disease of native artery of native heart with stable angina pectoris ([place]) 03/27/2018  . Mixed hyperlipidemia 10/06/2017  . Allergic rhinitis 10/06/2017  . Encounter for antineoplastic immunotherapy 02/10/2017  . Prediabetes 01/25/2017  . BPH (benign prostatic hyperplasia) 10/21/2016  . Squamous cell lung cancer, left ([place]) 09/07/2016  . B12 deficiency 09/07/2016  . Vitamin D deficiency 09/07/2016  . Obesity (BMI 30-39.9) 09/07/2016  . Colon polyps 09/07/2016    Past Surgical History:  Procedure Laterality Date  . PORTACATH PLACEMENT Right 10/05/2016   Procedure: INSERTION PORT-A-CATH;  Surgeon: [name], MD;  Location: [facility] ORS;  Service: General;  Laterality: Right;       Home Medications    Prior to Admission medications   Medication Sig Start Date End Date Taking? Authorizing Provider  acetaminophen  (TYLENOL) 500 MG tablet Take 1,000 mg by mouth every 4 (four) hours as needed.     [provider]  albuterol (PROVENTIL HFA;VENTOLIN HFA) 108 (90 Base) MCG/ACT inhaler Inhale 2 puffs into the lungs every 6 (six) hours as needed for wheezing or shortness of breath. 04/28/17   Earlie Server, MD  atorvastatin (LIPITOR) 10 MG tablet Take 1 tablet (10 mg total) by mouth daily. 01/14/19   Glean Hess, MD  Cholecalciferol (VITAMIN D3) 2000 units capsule Take 1 capsule (2,000 Units total) by mouth daily. 09/08/16   Plonk, Gwyndolyn Saxon, MD  cyanocobalamin 500 MCG tablet Take 500 mcg by mouth daily.    [provider]  fluticasone (FLONASE) 50 MCG/ACT nasal spray Place 2 sprays into both nostrils daily. 01/14/19   Glean Hess, MD  fluticasone (FLOVENT HFA) 44 MCG/ACT inhaler Inhale 2 puffs into the lungs 2 (two) times daily. 04/28/17   Earlie Server, MD  lidocaine-prilocaine (EMLA) cream Apply 1 application topically as needed. 12/15/17   Earlie Server, MD  mupirocin ointment (BACTROBAN) 2 % Apply 1 application topically 2 (two) times daily as needed. 10/06/17   Glean Hess, MD  omeprazole (PRILOSEC) 20 MG capsule Take 1 capsule by mouth once daily 10/18/18   Earlie Server, MD  terazosin (HYTRIN) 1 MG capsule Take 1 capsule (1 mg total) by mouth at bedtime. 01/24/17   Plonk, Gwyndolyn Saxon, MD  prochlorperazine (COMPAZINE) 10 MG tablet Take 1 tablet (10 mg total) by mouth every 6 (six) hours as needed (Nausea or vomiting). 10/11/16  06/30/17  Earlie Server, MD    Family History Family History  Problem Relation Age of Onset  . Lymphoma Mother   . Diabetes Mother   . Lung cancer Sister   . Lung cancer Brother   . Lung cancer Brother     Social History Social History   Tobacco Use  . Smoking status: Former Smoker    Packs/day: 1.00    Years: 15.00    Pack years: 15.00    Types: Cigarettes    Quit date: 09/28/1992    Years since quitting: 26.3  . Smokeless tobacco: Never Used  Substance Use Topics  .  Alcohol use: No  . Drug use: No     Allergies   Patient has no known allergies.   Review of Systems Review of Systems  Constitutional: Positive for fatigue and fever.  Respiratory: Positive for cough.   Gastrointestinal: Positive for nausea and vomiting.   Physical Exam Triage Vital Signs ED Triage Vitals  Enc Vitals Group     BP 02/04/19 1750 139/69     Pulse Rate 02/04/19 1750 93     Resp 02/04/19 1750 20     Temp 02/04/19 1750 (!) 103 F (39.4 C)     Temp Source 02/04/19 1750 Oral     SpO2 02/04/19 1750 94 %     Weight 02/04/19 1755 260 lb (117.9 kg)     Height 02/04/19 1755 5\' 11"  (1.803 m)     Head Circumference --      Peak Flow --      Pain Score 02/04/19 1755 0     Pain Loc --      Pain Edu? --      Excl. in [place]? --    Updated Vital Signs BP 139/69 (BP Location: Left Arm)   Pulse 93   Temp (!) 103 F (39.4 C) (Oral)   Resp 20   Ht 5\' 11"  (1.803 m)   Wt 117.9 kg   SpO2 94%   BMI 36.26 kg/m   Visual Acuity Right Eye Distance:   Left Eye Distance:   Bilateral Distance:    Right Eye Near:   Left Eye Near:    Bilateral Near:     Physical Exam Vitals and nursing note reviewed.  Constitutional:      Appearance: He is obese.     Comments: Appears fatigued.  Appears ill.  No apparent distress at this time.  HENT:     Head: Normocephalic and atraumatic.  Eyes:     General:        Right eye: No discharge.        Left eye: No discharge.     Conjunctiva/sclera: Conjunctivae normal.  Cardiovascular:     Rate and Rhythm: Normal rate and regular rhythm.  Pulmonary:     Effort: Pulmonary effort is normal.     Breath sounds: No wheezing, rhonchi or rales.  Skin:    General: Skin is warm.     Findings: No rash.  Neurological:     General: No focal deficit present.     Mental Status: He is alert.  Psychiatric:        Mood and Affect: Mood normal.        Behavior: Behavior normal.    UC Treatments / Results  Labs (all labs ordered are listed,  but only abnormal results are displayed) Labs Reviewed  SARS CORONAVIRUS 2 AG (30 MIN TAT)    EKG   Radiology DG  Chest 2 View  Result Date: 02/04/2019 CLINICAL DATA:  73 year old male with fever and cough. EXAM: CHEST - 2 VIEW COMPARISON:  Chest radiograph dated 10/20/2017. FINDINGS: Right-sided Port-A-Cath in similar position. There is an area of density in the left upper lobe similar to prior radiograph, likely scarring. No consolidative changes. There is no pleural effusion or pneumothorax. Stable cardiac silhouette. No acute osseous pathology. IMPRESSION: 1. No acute cardiopulmonary process. 2. Stable left upper lobe scarring. Electronically Signed   By: Anner Crete M.D.   On: 02/04/2019 18:47    Procedures Procedures (including critical care time)  Medications Ordered in UC Medications  acetaminophen (TYLENOL) tablet 650 mg (650 mg Oral Given 02/04/19 1801)    Initial Impression / Assessment and Plan / UC Course  I have reviewed the triage vital signs and the nursing notes.  Pertinent labs & imaging results that were available during my care of the patient were reviewed by me and considered in my medical decision making (see chart for details).    73 year old male presents with suspected COVID-19.  Rapid Covid test negative today.  I clinically suspect that he has COVID-19.  Overall ill-appearing.  Pulse ox 92% to 94% on room air.  Chest x-ray negative.  Given overall appearance, comorbidity, and low normal pulse ox I have advised him to go to the ER for further evaluation and management.  I believe that he needs to be admitted to the hospital for further monitoring.  Final Clinical Impressions(s) / UC Diagnoses   Final diagnoses:  Suspected COVID-19 virus infection     Discharge Instructions     Please go to the hospital.  Take care  Dr. Lacinda Axon    ED Prescriptions    None     PDMP not reviewed this encounter.   Coral Spikes, [place] 02/04/19 1954

## 2019-02-04 NOTE — Discharge Instructions (Signed)
Please go to the hospital.  Take care  Dr. Lacinda Axon

## 2019-02-04 NOTE — H&P (Signed)
History and Physical   RISHAB STOUDT ONG:[id number] DOB: 01/27/1946 DOA: 02/04/2019  Referring MD/NP/PA: Jossie Ng  PCP: Glean Hess, MD   Patient coming from: Home  Chief Complaint: Fever  HPI: [name] is a 73 y.o. male with medical history significant of squamous cell carcinoma of the lung status post treatment since 2018, BPH, prediabetic, morbid obesity, vitamin D deficiency, vitamin B-12 deficiency, hypertension, GERD, coronary artery disease among other things who came to the ER with fever in the last 1 to 2 days.  Denied any sick contacts.  No exposure to COVID-19.  Patient seen and evaluated.  No obvious source of his fever and COVID-19 screen so far negative.  Patient also had normal urine analysis.  Normal chest x-ray.  At this point patient is being admitted for high fever SIRS of unknown source...  ED Course: Temperature 103.  Blood pressure 139/69 pulse 93 respirate 20 oxygen sat 94% room air.  White count 5.6 hemoglobin 14.1 platelets 122.  Sodium 134 potassium 3.8 chloride 100 CO2 23 BUN 18 creatinine 1.40 calcium 8.3 glucose 189 lactic acid 1.7.  Chest x-ray and urinalysis were negative.  COVID-19 screen negative.  Patient admitted with fever of unknown cause.  Review of Systems: As per HPI otherwise 10 point review of systems negative.    Past Medical History:  Diagnosis Date  . Enlarged prostate   . GERD (gastroesophageal reflux disease)   . Squamous cell lung cancer, left ([place]) 08/2016    Past Surgical History:  Procedure Laterality Date  . NO PAST SURGERIES    . PORTACATH PLACEMENT Right 10/05/2016   Procedure: INSERTION PORT-A-CATH;  Surgeon: [name], MD;  Location: [facility] ORS;  Service: General;  Laterality: Right;     reports that he quit smoking about 26 years ago. His smoking use included cigarettes. He has a 15.00 pack-year smoking history. He has never used smokeless tobacco. He reports that he does not drink alcohol or use  drugs.  No Known Allergies  Family History  Problem Relation Age of Onset  . Lymphoma Mother   . Diabetes Mother   . Lung cancer Sister   . Lung cancer Brother   . Lung cancer Brother      Prior to Admission medications   Medication Sig Start Date End Date Taking? Authorizing Provider  acetaminophen (TYLENOL) 500 MG tablet Take 1,000 mg by mouth every 4 (four) hours as needed.     [provider]  albuterol (PROVENTIL HFA;VENTOLIN HFA) 108 (90 Base) MCG/ACT inhaler Inhale 2 puffs into the lungs every 6 (six) hours as needed for wheezing or shortness of breath. 04/28/17   Earlie Server, MD  atorvastatin (LIPITOR) 10 MG tablet Take 1 tablet (10 mg total) by mouth daily. 01/14/19   Glean Hess, MD  Cholecalciferol (VITAMIN D3) 2000 units capsule Take 1 capsule (2,000 Units total) by mouth daily. 09/08/16   Plonk, Gwyndolyn Saxon, MD  cyanocobalamin 500 MCG tablet Take 500 mcg by mouth daily.    [provider]  fluticasone (FLONASE) 50 MCG/ACT nasal spray Place 2 sprays into both nostrils daily. 01/14/19   Glean Hess, MD  fluticasone (FLOVENT HFA) 44 MCG/ACT inhaler Inhale 2 puffs into the lungs 2 (two) times daily. 04/28/17   Earlie Server, MD  lidocaine-prilocaine (EMLA) cream Apply 1 application topically as needed. 12/15/17   Earlie Server, MD  mupirocin ointment (BACTROBAN) 2 % Apply 1 application topically 2 (two) times daily as needed. 10/06/17   Halina Maidens  H, MD  omeprazole (PRILOSEC) 20 MG capsule Take 1 capsule by mouth once daily 10/18/18   Earlie Server, MD  terazosin (HYTRIN) 1 MG capsule Take 1 capsule (1 mg total) by mouth at bedtime. 01/24/17   Plonk, Gwyndolyn Saxon, MD  prochlorperazine (COMPAZINE) 10 MG tablet Take 1 tablet (10 mg total) by mouth every 6 (six) hours as needed (Nausea or vomiting). 10/11/16 06/30/17  Earlie Server, MD    Physical Exam: Vitals:   02/04/19 2054 02/04/19 2056  BP: 133/62   Pulse: 92   Resp: 20   Temp: 100.2 F (37.9 C)   TempSrc: Oral   SpO2: 95%    Weight:  117.9 kg  Height:  5' 11" (1.803 m)      Constitutional: NAD, calm, comfortable Vitals:   02/04/19 2054 02/04/19 2056  BP: 133/62   Pulse: 92   Resp: 20   Temp: 100.2 F (37.9 C)   TempSrc: Oral   SpO2: 95%   Weight:  117.9 kg  Height:  5' 11" (1.803 m)   Eyes: PERRL, lids and conjunctivae normal ENMT: Mucous membranes are moist. Posterior pharynx clear of any exudate or lesions.Normal dentition.  Neck: normal, supple, no masses, no thyromegaly Respiratory: clear to auscultation bilaterally, no wheezing, no crackles. Normal respiratory effort. No accessory muscle use.  Cardiovascular: Regular rate and rhythm, no murmurs / rubs / gallops. No extremity edema. 2+ pedal pulses. No carotid bruits.  Abdomen: no tenderness, no masses palpated. No hepatosplenomegaly. Bowel sounds positive.  Musculoskeletal: no clubbing / cyanosis. No joint deformity upper and lower extremities. Good ROM, no contractures. Normal muscle tone.  Skin: no rashes, lesions, ulcers. No induration Neurologic: CN 2-12 grossly intact. Sensation intact, DTR normal. Strength 5/5 in all 4.  Psychiatric: Normal judgment and insight. Alert and oriented x 3. Normal mood.     Labs on Admission: I have personally reviewed following labs and imaging studies  CBC: Recent Labs  Lab 02/04/19 2243  WBC 5.6  NEUTROABS 4.5  HGB 14.2  HCT 42.2  MCV 82.7  PLT 607*   Basic Metabolic Panel: Recent Labs  Lab 02/04/19 2243  NA 134*  K 3.8  CL 100  CO2 23  GLUCOSE 118*  BUN 18  CREATININE 1.42*  CALCIUM 8.6*   GFR: Estimated Creatinine Clearance: 61.4 mL/min (A) (by C-G formula based on SCr of 1.42 mg/dL (H)). Liver Function Tests: Recent Labs  Lab 02/04/19 2243  AST 43*  ALT 31  ALKPHOS 53  BILITOT 0.8  PROT 8.4*  ALBUMIN 4.0   No results for input(s): LIPASE, AMYLASE in the last 168 hours. No results for input(s): AMMONIA in the last 168 hours. Coagulation Profile: No results for  input(s): INR, PROTIME in the last 168 hours. Cardiac Enzymes: No results for input(s): CKTOTAL, CKMB, CKMBINDEX, TROPONINI in the last 168 hours. BNP (last 3 results) No results for input(s): PROBNP in the last 8760 hours. HbA1C: No results for input(s): HGBA1C in the last 72 hours. CBG: No results for input(s): GLUCAP in the last 168 hours. Lipid Profile: No results for input(s): CHOL, HDL, LDLCALC, TRIG, CHOLHDL, LDLDIRECT in the last 72 hours. Thyroid Function Tests: No results for input(s): TSH, T4TOTAL, FREET4, T3FREE, THYROIDAB in the last 72 hours. Anemia Panel: No results for input(s): VITAMINB12, FOLATE, FERRITIN, TIBC, IRON, RETICCTPCT in the last 72 hours. Urine analysis:    Component Value Date/Time   COLORURINE YELLOW (A) 02/04/2019 2232   APPEARANCEUR HAZY (A) 02/04/2019 2232   LABSPEC  1.020 02/04/2019 2232   PHURINE 5.0 02/04/2019 2232   GLUCOSEU NEGATIVE 02/04/2019 2232   HGBUR NEGATIVE 02/04/2019 2232   BILIRUBINUR NEGATIVE 02/04/2019 2232   KETONESUR NEGATIVE 02/04/2019 2232   PROTEINUR 30 (A) 02/04/2019 2232   NITRITE NEGATIVE 02/04/2019 2232   LEUKOCYTESUR NEGATIVE 02/04/2019 2232   Sepsis Labs: _0 (procalcitonin:4,lacticidven:4) ) Recent Results (from the past 240 hour(s))  SARS Coronavirus 2 Ag (30 min TAT) - Nasal Swab (BD Veritor Kit)     Status: None   Collection Time: 02/04/19  5:56 PM   Specimen: Nasal Swab (BD Veritor Kit)  Result Value Ref Range Status   SARS Coronavirus 2 Ag NEGATIVE NEGATIVE Final    Comment: (NOTE) SARS-CoV-2 antigen NOT DETECTED.  Negative results are presumptive.  Negative results do not preclude SARS-CoV-2 infection and should not be used as the sole basis for treatment or other patient management decisions, including infection  control decisions, particularly in the presence of clinical signs and  symptoms consistent with COVID-19, or in those who have been in contact with the virus.  Negative results must be  combined with clinical observations, patient history, and epidemiological information. The expected result is Negative. Fact Sheet for Patients: PodPark.tn Fact Sheet for Healthcare Providers: GiftContent.is This test is not yet approved or cleared by the [place] FDA and  has been authorized for detection and/or diagnosis of SARS-CoV-2 by FDA under an Emergency Use Authorization (EUA).  This EUA will remain in effect (meaning this test can be used) for the duration of  the COVID-19 de claration under Section 564(b)(1) of the Act, 21 U.S.C. section 360bbb-3(b)(1), unless the authorization is terminated or revoked sooner. Performed at [facility], [address] [place], [place] [address]      Radiological Exams on Admission: DG Chest 2 View  Result Date: 02/04/2019 CLINICAL DATA:  73 year old male with fever and cough. EXAM: CHEST - 2 VIEW COMPARISON:  Chest radiograph dated 10/20/2017. FINDINGS: Right-sided Port-A-Cath in similar position. There is an area of density in the left upper lobe similar to prior radiograph, likely scarring. No consolidative changes. There is no pleural effusion or pneumothorax. Stable cardiac silhouette. No acute osseous pathology. IMPRESSION: 1. No acute cardiopulmonary process. 2. Stable left upper lobe scarring. Electronically Signed   By: Anner Crete M.D.   On: 02/04/2019 18:47      Assessment/Plan Principal Problem:   SIRS (systemic inflammatory response syndrome) (HCC) Active Problems:   Obesity (BMI 30-39.9)   BPH (benign prostatic hyperplasia)   Mixed hyperlipidemia   Coronary artery disease of native artery of native heart with stable angina pectoris (HCC)   Benign essential HTN   GERD (gastroesophageal reflux disease)     #1 fever and borderline SIRS: No obvious source of infection.  Patient's temperature elevation could be due to some other viral illness.   He has no leukocytosis.  Empirically therefore will initiate Zosyn while blood cultures are obtained.  He has had previous cancer history which has been treated.  He could have a recurrence of his cancer or other malignancy like lymphoma.  No obvious source on x-ray at the moment.  Supportive care will be employed.  May need to get him back to oncology.  #2 hypertension: Continue blood pressure control using home regimen.  #3 coronary artery disease: Stable at baseline.  #4 hyperlipidemia: Continue with statin  #5 GERD: Continue PPI  #6 history of BPH: Continue home regimen.  #7 acute kidney injury: Hydrate patient and follow  renal function.  #8 thrombocytopenia: Mild.  Follow platelets and be careful with Lovenox.   DVT prophylaxis: Lovenox Code Status: Full code Family Communication: No family at bedside Disposition Plan: Home Consults called: None Admission status: Observation  Severity of Illness: The appropriate patient status for this patient is OBSERVATION. Observation status is judged to be reasonable and necessary in order to provide the required intensity of service to ensure the patient's safety. The patient's presenting symptoms, physical exam findings, and initial radiographic and laboratory data in the context of their medical condition is felt to place them at decreased risk for further clinical deterioration. Furthermore, it is anticipated that the patient will be medically stable for discharge from the hospital within 2 midnights of admission. The following factors support the patient status of observation.   " The patient's presenting symptoms include fever. " The physical exam findings include no obvious findings on exam. " The initial radiographic and laboratory data are platelets of 122 and renal insufficiency.     Barbette Merino MD [facility] Pager [phone]  If 7PM-7AM, please contact night-coverage [email or web address] Password [facility]  02/04/2019,  11:53 PM

## 2019-02-04 NOTE — ED Notes (Signed)
PT denies SOB/ CP

## 2019-02-04 NOTE — ED Notes (Signed)
This Rn attempted IV access x2

## 2019-02-05 ENCOUNTER — Encounter: Payer: Self-pay | Admitting: Internal Medicine

## 2019-02-05 DIAGNOSIS — R509 Fever, unspecified: Secondary | ICD-10-CM | POA: Diagnosis present

## 2019-02-05 DIAGNOSIS — N400 Enlarged prostate without lower urinary tract symptoms: Secondary | ICD-10-CM

## 2019-02-05 DIAGNOSIS — R6510 Systemic inflammatory response syndrome (SIRS) of non-infectious origin without acute organ dysfunction: Secondary | ICD-10-CM

## 2019-02-05 DIAGNOSIS — I10 Essential (primary) hypertension: Secondary | ICD-10-CM

## 2019-02-05 DIAGNOSIS — J45909 Unspecified asthma, uncomplicated: Secondary | ICD-10-CM

## 2019-02-05 LAB — COMPREHENSIVE METABOLIC PANEL
ALT: 28 U/L (ref 0–44)
AST: 45 U/L — ABNORMAL HIGH (ref 15–41)
Albumin: 3.6 g/dL (ref 3.5–5.0)
Alkaline Phosphatase: 46 U/L (ref 38–126)
Anion gap: 9 (ref 5–15)
BUN: 20 mg/dL (ref 8–23)
CO2: 23 mmol/L (ref 22–32)
Calcium: 8.3 mg/dL — ABNORMAL LOW (ref 8.9–10.3)
Chloride: 101 mmol/L (ref 98–111)
Creatinine, Ser: 1.46 mg/dL — ABNORMAL HIGH (ref 0.61–1.24)
GFR calc Af Amer: 55 mL/min — ABNORMAL LOW (ref >60)
GFR calc non Af Amer: 47 mL/min — ABNORMAL LOW (ref >60)
Glucose, Bld: 115 mg/dL — ABNORMAL HIGH (ref 70–99)
Potassium: 4.2 mmol/L (ref 3.5–5.1)
Sodium: 133 mmol/L — ABNORMAL LOW (ref 135–145)
Total Bilirubin: 0.9 mg/dL (ref 0.3–1.2)
Total Protein: 7.4 g/dL (ref 6.5–8.1)

## 2019-02-05 LAB — CBC
HCT: 39.3 % (ref 39.0–52.0)
Hemoglobin: 13.4 g/dL (ref 13.0–17.0)
MCH: 27.9 pg (ref 26.0–34.0)
MCHC: 34.1 g/dL (ref 30.0–36.0)
MCV: 81.9 fL (ref 80.0–100.0)
Platelets: 109 10*3/uL — ABNORMAL LOW (ref 150–400)
RBC: 4.8 MIL/uL (ref 4.22–5.81)
RDW: 13.3 % (ref 11.5–15.5)
WBC: 5.2 10*3/uL (ref 4.0–10.5)
nRBC: 0 % (ref 0.0–0.2)

## 2019-02-05 LAB — PROCALCITONIN: Procalcitonin: 0.12 ng/mL

## 2019-02-05 MED ORDER — ACETAMINOPHEN 650 MG RE SUPP: 650.0000 mg | Freq: Four times a day (QID) | RECTAL | Status: DC | PRN

## 2019-02-05 MED ORDER — SODIUM CHLORIDE 0.9 % IV SOLN: INTRAVENOUS | Status: DC

## 2019-02-05 MED ORDER — VITAMIN B-12 1000 MCG PO TABS
500.0000 ug | ORAL_TABLET | Freq: Every day | ORAL | Status: DC
  Administered 2019-02-05 – 2019-02-09 (×5): 500 ug via ORAL
  Filled 2019-02-05 (×5): qty 1

## 2019-02-05 MED ORDER — ONDANSETRON HCL 4 MG PO TABS: 4.0000 mg | ORAL_TABLET | Freq: Four times a day (QID) | ORAL | Status: DC | PRN

## 2019-02-05 MED ORDER — ACETAMINOPHEN 325 MG PO TABS
650.0000 mg | ORAL_TABLET | Freq: Four times a day (QID) | ORAL | Status: DC | PRN
  Administered 2019-02-05 – 2019-02-06 (×6): 650 mg via ORAL
  Filled 2019-02-05 (×6): qty 2

## 2019-02-05 MED ORDER — GUAIFENESIN 100 MG/5ML PO SOLN
5.0000 mL | ORAL | Status: DC | PRN
  Filled 2019-02-05: qty 5

## 2019-02-05 MED ORDER — PIPERACILLIN-TAZOBACTAM 3.375 G IVPB
3.3750 g | Freq: Three times a day (TID) | INTRAVENOUS | Status: DC
  Administered 2019-02-05 – 2019-02-06 (×5): 3.375 g via INTRAVENOUS
  Filled 2019-02-05 (×5): qty 50

## 2019-02-05 MED ORDER — PANTOPRAZOLE SODIUM 40 MG PO TBEC
40.0000 mg | DELAYED_RELEASE_TABLET | Freq: Every day | ORAL | Status: DC
  Administered 2019-02-05 – 2019-02-09 (×5): 40 mg via ORAL
  Filled 2019-02-05 (×5): qty 1

## 2019-02-05 MED ORDER — BUDESONIDE 0.25 MG/2ML IN SUSP
0.2500 mg | Freq: Two times a day (BID) | RESPIRATORY_TRACT | Status: DC
  Administered 2019-02-05 – 2019-02-06 (×2): 0.25 mg via RESPIRATORY_TRACT
  Filled 2019-02-05 (×3): qty 2

## 2019-02-05 MED ORDER — ATORVASTATIN CALCIUM 10 MG PO TABS
10.0000 mg | ORAL_TABLET | Freq: Every day | ORAL | Status: DC
  Administered 2019-02-05 – 2019-02-09 (×5): 10 mg via ORAL
  Filled 2019-02-05 (×5): qty 1

## 2019-02-05 MED ORDER — ALBUTEROL SULFATE (2.5 MG/3ML) 0.083% IN NEBU: 2.5000 mg | INHALATION_SOLUTION | Freq: Four times a day (QID) | RESPIRATORY_TRACT | Status: DC | PRN

## 2019-02-05 MED ORDER — ASPIRIN EC 81 MG PO TBEC
81.0000 mg | DELAYED_RELEASE_TABLET | Freq: Every day | ORAL | Status: DC
  Administered 2019-02-05 – 2019-02-09 (×5): 81 mg via ORAL
  Filled 2019-02-05 (×5): qty 1

## 2019-02-05 MED ORDER — TERAZOSIN HCL 1 MG PO CAPS
1.0000 mg | ORAL_CAPSULE | Freq: Every day | ORAL | Status: DC
  Administered 2019-02-05 – 2019-02-08 (×4): 1 mg via ORAL
  Filled 2019-02-05 (×6): qty 1

## 2019-02-05 MED ORDER — ALBUTEROL SULFATE HFA 108 (90 BASE) MCG/ACT IN AERS: 2.0000 | INHALATION_SPRAY | Freq: Four times a day (QID) | RESPIRATORY_TRACT | Status: DC | PRN

## 2019-02-05 MED ORDER — ONDANSETRON HCL 4 MG/2ML IJ SOLN: 4.0000 mg | Freq: Four times a day (QID) | INTRAMUSCULAR | Status: DC | PRN

## 2019-02-05 MED ORDER — FLUTICASONE PROPIONATE 50 MCG/ACT NA SUSP
2.0000 | Freq: Every day | NASAL | Status: DC
  Administered 2019-02-07 – 2019-02-09 (×3): 2 via NASAL
  Filled 2019-02-05: qty 16

## 2019-02-05 MED ORDER — VITAMIN D 25 MCG (1000 UNIT) PO TABS
2000.0000 [IU] | ORAL_TABLET | Freq: Every day | ORAL | Status: DC
  Administered 2019-02-05 – 2019-02-09 (×5): 2000 [IU] via ORAL
  Filled 2019-02-05 (×5): qty 2

## 2019-02-05 MED ORDER — ENOXAPARIN SODIUM 40 MG/0.4ML ~~LOC~~ SOLN
40.0000 mg | Freq: Every day | SUBCUTANEOUS | Status: DC
  Administered 2019-02-05 – 2019-02-08 (×5): 40 mg via SUBCUTANEOUS
  Filled 2019-02-05 (×5): qty 0.4

## 2019-02-05 NOTE — Progress Notes (Signed)
PROGRESS NOTE    [name]  DPO:[id number] DOB: [birth year] DOA: 02/04/2019  PCP: Glean Hess, MD    LOS - 1   Brief Narrative:  73 y.o. male with medical history significant of squamous cell carcinoma of the lung status post treatment since 2018, BPH, prediabetic, morbid obesity, vitamin D deficiency, vitamin B-12 deficiency, hypertension, GERD, coronary artery disease among other things who came to the ER with fever for the last 1 to 2 days.  Denied any sick contacts or known COVID-19 exposure.  Reported having sinus issues last week that have resolved, no new or worsening symptoms aside from fever.  In the ED, temp 103 F, HR 93, otherwise stable vitals.  CXR and UA normal, Covid-19 PCR negative.  No leukocytosis.   Patient was admitted for fever of unknown origin and started on Zosyn empirically. Blood cultures pending.   Subjective 12/22: Patient seen this AM in the ED.  Says he feels the same.  Asks if he can go home today.  Advised he stay until at least preliminary culture data is back and continued antibiotics.  Stated "this is just miserable".  Said he would think about staying vs leaving AMA.  Denies any new or worsening symptoms.   Assessment & Plan:   Principal Problem:   Fever of unknown origin (FUO) Active Problems:   SIRS (systemic inflammatory response syndrome) (HCC)   Coronary artery disease of native artery of native heart with stable angina pectoris (HCC)   Benign essential HTN   Asthma   Obesity (BMI 30-39.9)   BPH (benign prostatic hyperplasia)   Mixed hyperlipidemia   GERD (gastroesophageal reflux disease)   Fever of unknown origin (FUO) - present on admission SIRS (systemic inflammatory response syndrome) - present on admission, resolved So far no source of infection identified.  CXR, UA and Covid-19 negative.  No Gi symptoms.  No leukocytosis, negative procal, normal lactate.  Suspect another viral illness causing fever, but given his history of  malignancy, some concern for recurrence or another malignancy (lymphoma, leukemia?).   --continue empiric Zosyn for now --follow up blood cultures --consider outpatient follow up with oncology if not resolving or if recurrent  Coronary artery disease of native artery of native heart with stable angina pectoris ([place]) Stable.  No active chest pain.  --continue ASA and Lipitor   Benign essential HTN Does not appear to be on antihypertensives outpatient, aside from terazosin for BPN. Normotensive at this time.  Asthma (?COPD) - not acutely exacerbated --continue albuterol inhaler --sub Pulmicort nebs BID for Flovent HFA  Obesity (BMI 30-39.9) Recommend diet & exercise for weight loss.  Outpatient follow up.  BPH (benign prostatic hyperplasia) --continue home terazosin  Mixed hyperlipidemia --continue Lipitor  GERD (gastroesophageal reflux disease) --continue PPI  DVT prophylaxis: Lovenox   Code Status: Full Code  Family Communication: none at bedside  Disposition Plan:  Expect discharge home in 1-2 days pending improvement and blood cultures.   Consultants:   None  Procedures:   None  Antimicrobials:   Zosyn, start 12/21, stop TBD pending cultures    Objective: Vitals:   02/05/19 0515 02/05/19 1025 02/05/19 1400 02/05/19 1437  BP: 123/68 131/67  111/62  Pulse: 81 80  88  Resp: _0 Temp: (!) 101.1 F (38.4 C) 99.1 F (37.3 C) (!) 101.9 F (38.8 C) 98.6 F (37 C)  TempSrc: Oral Oral Oral   SpO2: 94% 95% 94% 95%  Weight:  Height:        Intake/Output Summary (Last 24 hours) at 02/05/2019 1630 Last data filed at 02/05/2019 1600 Gross per 24 hour  Intake 1043.53 ml  Output 300 ml  Net 743.53 ml   Filed Weights   02/04/19 2056  Weight: 117.9 kg    Examination:  General exam: awake, alert, no acute distress, irritable HEENT: moist mucus membranes, hearing grossly normal  Respiratory system: clear to auscultation bilaterally, no wheezes,  rales or rhonchi, normal respiratory effort. Cardiovascular system: normal S1/S2, RRR, no JVD, murmurs, rubs, gallops, no pedal edema.   Gastrointestinal system: soft, non-tender, non-distended abdomen, no organomegaly or masses felt, normal bowel sounds. Central nervous system: alert and oriented x4. no gross focal neurologic deficits, normal speech Extremities: moves all, no edema, normal tone Skin: dry, intact, normal temperature, No rashes, lesions or ulcers Psychiatry: irritable mood, congruent affect, judgement and insight appear abnormal     Data Reviewed: I have personally reviewed following labs and imaging studies  CBC: Recent Labs  Lab 02/04/19 2243 02/05/19 0541  WBC 5.6 5.2  NEUTROABS 4.5  --   HGB 14.2 13.4  HCT 42.2 39.3  MCV 82.7 81.9  PLT 122* 696*   Basic Metabolic Panel: Recent Labs  Lab 02/04/19 2243 02/05/19 0541  NA 134* 133*  K 3.8 4.2  CL 100 101  CO2 23 23  GLUCOSE 118* 115*  BUN 18 20  CREATININE 1.42* 1.46*  CALCIUM 8.6* 8.3*   GFR: Estimated Creatinine Clearance: 59.7 mL/min (A) (by C-G formula based on SCr of 1.46 mg/dL (H)). Liver Function Tests: Recent Labs  Lab 02/04/19 2243 02/05/19 0541  AST 43* 45*  ALT 31 28  ALKPHOS 53 46  BILITOT 0.8 0.9  PROT 8.4* 7.4  ALBUMIN 4.0 3.6   No results for input(s): LIPASE, AMYLASE in the last 168 hours. No results for input(s): AMMONIA in the last 168 hours. Coagulation Profile: No results for input(s): INR, PROTIME in the last 168 hours. Cardiac Enzymes: No results for input(s): CKTOTAL, CKMB, CKMBINDEX, TROPONINI in the last 168 hours. BNP (last 3 results) No results for input(s): PROBNP in the last 8760 hours. HbA1C: No results for input(s): HGBA1C in the last 72 hours. CBG: No results for input(s): GLUCAP in the last 168 hours. Lipid Profile: No results for input(s): CHOL, HDL, LDLCALC, TRIG, CHOLHDL, LDLDIRECT in the last 72 hours. Thyroid Function Tests: No results for  input(s): TSH, T4TOTAL, FREET4, T3FREE, THYROIDAB in the last 72 hours. Anemia Panel: No results for input(s): VITAMINB12, FOLATE, FERRITIN, TIBC, IRON, RETICCTPCT in the last 72 hours. Sepsis Labs: Recent Labs  Lab 02/04/19 2241 02/05/19 0541  PROCALCITON <0.10 0.12  LATICACIDVEN 1.7  --     Recent Results (from the past 240 hour(s))  SARS Coronavirus 2 Ag (30 min TAT) - Nasal Swab (BD Veritor Kit)     Status: None   Collection Time: 02/04/19  5:56 PM   Specimen: Nasal Swab (BD Veritor Kit)  Result Value Ref Range Status   SARS Coronavirus 2 Ag NEGATIVE NEGATIVE Final    Comment: (NOTE) SARS-CoV-2 antigen NOT DETECTED.  Negative results are presumptive.  Negative results do not preclude SARS-CoV-2 infection and should not be used as the sole basis for treatment or other patient management decisions, including infection  control decisions, particularly in the presence of clinical signs and  symptoms consistent with COVID-19, or in those who have been in contact with the virus.  Negative results must be combined with  clinical observations, patient history, and epidemiological information. The expected result is Negative. Fact Sheet for Patients: PodPark.tn Fact Sheet for Healthcare Providers: GiftContent.is This test is not yet approved or cleared by the [place] FDA and  has been authorized for detection and/or diagnosis of SARS-CoV-2 by FDA under an Emergency Use Authorization (EUA).  This EUA will remain in effect (meaning this test can be used) for the duration of  the COVID-19 de claration under Section 564(b)(1) of the Act, 21 U.S.C. section 360bbb-3(b)(1), unless the authorization is terminated or revoked sooner. Performed at [facility], [address], [place], [place] [address]   Blood culture (routine x 2)     Status: None (Preliminary result)   Collection Time: 02/04/19 10:41 PM    Specimen: BLOOD  Result Value Ref Range Status   Specimen Description BLOOD RIGHT ANTECUBITAL  Final   Special Requests   Final    BOTTLES DRAWN AEROBIC AND ANAEROBIC Blood Culture results may not be optimal due to an excessive volume of blood received in culture bottles   Culture   Final    NO GROWTH < 12 HOURS Performed at [facility], [address], [place], [place] [address]    Report Status PENDING  Incomplete  Blood culture (routine x 2)     Status: None (Preliminary result)   Collection Time: 02/04/19 11:12 PM   Specimen: BLOOD  Result Value Ref Range Status   Specimen Description BLOOD BLOOD RIGHT HAND  Final   Special Requests   Final    BOTTLES DRAWN AEROBIC AND ANAEROBIC Blood Culture adequate volume   Culture   Final    NO GROWTH < 12 HOURS Performed at [facility], [address] [place], [place] [address]    Report Status PENDING  Incomplete         Radiology Studies: DG Chest 2 View  Result Date: 02/04/2019 CLINICAL DATA:  73 year old male with fever and cough. EXAM: CHEST - 2 VIEW COMPARISON:  Chest radiograph dated 10/20/2017. FINDINGS: Right-sided Port-A-Cath in similar position. There is an area of density in the left upper lobe similar to prior radiograph, likely scarring. No consolidative changes. There is no pleural effusion or pneumothorax. Stable cardiac silhouette. No acute osseous pathology. IMPRESSION: 1. No acute cardiopulmonary process. 2. Stable left upper lobe scarring. Electronically Signed   By: Anner Crete M.D.   On: 02/04/2019 18:47        Scheduled Meds: . enoxaparin (LOVENOX) injection  40 mg Subcutaneous QHS   Continuous Infusions: . sodium chloride 75 mL/hr at 02/05/19 0257  . piperacillin-tazobactam (ZOSYN)  IV 3.375 g (02/05/19 1408)     LOS: 1 day    Time spent: 35-40 minutes    Ezekiel Slocumb, DO [facility]   If 7PM-7AM, please contact night-coverage [email or web address] Password  [facility] 02/05/2019, 4:30 PM

## 2019-02-05 NOTE — ED Notes (Signed)
Pt ambulatory to bathroom with steady gait.

## 2019-02-05 NOTE — ED Notes (Signed)
Pt given lunch tray.

## 2019-02-06 ENCOUNTER — Encounter: Payer: Self-pay | Admitting: Internal Medicine

## 2019-02-06 ENCOUNTER — Inpatient Hospital Stay: Payer: Medicare HMO

## 2019-02-06 LAB — BASIC METABOLIC PANEL
Anion gap: 10 (ref 5–15)
BUN: 17 mg/dL (ref 8–23)
CO2: 19 mmol/L — ABNORMAL LOW (ref 22–32)
Calcium: 7.9 mg/dL — ABNORMAL LOW (ref 8.9–10.3)
Chloride: 105 mmol/L (ref 98–111)
Creatinine, Ser: 1.17 mg/dL (ref 0.61–1.24)
GFR calc Af Amer: >60 mL/min (ref >60)
GFR calc non Af Amer: >60 mL/min (ref >60)
Glucose, Bld: 110 mg/dL — ABNORMAL HIGH (ref 70–99)
Potassium: 3.7 mmol/L (ref 3.5–5.1)
Sodium: 134 mmol/L — ABNORMAL LOW (ref 135–145)

## 2019-02-06 LAB — SEDIMENTATION RATE: Sed Rate: 30 mm/hr — ABNORMAL HIGH (ref 0–20)

## 2019-02-06 LAB — CBC WITH DIFFERENTIAL/PLATELET
Abs Immature Granulocytes: 0.02 10*3/uL (ref 0.00–0.07)
Basophils Absolute: 0 10*3/uL (ref 0.0–0.1)
Basophils Relative: 0 %
Eosinophils Absolute: 0 10*3/uL (ref 0.0–0.5)
Eosinophils Relative: 0 %
HCT: 38.6 % — ABNORMAL LOW (ref 39.0–52.0)
Hemoglobin: 12.8 g/dL — ABNORMAL LOW (ref 13.0–17.0)
Immature Granulocytes: 0 %
Lymphocytes Relative: 9 %
Lymphs Abs: 0.4 10*3/uL — ABNORMAL LOW (ref 0.7–4.0)
MCH: 27.2 pg (ref 26.0–34.0)
MCHC: 33.2 g/dL (ref 30.0–36.0)
MCV: 82 fL (ref 80.0–100.0)
Monocytes Absolute: 0.2 10*3/uL (ref 0.1–1.0)
Monocytes Relative: 4 %
Neutro Abs: 4.4 10*3/uL (ref 1.7–7.7)
Neutrophils Relative %: 87 %
Platelets: 114 10*3/uL — ABNORMAL LOW (ref 150–400)
RBC: 4.71 MIL/uL (ref 4.22–5.81)
RDW: 13.2 % (ref 11.5–15.5)
WBC: 5 10*3/uL (ref 4.0–10.5)
nRBC: 0 % (ref 0.0–0.2)

## 2019-02-06 LAB — HEPATITIS PANEL, ACUTE
HCV Ab: NONREACTIVE
Hep A IgM: NONREACTIVE
Hep B C IgM: NONREACTIVE
Hepatitis B Surface Ag: NONREACTIVE

## 2019-02-06 LAB — INFLUENZA PANEL BY PCR (TYPE A & B)
Influenza A By PCR: NEGATIVE
Influenza B By PCR: NEGATIVE

## 2019-02-06 LAB — LACTATE DEHYDROGENASE: LDH: 311 U/L — ABNORMAL HIGH (ref 98–192)

## 2019-02-06 LAB — MAGNESIUM: Magnesium: 2.1 mg/dL (ref 1.7–2.4)

## 2019-02-06 LAB — ABO/RH: ABO/RH(D): A POS

## 2019-02-06 LAB — PROCALCITONIN: Procalcitonin: 0.1 ng/mL

## 2019-02-06 LAB — C-REACTIVE PROTEIN: CRP: 4.9 mg/dL — ABNORMAL HIGH (ref <1.0)

## 2019-02-06 MED ORDER — SODIUM CHLORIDE 0.9 % IV SOLN
100.0000 mg | Freq: Every day | INTRAVENOUS | Status: DC
  Filled 2019-02-06: qty 20

## 2019-02-06 MED ORDER — IOHEXOL 9 MG/ML PO SOLN
500.0000 mL | ORAL | Status: AC
  Administered 2019-02-06 (×2): 500 mL via ORAL

## 2019-02-06 MED ORDER — SODIUM CHLORIDE 0.9 % IV SOLN
200.0000 mg | Freq: Once | INTRAVENOUS | Status: DC
  Filled 2019-02-06: qty 40

## 2019-02-06 MED ORDER — IPRATROPIUM-ALBUTEROL 20-100 MCG/ACT IN AERS
1.0000 | INHALATION_SPRAY | Freq: Four times a day (QID) | RESPIRATORY_TRACT | Status: DC
  Administered 2019-02-06 – 2019-02-09 (×11): 1 via RESPIRATORY_TRACT
  Filled 2019-02-06 (×2): qty 4

## 2019-02-06 MED ORDER — IOHEXOL 300 MG/ML  SOLN
125.0000 mL | Freq: Once | INTRAMUSCULAR | Status: AC | PRN
  Administered 2019-02-06: 125 mL via INTRAVENOUS

## 2019-02-06 MED ORDER — ASCORBIC ACID 500 MG PO TABS
500.0000 mg | ORAL_TABLET | Freq: Every day | ORAL | Status: DC
  Administered 2019-02-07 – 2019-02-09 (×3): 500 mg via ORAL
  Filled 2019-02-06 (×3): qty 1

## 2019-02-06 MED ORDER — TRAZODONE HCL 50 MG PO TABS
50.0000 mg | ORAL_TABLET | Freq: Every evening | ORAL | Status: DC | PRN
  Administered 2019-02-06 – 2019-02-08 (×2): 50 mg via ORAL
  Filled 2019-02-06 (×2): qty 1

## 2019-02-06 MED ORDER — ZINC SULFATE 220 (50 ZN) MG PO CAPS
220.0000 mg | ORAL_CAPSULE | Freq: Every day | ORAL | Status: DC
  Administered 2019-02-06 – 2019-02-09 (×4): 220 mg via ORAL
  Filled 2019-02-06 (×4): qty 1

## 2019-02-06 NOTE — Progress Notes (Addendum)
PROGRESS NOTE    [name]  WOE:[id number] DOB: 1945/05/26 DOA: 02/04/2019  PCP: Glean Hess, MD    LOS - 2   Brief Narrative:  73 y.o.malewith medical history significant ofsquamous cell carcinoma of the lung status post treatment since 2018, BPH, prediabetic, morbid obesity, vitamin D deficiency, vitamin B-12 deficiency, hypertension, GERD, coronary artery disease among other things who came to the ER with fever for the last 1 to 2 days. Denied any sick contacts or known COVID-19 exposure. Reported having sinus issues last week that have resolved, no new or worsening symptoms aside from fever.  In the ED, temp 103 F, HR 93, otherwise stable vitals.  CXR and UA normal, Covid-19 PCR negative.  No leukocytosis.   Patient was admitted for fever of unknown origin and started on Zosyn empirically. Blood cultures pending, but no growth to date.  Continues to run fevers after over 24 hours antibiotics.  Checking flu A/B which had not yet been done, repeat peripheral blood cultures and from his port which was accessed on 12/10, inflammatory markers, quantiferon, hepatitis panel given elevated AST.  Pending above, will consider CT's of chest/abdomen/pelvis to evaluate for cancer recurrence or other malignancy.      Subjective 12/22: Patient says he feels about the same.  Did spike a fever of 101.7 again overnight.  Again asks about going home today.  Discussed plan for further workup including likely CT scans given his cancer history.  He agrees.  Denies SOB, chest pain, N/V/D or other acute complaints.  Assessment & Plan:   Principal Problem:   Fever of unknown origin (FUO) Active Problems:   SIRS (systemic inflammatory response syndrome) (HCC)   Coronary artery disease of native artery of native heart with stable angina pectoris (HCC)   Benign essential HTN   Asthma   Obesity (BMI 30-39.9)   BPH (benign prostatic hyperplasia)   Mixed hyperlipidemia   GERD (gastroesophageal  reflux disease)   Fever of unknown origin (FUO) - present on admission SIRS (systemic inflammatory response syndrome) - present on admission, resolved So far no source of infection identified.  CXR, UA and Covid-19 negative.  Flu A and B negative.  No Gi symptoms.  No leukocytosis, negative procal, normal lactate.  Suspect another viral illness causing fever, but given his history of malignancy, some concern for recurrence or another malignancy (lymphoma, leukemia?).  ESR, CRP and LDH are elevated.  Vasculitis or other rheumatologic process also in the differential.  Patient still has chemo port that was accessed on 12/10.  Site is non-tender without any induration or erythema. Concern for port infection at this point.   --stop Zosyn and monitor clinically since no source identified yet --blood cultures from admission NGTD --repeating blood cultures today --check Quantiferon to rule out TB  --hepatitis panel given elevated AST, but less likely --will get CT scans chest/abdomen/pelvis to evaluate for any recurrent or new malignancy  --will consider ID, oncology consult based on results of above work up   ADDENDUM:  CT scans showed no signs of malignancy or lymadenopathy, but lungs with concerning ground glass opacities.  Will re-order Covid-19 PCR test.  Place on precautions.  Start Remdesivir.  His rapid Covid-19 test was negative, initial Covid PCR test was ordered on admission but appears to have been cancelled.      Coronary artery disease of native artery of native heart with stable angina pectoris (HCC) Stable.  No active chest pain.  --continue ASA and Lipitor  Benign essential HTN Does not appear to be on antihypertensives outpatient, aside from terazosin for BPN. Normotensive at this time.  Asthma (?COPD) - not acutely exacerbated --continue albuterol inhaler --sub Pulmicort nebs BID for Flovent HFA  Obesity (BMI 30-39.9) Recommend diet & exercise for weight loss.   Outpatient follow up.  BPH (benign prostatic hyperplasia) --continue home terazosin  Mixed hyperlipidemia --continue Lipitor  GERD (gastroesophageal reflux disease) --continue PPI   DVT prophylaxis: Lovenox   Code Status: Full Code  Family Communication: none at bedside  Disposition Plan:  Discharge home pending further improvement and evaluation above.     Consultants:   None  Procedures:   None  Antimicrobials:   Zosyn empirically 12/22-12/23    Objective: Vitals:   02/06/19 0509 02/06/19 0529 02/06/19 0636 02/06/19 0741  BP: 123/64 (!) 101/58    Pulse: 96 (!) 115 91   Resp: 18 18    Temp: (!) 101.7 F (38.7 C) (!) 100.8 F (38.2 C) 99.7 F (37.6 C)   TempSrc: Oral Oral Oral   SpO2: 94% 95% 95% 93%  Weight:      Height:        Intake/Output Summary (Last 24 hours) at 02/06/2019 0747 Last data filed at 02/06/2019 0314 Gross per 24 hour  Intake 1635.53 ml  Output --  Net 1635.53 ml   Filed Weights   02/04/19 2056  Weight: 117.9 kg    Examination:  General exam: awake, alert, no acute distress HEENT: moist mucus membranes, hearing grossly normal  Respiratory system: clear to auscultation bilaterally, no wheezes, rales or rhonchi, normal respiratory effort. Cardiovascular system: normal S1/S2, RRR, no JVD, murmurs, rubs, gallops, no pedal edema.   Gastrointestinal system: soft, non-tender, non-distended abdomen, no organomegaly or masses felt, normal bowel sounds. Central nervous system: alert and oriented x4. no gross focal neurologic deficits, normal speech Extremities: moves all, no edema, normal tone Skin: dry, intact, normal temperature, no rash, chemo port palpated on right anterior chest wall non-tender and no surrounding erythema, warmth or induration Psychiatry: normal mood, congruent affect, judgement and insight appear normal    Data Reviewed: I have personally reviewed following labs and imaging studies  CBC: Recent Labs  Lab  02/04/19 2243 02/05/19 0541 02/06/19 0628  WBC 5.6 5.2 5.0  NEUTROABS 4.5  --  4.4  HGB 14.2 13.4 12.8*  HCT 42.2 39.3 38.6*  MCV 82.7 81.9 82.0  PLT 122* 109* 343*   Basic Metabolic Panel: Recent Labs  Lab 02/04/19 2243 02/05/19 0541 02/06/19 0628  NA 134* 133* 134*  K 3.8 4.2 3.7  CL 100 101 105  CO2 23 23 19*  GLUCOSE 118* 115* 110*  BUN '18 20 17  ' CREATININE 1.42* 1.46* 1.17  CALCIUM 8.6* 8.3* 7.9*  MG  --   --  2.1   GFR: Estimated Creatinine Clearance: 74.5 mL/min (by C-G formula based on SCr of 1.17 mg/dL). Liver Function Tests: Recent Labs  Lab 02/04/19 2243 02/05/19 0541  AST 43* 45*  ALT 31 28  ALKPHOS 53 46  BILITOT 0.8 0.9  PROT 8.4* 7.4  ALBUMIN 4.0 3.6   No results for input(s): LIPASE, AMYLASE in the last 168 hours. No results for input(s): AMMONIA in the last 168 hours. Coagulation Profile: No results for input(s): INR, PROTIME in the last 168 hours. Cardiac Enzymes: No results for input(s): CKTOTAL, CKMB, CKMBINDEX, TROPONINI in the last 168 hours. BNP (last 3 results) No results for input(s): PROBNP in the last 8760 hours. HbA1C:  No results for input(s): HGBA1C in the last 72 hours. CBG: No results for input(s): GLUCAP in the last 168 hours. Lipid Profile: No results for input(s): CHOL, HDL, LDLCALC, TRIG, CHOLHDL, LDLDIRECT in the last 72 hours. Thyroid Function Tests: No results for input(s): TSH, T4TOTAL, FREET4, T3FREE, THYROIDAB in the last 72 hours. Anemia Panel: No results for input(s): VITAMINB12, FOLATE, FERRITIN, TIBC, IRON, RETICCTPCT in the last 72 hours. Sepsis Labs: Recent Labs  Lab 02/04/19 2241 02/05/19 0541  PROCALCITON <0.10 0.12  LATICACIDVEN 1.7  --     Recent Results (from the past 240 hour(s))  SARS Coronavirus 2 Ag (30 min TAT) - Nasal Swab (BD Veritor Kit)     Status: None   Collection Time: 02/04/19  5:56 PM   Specimen: Nasal Swab (BD Veritor Kit)  Result Value Ref Range Status   SARS Coronavirus 2 Ag  NEGATIVE NEGATIVE Final    Comment: (NOTE) SARS-CoV-2 antigen NOT DETECTED.  Negative results are presumptive.  Negative results do not preclude SARS-CoV-2 infection and should not be used as the sole basis for treatment or other patient management decisions, including infection  control decisions, particularly in the presence of clinical signs and  symptoms consistent with COVID-19, or in those who have been in contact with the virus.  Negative results must be combined with clinical observations, patient history, and epidemiological information. The expected result is Negative. Fact Sheet for Patients: PodPark.tn Fact Sheet for Healthcare Providers: GiftContent.is This test is not yet approved or cleared by the [place] FDA and  has been authorized for detection and/or diagnosis of SARS-CoV-2 by FDA under an Emergency Use Authorization (EUA).  This EUA will remain in effect (meaning this test can be used) for the duration of  the COVID-19 de claration under Section 564(b)(1) of the Act, 21 U.S.C. section 360bbb-3(b)(1), unless the authorization is terminated or revoked sooner. Performed at [facility], [address], [place], [place] [address]   Blood culture (routine x 2)     Status: None (Preliminary result)   Collection Time: 02/04/19 10:41 PM   Specimen: BLOOD  Result Value Ref Range Status   Specimen Description BLOOD RIGHT ANTECUBITAL  Final   Special Requests   Final    BOTTLES DRAWN AEROBIC AND ANAEROBIC Blood Culture results may not be optimal due to an excessive volume of blood received in culture bottles   Culture   Final    NO GROWTH 2 DAYS Performed at [facility], [address] [place], [place] [address]    Report Status PENDING  Incomplete  Blood culture (routine x 2)     Status: None (Preliminary result)   Collection Time: 02/04/19 11:12 PM   Specimen: BLOOD  Result Value  Ref Range Status   Specimen Description BLOOD BLOOD RIGHT HAND  Final   Special Requests   Final    BOTTLES DRAWN AEROBIC AND ANAEROBIC Blood Culture adequate volume   Culture   Final    NO GROWTH 2 DAYS Performed at [facility], [address], [place], [place] [address]    Report Status PENDING  Incomplete         Radiology Studies: DG Chest 2 View  Result Date: 02/04/2019 CLINICAL DATA:  73 year old male with fever and cough. EXAM: CHEST - 2 VIEW COMPARISON:  Chest radiograph dated 10/20/2017. FINDINGS: Right-sided Port-A-Cath in similar position. There is an area of density in the left upper lobe similar to prior radiograph, likely scarring. No consolidative  changes. There is no pleural effusion or pneumothorax. Stable cardiac silhouette. No acute osseous pathology. IMPRESSION: 1. No acute cardiopulmonary process. 2. Stable left upper lobe scarring. Electronically Signed   By: Anner Crete M.D.   On: 02/04/2019 18:47        Scheduled Meds: . aspirin EC  81 mg Oral Daily  . atorvastatin  10 mg Oral Daily  . budesonide  0.25 mg Nebulization BID  . cholecalciferol  2,000 Units Oral Daily  . enoxaparin (LOVENOX) injection  40 mg Subcutaneous QHS  . fluticasone  2 spray Each Nare Daily  . pantoprazole  40 mg Oral Daily  . terazosin  1 mg Oral QHS  . vitamin B-12  500 mcg Oral Daily   Continuous Infusions: . sodium chloride 75 mL/hr at 02/06/19 0314  . piperacillin-tazobactam (ZOSYN)  IV 3.375 g (02/06/19 0511)     LOS: 2 days    Time spent: 40-45 minutes    Ezekiel Slocumb, DO [facility]   If 7PM-7AM, please contact night-coverage [email or web address] Password [facility] 02/06/2019, 7:47 AM

## 2019-02-06 NOTE — Consult Note (Signed)
Remdesivir - Pharmacy Brief Note   O:  ALT: 31 CXR: New bilateral ground-glass opacities within LEFT and RIGHT lung are nonspecific with broad differential including pulmonary edema, drug reaction, atypical infection or inflammation SpO2: 90% on room air   A/P:  Messaged MD regarding holding off on initiation of therapy until COVID test results return as patient has no positive tests currently. MD stated that CT is convincing of virus and would like to initiate.  Remdesivir 200 mg IVPB once followed by 100 mg IVPB daily x 4 days.   Pearla Dubonnet, PharmD Clinical Pharmacist 02/06/2019 7:32 PM

## 2019-02-06 NOTE — Progress Notes (Signed)
Daughter called and updated of patient transfer to 2A.  All questions and concerns answered.

## 2019-02-06 NOTE — Progress Notes (Signed)
IV team consult placed for blood cultures to be drawn from port. Unfortunately IV team is unable to draw blood cultures from a port unless the port was just placed. RN made aware

## 2019-02-07 DIAGNOSIS — J9601 Acute respiratory failure with hypoxia: Secondary | ICD-10-CM

## 2019-02-07 DIAGNOSIS — R509 Fever, unspecified: Secondary | ICD-10-CM

## 2019-02-07 DIAGNOSIS — J1282 Pneumonia due to coronavirus disease 2019: Secondary | ICD-10-CM

## 2019-02-07 DIAGNOSIS — U071 COVID-19: Principal | ICD-10-CM

## 2019-02-07 DIAGNOSIS — J1289 Other viral pneumonia: Secondary | ICD-10-CM

## 2019-02-07 LAB — COMPREHENSIVE METABOLIC PANEL
ALT: 30 U/L (ref 0–44)
AST: 59 U/L — ABNORMAL HIGH (ref 15–41)
Albumin: 3.1 g/dL — ABNORMAL LOW (ref 3.5–5.0)
Alkaline Phosphatase: 38 U/L (ref 38–126)
Anion gap: 10 (ref 5–15)
BUN: 14 mg/dL (ref 8–23)
CO2: 22 mmol/L (ref 22–32)
Calcium: 8.1 mg/dL — ABNORMAL LOW (ref 8.9–10.3)
Chloride: 105 mmol/L (ref 98–111)
Creatinine, Ser: 1.21 mg/dL (ref 0.61–1.24)
GFR calc Af Amer: >60 mL/min (ref >60)
GFR calc non Af Amer: 59 mL/min — ABNORMAL LOW (ref >60)
Glucose, Bld: 115 mg/dL — ABNORMAL HIGH (ref 70–99)
Potassium: 3.8 mmol/L (ref 3.5–5.1)
Sodium: 137 mmol/L (ref 135–145)
Total Bilirubin: 0.7 mg/dL (ref 0.3–1.2)
Total Protein: 7 g/dL (ref 6.5–8.1)

## 2019-02-07 LAB — CBC WITH DIFFERENTIAL/PLATELET
Abs Immature Granulocytes: 0.02 10*3/uL (ref 0.00–0.07)
Basophils Absolute: 0 10*3/uL (ref 0.0–0.1)
Basophils Relative: 0 %
Eosinophils Absolute: 0 10*3/uL (ref 0.0–0.5)
Eosinophils Relative: 0 %
HCT: 38.5 % — ABNORMAL LOW (ref 39.0–52.0)
Hemoglobin: 13 g/dL (ref 13.0–17.0)
Immature Granulocytes: 0 %
Lymphocytes Relative: 11 %
Lymphs Abs: 0.5 10*3/uL — ABNORMAL LOW (ref 0.7–4.0)
MCH: 27.7 pg (ref 26.0–34.0)
MCHC: 33.8 g/dL (ref 30.0–36.0)
MCV: 82.1 fL (ref 80.0–100.0)
Monocytes Absolute: 0.3 10*3/uL (ref 0.1–1.0)
Monocytes Relative: 5 %
Neutro Abs: 4.2 10*3/uL (ref 1.7–7.7)
Neutrophils Relative %: 84 %
Platelets: 124 10*3/uL — ABNORMAL LOW (ref 150–400)
RBC: 4.69 MIL/uL (ref 4.22–5.81)
RDW: 13.4 % (ref 11.5–15.5)
WBC: 5 10*3/uL (ref 4.0–10.5)
nRBC: 0 % (ref 0.0–0.2)

## 2019-02-07 LAB — FERRITIN: Ferritin: 1138 ng/mL — ABNORMAL HIGH (ref 24–336)

## 2019-02-07 LAB — QUANTIFERON-TB GOLD PLUS (RQFGPL)
QuantiFERON Mitogen Value: 2.64 IU/mL
QuantiFERON Nil Value: 0.06 IU/mL
QuantiFERON TB1 Ag Value: 0.06 IU/mL
QuantiFERON TB2 Ag Value: 0.06 IU/mL

## 2019-02-07 LAB — C-REACTIVE PROTEIN: CRP: 4.4 mg/dL — ABNORMAL HIGH (ref <1.0)

## 2019-02-07 LAB — QUANTIFERON-TB GOLD PLUS: QuantiFERON-TB Gold Plus: NEGATIVE

## 2019-02-07 LAB — SARS CORONAVIRUS 2 (TAT 6-24 HRS): SARS Coronavirus 2: POSITIVE — AB

## 2019-02-07 LAB — MAGNESIUM: Magnesium: 2.2 mg/dL (ref 1.7–2.4)

## 2019-02-07 LAB — FIBRIN DERIVATIVES D-DIMER (ARMC ONLY): Fibrin derivatives D-dimer (ARMC): 664.3 ng/mL (FEU) — ABNORMAL HIGH (ref 0.00–499.00)

## 2019-02-07 MED ORDER — BUDESONIDE 180 MCG/ACT IN AEPB
1.0000 | INHALATION_SPRAY | Freq: Two times a day (BID) | RESPIRATORY_TRACT | Status: DC
  Administered 2019-02-07 – 2019-02-09 (×5): 1 via RESPIRATORY_TRACT
  Filled 2019-02-07 (×2): qty 1

## 2019-02-07 MED ORDER — SODIUM CHLORIDE 0.9 % IV SOLN
100.0000 mg | Freq: Every day | INTRAVENOUS | Status: DC
  Administered 2019-02-08 – 2019-02-09 (×2): 100 mg via INTRAVENOUS
  Filled 2019-02-07: qty 20
  Filled 2019-02-07 (×2): qty 100

## 2019-02-07 MED ORDER — ORAL CARE MOUTH RINSE
15.0000 mL | Freq: Two times a day (BID) | OROMUCOSAL | Status: DC
  Administered 2019-02-07 – 2019-02-09 (×4): 15 mL via OROMUCOSAL

## 2019-02-07 MED ORDER — DEXAMETHASONE SODIUM PHOSPHATE 10 MG/ML IJ SOLN
6.0000 mg | Freq: Every day | INTRAMUSCULAR | Status: DC
  Administered 2019-02-07 – 2019-02-09 (×3): 6 mg via INTRAVENOUS
  Filled 2019-02-07 (×4): qty 0.6

## 2019-02-07 MED ORDER — SODIUM CHLORIDE 0.9 % IV SOLN
200.0000 mg | Freq: Once | INTRAVENOUS | Status: AC
  Administered 2019-02-07: 200 mg via INTRAVENOUS
  Filled 2019-02-07: qty 200

## 2019-02-07 NOTE — Progress Notes (Addendum)
Positive covid result given to me by Randa Ngo , Dr. Arbutus Ped notified over the phone. Remdesivir will now be started.   Updated 1110: Daughter Jolayne Haines Tinnin updated over the phone, per daughter , she would like for MD to call her. MD made aware.

## 2019-02-07 NOTE — Progress Notes (Addendum)
PROGRESS NOTE    [name]  ERX:[id number] DOB: [birth year] DOA: 02/04/2019  PCP: Glean Hess, MD    LOS - 3   Brief Narrative:  73 y.o.malewith medical history significant ofsquamous cell carcinoma of the lung status post treatment since 2018, BPH, prediabetic, morbid obesity, vitamin D deficiency, vitamin B-12 deficiency, hypertension, GERD, coronary artery disease among other things who came to the ER with feverforthe last 1 to 2 days. Denied any sick contactsor knownCOVID-19exposure.Reported having sinus issues last week that have resolved, no new or worsening symptoms aside from fever. In the ED, temp 103 F, HR 93, otherwise stable vitals. CXR and UA normal, rapid Covid-19 screen negative. No leukocytosis. Patient was admitted for fever of unknown origin and started on Zosyn empirically.  Patient continued with fevers after 24 hours antibiotics, Zosyn discontinued.  Flu A/B negative.  Further evaluation of fever including CT chest/abdomen/pelvis on 12/23 showed lungs with ground glass opacities, suspicious for Covid-19 pneumonia.  Placed on isolation precautions and started remdesivir empirically.  Re-sent COVID-19 PCR test which returned positive 12/24.  Not initially hypoxic but began needing supplemental oxygen overnight 12/23-24.    Subjective 12/24: Patient seen up in chair watching TV this AM.  He is now on supplemental oxygen.  Reports mild SOB.  Denies feeling febrile or chills this morning.  Feels generally weak and tired overall. Says he just wants to get better.  Assessment & Plan:   Principal Problem:   Pneumonia due to COVID-19 virus Active Problems:   Acute respiratory failure with hypoxia (HCC)   Fever   Coronary artery disease of native artery of native heart with stable angina pectoris (HCC)   Benign essential HTN   Asthma   Obesity (BMI 30-39.9)   BPH (benign prostatic hyperplasia)   Mixed hyperlipidemia   GERD (gastroesophageal reflux  disease)   Acute respiratory failure with hypoxia secondary to COVID-19 pneumonia --Remdesivir per pharmacy protocol --Decadron IV --maintain airborne and contact precautions --supplemental oxygen, maintain o2 sat > 90% --Combivent inhaler --vitamin C and zinc --Tussionex and Robitussin  Fever -secondary to COVID-19 infection, initially thought to be of unknown origin. CTs of the chest/abdomen/pelvis showed no recurrence of malignancy or adenopathy, did show bilateral groundglass opacities in the lungs consistent with COVID-19 pneumonia, management as above.  Hepatitis panel negative.  Flu a and B negative.   Initial and repeat blood cultures are no growth to date. --Follow-up blood cultures  Coronary artery disease of native artery of native heart with stable angina pectoris (HCC) Stable. No active chest pain.  --continue ASA and Lipitor  Benign essential HTN Does not appear to be on antihypertensives outpatient, aside from terazosin for BPN. Normotensive at this time.  Asthma (?COPD)- not acutely exacerbated --continue albuterol inhaler --sub Pulmicort nebs BID for Flovent HFA  Obesity (BMI 30-39.9) Recommend diet &exercise for weight loss. Outpatient follow up.  BPH (benign prostatic hyperplasia) --continue home terazosin  Mixed hyperlipidemia --continue Lipitor  GERD (gastroesophageal reflux disease) --continue PPI   DVT prophylaxis: Lovenox   Code Status: Full Code  Family Communication: Daughter, [name], updated by phone today Disposition Plan: Expect discharge home pending further clinical improvement, wean off oxygen   Consultants:   None  Procedures:   None  Antimicrobials:   Zosyn, start 12/22-12/23 (started empirically on admission, stopped since procal negative)   Objective: Vitals:   02/07/19 0334 02/07/19 0528 02/07/19 0825 02/07/19 0826  BP: (!) 122/54  (!) 142/58   Pulse: 95  (!) 122  98  Resp: 20  19   Temp: 99.5 F (37.5 C)   99.7 F (37.6 C)   TempSrc: Oral  Oral   SpO2: 92%  93%   Weight:  112.4 kg    Height:        Intake/Output Summary (Last 24 hours) at 02/07/2019 1437 Last data filed at 02/07/2019 1252 Gross per 24 hour  Intake 2304.89 ml  Output 0 ml  Net 2304.89 ml   Filed Weights   02/04/19 2056 02/07/19 0528  Weight: 117.9 kg 112.4 kg    Examination:  General exam: awake, alert, no acute distress HEENT: moist mucus membranes, hearing grossly normal  Respiratory system: Overall diminished with bibasilar crackles, no wheezes or rhonchi, normal respiratory effort, on 3 L/min oxygen [place]. Cardiovascular system: normal S1/S2, RRR, no JVD, murmurs, rubs, gallops, no pedal edema.   Central nervous system: alert and oriented x4. no gross focal neurologic deficits, normal speech Extremities: moves all, no edema, normal tone Skin: dry, intact, normal temperature Psychiatry: normal mood, congruent affect, judgement and insight appear normal    Data Reviewed: I have personally reviewed following labs and imaging studies  CBC: Recent Labs  Lab 02/04/19 2243 02/05/19 0541 02/06/19 0628 02/07/19 0323  WBC 5.6 5.2 5.0 5.0  NEUTROABS 4.5  --  4.4 4.2  HGB 14.2 13.4 12.8* 13.0  HCT 42.2 39.3 38.6* 38.5*  MCV 82.7 81.9 82.0 82.1  PLT 122* 109* 114* 254*   Basic Metabolic Panel: Recent Labs  Lab 02/04/19 2243 02/05/19 0541 02/06/19 0628 02/07/19 0323  NA 134* 133* 134* 137  K 3.8 4.2 3.7 3.8  CL 100 101 105 105  CO2 23 23 19* 22  GLUCOSE 118* 115* 110* 115*  BUN _0 CREATININE 1.42* 1.46* 1.17 1.21  CALCIUM 8.6* 8.3* 7.9* 8.1*  MG  --   --  2.1 2.2   GFR: Estimated Creatinine Clearance: 70.3 mL/min (by C-G formula based on SCr of 1.21 mg/dL). Liver Function Tests: Recent Labs  Lab 02/04/19 2243 02/05/19 0541 02/07/19 0323  AST 43* 45* 59*  ALT _1 ALKPHOS 53 46 38  BILITOT 0.8 0.9 0.7  PROT 8.4* 7.4 7.0  ALBUMIN 4.0 3.6 3.1*   No results for input(s):  LIPASE, AMYLASE in the last 168 hours. No results for input(s): AMMONIA in the last 168 hours. Coagulation Profile: No results for input(s): INR, PROTIME in the last 168 hours. Cardiac Enzymes: No results for input(s): CKTOTAL, CKMB, CKMBINDEX, TROPONINI in the last 168 hours. BNP (last 3 results) No results for input(s): PROBNP in the last 8760 hours. HbA1C: No results for input(s): HGBA1C in the last 72 hours. CBG: No results for input(s): GLUCAP in the last 168 hours. Lipid Profile: No results for input(s): CHOL, HDL, LDLCALC, TRIG, CHOLHDL, LDLDIRECT in the last 72 hours. Thyroid Function Tests: No results for input(s): TSH, T4TOTAL, FREET4, T3FREE, THYROIDAB in the last 72 hours. Anemia Panel: Recent Labs    02/07/19 0323  FERRITIN 1,138*   Sepsis Labs: Recent Labs  Lab 02/04/19 2241 02/05/19 0541 02/06/19 0628  PROCALCITON <0.10 0.12 0.10  LATICACIDVEN 1.7  --   --     Recent Results (from the past 240 hour(s))  SARS Coronavirus 2 Ag (30 min TAT) - Nasal Swab (BD Veritor Kit)     Status: None   Collection Time: 02/04/19  5:56 PM   Specimen: Nasal Swab (BD Veritor Kit)  Result Value Ref Range Status  SARS Coronavirus 2 Ag NEGATIVE NEGATIVE Final    Comment: (NOTE) SARS-CoV-2 antigen NOT DETECTED.  Negative results are presumptive.  Negative results do not preclude SARS-CoV-2 infection and should not be used as the sole basis for treatment or other patient management decisions, including infection  control decisions, particularly in the presence of clinical signs and  symptoms consistent with COVID-19, or in those who have been in contact with the virus.  Negative results must be combined with clinical observations, patient history, and epidemiological information. The expected result is Negative. Fact Sheet for Patients: PodPark.tn Fact Sheet for Healthcare Providers: GiftContent.is This test is not yet  approved or cleared by the [place] FDA and  has been authorized for detection and/or diagnosis of SARS-CoV-2 by FDA under an Emergency Use Authorization (EUA).  This EUA will remain in effect (meaning this test can be used) for the duration of  the COVID-19 de claration under Section 564(b)(1) of the Act, 21 U.S.C. section 360bbb-3(b)(1), unless the authorization is terminated or revoked sooner. Performed at [facility], [address] [place], [place] [address]   Blood culture (routine x 2)     Status: None (Preliminary result)   Collection Time: 02/04/19 10:41 PM   Specimen: BLOOD  Result Value Ref Range Status   Specimen Description BLOOD RIGHT ANTECUBITAL  Final   Special Requests   Final    BOTTLES DRAWN AEROBIC AND ANAEROBIC Blood Culture results may not be optimal due to an excessive volume of blood received in culture bottles   Culture   Final    NO GROWTH 3 DAYS Performed at [facility], [address], [place], [place] [address]    Report Status PENDING  Incomplete  Blood culture (routine x 2)     Status: None (Preliminary result)   Collection Time: 02/04/19 11:12 PM   Specimen: BLOOD  Result Value Ref Range Status   Specimen Description BLOOD BLOOD RIGHT HAND  Final   Special Requests   Final    BOTTLES DRAWN AEROBIC AND ANAEROBIC Blood Culture adequate volume   Culture   Final    NO GROWTH 3 DAYS Performed at [facility], [address] [place], [place] [address]    Report Status PENDING  Incomplete  CULTURE, BLOOD (ROUTINE X 2) w Reflex to ID Panel     Status: None (Preliminary result)   Collection Time: 02/06/19  8:36 AM   Specimen: BLOOD  Result Value Ref Range Status   Specimen Description BLOOD BLOOD LEFT HAND  Final   Special Requests   Final    BOTTLES DRAWN AEROBIC AND ANAEROBIC Blood Culture results may not be optimal due to an excessive volume of blood received in culture bottles   Culture   Final    NO GROWTH  < 24 HOURS Performed at [facility], [address], [place], [place] [address]    Report Status PENDING  Incomplete  CULTURE, BLOOD (ROUTINE X 2) w Reflex to ID Panel     Status: None (Preliminary result)   Collection Time: 02/06/19  8:43 AM   Specimen: BLOOD  Result Value Ref Range Status   Specimen Description BLOOD BLOOD RIGHT HAND  Final   Special Requests   Final    BOTTLES DRAWN AEROBIC AND ANAEROBIC Blood Culture results may not be optimal due to an excessive volume of blood received in culture bottles   Culture   Final    NO GROWTH < 24 HOURS Performed  at [facility], [place], [place] [address]    Report Status PENDING  Incomplete  SARS CORONAVIRUS 2 (TAT 6-24 HRS) Nasopharyngeal Nasopharyngeal Swab     Status: Abnormal   Collection Time: 02/06/19  8:17 PM   Specimen: Nasopharyngeal Swab  Result Value Ref Range Status   SARS Coronavirus 2 POSITIVE (A) NEGATIVE Final    Comment: RESULT CALLED TO, READ BACK BY AND VERIFIED WITH: Gayland Curry RN 11:00 02/07/19 ([email or web address]) (NOTE) SARS-CoV-2 target nucleic acids are DETECTED. The SARS-CoV-2 RNA is generally detectable in upper and lower respiratory specimens during the acute phase of infection. Positive results are indicative of the presence of SARS-CoV-2 RNA. Clinical correlation with patient history and other diagnostic information is  necessary to determine patient infection status. Positive results do not rule out bacterial infection or co-infection with other viruses.  The expected result is Negative. Fact Sheet for Patients: SugarRoll.be Fact Sheet for Healthcare Providers: https://www.woods-mathews.com/ This test is not yet approved or cleared by the [place] FDA and  has been authorized for detection and/or diagnosis of SARS-CoV-2 by FDA under an Emergency Use Authorization (EUA). This EUA will remain  in effect (meaning this test can be  used) for  the duration of the COVID-19 declaration under Section 564(b)(1) of the Act, 21 U.S.C. section 360bbb-3(b)(1), unless the authorization is terminated or revoked sooner. Performed at [facility], [place] [address], [place], [place] [address]          Radiology Studies: CT CHEST W CONTRAST  Result Date: 02/06/2019 CLINICAL DATA:  Squamous cell carcinoma along. Evaluate treatment response. Fever and cough. EXAM: CT CHEST, ABDOMEN, AND PELVIS WITH CONTRAST TECHNIQUE: Multidetector CT imaging of the chest, abdomen and pelvis was performed following the standard protocol during bolus administration of intravenous contrast. CONTRAST:  169m OMNIPAQUE IOHEXOL 300 MG/ML  SOLN COMPARISON:  CT 12/25/2018 FINDINGS: CT CHEST FINDINGS Cardiovascular: Port in the anterior chest wall with tip in distal SVC. Coronary artery calcification and aortic atherosclerotic calcification. Mediastinum/Nodes: No axillary supraclavicular adenopathy. No mediastinal hilar adenopathy. No pericardial effusion. Esophagus normal. Lungs/Pleura: Band of angular consolidation with air bronchograms in LEFT upper lobe unchanged from prior consistent with radiation treatment effect. There is increased ground-glass opacities superimposed on centrilobular emphysema in the RIGHT upper lobe and posterior aspect of the LEFT lower lobe. No suspicious nodularity. No focal consolidation. There is diffuse ground-glass opacities in the lower lobes additionally. Musculoskeletal: Low-density lesion in the RIGHT hepatic lobe consistent benign cysts. Normal gallbladder. CT ABDOMEN AND PELVIS FINDINGS Hepatobiliary: No focal hepatic lesion. No biliary ductal dilatation. Gallbladder is normal. Common bile duct is normal. Pancreas: Pancreas is normal. No ductal dilatation. No pancreatic inflammation. Spleen: Normal spleen Adrenals/urinary tract: Adrenal glands normal. Low-density lesions in the kidneys likely represent benign cysts. Ureters  and bladder normal. Stomach/Bowel: Stomach, small bowel, appendix, and cecum are normal. The colon and rectosigmoid colon are normal. Vascular/Lymphatic: Abdominal aorta is normal caliber with atherosclerotic calcification. There is no retroperitoneal or periportal lymphadenopathy. No pelvic lymphadenopathy. Reproductive: Prostate normal Other: No peritoneal metastasis Musculoskeletal: No aggressive osseous lesion. IMPRESSION: Chest Impression: 1. New bilateral ground-glass opacities within LEFT and RIGHT lung are nonspecific with broad differential including pulmonary edema, drug reaction, atypical infection or inflammation. 2. Angular consolidation in the LEFT upper lobe consistent radiation treatment. 3. No evidence of lung cancer recurrence. Abdomen / Pelvis Impression: 1. No evidence of metastatic disease in the abdomen pelvis. 2. Benign appearing cysts of the kidneys and  liver. 3.  Aortic Atherosclerosis (ICD10-I70.0). Electronically Signed   By: Suzy Bouchard M.D.   On: 02/06/2019 17:41   CT ABDOMEN PELVIS W CONTRAST  Result Date: 02/06/2019 CLINICAL DATA:  Squamous cell carcinoma along. Evaluate treatment response. Fever and cough. EXAM: CT CHEST, ABDOMEN, AND PELVIS WITH CONTRAST TECHNIQUE: Multidetector CT imaging of the chest, abdomen and pelvis was performed following the standard protocol during bolus administration of intravenous contrast. CONTRAST:  158m OMNIPAQUE IOHEXOL 300 MG/ML  SOLN COMPARISON:  CT 12/25/2018 FINDINGS: CT CHEST FINDINGS Cardiovascular: Port in the anterior chest wall with tip in distal SVC. Coronary artery calcification and aortic atherosclerotic calcification. Mediastinum/Nodes: No axillary supraclavicular adenopathy. No mediastinal hilar adenopathy. No pericardial effusion. Esophagus normal. Lungs/Pleura: Band of angular consolidation with air bronchograms in LEFT upper lobe unchanged from prior consistent with radiation treatment effect. There is increased  ground-glass opacities superimposed on centrilobular emphysema in the RIGHT upper lobe and posterior aspect of the LEFT lower lobe. No suspicious nodularity. No focal consolidation. There is diffuse ground-glass opacities in the lower lobes additionally. Musculoskeletal: Low-density lesion in the RIGHT hepatic lobe consistent benign cysts. Normal gallbladder. CT ABDOMEN AND PELVIS FINDINGS Hepatobiliary: No focal hepatic lesion. No biliary ductal dilatation. Gallbladder is normal. Common bile duct is normal. Pancreas: Pancreas is normal. No ductal dilatation. No pancreatic inflammation. Spleen: Normal spleen Adrenals/urinary tract: Adrenal glands normal. Low-density lesions in the kidneys likely represent benign cysts. Ureters and bladder normal. Stomach/Bowel: Stomach, small bowel, appendix, and cecum are normal. The colon and rectosigmoid colon are normal. Vascular/Lymphatic: Abdominal aorta is normal caliber with atherosclerotic calcification. There is no retroperitoneal or periportal lymphadenopathy. No pelvic lymphadenopathy. Reproductive: Prostate normal Other: No peritoneal metastasis Musculoskeletal: No aggressive osseous lesion. IMPRESSION: Chest Impression: 1. New bilateral ground-glass opacities within LEFT and RIGHT lung are nonspecific with broad differential including pulmonary edema, drug reaction, atypical infection or inflammation. 2. Angular consolidation in the LEFT upper lobe consistent radiation treatment. 3. No evidence of lung cancer recurrence. Abdomen / Pelvis Impression: 1. No evidence of metastatic disease in the abdomen pelvis. 2. Benign appearing cysts of the kidneys and liver. 3.  Aortic Atherosclerosis (ICD10-I70.0). Electronically Signed   By: SSuzy BouchardM.D.   On: 02/06/2019 17:41        Scheduled Meds: . vitamin C  500 mg Oral Daily  . aspirin EC  81 mg Oral Daily  . atorvastatin  10 mg Oral Daily  . budesonide  1 puff Inhalation BID  . cholecalciferol  2,000 Units  Oral Daily  . dexamethasone (DECADRON) injection  6 mg Intravenous Daily  . enoxaparin (LOVENOX) injection  40 mg Subcutaneous QHS  . fluticasone  2 spray Each Nare Daily  . Ipratropium-Albuterol  1 puff Inhalation Q6H  . mouth rinse  15 mL Mouth Rinse BID  . pantoprazole  40 mg Oral Daily  . terazosin  1 mg Oral QHS  . vitamin B-12  500 mcg Oral Daily  . zinc sulfate  220 mg Oral Daily   Continuous Infusions: . sodium chloride 75 mL/hr at 02/07/19 1252  . [START ON 02/08/2019] remdesivir 100 mg in NS 100 mL       LOS: 3 days    Time spent: 35-40 minutes    KEzekiel Slocumb DO [facility]   If 7PM-7AM, please contact night-coverage [email or web address] Password TThe [facility]12/24/2020, 2:37 PM

## 2019-02-07 NOTE — Plan of Care (Signed)
  Problem: Education: Goal: Knowledge of General Education information will improve Description: Including pain rating scale, medication(s)/side effects and non-pharmacologic comfort measures Outcome: Progressing   Problem: Activity: Goal: Risk for activity intolerance will decrease Outcome: Progressing Note: Patient ambulating in the room with extended oxygen cord     Problem: Safety: Goal: Ability to remain free from injury will improve Outcome: Progressing   Problem: Clinical Measurements: Goal: Respiratory complications will improve Outcome: Not Progressing Note: Increased oxygen to 3L, still very short of breath on exertion

## 2019-02-08 LAB — COMPREHENSIVE METABOLIC PANEL
ALT: 39 U/L (ref 0–44)
AST: 68 U/L — ABNORMAL HIGH (ref 15–41)
Albumin: 3.3 g/dL — ABNORMAL LOW (ref 3.5–5.0)
Alkaline Phosphatase: 37 U/L — ABNORMAL LOW (ref 38–126)
Anion gap: 11 (ref 5–15)
BUN: 16 mg/dL (ref 8–23)
CO2: 21 mmol/L — ABNORMAL LOW (ref 22–32)
Calcium: 8.3 mg/dL — ABNORMAL LOW (ref 8.9–10.3)
Chloride: 109 mmol/L (ref 98–111)
Creatinine, Ser: 1.04 mg/dL (ref 0.61–1.24)
GFR calc Af Amer: >60 mL/min (ref >60)
GFR calc non Af Amer: >60 mL/min (ref >60)
Glucose, Bld: 113 mg/dL — ABNORMAL HIGH (ref 70–99)
Potassium: 3.7 mmol/L (ref 3.5–5.1)
Sodium: 141 mmol/L (ref 135–145)
Total Bilirubin: 0.6 mg/dL (ref 0.3–1.2)
Total Protein: 7.3 g/dL (ref 6.5–8.1)

## 2019-02-08 LAB — FIBRIN DERIVATIVES D-DIMER (ARMC ONLY): Fibrin derivatives D-dimer (ARMC): 812.91 ng/mL (FEU) — ABNORMAL HIGH (ref 0.00–499.00)

## 2019-02-08 LAB — CBC WITH DIFFERENTIAL/PLATELET
Abs Immature Granulocytes: 0.02 10*3/uL (ref 0.00–0.07)
Basophils Absolute: 0 10*3/uL (ref 0.0–0.1)
Basophils Relative: 0 %
Eosinophils Absolute: 0 10*3/uL (ref 0.0–0.5)
Eosinophils Relative: 0 %
HCT: 40.3 % (ref 39.0–52.0)
Hemoglobin: 13 g/dL (ref 13.0–17.0)
Immature Granulocytes: 0 %
Lymphocytes Relative: 14 %
Lymphs Abs: 1 10*3/uL (ref 0.7–4.0)
MCH: 27.8 pg (ref 26.0–34.0)
MCHC: 32.3 g/dL (ref 30.0–36.0)
MCV: 86.1 fL (ref 80.0–100.0)
Monocytes Absolute: 0.4 10*3/uL (ref 0.1–1.0)
Monocytes Relative: 6 %
Neutro Abs: 5.3 10*3/uL (ref 1.7–7.7)
Neutrophils Relative %: 80 %
Platelets: 160 10*3/uL (ref 150–400)
RBC: 4.68 MIL/uL (ref 4.22–5.81)
RDW: 13.3 % (ref 11.5–15.5)
Smear Review: NORMAL
WBC: 6.5 10*3/uL (ref 4.0–10.5)
nRBC: 0 % (ref 0.0–0.2)

## 2019-02-08 LAB — MAGNESIUM: Magnesium: 2.4 mg/dL (ref 1.7–2.4)

## 2019-02-08 LAB — FERRITIN: Ferritin: 1332 ng/mL — ABNORMAL HIGH (ref 24–336)

## 2019-02-08 LAB — C-REACTIVE PROTEIN: CRP: 4 mg/dL — ABNORMAL HIGH (ref <1.0)

## 2019-02-08 NOTE — Progress Notes (Signed)
Talked to patient's daughter Jolayne Haines and updated her about the plan of care. Has some questions about lab values for this patient, contacted Dr. Arbutus Ped and asked if she can talk to her, per DO will contact daughter.

## 2019-02-08 NOTE — Progress Notes (Signed)
PROGRESS NOTE    DERYCK HIPPLER  WOE:[id number] DOB: 02-Sep-1945 DOA: 02/04/2019  PCP: Glean Hess, MD    LOS - 4   Brief Narrative:  73 y.o.malewith medical history significant ofsquamous cell carcinoma of the lung status post treatment since 2018, BPH, prediabetic, morbid obesity, vitamin D deficiency, vitamin B-12 deficiency, hypertension, GERD, coronary artery disease among other things who came to the ER with feverfor1 to 2 days. Denied any sick contactsor knownCOVID-19exposure.Reported having sinus issues last week that have resolved, no new or worsening symptoms aside from fever. In the ED, temp 103 F, HR 93, otherwise stable vitals. CXR and UA normal, rapid Covid-19 screen negative. No leukocytosis. Patient was admitted for fever of unknown origin and started on Zosyn empirically.  Patient continued with fevers after 24 hours antibiotics, Zosyn discontinued.  Flu A/B negative.  Further evaluation of fever including CT chest/abdomen/pelvis on 12/23 showed lungs with ground glass opacities, suspicious for Covid-19 pneumonia.  Placed on isolation precautions and started remdesivir empirically.  Re-sent COVID-19 PCR test which returned positive 12/24.  Not initially hypoxic but began needing supplemental oxygen overnight 12/23-24.  Remains on 3 L/min oxygen AM of 12/25.    Subjective 12/25: Patient up in chair this AM. States feeling a little better.  Does report having some diarrhea. No nausea or vomiting.  Denies chest pain, N/V/D.  Reports he has been up ambulating around the room, with a little SOB.  No acute events reported.  Has been afebrile now over 24 hours.  Assessment & Plan:   Principal Problem:   Pneumonia due to COVID-19 virus Active Problems:   Acute respiratory failure with hypoxia (HCC)   Fever   Coronary artery disease of native artery of native heart with stable angina pectoris (HCC)   Benign essential HTN   Asthma   Obesity (BMI 30-39.9)   BPH  (benign prostatic hyperplasia)   Mixed hyperlipidemia   GERD (gastroesophageal reflux disease)   Acute respiratory failure with hypoxia secondary to COVID-19 pneumonia --Remdesivir per pharmacy protocol - last day 12/28 --Decadron IV --maintain airborne and contact precautions --supplemental oxygen, maintain o2 sat > 90% --Combivent inhaler --vitamin C and zinc --Tussionex and Robitussin  Fever -secondary to COVID-19 infection, initially thought to be of unknown origin. CTs of the chest/abdomen/pelvis showed no recurrence of malignancy or adenopathy, did show bilateral groundglass opacities in the lungs consistent with COVID-19 pneumonia, management as above.  Hepatitis panel negative.  Flu a and B negative.   Initial and repeat blood cultures are no growth to date. --Follow-up blood cultures  Coronary artery disease of native artery of native heart with stable angina pectoris (HCC) Stable. No active chest pain.  --continue ASA and Lipitor  Benign essential HTN Does not appear to be on antihypertensives outpatient, aside from terazosin for BPN. Normotensive at this time.  Asthma (?COPD)- not acutely exacerbated --continue albuterol inhaler --sub Pulmicort nebs BID for Flovent HFA  Obesity (BMI 30-39.9) Recommend diet &exercise for weight loss. Outpatient follow up.  BPH (benign prostatic hyperplasia) --continue home terazosin  Mixed hyperlipidemia --continue Lipitor  GERD (gastroesophageal reflux disease) --continue PPI   DVT prophylaxis: Lovenox   Code Status: Full Code  Family Communication: Daughter, Tammi Klippel, updated by phone today Disposition Plan: Expect discharge home pending further clinical improvement, wean off oxygen   Consultants:   None  Procedures:   None  Antimicrobials:   Zosyn, start 12/22-12/23 (started empirically on admission, stopped since procal negative)    Objective: Vitals:  02/07/19 0826 02/07/19 1639 02/07/19  2000 02/08/19 0341  BP:  140/67 (!) 134/50 137/75  Pulse: 98 95 92 90  Resp:  _0 Temp:  99.3 F (37.4 C) 98.8 F (37.1 C) 98.8 F (37.1 C)  TempSrc:  Oral Oral Oral  SpO2:  93% 91% 94%  Weight:    111.6 kg  Height:        Intake/Output Summary (Last 24 hours) at 02/08/2019 0809 Last data filed at 02/08/2019 1610 Gross per 24 hour  Intake 3514.35 ml  Output 0 ml  Net 3514.35 ml   Filed Weights   02/04/19 2056 02/07/19 0528 02/08/19 0341  Weight: 117.9 kg 112.4 kg 111.6 kg    Examination:  General exam: awake, alert, no acute distress HEENT: moist mucus membranes, hearing grossly normal  Respiratory system: diminished breath sounds bilaterally, no wheezes, rales or rhonchi, normal respiratory effort, on 3L/min oxygen [place]. Cardiovascular system: normal S1/S2, RRR, no JVD, murmurs, rubs, gallops, no pedal edema.   Central nervous system: alert and oriented x4. no gross focal neurologic deficits, normal speech Extremities: moves all, no edema, normal tone Skin: dry, intact, normal temperature Psychiatry: normal mood, congruent affect, judgement and insight appear normal    Data Reviewed: I have personally reviewed following labs and imaging studies  CBC: Recent Labs  Lab 02/04/19 2243 02/05/19 0541 02/06/19 0628 02/07/19 0323 02/08/19 0657  WBC 5.6 5.2 5.0 5.0 6.5  NEUTROABS 4.5  --  4.4 4.2 5.3  HGB 14.2 13.4 12.8* 13.0 13.0  HCT 42.2 39.3 38.6* 38.5* 40.3  MCV 82.7 81.9 82.0 82.1 86.1  PLT 122* 109* 114* 124* 960   Basic Metabolic Panel: Recent Labs  Lab 02/04/19 2243 02/05/19 0541 02/06/19 0628 02/07/19 0323 02/08/19 0657  NA 134* 133* 134* 137 141  K 3.8 4.2 3.7 3.8 3.7  CL 100 101 105 105 109  CO2 23 23 19* 22 21*  GLUCOSE 118* 115* 110* 115* 113*  BUN _1 CREATININE 1.42* 1.46* 1.17 1.21 1.04  CALCIUM 8.6* 8.3* 7.9* 8.1* 8.3*  MG  --   --  2.1 2.2 2.4   GFR: Estimated Creatinine Clearance: 81.5 mL/min (by C-G formula based  on SCr of 1.04 mg/dL). Liver Function Tests: Recent Labs  Lab 02/04/19 2243 02/05/19 0541 02/07/19 0323 02/08/19 0657  AST 43* 45* 59* 68*  ALT _2 39  ALKPHOS 53 46 38 37*  BILITOT 0.8 0.9 0.7 0.6  PROT 8.4* 7.4 7.0 7.3  ALBUMIN 4.0 3.6 3.1* 3.3*   No results for input(s): LIPASE, AMYLASE in the last 168 hours. No results for input(s): AMMONIA in the last 168 hours. Coagulation Profile: No results for input(s): INR, PROTIME in the last 168 hours. Cardiac Enzymes: No results for input(s): CKTOTAL, CKMB, CKMBINDEX, TROPONINI in the last 168 hours. BNP (last 3 results) No results for input(s): PROBNP in the last 8760 hours. HbA1C: No results for input(s): HGBA1C in the last 72 hours. CBG: No results for input(s): GLUCAP in the last 168 hours. Lipid Profile: No results for input(s): CHOL, HDL, LDLCALC, TRIG, CHOLHDL, LDLDIRECT in the last 72 hours. Thyroid Function Tests: No results for input(s): TSH, T4TOTAL, FREET4, T3FREE, THYROIDAB in the last 72 hours. Anemia Panel: Recent Labs    02/07/19 0323 02/08/19 0657  FERRITIN 1,138* 1,332*   Sepsis Labs: Recent Labs  Lab 02/04/19 2241 02/05/19 0541 02/06/19 0628  PROCALCITON <0.10 0.12 0.10  LATICACIDVEN 1.7  --   --  Recent Results (from the past 240 hour(s))  SARS Coronavirus 2 Ag (30 min TAT) - Nasal Swab (BD Veritor Kit)     Status: None   Collection Time: 02/04/19  5:56 PM   Specimen: Nasal Swab (BD Veritor Kit)  Result Value Ref Range Status   SARS Coronavirus 2 Ag NEGATIVE NEGATIVE Final    Comment: (NOTE) SARS-CoV-2 antigen NOT DETECTED.  Negative results are presumptive.  Negative results do not preclude SARS-CoV-2 infection and should not be used as the sole basis for treatment or other patient management decisions, including infection  control decisions, particularly in the presence of clinical signs and  symptoms consistent with COVID-19, or in those who have been in contact with the virus.   Negative results must be combined with clinical observations, patient history, and epidemiological information. The expected result is Negative. Fact Sheet for Patients: PodPark.tn Fact Sheet for Healthcare Providers: GiftContent.is This test is not yet approved or cleared by the [place] FDA and  has been authorized for detection and/or diagnosis of SARS-CoV-2 by FDA under an Emergency Use Authorization (EUA).  This EUA will remain in effect (meaning this test can be used) for the duration of  the COVID-19 de claration under Section 564(b)(1) of the Act, 21 U.S.C. section 360bbb-3(b)(1), unless the authorization is terminated or revoked sooner. Performed at [facility], [address], [place], [place] [address]   Blood culture (routine x 2)     Status: None (Preliminary result)   Collection Time: 02/04/19 10:41 PM   Specimen: BLOOD  Result Value Ref Range Status   Specimen Description BLOOD RIGHT ANTECUBITAL  Final   Special Requests   Final    BOTTLES DRAWN AEROBIC AND ANAEROBIC Blood Culture results may not be optimal due to an excessive volume of blood received in culture bottles   Culture   Final    NO GROWTH 4 DAYS Performed at [facility], [address], [place], [place] [address]    Report Status PENDING  Incomplete  Blood culture (routine x 2)     Status: None (Preliminary result)   Collection Time: 02/04/19 11:12 PM   Specimen: BLOOD  Result Value Ref Range Status   Specimen Description BLOOD BLOOD RIGHT HAND  Final   Special Requests   Final    BOTTLES DRAWN AEROBIC AND ANAEROBIC Blood Culture adequate volume   Culture   Final    NO GROWTH 4 DAYS Performed at [facility], [address], [place], [place] [address]    Report Status PENDING  Incomplete  CULTURE, BLOOD (ROUTINE X 2) w Reflex to ID Panel     Status: None (Preliminary result)   Collection Time: 02/06/19   8:36 AM   Specimen: BLOOD  Result Value Ref Range Status   Specimen Description BLOOD BLOOD LEFT HAND  Final   Special Requests   Final    BOTTLES DRAWN AEROBIC AND ANAEROBIC Blood Culture results may not be optimal due to an excessive volume of blood received in culture bottles   Culture   Final    NO GROWTH 2 DAYS Performed at [facility], [address] [place], [place] [address]    Report Status PENDING  Incomplete  CULTURE, BLOOD (ROUTINE X 2) w Reflex to ID Panel     Status: None (Preliminary result)   Collection Time: 02/06/19  8:43 AM   Specimen: BLOOD  Result Value Ref Range Status   Specimen Description BLOOD BLOOD RIGHT HAND  Final   Special Requests   Final    BOTTLES DRAWN AEROBIC AND ANAEROBIC Blood Culture results may not be optimal due to an excessive volume of blood received in culture bottles   Culture   Final    NO GROWTH 2 DAYS Performed at [facility], [address] [place], [place] [address]    Report Status PENDING  Incomplete  SARS CORONAVIRUS 2 (TAT 6-24 HRS) Nasopharyngeal Nasopharyngeal Swab     Status: Abnormal   Collection Time: 02/06/19  8:17 PM   Specimen: Nasopharyngeal Swab  Result Value Ref Range Status   SARS Coronavirus 2 POSITIVE (A) NEGATIVE Final    Comment: RESULT CALLED TO, READ BACK BY AND VERIFIED WITH: Gayland Curry RN 11:00 02/07/19 ([email or web address]) (NOTE) SARS-CoV-2 target nucleic acids are DETECTED. The SARS-CoV-2 RNA is generally detectable in upper and lower respiratory specimens during the acute phase of infection. Positive results are indicative of the presence of SARS-CoV-2 RNA. Clinical correlation with patient history and other diagnostic information is  necessary to determine patient infection status. Positive results do not rule out bacterial infection or co-infection with other viruses.  The expected result is Negative. Fact Sheet for Patients: SugarRoll.be Fact Sheet for  Healthcare Providers: https://www.woods-mathews.com/ This test is not yet approved or cleared by the [place] FDA and  has been authorized for detection and/or diagnosis of SARS-CoV-2 by FDA under an Emergency Use Authorization (EUA). This EUA will remain  in effect (meaning this test can be used) for  the duration of the COVID-19 declaration under Section 564(b)(1) of the Act, 21 U.S.C. section 360bbb-3(b)(1), unless the authorization is terminated or revoked sooner. Performed at [facility], [place] [address], [place], [place] [address]          Radiology Studies: CT CHEST W CONTRAST  Result Date: 02/06/2019 CLINICAL DATA:  Squamous cell carcinoma along. Evaluate treatment response. Fever and cough. EXAM: CT CHEST, ABDOMEN, AND PELVIS WITH CONTRAST TECHNIQUE: Multidetector CT imaging of the chest, abdomen and pelvis was performed following the standard protocol during bolus administration of intravenous contrast. CONTRAST:  129m OMNIPAQUE IOHEXOL 300 MG/ML  SOLN COMPARISON:  CT 12/25/2018 FINDINGS: CT CHEST FINDINGS Cardiovascular: Port in the anterior chest wall with tip in distal SVC. Coronary artery calcification and aortic atherosclerotic calcification. Mediastinum/Nodes: No axillary supraclavicular adenopathy. No mediastinal hilar adenopathy. No pericardial effusion. Esophagus normal. Lungs/Pleura: Band of angular consolidation with air bronchograms in LEFT upper lobe unchanged from prior consistent with radiation treatment effect. There is increased ground-glass opacities superimposed on centrilobular emphysema in the RIGHT upper lobe and posterior aspect of the LEFT lower lobe. No suspicious nodularity. No focal consolidation. There is diffuse ground-glass opacities in the lower lobes additionally. Musculoskeletal: Low-density lesion in the RIGHT hepatic lobe consistent benign cysts. Normal gallbladder. CT ABDOMEN AND PELVIS FINDINGS Hepatobiliary: No focal hepatic  lesion. No biliary ductal dilatation. Gallbladder is normal. Common bile duct is normal. Pancreas: Pancreas is normal. No ductal dilatation. No pancreatic inflammation. Spleen: Normal spleen Adrenals/urinary tract: Adrenal glands normal. Low-density lesions in the kidneys likely represent benign cysts. Ureters and bladder normal. Stomach/Bowel: Stomach, small bowel, appendix, and cecum are normal. The colon and rectosigmoid colon are normal. Vascular/Lymphatic: Abdominal aorta is normal caliber with atherosclerotic calcification. There is no retroperitoneal or periportal lymphadenopathy. No pelvic lymphadenopathy. Reproductive: Prostate normal Other: No peritoneal metastasis Musculoskeletal: No aggressive osseous lesion. IMPRESSION: Chest Impression: 1. New bilateral ground-glass opacities within LEFT and RIGHT lung are nonspecific with broad differential including  pulmonary edema, drug reaction, atypical infection or inflammation. 2. Angular consolidation in the LEFT upper lobe consistent radiation treatment. 3. No evidence of lung cancer recurrence. Abdomen / Pelvis Impression: 1. No evidence of metastatic disease in the abdomen pelvis. 2. Benign appearing cysts of the kidneys and liver. 3.  Aortic Atherosclerosis (ICD10-I70.0). Electronically Signed   By: Suzy Bouchard M.D.   On: 02/06/2019 17:41   CT ABDOMEN PELVIS W CONTRAST  Result Date: 02/06/2019 CLINICAL DATA:  Squamous cell carcinoma along. Evaluate treatment response. Fever and cough. EXAM: CT CHEST, ABDOMEN, AND PELVIS WITH CONTRAST TECHNIQUE: Multidetector CT imaging of the chest, abdomen and pelvis was performed following the standard protocol during bolus administration of intravenous contrast. CONTRAST:  116m OMNIPAQUE IOHEXOL 300 MG/ML  SOLN COMPARISON:  CT 12/25/2018 FINDINGS: CT CHEST FINDINGS Cardiovascular: Port in the anterior chest wall with tip in distal SVC. Coronary artery calcification and aortic atherosclerotic calcification.  Mediastinum/Nodes: No axillary supraclavicular adenopathy. No mediastinal hilar adenopathy. No pericardial effusion. Esophagus normal. Lungs/Pleura: Band of angular consolidation with air bronchograms in LEFT upper lobe unchanged from prior consistent with radiation treatment effect. There is increased ground-glass opacities superimposed on centrilobular emphysema in the RIGHT upper lobe and posterior aspect of the LEFT lower lobe. No suspicious nodularity. No focal consolidation. There is diffuse ground-glass opacities in the lower lobes additionally. Musculoskeletal: Low-density lesion in the RIGHT hepatic lobe consistent benign cysts. Normal gallbladder. CT ABDOMEN AND PELVIS FINDINGS Hepatobiliary: No focal hepatic lesion. No biliary ductal dilatation. Gallbladder is normal. Common bile duct is normal. Pancreas: Pancreas is normal. No ductal dilatation. No pancreatic inflammation. Spleen: Normal spleen Adrenals/urinary tract: Adrenal glands normal. Low-density lesions in the kidneys likely represent benign cysts. Ureters and bladder normal. Stomach/Bowel: Stomach, small bowel, appendix, and cecum are normal. The colon and rectosigmoid colon are normal. Vascular/Lymphatic: Abdominal aorta is normal caliber with atherosclerotic calcification. There is no retroperitoneal or periportal lymphadenopathy. No pelvic lymphadenopathy. Reproductive: Prostate normal Other: No peritoneal metastasis Musculoskeletal: No aggressive osseous lesion. IMPRESSION: Chest Impression: 1. New bilateral ground-glass opacities within LEFT and RIGHT lung are nonspecific with broad differential including pulmonary edema, drug reaction, atypical infection or inflammation. 2. Angular consolidation in the LEFT upper lobe consistent radiation treatment. 3. No evidence of lung cancer recurrence. Abdomen / Pelvis Impression: 1. No evidence of metastatic disease in the abdomen pelvis. 2. Benign appearing cysts of the kidneys and liver. 3.  Aortic  Atherosclerosis (ICD10-I70.0). Electronically Signed   By: SSuzy BouchardM.D.   On: 02/06/2019 17:41        Scheduled Meds: . vitamin C  500 mg Oral Daily  . aspirin EC  81 mg Oral Daily  . atorvastatin  10 mg Oral Daily  . budesonide  1 puff Inhalation BID  . cholecalciferol  2,000 Units Oral Daily  . dexamethasone (DECADRON) injection  6 mg Intravenous Daily  . enoxaparin (LOVENOX) injection  40 mg Subcutaneous QHS  . fluticasone  2 spray Each Nare Daily  . Ipratropium-Albuterol  1 puff Inhalation Q6H  . mouth rinse  15 mL Mouth Rinse BID  . pantoprazole  40 mg Oral Daily  . terazosin  1 mg Oral QHS  . vitamin B-12  500 mcg Oral Daily  . zinc sulfate  220 mg Oral Daily   Continuous Infusions: . sodium chloride 75 mL/hr at 02/08/19 0638  . remdesivir 100 mg in NS 100 mL       LOS: 4 days    Time spent: 30-35 minutes  Ezekiel Slocumb, DO [facility]   If 7PM-7AM, please contact night-coverage [email or web address] Password [email or web address] 02/08/2019, 8:09 AM

## 2019-02-08 NOTE — Progress Notes (Signed)
At start of shift patient found to be desatting to high 80s on current 3L O2 via [place] regiment. Patient was placed on 5L O2 and O2 sats only slightly improved - 90-91% with desats to high 80s during OOB activity. Patient denies CP at this time but reports "chest tightness" 8/10 earlier in the evening while doing activity in the room. NP was notified and patient placed on HFNC via RT, 12 LPM. O2 sats low to mid 90s. Patient instructed to promptly notify this RN if any CP/chest tightness returns.

## 2019-02-09 ENCOUNTER — Inpatient Hospital Stay (HOSPITAL_COMMUNITY)
Admission: AD | Admit: 2019-02-09 | Discharge: 2019-03-18 | DRG: 177 | Disposition: E | Payer: Medicare HMO | Source: Other Acute Inpatient Hospital | Attending: Internal Medicine | Admitting: Internal Medicine

## 2019-02-09 ENCOUNTER — Other Ambulatory Visit: Payer: Self-pay

## 2019-02-09 ENCOUNTER — Encounter (HOSPITAL_COMMUNITY): Payer: Self-pay | Admitting: Internal Medicine

## 2019-02-09 DIAGNOSIS — Z801 Family history of malignant neoplasm of trachea, bronchus and lung: Secondary | ICD-10-CM

## 2019-02-09 DIAGNOSIS — Z7982 Long term (current) use of aspirin: Secondary | ICD-10-CM | POA: Diagnosis not present

## 2019-02-09 DIAGNOSIS — I2510 Atherosclerotic heart disease of native coronary artery without angina pectoris: Secondary | ICD-10-CM | POA: Diagnosis present

## 2019-02-09 DIAGNOSIS — J45909 Unspecified asthma, uncomplicated: Secondary | ICD-10-CM | POA: Diagnosis present

## 2019-02-09 DIAGNOSIS — R6510 Systemic inflammatory response syndrome (SIRS) of non-infectious origin without acute organ dysfunction: Secondary | ICD-10-CM | POA: Diagnosis not present

## 2019-02-09 DIAGNOSIS — I10 Essential (primary) hypertension: Secondary | ICD-10-CM | POA: Diagnosis present

## 2019-02-09 DIAGNOSIS — E876 Hypokalemia: Secondary | ICD-10-CM | POA: Diagnosis not present

## 2019-02-09 DIAGNOSIS — R791 Abnormal coagulation profile: Secondary | ICD-10-CM | POA: Diagnosis not present

## 2019-02-09 DIAGNOSIS — Z515 Encounter for palliative care: Secondary | ICD-10-CM | POA: Diagnosis not present

## 2019-02-09 DIAGNOSIS — J4520 Mild intermittent asthma, uncomplicated: Secondary | ICD-10-CM

## 2019-02-09 DIAGNOSIS — K219 Gastro-esophageal reflux disease without esophagitis: Secondary | ICD-10-CM | POA: Diagnosis not present

## 2019-02-09 DIAGNOSIS — N400 Enlarged prostate without lower urinary tract symptoms: Secondary | ICD-10-CM | POA: Diagnosis not present

## 2019-02-09 DIAGNOSIS — J159 Unspecified bacterial pneumonia: Secondary | ICD-10-CM | POA: Diagnosis not present

## 2019-02-09 DIAGNOSIS — Z79899 Other long term (current) drug therapy: Secondary | ICD-10-CM

## 2019-02-09 DIAGNOSIS — E538 Deficiency of other specified B group vitamins: Secondary | ICD-10-CM | POA: Diagnosis present

## 2019-02-09 DIAGNOSIS — E559 Vitamin D deficiency, unspecified: Secondary | ICD-10-CM | POA: Diagnosis present

## 2019-02-09 DIAGNOSIS — Z833 Family history of diabetes mellitus: Secondary | ICD-10-CM

## 2019-02-09 DIAGNOSIS — U071 COVID-19: Secondary | ICD-10-CM | POA: Diagnosis not present

## 2019-02-09 DIAGNOSIS — R7989 Other specified abnormal findings of blood chemistry: Secondary | ICD-10-CM | POA: Diagnosis not present

## 2019-02-09 DIAGNOSIS — Z4682 Encounter for fitting and adjustment of non-vascular catheter: Secondary | ICD-10-CM | POA: Diagnosis not present

## 2019-02-09 DIAGNOSIS — Z87891 Personal history of nicotine dependence: Secondary | ICD-10-CM | POA: Diagnosis not present

## 2019-02-09 DIAGNOSIS — R0600 Dyspnea, unspecified: Secondary | ICD-10-CM

## 2019-02-09 DIAGNOSIS — J189 Pneumonia, unspecified organism: Secondary | ICD-10-CM

## 2019-02-09 DIAGNOSIS — J982 Interstitial emphysema: Secondary | ICD-10-CM | POA: Diagnosis not present

## 2019-02-09 DIAGNOSIS — E669 Obesity, unspecified: Secondary | ICD-10-CM | POA: Diagnosis not present

## 2019-02-09 DIAGNOSIS — R918 Other nonspecific abnormal finding of lung field: Secondary | ICD-10-CM | POA: Diagnosis not present

## 2019-02-09 DIAGNOSIS — Z6834 Body mass index (BMI) 34.0-34.9, adult: Secondary | ICD-10-CM

## 2019-02-09 DIAGNOSIS — J9601 Acute respiratory failure with hypoxia: Secondary | ICD-10-CM | POA: Diagnosis not present

## 2019-02-09 DIAGNOSIS — R509 Fever, unspecified: Secondary | ICD-10-CM | POA: Diagnosis not present

## 2019-02-09 DIAGNOSIS — Z807 Family history of other malignant neoplasms of lymphoid, hematopoietic and related tissues: Secondary | ICD-10-CM | POA: Diagnosis not present

## 2019-02-09 DIAGNOSIS — Z9221 Personal history of antineoplastic chemotherapy: Secondary | ICD-10-CM

## 2019-02-09 DIAGNOSIS — E119 Type 2 diabetes mellitus without complications: Secondary | ICD-10-CM | POA: Diagnosis present

## 2019-02-09 DIAGNOSIS — R000 Tachycardia, unspecified: Secondary | ICD-10-CM | POA: Diagnosis not present

## 2019-02-09 DIAGNOSIS — R319 Hematuria, unspecified: Secondary | ICD-10-CM | POA: Diagnosis not present

## 2019-02-09 DIAGNOSIS — I444 Left anterior fascicular block: Secondary | ICD-10-CM | POA: Diagnosis present

## 2019-02-09 DIAGNOSIS — Z66 Do not resuscitate: Secondary | ICD-10-CM | POA: Diagnosis not present

## 2019-02-09 DIAGNOSIS — R0609 Other forms of dyspnea: Secondary | ICD-10-CM | POA: Diagnosis not present

## 2019-02-09 DIAGNOSIS — J1282 Pneumonia due to coronavirus disease 2019: Secondary | ICD-10-CM | POA: Diagnosis present

## 2019-02-09 DIAGNOSIS — J1289 Other viral pneumonia: Secondary | ICD-10-CM | POA: Diagnosis not present

## 2019-02-09 DIAGNOSIS — Y95 Nosocomial condition: Secondary | ICD-10-CM

## 2019-02-09 DIAGNOSIS — C3492 Malignant neoplasm of unspecified part of left bronchus or lung: Secondary | ICD-10-CM | POA: Diagnosis present

## 2019-02-09 LAB — CBC WITH DIFFERENTIAL/PLATELET
Abs Immature Granulocytes: 0.04 10*3/uL (ref 0.00–0.07)
Basophils Absolute: 0 10*3/uL (ref 0.0–0.1)
Basophils Relative: 0 %
Eosinophils Absolute: 0 10*3/uL (ref 0.0–0.5)
Eosinophils Relative: 0 %
HCT: 39.5 % (ref 39.0–52.0)
Hemoglobin: 13.4 g/dL (ref 13.0–17.0)
Immature Granulocytes: 1 %
Lymphocytes Relative: 11 %
Lymphs Abs: 0.9 10*3/uL (ref 0.7–4.0)
MCH: 27.9 pg (ref 26.0–34.0)
MCHC: 33.9 g/dL (ref 30.0–36.0)
MCV: 82.3 fL (ref 80.0–100.0)
Monocytes Absolute: 0.4 10*3/uL (ref 0.1–1.0)
Monocytes Relative: 5 %
Neutro Abs: 6.5 10*3/uL (ref 1.7–7.7)
Neutrophils Relative %: 83 %
Platelets: 186 10*3/uL (ref 150–400)
RBC: 4.8 MIL/uL (ref 4.22–5.81)
RDW: 13.6 % (ref 11.5–15.5)
Smear Review: NORMAL
WBC: 7.9 10*3/uL (ref 4.0–10.5)
nRBC: 0 % (ref 0.0–0.2)

## 2019-02-09 LAB — COMPREHENSIVE METABOLIC PANEL
ALT: 47 U/L — ABNORMAL HIGH (ref 0–44)
AST: 66 U/L — ABNORMAL HIGH (ref 15–41)
Albumin: 3.3 g/dL — ABNORMAL LOW (ref 3.5–5.0)
Alkaline Phosphatase: 46 U/L (ref 38–126)
Anion gap: 10 (ref 5–15)
BUN: 20 mg/dL (ref 8–23)
CO2: 23 mmol/L (ref 22–32)
Calcium: 8.6 mg/dL — ABNORMAL LOW (ref 8.9–10.3)
Chloride: 109 mmol/L (ref 98–111)
Creatinine, Ser: 0.95 mg/dL (ref 0.61–1.24)
GFR calc Af Amer: >60 mL/min (ref >60)
GFR calc non Af Amer: >60 mL/min (ref >60)
Glucose, Bld: 120 mg/dL — ABNORMAL HIGH (ref 70–99)
Potassium: 4 mmol/L (ref 3.5–5.1)
Sodium: 142 mmol/L (ref 135–145)
Total Bilirubin: 0.6 mg/dL (ref 0.3–1.2)
Total Protein: 7.4 g/dL (ref 6.5–8.1)

## 2019-02-09 LAB — FIBRIN DERIVATIVES D-DIMER (ARMC ONLY): Fibrin derivatives D-dimer (ARMC): 1227.29 ng/mL (FEU) — ABNORMAL HIGH (ref 0.00–499.00)

## 2019-02-09 LAB — ABO/RH: ABO/RH(D): A POS

## 2019-02-09 LAB — C-REACTIVE PROTEIN: CRP: 2.2 mg/dL — ABNORMAL HIGH (ref <1.0)

## 2019-02-09 LAB — CULTURE, BLOOD (ROUTINE X 2)
Culture: NO GROWTH
Culture: NO GROWTH
Special Requests: ADEQUATE

## 2019-02-09 LAB — MAGNESIUM: Magnesium: 2.3 mg/dL (ref 1.7–2.4)

## 2019-02-09 LAB — FERRITIN: Ferritin: 1374 ng/mL — ABNORMAL HIGH (ref 24–336)

## 2019-02-09 MED ORDER — METHYLPREDNISOLONE SODIUM SUCC 125 MG IJ SOLR
0.5000 mg/kg | Freq: Two times a day (BID) | INTRAMUSCULAR | Status: DC
  Administered 2019-02-09 – 2019-02-11 (×5): 55 mg via INTRAVENOUS
  Filled 2019-02-09 (×5): qty 2

## 2019-02-09 MED ORDER — IPRATROPIUM-ALBUTEROL 20-100 MCG/ACT IN AERS
1.0000 | INHALATION_SPRAY | Freq: Four times a day (QID) | RESPIRATORY_TRACT | Status: DC
  Administered 2019-02-09 – 2019-02-20 (×41): 1 via RESPIRATORY_TRACT
  Filled 2019-02-09: qty 4

## 2019-02-09 MED ORDER — ENOXAPARIN SODIUM 60 MG/0.6ML ~~LOC~~ SOLN
55.0000 mg | SUBCUTANEOUS | Status: DC
  Administered 2019-02-09: 55 mg via SUBCUTANEOUS
  Filled 2019-02-09: qty 0.6

## 2019-02-09 MED ORDER — ZINC SULFATE 220 (50 ZN) MG PO CAPS
220.0000 mg | ORAL_CAPSULE | Freq: Every day | ORAL | Status: DC
  Administered 2019-02-10 – 2019-02-19 (×10): 220 mg via ORAL
  Filled 2019-02-09 (×10): qty 1

## 2019-02-09 MED ORDER — HYDROCOD POLST-CPM POLST ER 10-8 MG/5ML PO SUER
5.0000 mL | Freq: Two times a day (BID) | ORAL | Status: DC | PRN
  Administered 2019-02-13: 5 mL via ORAL
  Filled 2019-02-09: qty 5

## 2019-02-09 MED ORDER — ASCORBIC ACID 500 MG PO TABS
500.0000 mg | ORAL_TABLET | Freq: Every day | ORAL | Status: DC
  Administered 2019-02-10 – 2019-02-19 (×10): 500 mg via ORAL
  Filled 2019-02-09 (×10): qty 1

## 2019-02-09 MED ORDER — SODIUM CHLORIDE 0.9 % IV SOLN
100.0000 mg | Freq: Every day | INTRAVENOUS | Status: AC
  Administered 2019-02-10 – 2019-02-11 (×2): 100 mg via INTRAVENOUS
  Filled 2019-02-09 (×2): qty 20

## 2019-02-09 MED ORDER — ACETAMINOPHEN 325 MG PO TABS
650.0000 mg | ORAL_TABLET | Freq: Four times a day (QID) | ORAL | Status: DC | PRN
  Administered 2019-02-13 – 2019-02-15 (×3): 650 mg via ORAL
  Filled 2019-02-09 (×2): qty 2

## 2019-02-09 MED ORDER — GUAIFENESIN-DM 100-10 MG/5ML PO SYRP
10.0000 mL | ORAL_SOLUTION | ORAL | Status: DC | PRN
  Administered 2019-02-12 – 2019-02-19 (×7): 10 mL via ORAL
  Filled 2019-02-09 (×7): qty 10

## 2019-02-09 MED ORDER — TERAZOSIN HCL 1 MG PO CAPS
1.0000 mg | ORAL_CAPSULE | Freq: Every day | ORAL | Status: DC
  Administered 2019-02-09 – 2019-02-19 (×11): 1 mg via ORAL
  Filled 2019-02-09 (×12): qty 1

## 2019-02-09 MED ORDER — ATORVASTATIN CALCIUM 10 MG PO TABS
10.0000 mg | ORAL_TABLET | Freq: Every day | ORAL | Status: DC
  Administered 2019-02-10 – 2019-02-19 (×10): 10 mg via ORAL
  Filled 2019-02-09 (×10): qty 1

## 2019-02-09 MED ORDER — ASPIRIN EC 81 MG PO TBEC
81.0000 mg | DELAYED_RELEASE_TABLET | Freq: Every day | ORAL | Status: DC
  Administered 2019-02-10 – 2019-02-19 (×10): 81 mg via ORAL
  Filled 2019-02-09 (×10): qty 1

## 2019-02-09 MED ORDER — FLUTICASONE PROPIONATE HFA 44 MCG/ACT IN AERO
2.0000 | INHALATION_SPRAY | Freq: Two times a day (BID) | RESPIRATORY_TRACT | Status: DC
  Administered 2019-02-09 – 2019-02-19 (×21): 2 via RESPIRATORY_TRACT
  Filled 2019-02-09: qty 10.6

## 2019-02-09 MED ORDER — TOCILIZUMAB 400 MG/20ML IV SOLN
800.0000 mg | Freq: Once | INTRAVENOUS | Status: AC
  Administered 2019-02-09: 800 mg via INTRAVENOUS
  Filled 2019-02-09: qty 40

## 2019-02-09 MED ORDER — PANTOPRAZOLE SODIUM 40 MG PO TBEC
40.0000 mg | DELAYED_RELEASE_TABLET | Freq: Every day | ORAL | Status: DC
  Administered 2019-02-10 – 2019-02-19 (×10): 40 mg via ORAL
  Filled 2019-02-09 (×10): qty 1

## 2019-02-09 MED ORDER — DOCUSATE SODIUM 100 MG PO CAPS
100.0000 mg | ORAL_CAPSULE | Freq: Every day | ORAL | Status: DC
  Administered 2019-02-11 – 2019-02-17 (×7): 100 mg via ORAL
  Filled 2019-02-09 (×9): qty 1

## 2019-02-09 NOTE — Plan of Care (Signed)
  Problem: Education: Goal: Knowledge of risk factors and measures for prevention of condition will improve Outcome: Progressing   Problem: Coping: Goal: Psychosocial and spiritual needs will be supported Outcome: Progressing   Problem: Respiratory: Goal: Will maintain a patent airway Outcome: Progressing Goal: Complications related to the disease process, condition or treatment will be avoided or minimized Outcome: Progressing   

## 2019-02-09 NOTE — H&P (Signed)
TRH H&P   Patient Demographics:    [name], is a 73 y.o. male  MRN: [id number]   DOB - 08-Jan-1946  Admit Date - 02/07/2019  Outpatient Primary MD for the patient is Glean Hess, MD  Patient coming from: [facility]  No chief complaint on file.     HPI:    [name]  is a 73 y.o. male,73 y.o. male with medical history significant of squamous cell carcinoma of the lung status post treatment since 2018, BPH, prediabetic, morbid obesity, vitamin D deficiency, vitamin B-12 deficiency, HTN, GERD, CAD who presents as a transfer from [facility] for management of acute respiratory failure with hypoxia secondary to SARS-CoV-2 infection.  Presented to [facility] ED on 12/21 with 2 days of subjective fevers with chills, cough, shortness of breath, tested positive for SARS-CoV-2. Denied any sick contacts or known COVID-19 exposure.  In the ED, temp 103 F, HR 93, otherwise stable vitals.  CXR and UA normal, rapid Covid-19 screen negative.  No leukocytosis.   Patient was admitted for fever of unknown origin and started on Zosyn empirically.  Patient continued with fevers after 24 hours antibiotics, Zosyn discontinued.  Flu A/B negative.  Further evaluation of fever including CT chest/abdomen/pelvis on 12/23 showed lungs with ground glass opacities, suspicious for Covid-19 pneumonia.  Placed on isolation precautions and started remdesivir empirically.  Re-sent COVID-19 PCR test which returned positive 12/24.  Not initially hypoxic but became hypoxic 12/24, oxygen titrated to 12 L/min HFNC for sats 92-94% at the time of transfer.     Review of systems:  Review of Systems:  Constitutional: see HPI HEENT: negative for earaches, epistaxis, or  sore throat Respiratory:see HPI Cardiovascular: negative for chest pain, palpitations, or syncope GU: negative for dysuria, urinary frequency, urinary urgency, hematuria Gastrointestinal: negative for abdominal pain, constipation, diarrhea, nausea or vomiting Musculoskeletal: negative for arthralgias, back pain or myalgias Neurological: negative for dizziness, headaches or weakness Behavioral/Psych: negative for suicidal or homicidal ideation Skin:negative for rash Heme: negative for bruises Endo: negative for hair loss, weight gain/loss  With Past History of the following :   Past Medical History:  Diagnosis Date  . Enlarged prostate   . GERD (gastroesophageal reflux disease)   . Squamous cell lung cancer, left ([place]) 08/2016      Past Surgical History:  Procedure Laterality Date  .  NO PAST SURGERIES    . PORTACATH PLACEMENT Right 10/05/2016   Procedure: INSERTION PORT-A-CATH;  Surgeon: [name], MD;  Location: [facility] ORS;  Service: General;  Laterality: Right;     Social History:     Social History   Tobacco Use  . Smoking status: Former Smoker    Packs/day: 1.00    Years: 15.00    Pack years: 15.00    Types: Cigarettes    Quit date: 09/28/1992    Years since quitting: 26.3  . Smokeless tobacco: Never Used  Substance Use Topics  . Alcohol use: No     Family History :     Family History  Problem Relation Age of Onset  . Lymphoma Mother   . Diabetes Mother   . Lung cancer Sister   . Lung cancer Brother   . Lung cancer Brother     Home Medications:   Prior to Admission medications   Medication Sig Start Date End Date Taking? Authorizing Provider  acetaminophen (TYLENOL) 500 MG tablet Take 1,000 mg by mouth every 4 (four) hours as needed.    Yes [provider]  albuterol (PROVENTIL HFA;VENTOLIN HFA) 108 (90 Base) MCG/ACT inhaler Inhale 2 puffs into the lungs every 6 (six) hours as needed for wheezing or shortness of breath. 04/28/17  Yes Earlie Server, MD  aspirin EC 81 MG tablet Take 81 mg by mouth daily.   Yes [provider]  atorvastatin (LIPITOR) 10 MG tablet Take 1 tablet (10 mg total) by mouth daily. 01/14/19  Yes Glean Hess, MD  Cholecalciferol (VITAMIN D3) 2000 units capsule Take 1 capsule (2,000 Units total) by mouth daily. 09/08/16  Yes Plonk, Gwyndolyn Saxon, MD  cyanocobalamin 500 MCG tablet Take 500 mcg by mouth daily.   Yes [provider]  fluticasone (FLONASE) 50 MCG/ACT nasal spray Place 2 sprays into both nostrils daily. 01/14/19  Yes Glean Hess, MD  fluticasone (FLOVENT HFA) 44 MCG/ACT inhaler Inhale 2 puffs into the lungs 2 (two) times daily. 04/28/17  Yes Earlie Server, MD  lidocaine-prilocaine (EMLA) cream Apply 1 application topically as needed. 12/15/17  Yes Earlie Server, MD  mupirocin ointment (BACTROBAN) 2 % Apply 1 application topically 2 (two) times daily as needed. 10/06/17  Yes Glean Hess, MD  omeprazole (PRILOSEC) 20 MG capsule Take 1 capsule by mouth once daily 10/18/18  Yes Earlie Server, MD  terazosin (HYTRIN) 1 MG capsule Take 1 capsule (1 mg total) by mouth at bedtime. 01/24/17  Yes Plonk, Gwyndolyn Saxon, MD  prochlorperazine (COMPAZINE) 10 MG tablet Take 1 tablet (10 mg total) by mouth every 6 (six) hours as needed (Nausea or vomiting). 10/11/16 06/30/17  Earlie Server, MD     Allergies:    No Known Allergies   Physical Exam:   Vitals  Blood pressure (!) (P) 145/73, temperature (P) 98.7 F (37.1 C), temperature source (P) Oral, resp. rate (!) (P) 22.  Physical Exam   Constitutional - resting comfortably, no acute distress Eyes - pupils equal round and reactive to light and accomodation, extra ocular movements intact Nose - no gross deformity or drainage Mouth - no oral lesions noted Throat - no swelling or erythema Neck - supple, no JVD   CV - (+)S1S2, no murmurs  Resp -diffuse bilateral,  GI - (+)BS, soft, non-tender, non-distended Extrem - no clubbing, cyanosis, or peripheral edema   Skin - no rashes or wounds Neuro - alert, aware, oriented to person/place/time  Psych - normal affect, no anxiety  Patient has Pressure Ulcer on Admission?: no   Data Review:    CBC Recent Labs  Lab 02/06/19 0628 02/07/19 0323 02/08/19 0657 01/16/2019 0420 02/10/19 0157  WBC 5.0 5.0 6.5 7.9 6.7  HGB 12.8* 13.0 13.0 13.4 13.5  HCT 38.6* 38.5* 40.3 39.5 42.0  PLT 114* 124* 160 186 212  MCV 82.0 82.1 86.1 82.3 87.0  MCH 27.2 27.7 27.8 27.9 28.0  MCHC 33.2 33.8 32.3 33.9 32.1  RDW 13.2 13.4 13.3 13.6 13.3  LYMPHSABS 0.4* 0.5* 1.0 0.9 PENDING  MONOABS 0.2 0.3 0.4 0.4 PENDING  EOSABS 0.0 0.0 0.0 0.0 PENDING  BASOSABS 0.0 0.0 0.0 0.0 PENDING   ------------------------------------------------------------------------------------------------------------------  Chemistries  Recent Labs  Lab 02/04/19 2243 02/05/19 0541 02/06/19 0628 02/07/19 0323 02/08/19 0657 02/04/2019 0420  NA 134* 133* 134* 137 141 142  K 3.8 4.2 3.7 3.8 3.7 4.0  CL 100 101 105 105 109 109  CO2 23 23 19* 22 21* 23  GLUCOSE 118* 115* 110* 115* 113* 120*  BUN 18 20 17 14 16 20   CREATININE 1.42* 1.46* 1.17 1.21 1.04 0.95  CALCIUM 8.6* 8.3* 7.9* 8.1* 8.3* 8.6*  MG  --   --  2.1 2.2 2.4 2.3  AST 43* 45*  --  59* 68* 66*  ALT 31 28  --  30 39 47*  ALKPHOS 53 46  --  38 37* 46  BILITOT 0.8 0.9  --  0.7 0.6 0.6   ------------------------------------------------------------------------------------------------------------------ estimated creatinine clearance is 88.9 mL/min (by C-G formula based on SCr of 0.95 mg/dL). ------------------------------------------------------------------------------------------------------------------ No results for input(s): TSH, T4TOTAL, T3FREE, THYROIDAB in the last 72 hours.  Invalid input(s): FREET3  Coagulation profile No results for input(s): INR, PROTIME in the last 168  hours. ------------------------------------------------------------------------------------------------------------------- Recent Labs    02/10/19 0157  DDIMER >20.00*   -------------------------------------------------------------------------------------------------------------------  Cardiac Enzymes No results for input(s): CKMB, TROPONINI, MYOGLOBIN in the last 168 hours.  Invalid input(s): CK ------------------------------------------------------------------------------------------------------------------ No results found for: BNP  COVID-19 Labs  Recent Labs    02/07/19 0323 02/08/19 0657 01/15/2019 0420  FERRITIN 1,138* 1,332* 1,374*  CRP 4.4* 4.0* 2.2*    Lab Results  Component Value Date   SARSCOV2NAA POSITIVE (A) 02/06/2019    ---------------------------------------------------------------------------------------------------------------  Urinalysis    Component Value Date/Time   COLORURINE YELLOW (A) 02/04/2019 2232   APPEARANCEUR HAZY (A) 02/04/2019 2232   LABSPEC 1.020 02/04/2019 2232   PHURINE 5.0 02/04/2019 2232   GLUCOSEU NEGATIVE 02/04/2019 2232   HGBUR NEGATIVE 02/04/2019 2232   [place] NEGATIVE 02/04/2019 2232   KETONESUR NEGATIVE 02/04/2019 2232   PROTEINUR 30 (A) 02/04/2019 2232   NITRITE NEGATIVE 02/04/2019 2232   LEUKOCYTESUR NEGATIVE 02/04/2019 2232    ----------------------------------------------------------------------------------------------------------------   Imaging Results:    No results found.   Assessment & Plan:    Principal Problem:   Acute hypoxemic respiratory failure due to severe acute respiratory syndrome coronavirus 2 (SARS-CoV-2) disease (HCC) Active Problems:   Squamous cell lung cancer, left (HCC)   Obesity (BMI 30-39.9)   Benign essential HTN   GERD (gastroesophageal reflux disease)   Asthma   Pneumonia due to COVID-19 virus     Acute hypoxemic respiratory failure due to SARS-CoV-2 disease/Pneumonia due  to COVID-19 virus: Patient presented to [facility] with fevers, chills, admitted for fever of unknown origin with negative rapid SARS-CoV-2 test.  CT was consistent with viral pneumonia, SARS-CoV-2 PCR subsequently ordered which returned positive.  Initially on room air, he has had increasing oxygen requirement. Date  of Dx: 12/23 Oxygen requirements: 12 LPM Antibiotics: Received vancomycin and Zosyn which has since been stopped Diuretics: n/a  Vitamin C and Zinc: Per protocol Remdesivir: Started on 12/23 Steroids: Started on 12/23 Actemra: 12/26 Convalescent Plasma: Not given yet    Squamous cell lung cancer, left: Resume outpatient follow-up with pathology at discharge.    Obesity (BMI 30-39.9):Obesity affects all facets of care.  Likely secondary to excessive caloric intake.  Encourage patient to reduce caloric intake while increasing physical activity and engage in other lifestyle changes that may result in weight loss.     Benign essential HTN: Blood pressure is at goal.  Continue to monitor.    GERD : Symptoms are controlled on PPI to be continued as inpatient.    Asthma: Continue Pulmicort and Combivent  DVT Prophylaxis Lovenox   AM Labs Ordered, also please review Full Orders  Family Communication: Admission, patients condition and plan of care including tests being ordered have been discussed with the patient who indicate understanding and agree with the plan and Code Status.  Code Status Full  Likely DC to  Home  Condition GUARDED    Consults called: None    Admission status: Admit to inpatient    Time spent in minutes : 61   Peyton Bottoms M.D on 01/19/2019 at 9:24 PM  To page go to [email or web address] - password [facility]

## 2019-02-09 NOTE — Progress Notes (Signed)
[facility] on the phone, report was given. Bed available at [facility]

## 2019-02-09 NOTE — Progress Notes (Signed)
Patients keys were given to his wife, wife did bring in personal belongings which were given to the patient.

## 2019-02-09 NOTE — Progress Notes (Signed)
PROGRESS NOTE    [name]  WGN:[id number] DOB: 17-Aug-1945 DOA: 02/04/2019  PCP: Glean Hess, MD    LOS - 5   Brief Narrative:  73 y.o.malewith medical history significant ofsquamous cell carcinoma of the lung status post treatment since 2018, BPH, prediabetic, morbid obesity, vitamin D deficiency, vitamin B-12 deficiency, hypertension, GERD, coronary artery disease among other things who came to the ER with feverfor1 to 2 days. Denied any sick contactsor knownCOVID-19exposure.Reported having sinus issues last week that have resolved, no new or worsening symptoms aside from fever. In the ED, temp 103 F, HR 93, otherwise stable vitals. CXR and UA normal,rapidCovid-19screennegative. No leukocytosis. Patient was admitted for fever of unknown origin and started on Zosyn empirically.Patient continued with fevers after 24 hours antibiotics, Zosyn discontinued. Flu A/B negative. Further evaluation of fever including CT chest/abdomen/pelvis on 12/23 showed lungs with ground glass opacities, suspicious for Covid-19 pneumonia. Placed on isolation precautions and started remdesivir empirically. Re-sent COVID-19 PCR test which returned positive 12/24. Not initially hypoxic but began needing supplemental oxygen overnight 12/23-24.  Remains on 3 L/min oxygen AM of 12/25.  Overnight desatted to 80's on 3L/min and this AM is requiring 12 L/min HFNC with sats 92-94%.  Transfer to [facility] initiated.  Actemra ordered.  Subjective 12/26: Patient seen up in chair this morning.  Oxygen requirement significantly increased overnight.  But patient states that he feels a lot better today and actually asked if he is able to go home yet.  He denies fevers or chills, chest pain, shortness of breath, nausea vomiting or diarrhea.  Assessment & Plan:   Principal Problem:   Pneumonia due to COVID-19 virus Active Problems:   Acute respiratory failure with hypoxia (HCC)   Fever   Coronary  artery disease of native artery of native heart with stable angina pectoris (HCC)   Benign essential HTN   Asthma   Obesity (BMI 30-39.9)   BPH (benign prostatic hyperplasia)   Mixed hyperlipidemia   GERD (gastroesophageal reflux disease)   Acute respiratory failure with hypoxia secondary to COVID-19 pneumonia 12/26 AM: increasing oxygen requirement, up to 12 L/min HFNC from 3L/min.   Was not hypoxic on admission. --transfer to [facility] initiated - on [place] admit list and [facility] notified --Actemra given 12/26 for increased oxygen requirement --Remdesivir per pharmacy protocol - last day 12/28 --Decadron IV --maintain airborne and contact precautions --supplemental oxygen, maintain o2 sat > 90% --Combivent inhaler --vitamin C and zinc --Tussionex and Robitussin  Fever-secondary to COVID-19 infection, initially thought to be of unknown origin. CTs of the chest/abdomen/pelvis showed no recurrence of malignancy or adenopathy, did show bilateral groundglass opacities in the lungs consistent with COVID-19 pneumonia, management as above.Hepatitis panel negative. Flu a and B negative. Initial and repeat blood cultures are no growth to date. --Follow-up blood cultures  Coronary artery disease of native artery of native heart with stable angina pectoris (HCC) Stable. No active chest pain.  --continue ASA and Lipitor  Benign essential HTN Does not appear to be on antihypertensives outpatient, aside from terazosin for BPN. Normotensive at this time.  Asthma (?COPD)- not acutely exacerbated --continue albuterol inhaler --sub Pulmicort nebs BID for Flovent HFA  Obesity (BMI 30-39.9) Recommend diet &exercise for weight loss. Outpatient follow up.  BPH (benign prostatic hyperplasia) --continue home terazosin  Mixed hyperlipidemia --continue Lipitor  GERD (gastroesophageal reflux disease) --continue PPI   DVT prophylaxis:Lovenox Code Status: Full Code Family  Communication:Daughter, Bobbi, updated by phone [phone] Disposition Plan:Expect discharge home pending further  clinical improvement, wean off oxygen   Consultants:  None  Procedures:  None  Antimicrobials:  Zosyn, start 12/22-12/23(started empirically on admission, stopped since procal negative)    Objective: Vitals:   02/08/19 2145 02/08/19 2302 01/25/2019 0350 01/26/2019 0747  BP:   (!) 147/80 132/73  Pulse:   93 96  Resp:    (!) 24  Temp:   99.1 F (37.3 C) 97.9 F (36.6 C)  TempSrc:   Oral Oral  SpO2: 90% 97% 91% 94%  Weight:   110.6 kg   Height:        Intake/Output Summary (Last 24 hours) at 02/02/2019 0858 Last data filed at 01/28/2019 0824 Gross per 24 hour  Intake 304.69 ml  Output 950 ml  Net -645.31 ml   Filed Weights   02/07/19 0528 02/08/19 0341 01/31/2019 0350  Weight: 112.4 kg 111.6 kg 110.6 kg    Examination:  General exam: awake, alert, no acute distress HEENT: moist mucus membranes, hearing grossly normal  Respiratory system: crackles bilaterally, no wheezes or rhonchi, normal respiratory effort. Cardiovascular system: normal S1/S2, RRR, no JVD, murmurs, rubs, gallops, no pedal edema.   Central nervous system: alert and oriented x4. no gross focal neurologic deficits, normal speech Extremities: moves all, normal tone Skin: dry, intact, normal temperature Psychiatry: normal mood, congruent affect, judgement and insight appear normal    Data Reviewed: I have personally reviewed following labs and imaging studies  CBC: Recent Labs  Lab 02/04/19 2243 02/05/19 0541 02/06/19 0628 02/07/19 0323 02/08/19 0657 01/26/2019 0420  WBC 5.6 5.2 5.0 5.0 6.5 7.9  NEUTROABS 4.5  --  4.4 4.2 5.3 6.5  HGB 14.2 13.4 12.8* 13.0 13.0 13.4  HCT 42.2 39.3 38.6* 38.5* 40.3 39.5  MCV 82.7 81.9 82.0 82.1 86.1 82.3  PLT 122* 109* 114* 124* 160 309   Basic Metabolic Panel: Recent Labs  Lab 02/05/19 0541 02/06/19 0628 02/07/19 0323  02/08/19 0657 02/02/2019 0420  NA 133* 134* 137 141 142  K 4.2 3.7 3.8 3.7 4.0  CL 101 105 105 109 109  CO2 23 19* 22 21* 23  GLUCOSE 115* 110* 115* 113* 120*  BUN '20 17 14 16 20  ' CREATININE 1.46* 1.17 1.21 1.04 0.95  CALCIUM 8.3* 7.9* 8.1* 8.3* 8.6*  MG  --  2.1 2.2 2.4 2.3   GFR: Estimated Creatinine Clearance: 88.9 mL/min (by C-G formula based on SCr of 0.95 mg/dL). Liver Function Tests: Recent Labs  Lab 02/04/19 2243 02/05/19 0541 02/07/19 0323 02/08/19 0657 01/22/2019 0420  AST 43* 45* 59* 68* 66*  ALT '31 28 30 ' 39 47*  ALKPHOS 53 46 38 37* 46  BILITOT 0.8 0.9 0.7 0.6 0.6  PROT 8.4* 7.4 7.0 7.3 7.4  ALBUMIN 4.0 3.6 3.1* 3.3* 3.3*   No results for input(s): LIPASE, AMYLASE in the last 168 hours. No results for input(s): AMMONIA in the last 168 hours. Coagulation Profile: No results for input(s): INR, PROTIME in the last 168 hours. Cardiac Enzymes: No results for input(s): CKTOTAL, CKMB, CKMBINDEX, TROPONINI in the last 168 hours. BNP (last 3 results) No results for input(s): PROBNP in the last 8760 hours. HbA1C: No results for input(s): HGBA1C in the last 72 hours. CBG: No results for input(s): GLUCAP in the last 168 hours. Lipid Profile: No results for input(s): CHOL, HDL, LDLCALC, TRIG, CHOLHDL, LDLDIRECT in the last 72 hours. Thyroid Function Tests: No results for input(s): TSH, T4TOTAL, FREET4, T3FREE, THYROIDAB in the last 72 hours. Anemia Panel: Recent Labs  02/08/19 0657 02/07/2019 0420  FERRITIN 1,332* 1,374*   Sepsis Labs: Recent Labs  Lab 02/04/19 2241 02/05/19 0541 02/06/19 0628  PROCALCITON <0.10 0.12 0.10  LATICACIDVEN 1.7  --   --     Recent Results (from the past 240 hour(s))  SARS Coronavirus 2 Ag (30 min TAT) - Nasal Swab (BD Veritor Kit)     Status: None   Collection Time: 02/04/19  5:56 PM   Specimen: Nasal Swab (BD Veritor Kit)  Result Value Ref Range Status   SARS Coronavirus 2 Ag NEGATIVE NEGATIVE Final    Comment:  (NOTE) SARS-CoV-2 antigen NOT DETECTED.  Negative results are presumptive.  Negative results do not preclude SARS-CoV-2 infection and should not be used as the sole basis for treatment or other patient management decisions, including infection  control decisions, particularly in the presence of clinical signs and  symptoms consistent with COVID-19, or in those who have been in contact with the virus.  Negative results must be combined with clinical observations, patient history, and epidemiological information. The expected result is Negative. Fact Sheet for Patients: PodPark.tn Fact Sheet for Healthcare Providers: GiftContent.is This test is not yet approved or cleared by the [place] FDA and  has been authorized for detection and/or diagnosis of SARS-CoV-2 by FDA under an Emergency Use Authorization (EUA).  This EUA will remain in effect (meaning this test can be used) for the duration of  the COVID-19 de claration under Section 564(b)(1) of the Act, 21 U.S.C. section 360bbb-3(b)(1), unless the authorization is terminated or revoked sooner. Performed at [facility], [address] [place], [place] [address]   Blood culture (routine x 2)     Status: None   Collection Time: 02/04/19 10:41 PM   Specimen: BLOOD  Result Value Ref Range Status   Specimen Description BLOOD RIGHT ANTECUBITAL  Final   Special Requests   Final    BOTTLES DRAWN AEROBIC AND ANAEROBIC Blood Culture results may not be optimal due to an excessive volume of blood received in culture bottles   Culture   Final    NO GROWTH 5 DAYS Performed at [facility], [address] [place], [place] [address]    Report Status 02/03/2019 FINAL  Final  Blood culture (routine x 2)     Status: None   Collection Time: 02/04/19 11:12 PM   Specimen: BLOOD  Result Value Ref Range Status   Specimen Description BLOOD BLOOD RIGHT HAND  Final    Special Requests   Final    BOTTLES DRAWN AEROBIC AND ANAEROBIC Blood Culture adequate volume   Culture   Final    NO GROWTH 5 DAYS Performed at [facility], [address] [place], [place] [address]    Report Status 02/08/2019 FINAL  Final  CULTURE, BLOOD (ROUTINE X 2) w Reflex to ID Panel     Status: None (Preliminary result)   Collection Time: 02/06/19  8:36 AM   Specimen: BLOOD  Result Value Ref Range Status   Specimen Description BLOOD BLOOD LEFT HAND  Final   Special Requests   Final    BOTTLES DRAWN AEROBIC AND ANAEROBIC Blood Culture results may not be optimal due to an excessive volume of blood received in culture bottles   Culture   Final    NO GROWTH 3 DAYS Performed at [facility], [address] [place],  [address]    Report Status PENDING  Incomplete  CULTURE, BLOOD (ROUTINE X 2) w Reflex  to ID Panel     Status: None (Preliminary result)   Collection Time: 02/06/19  8:43 AM   Specimen: BLOOD  Result Value Ref Range Status   Specimen Description BLOOD BLOOD RIGHT HAND  Final   Special Requests   Final    BOTTLES DRAWN AEROBIC AND ANAEROBIC Blood Culture results may not be optimal due to an excessive volume of blood received in culture bottles   Culture   Final    NO GROWTH 3 DAYS Performed at [facility], [address] [place], [place] [address]    Report Status PENDING  Incomplete  SARS CORONAVIRUS 2 (TAT 6-24 HRS) Nasopharyngeal Nasopharyngeal Swab     Status: Abnormal   Collection Time: 02/06/19  8:17 PM   Specimen: Nasopharyngeal Swab  Result Value Ref Range Status   SARS Coronavirus 2 POSITIVE (A) NEGATIVE Final    Comment: RESULT CALLED TO, READ BACK BY AND VERIFIED WITH: Gayland Curry RN 11:00 02/07/19 ([email or web address]) (NOTE) SARS-CoV-2 target nucleic acids are DETECTED. The SARS-CoV-2 RNA is generally detectable in upper and lower respiratory specimens during the acute phase of infection. Positive results are indicative  of the presence of SARS-CoV-2 RNA. Clinical correlation with patient history and other diagnostic information is  necessary to determine patient infection status. Positive results do not rule out bacterial infection or co-infection with other viruses.  The expected result is Negative. Fact Sheet for Patients: SugarRoll.be Fact Sheet for Healthcare Providers: https://www.woods-mathews.com/ This test is not yet approved or cleared by the [place] FDA and  has been authorized for detection and/or diagnosis of SARS-CoV-2 by FDA under an Emergency Use Authorization (EUA). This EUA will remain  in effect (meaning this test can be used) for  the duration of the COVID-19 declaration under Section 564(b)(1) of the Act, 21 U.S.C. section 360bbb-3(b)(1), unless the authorization is terminated or revoked sooner. Performed at [facility], [place] [address], [place], [place] [address]          Radiology Studies: No results found.      Scheduled Meds:  vitamin C  500 mg Oral Daily   aspirin EC  81 mg Oral Daily   atorvastatin  10 mg Oral Daily   budesonide  1 puff Inhalation BID   cholecalciferol  2,000 Units Oral Daily   dexamethasone (DECADRON) injection  6 mg Intravenous Daily   enoxaparin (LOVENOX) injection  40 mg Subcutaneous QHS   fluticasone  2 spray Each Nare Daily   Ipratropium-Albuterol  1 puff Inhalation Q6H   mouth rinse  15 mL Mouth Rinse BID   pantoprazole  40 mg Oral Daily   terazosin  1 mg Oral QHS   vitamin B-12  500 mcg Oral Daily   zinc sulfate  220 mg Oral Daily   Continuous Infusions:  remdesivir 100 mg in NS 100 mL 100 mg (02/12/2019 0823)   tocilizumab (ACTEMRA) IV       LOS: 5 days    Time spent: 35-40 minutes    Ezekiel Slocumb, DO [facility]   If 7PM-7AM, please contact night-coverage [email or web address] Password [facility] 01/28/2019, 8:58 AM

## 2019-02-10 ENCOUNTER — Inpatient Hospital Stay (HOSPITAL_COMMUNITY): Payer: Medicare HMO

## 2019-02-10 DIAGNOSIS — R7989 Other specified abnormal findings of blood chemistry: Secondary | ICD-10-CM

## 2019-02-10 LAB — COMPREHENSIVE METABOLIC PANEL
ALT: 58 U/L — ABNORMAL HIGH (ref 0–44)
AST: 65 U/L — ABNORMAL HIGH (ref 15–41)
Albumin: 3.3 g/dL — ABNORMAL LOW (ref 3.5–5.0)
Alkaline Phosphatase: 52 U/L (ref 38–126)
Anion gap: 16 — ABNORMAL HIGH (ref 5–15)
BUN: 22 mg/dL (ref 8–23)
CO2: 22 mmol/L (ref 22–32)
Calcium: 8.2 mg/dL — ABNORMAL LOW (ref 8.9–10.3)
Chloride: 101 mmol/L (ref 98–111)
Creatinine, Ser: 0.88 mg/dL (ref 0.61–1.24)
GFR calc Af Amer: >60 mL/min (ref >60)
GFR calc non Af Amer: >60 mL/min (ref >60)
Glucose, Bld: 110 mg/dL — ABNORMAL HIGH (ref 70–99)
Potassium: 3.9 mmol/L (ref 3.5–5.1)
Sodium: 139 mmol/L (ref 135–145)
Total Bilirubin: 1 mg/dL (ref 0.3–1.2)
Total Protein: 7.5 g/dL (ref 6.5–8.1)

## 2019-02-10 LAB — CBC WITH DIFFERENTIAL/PLATELET
Abs Immature Granulocytes: 0.04 10*3/uL (ref 0.00–0.07)
Basophils Absolute: 0 10*3/uL (ref 0.0–0.1)
Basophils Relative: 0 %
Eosinophils Absolute: 0 10*3/uL (ref 0.0–0.5)
Eosinophils Relative: 0 %
HCT: 42 % (ref 39.0–52.0)
Hemoglobin: 13.5 g/dL (ref 13.0–17.0)
Immature Granulocytes: 1 %
Lymphocytes Relative: 9 %
Lymphs Abs: 0.6 10*3/uL — ABNORMAL LOW (ref 0.7–4.0)
MCH: 28 pg (ref 26.0–34.0)
MCHC: 32.1 g/dL (ref 30.0–36.0)
MCV: 87 fL (ref 80.0–100.0)
Monocytes Absolute: 0.3 10*3/uL (ref 0.1–1.0)
Monocytes Relative: 4 %
Neutro Abs: 5.8 10*3/uL (ref 1.7–7.7)
Neutrophils Relative %: 86 %
Platelets: 212 10*3/uL (ref 150–400)
RBC: 4.83 MIL/uL (ref 4.22–5.81)
RDW: 13.3 % (ref 11.5–15.5)
WBC: 6.7 10*3/uL (ref 4.0–10.5)
nRBC: 0 % (ref 0.0–0.2)

## 2019-02-10 LAB — GLUCOSE, CAPILLARY
Glucose-Capillary: 129 mg/dL — ABNORMAL HIGH (ref 70–99)
Glucose-Capillary: 163 mg/dL — ABNORMAL HIGH (ref 70–99)
Glucose-Capillary: 144 mg/dL — ABNORMAL HIGH (ref 70–99)
Glucose-Capillary: 127 mg/dL — ABNORMAL HIGH (ref 70–99)

## 2019-02-10 LAB — BRAIN NATRIURETIC PEPTIDE: B Natriuretic Peptide: 57.8 pg/mL (ref 0.0–100.0)

## 2019-02-10 LAB — FERRITIN: Ferritin: 925 ng/mL — ABNORMAL HIGH (ref 24–336)

## 2019-02-10 LAB — C-REACTIVE PROTEIN: CRP: 1.9 mg/dL — ABNORMAL HIGH (ref <1.0)

## 2019-02-10 LAB — D-DIMER, QUANTITATIVE: D-Dimer, Quant: >20 ug/mL-FEU — ABNORMAL HIGH (ref 0.00–0.50)

## 2019-02-10 LAB — PROCALCITONIN: Procalcitonin: <0.1 ng/mL

## 2019-02-10 MED ORDER — AMLODIPINE BESYLATE 10 MG PO TABS
10.0000 mg | ORAL_TABLET | Freq: Every day | ORAL | Status: DC
  Administered 2019-02-10 – 2019-02-11 (×2): 10 mg via ORAL
  Filled 2019-02-10 (×2): qty 1

## 2019-02-10 MED ORDER — ENOXAPARIN SODIUM 120 MG/0.8ML ~~LOC~~ SOLN
1.0000 mg/kg | Freq: Two times a day (BID) | SUBCUTANEOUS | Status: DC
  Administered 2019-02-10 – 2019-02-12 (×6): 110 mg via SUBCUTANEOUS
  Filled 2019-02-10 (×7): qty 0.8

## 2019-02-10 NOTE — Progress Notes (Signed)
1500- Updates given to Significant Other. Patient daughter called. RN encouraged her to call the Significant Other if she wants to get information about her father. Daughter verbalized understanding.

## 2019-02-10 NOTE — Consult Note (Signed)
ANTICOAGULATION CONSULT NOTE  Pharmacy Consult for enoxaparin dose adjustment Indication: DVT  Patient Measurements: Height: 5\' 11"  (180.3 cm) IBW/kg (Calculated) : 75.3  Vital Signs: Temp: 98.2 F (36.8 C) (12/27 0726) Temp Source: Oral (12/27 0726) BP: 139/77 (12/27 0726) Pulse Rate: 89 (12/27 0410)  Labs: Recent Labs    02/08/19 0657 01/17/2019 0420 02/10/19 0157  HGB 13.0 13.4 13.5  HCT 40.3 39.5 42.0  PLT 160 186 212  CREATININE 1.04 0.95 0.88    Estimated Creatinine Clearance: 95.9 mL/min (by C-G formula based on SCr of 0.88 mg/dL).   Medical History: Past Medical History:  Diagnosis Date  . Enlarged prostate   . GERD (gastroesophageal reflux disease)   . Squamous cell lung cancer, left ([place]) 08/2016    Medications:  Scheduled:  . amLODipine  10 mg Oral Daily  . vitamin C  500 mg Oral Daily  . aspirin EC  81 mg Oral Daily  . atorvastatin  10 mg Oral Daily  . docusate sodium  100 mg Oral Daily  . enoxaparin (LOVENOX) injection  55 mg Subcutaneous Q24H  . fluticasone  2 puff Inhalation BID  . Ipratropium-Albuterol  1 puff Inhalation Q6H  . methylPREDNISolone (SOLU-MEDROL) injection  0.5 mg/kg Intravenous Q12H  . pantoprazole  40 mg Oral Daily  . terazosin  1 mg Oral QHS  . zinc sulfate  220 mg Oral Daily    Assessment: 73 y.o.malewith medical history significant ofsquamous cell carcinoma of the lung status post treatment since 2018, BPH, prediabetic, morbid obesity, vitamin D deficiency, vitamin B-12 deficiency,HTN, GERD,CADwho presented to [facility] with shortness of breath was diagnosed with acute hypoxic respiratory failure due to COVID-19 pneumonia.  Was appropriately treated with IV steroids, remdesivir and Actemra thereafter transferred to [facility] hospital on 15 L high flow nasal cannula oxygen. D-Dimer >20 Prior to this morning's D-Dimer level he was being treated with 0.5 mg/kg subQ every 24 hours (last dose 55 mg 12/26 2328). His renal  function has been stable.  Goal of Therapy:  Monitor platelets by anticoagulation protocol: Yes   Plan:   Start enoxaparin 1 mg/kg subQ every 24 hours: dose 110 mg  Daily CBC order in place  Daily CMP order in place  [name] 02/10/2019,9:32 AM

## 2019-02-10 NOTE — Progress Notes (Signed)
VASCULAR LAB PRELIMINARY  PRELIMINARY  PRELIMINARY  PRELIMINARY  Bilateral lower extremity venous duplex completed.    Preliminary report:  See CV proc for preliminary results.   Archana Eckman, RVT 02/10/2019, 12:01 PM

## 2019-02-10 NOTE — Progress Notes (Signed)
PROGRESS NOTE                                                                                                                                                                                                             Patient Demographics:    [name], is a 73 y.o. male, DOB - 05/24/1945, XLK:[id number]  Outpatient Primary MD for the patient is Glean Hess, MD    LOS - 1  Admit date - 02/05/2019    CC - SOB     Brief Narrative  [name]  is a 73 y.o. male,73 y.o.malewith medical history significant ofsquamous cell carcinoma of the lung status post treatment since 2018, BPH, prediabetic, morbid obesity, vitamin D deficiency, vitamin B-12 deficiency, HTN, GERD, CAD who presented to [place] with shortness of breath was diagnosed with acute hypoxic respiratory failure due to COVID-19 pneumonia.  Was appropriately treated with IV steroids, remdesivir and Actemra thereafter transferred to [facility] hospital on 15 L high flow nasal cannula oxygen.      Subjective:    [name] today has, No headache, No chest pain, No abdominal pain - No Nausea, No new weakness tingling or numbness, improved shortness of breath.   Assessment  & Plan :     1. Acute Hypoxic Resp. Failure due to Acute Covid 19 Viral Pneumonitis during the ongoing 2020 Covid 19 Pandemic - he had severe disease, was treated appropriately at [facility] with IV steroids, remdesivir and Actemra.  He seems to have clinically stabilized.  Goal will be to gradually titrate down oxygen and steroids, finished remdesivir course, his D-dimer has shot up quite a bit hence I will transition him to full dose Lovenox and check lower extremity venous duplex.  Encouraged the patient to sit up in chair in the daytime use I-S and flutter valve for pulmonary toiletry and then prone in bed when at night.  SpO2: 97 % O2 Flow Rate (L/min): 15 L/min  Recent  Labs  Lab 02/04/19 2241 02/05/19 0541 02/06/19 0628 02/06/19 0836 02/06/19 2017 02/07/19 0323 02/08/19 0657 02/10/2019 0420 02/10/19 0157  CRP  --   --   --  4.9*  --  4.4* 4.0* 2.2* 1.9*  DDIMER  --   --   --   --   --   --   --   --  >  20.00*  FERRITIN  --   --   --   --   --  1,138* 1,332* 1,374* 925*  PROCALCITON <0.10 0.12 0.10  --   --   --   --   --   --   SARSCOV2NAA  --   --   --   --  POSITIVE*  --   --   --   --     Hepatic Function Latest Ref Rng & Units 02/10/2019 01/23/2019 02/08/2019  Total Protein 6.5 - 8.1 g/dL 7.5 7.4 7.3  Albumin 3.5 - 5.0 g/dL 3.3(L) 3.3(L) 3.3(L)  AST 15 - 41 U/L 65(H) 66(H) 68(H)  ALT 0 - 44 U/L 58(H) 47(H) 39  Alk Phosphatase 38 - 126 U/L 52 46 37(L)  Total Bilirubin 0.3 - 1.2 mg/dL 1.0 0.6 0.6    2.  History of squamous cell cancer in the left lung.  Resume outpatient follow-up and treatment with primary oncologist and PCP.  3.  Obesity.  BMI 34.  Follow with PCP.  4.  GERD.  PPI.  5.  Underlying asthma.  Stable.  Supportive care.  6.  Essential hypertension.  Blood pressure slightly elevated will add Norvasc.      Condition - Extremely Guarded  Family Communication  : Message left for significant other on 02/10/2019 at 9:20 AM.  Code Status : Full  Diet :    Diet Order            Diet regular Room service appropriate? Yes; Fluid consistency: Thin  Diet effective now               Disposition Plan  : TBD  Consults  :  None  Procedures  :    CT - 1. New bilateral ground-glass opacities within LEFT and RIGHT lung are nonspecific with broad differential including pulmonary edema, drug reaction, atypical infection or inflammation. 2. Angular consolidation in the LEFT upper lobe consistent radiation treatment. 3. No evidence of lung cancer recurrence. Abdomen / Pelvis Impression: 1. No evidence of metastatic disease in the abdomen pelvis. 2. Benign appearing cysts of the kidneys and liver. 3.  Aortic  Atherosclerosis  PUD Prophylaxis : PPI  DVT Prophylaxis  :  Lovenox    Lab Results  Component Value Date   PLT 212 02/10/2019    Inpatient Medications  Scheduled Meds: . vitamin C  500 mg Oral Daily  . aspirin EC  81 mg Oral Daily  . atorvastatin  10 mg Oral Daily  . docusate sodium  100 mg Oral Daily  . enoxaparin (LOVENOX) injection  55 mg Subcutaneous Q24H  . fluticasone  2 puff Inhalation BID  . Ipratropium-Albuterol  1 puff Inhalation Q6H  . methylPREDNISolone (SOLU-MEDROL) injection  0.5 mg/kg Intravenous Q12H  . pantoprazole  40 mg Oral Daily  . terazosin  1 mg Oral QHS  . zinc sulfate  220 mg Oral Daily   Continuous Infusions: . remdesivir 100 mg in NS 100 mL 100 mg (02/10/19 0857)   PRN Meds:.acetaminophen, chlorpheniramine-HYDROcodone, guaiFENesin-dextromethorphan  Antibiotics  :    Anti-infectives (From admission, onward)   Start     Dose/Rate Route Frequency Ordered Stop   02/10/19 1000  remdesivir 100 mg in sodium chloride 0.9 % 100 mL IVPB     100 mg 200 mL/hr over 30 Minutes Intravenous Daily 01/21/2019 2120 02/12/19 0959       Time Spent in minutes  30   Lala Lund M.D on 02/10/2019 at 9:20  AM  To page go to [email or web address] - password [email or web address]  [facility] -  Office  [phone]  See all Orders from today for further details    Objective:   Vitals:   02/10/19 0041 02/10/19 0410 02/10/19 0428 02/10/19 0726  BP: (!) 154/78 (!) 143/77  139/77  Pulse: 93 89    Resp: (!) '22 18  20  ' Temp: 98.6 F (37 C) 98.5 F (36.9 C) 98.5 F (36.9 C) 98.2 F (36.8 C)  TempSrc: Oral Oral Oral Oral  SpO2: 97% 97%    Height:        Wt Readings from Last 3 Encounters:  01/26/2019 110.6 kg  02/04/19 117.9 kg  01/14/19 119.3 kg     Intake/Output Summary (Last 24 hours) at 02/10/2019 0920 Last data filed at 02/10/2019 0000 Gross per 24 hour  Intake 300 ml  Output 400 ml  Net -100 ml     Physical Exam  Awake Alert,   No new F.N deficits,  Normal affect [name].AT,PERRAL Supple Neck,No JVD, No cervical lymphadenopathy appriciated.  Symmetrical Chest wall movement, Good air movement bilaterally, CTAB RRR,No Gallops,Rubs or new Murmurs, No Parasternal Heave +ve B.Sounds, Abd Soft, No tenderness, No organomegaly appriciated, No rebound - guarding or rigidity. No Cyanosis, Clubbing or edema, No new Rash or bruise      Data Review:    CBC Recent Labs  Lab 02/06/19 0628 02/07/19 0323 02/08/19 0657 02/02/2019 0420 02/10/19 0157  WBC 5.0 5.0 6.5 7.9 6.7  HGB 12.8* 13.0 13.0 13.4 13.5  HCT 38.6* 38.5* 40.3 39.5 42.0  PLT 114* 124* 160 186 212  MCV 82.0 82.1 86.1 82.3 87.0  MCH 27.2 27.7 27.8 27.9 28.0  MCHC 33.2 33.8 32.3 33.9 32.1  RDW 13.2 13.4 13.3 13.6 13.3  LYMPHSABS 0.4* 0.5* 1.0 0.9 0.6*  MONOABS 0.2 0.3 0.4 0.4 0.3  EOSABS 0.0 0.0 0.0 0.0 0.0  BASOSABS 0.0 0.0 0.0 0.0 0.0    Chemistries  Recent Labs  Lab 02/05/19 0541 02/06/19 0628 02/07/19 0323 02/08/19 0657 02/02/2019 0420 02/10/19 0157  NA 133* 134* 137 141 142 139  K 4.2 3.7 3.8 3.7 4.0 3.9  CL 101 105 105 109 109 101  CO2 23 19* 22 21* 23 22  GLUCOSE 115* 110* 115* 113* 120* 110*  BUN '20 17 14 16 20 22  ' CREATININE 1.46* 1.17 1.21 1.04 0.95 0.88  CALCIUM 8.3* 7.9* 8.1* 8.3* 8.6* 8.2*  MG  --  2.1 2.2 2.4 2.3  --   AST 45*  --  59* 68* 66* 65*  ALT 28  --  30 39 47* 58*  ALKPHOS 46  --  38 37* 46 52  BILITOT 0.9  --  0.7 0.6 0.6 1.0   ------------------------------------------------------------------------------------------------------------------ No results for input(s): CHOL, HDL, LDLCALC, TRIG, CHOLHDL, LDLDIRECT in the last 72 hours.  Lab Results  Component Value Date   HGBA1C 5.8 (H) 01/14/2019   ------------------------------------------------------------------------------------------------------------------ No results for input(s): TSH, T4TOTAL, T3FREE, THYROIDAB in the last 72 hours.  Invalid input(s): FREET3  Cardiac Enzymes No  results for input(s): CKMB, TROPONINI, MYOGLOBIN in the last 168 hours.  Invalid input(s): CK ------------------------------------------------------------------------------------------------------------------ No results found for: BNP  Micro Results Recent Results (from the past 240 hour(s))  SARS Coronavirus 2 Ag (30 min TAT) - Nasal Swab (BD Veritor Kit)     Status: None   Collection Time: 02/04/19  5:56 PM   Specimen: Nasal Swab (BD Veritor Kit)  Result Value Ref Range Status  SARS Coronavirus 2 Ag NEGATIVE NEGATIVE Final    Comment: (NOTE) SARS-CoV-2 antigen NOT DETECTED.  Negative results are presumptive.  Negative results do not preclude SARS-CoV-2 infection and should not be used as the sole basis for treatment or other patient management decisions, including infection  control decisions, particularly in the presence of clinical signs and  symptoms consistent with COVID-19, or in those who have been in contact with the virus.  Negative results must be combined with clinical observations, patient history, and epidemiological information. The expected result is Negative. Fact Sheet for Patients: PodPark.tn Fact Sheet for Healthcare Providers: GiftContent.is This test is not yet approved or cleared by the [place] FDA and  has been authorized for detection and/or diagnosis of SARS-CoV-2 by FDA under an Emergency Use Authorization (EUA).  This EUA will remain in effect (meaning this test can be used) for the duration of  the COVID-19 de claration under Section 564(b)(1) of the Act, 21 U.S.C. section 360bbb-3(b)(1), unless the authorization is terminated or revoked sooner. Performed at [facility], [address], [place], [place] [address]   Blood culture (routine x 2)     Status: None   Collection Time: 02/04/19 10:41 PM   Specimen: BLOOD  Result Value Ref Range Status   Specimen Description  BLOOD RIGHT ANTECUBITAL  Final   Special Requests   Final    BOTTLES DRAWN AEROBIC AND ANAEROBIC Blood Culture results may not be optimal due to an excessive volume of blood received in culture bottles   Culture   Final    NO GROWTH 5 DAYS Performed at [facility], [address] [place], [place] [address]    Report Status 02/08/2019 FINAL  Final  Blood culture (routine x 2)     Status: None   Collection Time: 02/04/19 11:12 PM   Specimen: BLOOD  Result Value Ref Range Status   Specimen Description BLOOD BLOOD RIGHT HAND  Final   Special Requests   Final    BOTTLES DRAWN AEROBIC AND ANAEROBIC Blood Culture adequate volume   Culture   Final    NO GROWTH 5 DAYS Performed at [facility], [address] [place], [place] [address]    Report Status 02/01/2019 FINAL  Final  CULTURE, BLOOD (ROUTINE X 2) w Reflex to ID Panel     Status: None (Preliminary result)   Collection Time: 02/06/19  8:36 AM   Specimen: BLOOD  Result Value Ref Range Status   Specimen Description BLOOD BLOOD LEFT HAND  Final   Special Requests   Final    BOTTLES DRAWN AEROBIC AND ANAEROBIC Blood Culture results may not be optimal due to an excessive volume of blood received in culture bottles   Culture   Final    NO GROWTH 4 DAYS Performed at [facility], [address] [place], [place] [address]    Report Status PENDING  Incomplete  CULTURE, BLOOD (ROUTINE X 2) w Reflex to ID Panel     Status: None (Preliminary result)   Collection Time: 02/06/19  8:43 AM   Specimen: BLOOD  Result Value Ref Range Status   Specimen Description BLOOD BLOOD RIGHT HAND  Final   Special Requests   Final    BOTTLES DRAWN AEROBIC AND ANAEROBIC Blood Culture results may not be optimal due to an excessive volume of blood received in culture bottles   Culture   Final    NO GROWTH 4 DAYS Performed at [facility], [address], [place] [address]    Report Status PENDING   Incomplete  SARS CORONAVIRUS 2 (TAT 6-24 HRS) Nasopharyngeal Nasopharyngeal Swab     Status: Abnormal   Collection Time: 02/06/19  8:17 PM   Specimen: Nasopharyngeal Swab  Result Value Ref Range Status   SARS Coronavirus 2 POSITIVE (A) NEGATIVE Final    Comment: RESULT CALLED TO, READ BACK BY AND VERIFIED WITH: Gayland Curry RN 11:00 02/07/19 ([email or web address]) (NOTE) SARS-CoV-2 target nucleic acids are DETECTED. The SARS-CoV-2 RNA is generally detectable in upper and lower respiratory specimens during the acute phase of infection. Positive results are indicative of the presence of SARS-CoV-2 RNA. Clinical correlation with patient history and other diagnostic information is  necessary to determine patient infection status. Positive results do not rule out bacterial infection or co-infection with other viruses.  The expected result is Negative. Fact Sheet for Patients: SugarRoll.be Fact Sheet for Healthcare Providers: https://www.woods-mathews.com/ This test is not yet approved or cleared by the [place] FDA and  has been authorized for detection and/or diagnosis of SARS-CoV-2 by FDA under an Emergency Use Authorization (EUA). This EUA will remain  in effect (meaning this test can be used) for  the duration of the COVID-19 declaration under Section 564(b)(1) of the Act, 21 U.S.C. section 360bbb-3(b)(1), unless the authorization is terminated or revoked sooner. Performed at [facility], [address], [place], [place] [address]     Radiology Reports DG Chest 2 View  Result Date: 02/04/2019 CLINICAL DATA:  73 year old male with fever and cough. EXAM: CHEST - 2 VIEW COMPARISON:  Chest radiograph dated 10/20/2017. FINDINGS: Right-sided Port-A-Cath in similar position. There is an area of density in the left upper lobe similar to prior radiograph, likely scarring. No consolidative changes. There is no pleural effusion or pneumothorax. Stable  cardiac silhouette. No acute osseous pathology. IMPRESSION: 1. No acute cardiopulmonary process. 2. Stable left upper lobe scarring. Electronically Signed   By: Anner Crete M.D.   On: 02/04/2019 18:47   CT CHEST W CONTRAST  Result Date: 02/06/2019 CLINICAL DATA:  Squamous cell carcinoma along. Evaluate treatment response. Fever and cough. EXAM: CT CHEST, ABDOMEN, AND PELVIS WITH CONTRAST TECHNIQUE: Multidetector CT imaging of the chest, abdomen and pelvis was performed following the standard protocol during bolus administration of intravenous contrast. CONTRAST:  183m OMNIPAQUE IOHEXOL 300 MG/ML  SOLN COMPARISON:  CT 12/25/2018 FINDINGS: CT CHEST FINDINGS Cardiovascular: Port in the anterior chest wall with tip in distal SVC. Coronary artery calcification and aortic atherosclerotic calcification. Mediastinum/Nodes: No axillary supraclavicular adenopathy. No mediastinal hilar adenopathy. No pericardial effusion. Esophagus normal. Lungs/Pleura: Band of angular consolidation with air bronchograms in LEFT upper lobe unchanged from prior consistent with radiation treatment effect. There is increased ground-glass opacities superimposed on centrilobular emphysema in the RIGHT upper lobe and posterior aspect of the LEFT lower lobe. No suspicious nodularity. No focal consolidation. There is diffuse ground-glass opacities in the lower lobes additionally. Musculoskeletal: Low-density lesion in the RIGHT hepatic lobe consistent benign cysts. Normal gallbladder. CT ABDOMEN AND PELVIS FINDINGS Hepatobiliary: No focal hepatic lesion. No biliary ductal dilatation. Gallbladder is normal. Common bile duct is normal. Pancreas: Pancreas is normal. No ductal dilatation. No pancreatic inflammation. Spleen: Normal spleen Adrenals/urinary tract: Adrenal glands normal. Low-density lesions in the kidneys likely represent benign cysts. Ureters and bladder normal. Stomach/Bowel: Stomach, small bowel, appendix, and cecum are normal.  The colon and rectosigmoid colon are normal. Vascular/Lymphatic: Abdominal aorta is normal caliber with atherosclerotic calcification. There is  no retroperitoneal or periportal lymphadenopathy. No pelvic lymphadenopathy. Reproductive: Prostate normal Other: No peritoneal metastasis Musculoskeletal: No aggressive osseous lesion. IMPRESSION: Chest Impression: 1. New bilateral ground-glass opacities within LEFT and RIGHT lung are nonspecific with broad differential including pulmonary edema, drug reaction, atypical infection or inflammation. 2. Angular consolidation in the LEFT upper lobe consistent radiation treatment. 3. No evidence of lung cancer recurrence. Abdomen / Pelvis Impression: 1. No evidence of metastatic disease in the abdomen pelvis. 2. Benign appearing cysts of the kidneys and liver. 3.  Aortic Atherosclerosis (ICD10-I70.0). Electronically Signed   By: Suzy Bouchard M.D.   On: 02/06/2019 17:41   CT ABDOMEN PELVIS W CONTRAST  Result Date: 02/06/2019 CLINICAL DATA:  Squamous cell carcinoma along. Evaluate treatment response. Fever and cough. EXAM: CT CHEST, ABDOMEN, AND PELVIS WITH CONTRAST TECHNIQUE: Multidetector CT imaging of the chest, abdomen and pelvis was performed following the standard protocol during bolus administration of intravenous contrast. CONTRAST:  127m OMNIPAQUE IOHEXOL 300 MG/ML  SOLN COMPARISON:  CT 12/25/2018 FINDINGS: CT CHEST FINDINGS Cardiovascular: Port in the anterior chest wall with tip in distal SVC. Coronary artery calcification and aortic atherosclerotic calcification. Mediastinum/Nodes: No axillary supraclavicular adenopathy. No mediastinal hilar adenopathy. No pericardial effusion. Esophagus normal. Lungs/Pleura: Band of angular consolidation with air bronchograms in LEFT upper lobe unchanged from prior consistent with radiation treatment effect. There is increased ground-glass opacities superimposed on centrilobular emphysema in the RIGHT upper lobe and posterior  aspect of the LEFT lower lobe. No suspicious nodularity. No focal consolidation. There is diffuse ground-glass opacities in the lower lobes additionally. Musculoskeletal: Low-density lesion in the RIGHT hepatic lobe consistent benign cysts. Normal gallbladder. CT ABDOMEN AND PELVIS FINDINGS Hepatobiliary: No focal hepatic lesion. No biliary ductal dilatation. Gallbladder is normal. Common bile duct is normal. Pancreas: Pancreas is normal. No ductal dilatation. No pancreatic inflammation. Spleen: Normal spleen Adrenals/urinary tract: Adrenal glands normal. Low-density lesions in the kidneys likely represent benign cysts. Ureters and bladder normal. Stomach/Bowel: Stomach, small bowel, appendix, and cecum are normal. The colon and rectosigmoid colon are normal. Vascular/Lymphatic: Abdominal aorta is normal caliber with atherosclerotic calcification. There is no retroperitoneal or periportal lymphadenopathy. No pelvic lymphadenopathy. Reproductive: Prostate normal Other: No peritoneal metastasis Musculoskeletal: No aggressive osseous lesion. IMPRESSION: Chest Impression: 1. New bilateral ground-glass opacities within LEFT and RIGHT lung are nonspecific with broad differential including pulmonary edema, drug reaction, atypical infection or inflammation. 2. Angular consolidation in the LEFT upper lobe consistent radiation treatment. 3. No evidence of lung cancer recurrence. Abdomen / Pelvis Impression: 1. No evidence of metastatic disease in the abdomen pelvis. 2. Benign appearing cysts of the kidneys and liver. 3.  Aortic Atherosclerosis (ICD10-I70.0). Electronically Signed   By: SSuzy BouchardM.D.   On: 02/06/2019 17:41

## 2019-02-11 LAB — COMPREHENSIVE METABOLIC PANEL
ALT: 80 U/L — ABNORMAL HIGH (ref 0–44)
AST: 61 U/L — ABNORMAL HIGH (ref 15–41)
Albumin: 3.5 g/dL (ref 3.5–5.0)
Alkaline Phosphatase: 74 U/L (ref 38–126)
Anion gap: 12 (ref 5–15)
BUN: 29 mg/dL — ABNORMAL HIGH (ref 8–23)
CO2: 24 mmol/L (ref 22–32)
Calcium: 8.7 mg/dL — ABNORMAL LOW (ref 8.9–10.3)
Chloride: 105 mmol/L (ref 98–111)
Creatinine, Ser: 1.12 mg/dL (ref 0.61–1.24)
GFR calc Af Amer: >60 mL/min (ref >60)
GFR calc non Af Amer: >60 mL/min (ref >60)
Glucose, Bld: 134 mg/dL — ABNORMAL HIGH (ref 70–99)
Potassium: 3.7 mmol/L (ref 3.5–5.1)
Sodium: 141 mmol/L (ref 135–145)
Total Bilirubin: 0.7 mg/dL (ref 0.3–1.2)
Total Protein: 7.7 g/dL (ref 6.5–8.1)

## 2019-02-11 LAB — D-DIMER, QUANTITATIVE: D-Dimer, Quant: >20 ug/mL-FEU — ABNORMAL HIGH (ref 0.00–0.50)

## 2019-02-11 LAB — CBC WITH DIFFERENTIAL/PLATELET
Abs Immature Granulocytes: 0.08 10*3/uL — ABNORMAL HIGH (ref 0.00–0.07)
Basophils Absolute: 0 10*3/uL (ref 0.0–0.1)
Basophils Relative: 0 %
Eosinophils Absolute: 0 10*3/uL (ref 0.0–0.5)
Eosinophils Relative: 0 %
HCT: 45.1 % (ref 39.0–52.0)
Hemoglobin: 14.3 g/dL (ref 13.0–17.0)
Immature Granulocytes: 1 %
Lymphocytes Relative: 6 %
Lymphs Abs: 0.8 10*3/uL (ref 0.7–4.0)
MCH: 27.7 pg (ref 26.0–34.0)
MCHC: 31.7 g/dL (ref 30.0–36.0)
MCV: 87.2 fL (ref 80.0–100.0)
Monocytes Absolute: 0.3 10*3/uL (ref 0.1–1.0)
Monocytes Relative: 3 %
Neutro Abs: 11.5 10*3/uL — ABNORMAL HIGH (ref 1.7–7.7)
Neutrophils Relative %: 90 %
Platelets: 293 10*3/uL (ref 150–400)
RBC: 5.17 MIL/uL (ref 4.22–5.81)
RDW: 13.4 % (ref 11.5–15.5)
WBC: 12.7 10*3/uL — ABNORMAL HIGH (ref 4.0–10.5)
nRBC: 0 % (ref 0.0–0.2)

## 2019-02-11 LAB — CULTURE, BLOOD (ROUTINE X 2)
Culture: NO GROWTH
Culture: NO GROWTH

## 2019-02-11 LAB — MAGNESIUM: Magnesium: 2.4 mg/dL (ref 1.7–2.4)

## 2019-02-11 LAB — GLUCOSE, CAPILLARY
Glucose-Capillary: 132 mg/dL — ABNORMAL HIGH (ref 70–99)
Glucose-Capillary: 218 mg/dL — ABNORMAL HIGH (ref 70–99)
Glucose-Capillary: 150 mg/dL — ABNORMAL HIGH (ref 70–99)

## 2019-02-11 LAB — C-REACTIVE PROTEIN: CRP: 0.9 mg/dL

## 2019-02-11 LAB — BRAIN NATRIURETIC PEPTIDE: B Natriuretic Peptide: 51.9 pg/mL (ref 0.0–100.0)

## 2019-02-11 LAB — PROCALCITONIN: Procalcitonin: <0.1 ng/mL

## 2019-02-11 MED ORDER — FUROSEMIDE 10 MG/ML IJ SOLN
40.0000 mg | Freq: Once | INTRAMUSCULAR | Status: AC
  Administered 2019-02-11: 40 mg via INTRAVENOUS
  Filled 2019-02-11: qty 4

## 2019-02-11 MED ORDER — METOPROLOL TARTRATE 25 MG PO TABS
50.0000 mg | ORAL_TABLET | Freq: Two times a day (BID) | ORAL | Status: DC
  Administered 2019-02-11 – 2019-02-13 (×6): 50 mg via ORAL
  Filled 2019-02-11 (×5): qty 1
  Filled 2019-02-11: qty 2

## 2019-02-11 NOTE — Progress Notes (Signed)
Patient sitting in chair,states waking up and having a harder time breathing.  Encouraged to use I.S. and flutter valve while awake with teaching about benefits.  Currently on 10 HFNC with sats 88-92% range.

## 2019-02-11 NOTE — Evaluation (Addendum)
Occupational Therapy Evaluation Patient Details Name: [name] MRN: [id number] DOB: 16-Jun-1945 Today's Date: 02/11/2019    History of Present Illness 73 yo male presented to [place] with shortness of breath was diagnosed with acute hypoxic respiratory failure due to COVID-19 pneumonia. PMH including squamous cell carcinoma of the lung status post treatment since 2018, BPH, prediabetic, morbid obesity, vitamin D deficiency, vitamin B-12 deficiency,HTN, GERD, andCAD.   Clinical Impression   PTA, pt was living with his significant other and was independent; enjoys working on his cars. Pt currently requiring Min Guard A for ADLs and Min A for functional mobility due to mild balance deficits. Pt SpO2 dropping to 75% on 10L during toileting; required 15L to complete ADLs maintaining SpO2 >78% with cues for purse lip breathing. Pt requiring seated rest break to return to 90% on 15L and then 12L O2. Pt would benefit from further acute OT to facilitate safe dc. Recommend dc to home with HHOT for further OT to optimize safety, independence with ADLs, and return to PLOF.      Follow Up Recommendations  Home health OT;Supervision/Assistance - 24 hour(May progress to home without HHR)    Equipment Recommendations  3 in 1 bedside commode(As shower seat)    Recommendations for Other Services PT consult     Precautions / Restrictions        Mobility Bed Mobility               General bed mobility comments: In recliner upon arrival  Transfers Overall transfer level: Needs assistance Equipment used: None Transfers: Sit to/from Stand Sit to Stand: Min guard         General transfer comment: Min Guard A for safety    Balance Overall balance assessment: Mild deficits observed, not formally tested                                         ADL either performed or assessed with clinical judgement   ADL Overall ADL's : Needs  assistance/impaired Eating/Feeding: Independent;Sitting   Grooming: Set up;Wash/dry hands;Sitting Grooming Details (indicate cue type and reason): Set up to wash hands with hand sanitizer while seated at EOB Upper Body Bathing: Set up;Supervision/ safety;Sitting   Lower Body Bathing: Min guard;Sit to/from stand   Upper Body Dressing : Set up;Supervision/safety;Sitting   Lower Body Dressing: Min guard;Sit to/from stand Lower Body Dressing Details (indicate cue type and reason): Able to adjust socks while seated at recliner Toilet Transfer: Min guard;Ambulation(simulated to recliner) Armed forces technical officer Details (indicate cue type and reason): Min Guard A for safety Toileting- Water quality scientist and Hygiene: Min guard;Sit to/from stand Toileting - Clothing Manipulation Details (indicate cue type and reason): Min Guard A for standing at toilet to urinate.     Functional mobility during ADLs: Min guard;Minimal assistance General ADL Comments: Pt presenting with decreased activity tolerance. Highly motivated     Vision         Perception     Praxis      Pertinent Vitals/Pain Pain Assessment: No/denies pain     Hand Dominance Right   Extremity/Trunk Assessment Upper Extremity Assessment Upper Extremity Assessment: Overall WFL for tasks assessed   Lower Extremity Assessment Lower Extremity Assessment: Defer to PT evaluation   Cervical / Trunk Assessment Cervical / Trunk Assessment: Normal   Communication Communication Communication: No difficulties   Cognition Arousal/Alertness: Awake/alert Behavior During Therapy:  WFL for tasks assessed/performed Overall Cognitive Status: Within Functional Limits for tasks assessed                                     General Comments  SpO2 90s on 12L O2 while seated. Initating ADLs on 10L O2 and at 90%; slowly dropping to 75% and cued for purse lip breathing. Elevated to 15L and pt maintaining SpO2 >78% on 15L with purse  lip breathing. Requiring seated rest break to return to 90s on 15L. Returing pt to 12L at ned of session and he maintained SpO2 >88%. HR 110s. RR 10-30.    Exercises Exercises: Other exercises Other Exercises Other Exercises: Educated on use of IS and flutter valve   Shoulder Instructions      Home Living Family/patient expects to be discharged to:: Private residence Living Arrangements: Spouse/significant other(Girlfriend) Available Help at Discharge: Family;Available 24 hours/day Type of Home: House Home Access: Stairs to enter [employer] of Steps: 5 Entrance Stairs-Rails: Right;Can reach both Home Layout: One level     Bathroom Shower/Tub: Teacher, early years/pre: Standard     Home Equipment: None          Prior Functioning/Environment Level of Independence: Independent                 OT Problem List: Decreased strength;Decreased range of motion;Decreased activity tolerance;Impaired balance (sitting and/or standing);Decreased knowledge of use of DME or AE;Decreased knowledge of precautions      OT Treatment/Interventions: Self-care/ADL training;Therapeutic exercise;DME and/or AE instruction;Energy conservation;Therapeutic activities;Patient/family education    OT Goals(Current goals can be found in the care plan section) Acute Rehab OT Goals Patient Stated Goal: Go home OT Goal Formulation: With patient Time For Goal Achievement: 02/25/19 Potential to Achieve Goals: Good  OT Frequency: Min 3X/week   Barriers to D/C:            Co-evaluation              AM-PAC OT "6 Clicks" Daily Activity     Outcome Measure Help from another person eating meals?: None Help from another person taking care of personal grooming?: A Little Help from another person toileting, which includes using toliet, bedpan, or urinal?: A Little Help from another person bathing (including washing, rinsing, drying)?: A Little Help from another person to  put on and taking off regular upper body clothing?: A Little Help from another person to put on and taking off regular lower body clothing?: A Little 6 Click Score: 19   End of Session Equipment Utilized During Treatment: Oxygen(10-15L) Nurse Communication: Mobility status  Activity Tolerance: Patient tolerated treatment well Patient left: in chair;with call bell/phone within reach  OT Visit Diagnosis: Unsteadiness on feet (R26.81);Other abnormalities of gait and mobility (R26.89);Muscle weakness (generalized) (M62.81)                Time: 6599-3570 OT Time Calculation (min): 24 min Charges:  OT General Charges $OT Visit: 1 Visit OT Evaluation $OT Eval Moderate Complexity: 1 Mod OT Treatments $Self Care/Home Management : 8-22 mins  Emmalene Kattner MSOT, OTR/L Acute Rehab Pager: [phone] Office: [place] 02/11/2019, 11:16 AM

## 2019-02-11 NOTE — Evaluation (Signed)
Physical Therapy Evaluation Patient Details Name: [name] MRN: [id number] DOB: [birth year] Today's Date: 02/11/2019   History of Present Illness  Brief Narrative  [name]  is a 73 y.o. male,73 y.o. male with medical history significant of squamous cell carcinoma of the lung status post treatment since 2018, BPH, prediabetic, morbid obesity, vitamin D deficiency, vitamin B-12 deficiency, HTN, GERD, CAD who presented to [place] with shortness of breath was diagnosed with acute hypoxic respiratory failure due to COVID-19 pneumonia.  Was appropriately treated with IV steroids, remdesivir and Actemra thereafter transferred to [facility] hospital on 15 L high flow nasal cannula oxygen.  Clinical Impression  This very pleasant 73 year old male is cooperative and receptive to PT. He presents with generalized weakness, and reduced energy conservation- currently on High Flow (12 LPM, [place]), but was not on oxygen at home. He was independent in PLOF. Should benefit from ongoing PT to further address all goals as identified.      Follow Up Recommendations Home health PT    Equipment Recommendations  None recommended by PT    Recommendations for Other Services       Precautions / Restrictions Precautions Precautions: Fall Precaution Comments: Related to general weakness and reduced energy conservation. Restrictions Weight Bearing Restrictions: No      Mobility  Bed Mobility               General bed mobility comments: Did not assess bed mobility during evaluation- was in BS chair when PT arrived and patient preferred to stay in chair as he prepared for luch  Transfers Overall transfer level: Needs assistance Equipment used: Rolling walker (2 wheeled) Transfers: Sit to/from Stand Sit to Stand: Min guard         General transfer comment: Min Guard A for safety  Ambulation/Gait Ambulation/Gait assistance: Min guard   Assistive device: Rolling walker (2 wheeled) Gait  Pattern/deviations: WFL(Within Functional Limits)     General Gait Details: mildly anatalgic  Stairs            Wheelchair Mobility    Modified Rankin (Stroke Patients Only)       Balance Overall balance assessment: Mild deficits observed, not formally tested                                           Pertinent Vitals/Pain Pain Assessment: No/denies pain    Home Living Family/patient expects to be discharged to:: Private residence Living Arrangements: Spouse/significant other Available Help at Discharge: Family;Available 24 hours/day Type of Home: House Home Access: Level entry Entrance Stairs-Rails: Right;Can reach both Entrance Stairs-Number of Steps: 5 Home Layout: One level Home Equipment: Grab bars - tub/shower      Prior Function Level of Independence: Independent         Comments: 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     Hand Dominance   Dominant Hand: Right    Extremity/Trunk Assessment   Upper Extremity Assessment Upper Extremity Assessment: Defer to OT evaluation    Lower Extremity Assessment Lower Extremity Assessment: Defer to PT evaluation;Overall [facility] for tasks assessed;Generalized weakness    Cervical / Trunk Assessment Cervical / Trunk Assessment: Normal  Communication   Communication: No difficulties  Cognition Arousal/Alertness: Awake/alert Behavior During Therapy: WFL for tasks assessed/performed Overall Cognitive Status: Within Functional Limits for  tasks assessed                                        General Comments General comments (skin integrity, edema, etc.): Grossly good tone and turgor. No edema noted.    Exercises General Exercises - Lower Extremity Ankle Circles/Pumps: AROM Quad Sets: AROM Short Arc Quad: AROM Long Arc Quad: AROM Hip Flexion/Marching: AROM Other Exercises Other Exercises: Reviewed inspirometer and  flutter exerciser with good return demo. With inspirometer he was able to achieve 1200 5 out of 10 attempts, the other 5 grossly 1000-1100   Assessment/Plan    PT Assessment Patient needs continued PT services  PT Problem List Decreased strength;Decreased activity tolerance;Decreased safety awareness;Other (comment)(Currently on 12 L HF, and was not using any oxygen at home.)       PT Treatment Interventions Gait training;Stair training;Functional mobility training;Patient/family education;Therapeutic activities;Therapeutic exercise    PT Goals (Current goals can be found in the Care Plan section)  Acute Rehab PT Goals Patient Stated Goal: Just wants to return to home and be able to get "back to work as quickly as he safely can" PT Goal Formulation: With patient Time For Goal Achievement: 02/25/19 Potential to Achieve Goals: Good    Frequency     Barriers to discharge        Co-evaluation               AM-PAC PT "6 Clicks" Mobility  Outcome Measure Help needed turning from your back to your side while in a flat bed without using bedrails?: A Little Help needed moving from lying on your back to sitting on the side of a flat bed without using bedrails?: A Little Help needed moving to and from a bed to a chair (including a wheelchair)?: A Little Help needed standing up from a chair using your arms (e.g., wheelchair or bedside chair)?: A Little Help needed to walk in hospital room?: A Little Help needed climbing 3-5 steps with a railing? : A Lot 6 Click Score: 17    End of Session Equipment Utilized During Treatment: Gait belt Activity Tolerance: Patient tolerated treatment well Patient left: in chair   PT Visit Diagnosis: Muscle weakness (generalized) (M62.81);Unsteadiness on feet (R26.81)    Time: 6503-5465 PT Time Calculation (min) (ACUTE ONLY): 38 min   Charges:   PT Evaluation $PT Eval Moderate Complexity: 1 Mod PT Treatments $Gait Training: 8-22  mins $Therapeutic Exercise: 8-22 mins          [name], PT # [phone] CGV cell    [name] 02/11/2019, 1:17 PM

## 2019-02-11 NOTE — Progress Notes (Signed)
1729- Updates given to significant Other. RN encouraged Significant Other update the rest of the family

## 2019-02-11 NOTE — Progress Notes (Signed)
PROGRESS NOTE                                                                                                                                                                                                             Patient Demographics:    [name], is a 73 y.o. male, DOB - 02/07/1946, KPV:[id number]  Outpatient Primary MD for the patient is Glean Hess, MD    LOS - 2  Admit date - 02/04/2019    CC - SOB     Brief Narrative  [name]  is a 73 y.o. male,73 y.o.malewith medical history significant ofsquamous cell carcinoma of the lung status post treatment since 2018, BPH, prediabetic, morbid obesity, vitamin D deficiency, vitamin B-12 deficiency, HTN, GERD, CAD who presented to [place] with shortness of breath was diagnosed with acute hypoxic respiratory failure due to COVID-19 pneumonia.  Was appropriately treated with IV steroids, remdesivir and Actemra thereafter transferred to [facility] hospital on 15 L high flow nasal cannula oxygen.      Subjective:   Patient in bed, appears comfortable, denies any headache, no fever, no chest pain or pressure, improving shortness of breath , no abdominal pain. No focal weakness.    Assessment  & Plan :     1. Acute Hypoxic Resp. Failure due to Acute Covid 19 Viral Pneumonitis during the ongoing 2020 Covid 19 Pandemic - he had severe disease, was treated appropriately at [facility] with IV steroids, remdesivir and Actemra.  He seems to have clinically stabilized.  Goal will be to gradually titrate down oxygen and steroids, finished remdesivir course, D-dimer is at the max level despite full dose Lovenox and lower extremity DVT negative.  CT chest was negative, cannot rule out pelvic bed DVT.  Continue full dose Lovenox till D-dimer starts to trend down.  Encouraged the patient to sit up in chair in the daytime use I-S and flutter valve for pulmonary toiletry  and then prone in bed when at night.  SpO2: (!) 88 % O2 Flow Rate (L/min): 11 L/min  Recent Labs  Lab 02/04/19 2241 02/05/19 0541 02/06/19 0628 02/06/19 2017 02/07/19 0323 02/08/19 0657 01/22/2019 0420 02/10/19 0157 02/11/19 0407  CRP  --   --   --   --  4.4* 4.0* 2.2* 1.9* 0.9  DDIMER  --   --   --   --   --   --   --  >  20.00* >20.00*  FERRITIN  --   --   --   --  1,138* 1,332* 1,374* 925*  --   BNP  --   --   --   --   --   --   --  57.8 51.9  PROCALCITON <0.10 0.12 0.10  --   --   --   --  <0.10 <0.10  SARSCOV2NAA  --   --   --  POSITIVE*  --   --   --   --   --     Hepatic Function Latest Ref Rng & Units 02/11/2019 02/10/2019 02/08/2019  Total Protein 6.5 - 8.1 g/dL 7.7 7.5 7.4  Albumin 3.5 - 5.0 g/dL 3.5 3.3(L) 3.3(L)  AST 15 - 41 U/L 61(H) 65(H) 66(H)  ALT 0 - 44 U/L 80(H) 58(H) 47(H)  Alk Phosphatase 38 - 126 U/L 74 52 46  Total Bilirubin 0.3 - 1.2 mg/dL 0.7 1.0 0.6    2.  History of squamous cell cancer in the left lung.  Resume outpatient follow-up and treatment with primary oncologist and PCP.  3.  Obesity.  BMI 34.  Follow with PCP.  4.  GERD.  PPI.  5.  Underlying asthma.  Stable.  Supportive care.  6.  Essential hypertension.  placed on low-dose beta-blocker will monitor.      Condition - Extremely Guarded  Family Communication  : Message left for significant other on 02/10/2019 at 9:20 AM.  Code Status : Full  Diet :    Diet Order            Diet regular Room service appropriate? Yes; Fluid consistency: Thin  Diet effective now               Disposition Plan  : TBD  Consults  :  None  Procedures  :    Leg ultrasound venous.  Negative.    CT - 1. New bilateral ground-glass opacities within LEFT and RIGHT lung are nonspecific with broad differential including pulmonary edema, drug reaction, atypical infection or inflammation. 2. Angular consolidation in the LEFT upper lobe consistent radiation treatment. 3. No evidence of lung cancer  recurrence. Abdomen / Pelvis Impression: 1. No evidence of metastatic disease in the abdomen pelvis. 2. Benign appearing cysts of the kidneys and liver. 3.  Aortic Atherosclerosis  PUD Prophylaxis : PPI  DVT Prophylaxis  :  Lovenox    Lab Results  Component Value Date   PLT 293 02/11/2019    Inpatient Medications  Scheduled Meds: . vitamin C  500 mg Oral Daily  . aspirin EC  81 mg Oral Daily  . atorvastatin  10 mg Oral Daily  . docusate sodium  100 mg Oral Daily  . enoxaparin (LOVENOX) injection  1 mg/kg Subcutaneous Q12H  . fluticasone  2 puff Inhalation BID  . Ipratropium-Albuterol  1 puff Inhalation Q6H  . methylPREDNISolone (SOLU-MEDROL) injection  0.5 mg/kg Intravenous Q12H  . metoprolol tartrate  50 mg Oral BID  . pantoprazole  40 mg Oral Daily  . terazosin  1 mg Oral QHS  . zinc sulfate  220 mg Oral Daily   Continuous Infusions:  PRN Meds:.acetaminophen, chlorpheniramine-HYDROcodone, guaiFENesin-dextromethorphan  Antibiotics  :    Anti-infectives (From admission, onward)   Start     Dose/Rate Route Frequency Ordered Stop   02/10/19 1000  remdesivir 100 mg in sodium chloride 0.9 % 100 mL IVPB     100 mg 200 mL/hr over 30 Minutes Intravenous Daily  02/11/2019 2120 02/11/19 0905       Time Spent in minutes  30   Lala Lund M.D on 02/11/2019 at 9:51 AM  To page go to [email or web address] - password [facility]  [facility] -  Office  [phone]  See all Orders from today for further details    Objective:   Vitals:   02/11/19 0000 02/11/19 0010 02/11/19 0439 02/11/19 0754  BP: (!) 141/70  126/72 122/73  Pulse: (!) 101 87 100 (!) 104  Resp: (!) '22 15 20 '$ (!) 21  Temp: 98.4 F (36.9 C)  99.1 F (37.3 C) 98.8 F (37.1 C)  TempSrc: Oral  Oral Oral  SpO2: (!) 88% 91% 94% (!) 88%  Height:        Wt Readings from Last 3 Encounters:  02/08/2019 110.6 kg  02/04/19 117.9 kg  01/14/19 119.3 kg     Intake/Output Summary (Last 24 hours) at 02/11/2019  0951 Last data filed at 02/11/2019 0111 Gross per 24 hour  Intake 600 ml  Output 625 ml  Net -25 ml     Physical Exam  Awake Alert,  No new F.N deficits, Normal affect [place].AT,PERRAL Supple Neck,No JVD, No cervical lymphadenopathy appriciated.  Symmetrical Chest wall movement, Good air movement bilaterally, few rales RRR,No Gallops, Rubs or new Murmurs, No Parasternal Heave +ve B.Sounds, Abd Soft, No tenderness, No organomegaly appriciated, No rebound - guarding or rigidity. No Cyanosis, Clubbing or edema, No new Rash or bruise    Data Review:    CBC Recent Labs  Lab 02/07/19 0323 02/08/19 0657 01/31/2019 0420 02/10/19 0157 02/11/19 0407  WBC 5.0 6.5 7.9 6.7 12.7*  HGB 13.0 13.0 13.4 13.5 14.3  HCT 38.5* 40.3 39.5 42.0 45.1  PLT 124* 160 186 212 293  MCV 82.1 86.1 82.3 87.0 87.2  MCH 27.7 27.8 27.9 28.0 27.7  MCHC 33.8 32.3 33.9 32.1 31.7  RDW 13.4 13.3 13.6 13.3 13.4  LYMPHSABS 0.5* 1.0 0.9 0.6* 0.8  MONOABS 0.3 0.4 0.4 0.3 0.3  EOSABS 0.0 0.0 0.0 0.0 0.0  BASOSABS 0.0 0.0 0.0 0.0 0.0    Chemistries  Recent Labs  Lab 02/06/19 0628 02/07/19 0323 02/08/19 0657 01/22/2019 0420 02/10/19 0157 02/11/19 0407  NA 134* 137 141 142 139 141  K 3.7 3.8 3.7 4.0 3.9 3.7  CL 105 105 109 109 101 105  CO2 19* 22 21* '23 22 24  '$ GLUCOSE 110* 115* 113* 120* 110* 134*  BUN '17 14 16 20 22 '$ 29*  CREATININE 1.17 1.21 1.04 0.95 0.88 1.12  CALCIUM 7.9* 8.1* 8.3* 8.6* 8.2* 8.7*  MG 2.1 2.2 2.4 2.3  --  2.4  AST  --  59* 68* 66* 65* 61*  ALT  --  30 39 47* 58* 80*  ALKPHOS  --  38 37* 46 52 74  BILITOT  --  0.7 0.6 0.6 1.0 0.7   ------------------------------------------------------------------------------------------------------------------ No results for input(s): CHOL, HDL, LDLCALC, TRIG, CHOLHDL, LDLDIRECT in the last 72 hours.  Lab Results  Component Value Date   HGBA1C 5.8 (H) 01/14/2019    ------------------------------------------------------------------------------------------------------------------ No results for input(s): TSH, T4TOTAL, T3FREE, THYROIDAB in the last 72 hours.  Invalid input(s): FREET3  Cardiac Enzymes No results for input(s): CKMB, TROPONINI, MYOGLOBIN in the last 168 hours.  Invalid input(s): CK ------------------------------------------------------------------------------------------------------------------    Component Value Date/Time   BNP 51.9 02/11/2019 0407    Micro Results Recent Results (from the past 240 hour(s))  SARS Coronavirus 2 Ag (30 min TAT) - Nasal  Swab (BD Veritor Kit)     Status: None   Collection Time: 02/04/19  5:56 PM   Specimen: Nasal Swab (BD Veritor Kit)  Result Value Ref Range Status   SARS Coronavirus 2 Ag NEGATIVE NEGATIVE Final    Comment: (NOTE) SARS-CoV-2 antigen NOT DETECTED.  Negative results are presumptive.  Negative results do not preclude SARS-CoV-2 infection and should not be used as the sole basis for treatment or other patient management decisions, including infection  control decisions, particularly in the presence of clinical signs and  symptoms consistent with COVID-19, or in those who have been in contact with the virus.  Negative results must be combined with clinical observations, patient history, and epidemiological information. The expected result is Negative. Fact Sheet for Patients: PodPark.tn Fact Sheet for Healthcare Providers: GiftContent.is This test is not yet approved or cleared by the [place] FDA and  has been authorized for detection and/or diagnosis of SARS-CoV-2 by FDA under an Emergency Use Authorization (EUA).  This EUA will remain in effect (meaning this test can be used) for the duration of  the COVID-19 de claration under Section 564(b)(1) of the Act, 21 U.S.C. section 360bbb-3(b)(1), unless the authorization  is terminated or revoked sooner. Performed at [facility], [address] [place], [place] [address]   Blood culture (routine x 2)     Status: None   Collection Time: 02/04/19 10:41 PM   Specimen: BLOOD  Result Value Ref Range Status   Specimen Description BLOOD RIGHT ANTECUBITAL  Final   Special Requests   Final    BOTTLES DRAWN AEROBIC AND ANAEROBIC Blood Culture results may not be optimal due to an excessive volume of blood received in culture bottles   Culture   Final    NO GROWTH 5 DAYS Performed at [facility], [address] [place], [place] [address]    Report Status 01/29/2019 FINAL  Final  Blood culture (routine x 2)     Status: None   Collection Time: 02/04/19 11:12 PM   Specimen: BLOOD  Result Value Ref Range Status   Specimen Description BLOOD BLOOD RIGHT HAND  Final   Special Requests   Final    BOTTLES DRAWN AEROBIC AND ANAEROBIC Blood Culture adequate volume   Culture   Final    NO GROWTH 5 DAYS Performed at [facility], [address] [place], [place] [address]    Report Status 01/19/2019 FINAL  Final  CULTURE, BLOOD (ROUTINE X 2) w Reflex to ID Panel     Status: None   Collection Time: 02/06/19  8:36 AM   Specimen: BLOOD  Result Value Ref Range Status   Specimen Description BLOOD BLOOD LEFT HAND  Final   Special Requests   Final    BOTTLES DRAWN AEROBIC AND ANAEROBIC Blood Culture results may not be optimal due to an excessive volume of blood received in culture bottles   Culture   Final    NO GROWTH 5 DAYS Performed at [facility], [address] [place], [place] [address]    Report Status 02/11/2019 FINAL  Final  CULTURE, BLOOD (ROUTINE X 2) w Reflex to ID Panel     Status: None   Collection Time: 02/06/19  8:43 AM   Specimen: BLOOD  Result Value Ref Range Status   Specimen Description BLOOD BLOOD RIGHT HAND  Final   Special Requests   Final    BOTTLES DRAWN AEROBIC AND ANAEROBIC Blood Culture  results may not  be optimal due to an excessive volume of blood received in culture bottles   Culture   Final    NO GROWTH 5 DAYS Performed at [facility], [address] [place], [place] [address]    Report Status 02/11/2019 FINAL  Final  SARS CORONAVIRUS 2 (TAT 6-24 HRS) Nasopharyngeal Nasopharyngeal Swab     Status: Abnormal   Collection Time: 02/06/19  8:17 PM   Specimen: Nasopharyngeal Swab  Result Value Ref Range Status   SARS Coronavirus 2 POSITIVE (A) NEGATIVE Final    Comment: RESULT CALLED TO, READ BACK BY AND VERIFIED WITH: Gayland Curry RN 11:00 02/07/19 ([email or web address]) (NOTE) SARS-CoV-2 target nucleic acids are DETECTED. The SARS-CoV-2 RNA is generally detectable in upper and lower respiratory specimens during the acute phase of infection. Positive results are indicative of the presence of SARS-CoV-2 RNA. Clinical correlation with patient history and other diagnostic information is  necessary to determine patient infection status. Positive results do not rule out bacterial infection or co-infection with other viruses.  The expected result is Negative. Fact Sheet for Patients: SugarRoll.be Fact Sheet for Healthcare Providers: https://www.woods-mathews.com/ This test is not yet approved or cleared by the [place] FDA and  has been authorized for detection and/or diagnosis of SARS-CoV-2 by FDA under an Emergency Use Authorization (EUA). This EUA will remain  in effect (meaning this test can be used) for  the duration of the COVID-19 declaration under Section 564(b)(1) of the Act, 21 U.S.C. section 360bbb-3(b)(1), unless the authorization is terminated or revoked sooner. Performed at [facility], [place] [address] [place], [place] [address]     Radiology Reports DG Chest 2 View  Result Date: 02/04/2019 CLINICAL DATA:  73 year old male with fever and cough. EXAM: CHEST - 2 VIEW COMPARISON:  Chest radiograph dated  10/20/2017. FINDINGS: Right-sided Port-A-Cath in similar position. There is an area of density in the left upper lobe similar to prior radiograph, likely scarring. No consolidative changes. There is no pleural effusion or pneumothorax. Stable cardiac silhouette. No acute osseous pathology. IMPRESSION: 1. No acute cardiopulmonary process. 2. Stable left upper lobe scarring. Electronically Signed   By: Anner Crete M.D.   On: 02/04/2019 18:47   CT CHEST W CONTRAST  Result Date: 02/06/2019 CLINICAL DATA:  Squamous cell carcinoma along. Evaluate treatment response. Fever and cough. EXAM: CT CHEST, ABDOMEN, AND PELVIS WITH CONTRAST TECHNIQUE: Multidetector CT imaging of the chest, abdomen and pelvis was performed following the standard protocol during bolus administration of intravenous contrast. CONTRAST:  164m OMNIPAQUE IOHEXOL 300 MG/ML  SOLN COMPARISON:  CT 12/25/2018 FINDINGS: CT CHEST FINDINGS Cardiovascular: Port in the anterior chest wall with tip in distal SVC. Coronary artery calcification and aortic atherosclerotic calcification. Mediastinum/Nodes: No axillary supraclavicular adenopathy. No mediastinal hilar adenopathy. No pericardial effusion. Esophagus normal. Lungs/Pleura: Band of angular consolidation with air bronchograms in LEFT upper lobe unchanged from prior consistent with radiation treatment effect. There is increased ground-glass opacities superimposed on centrilobular emphysema in the RIGHT upper lobe and posterior aspect of the LEFT lower lobe. No suspicious nodularity. No focal consolidation. There is diffuse ground-glass opacities in the lower lobes additionally. Musculoskeletal: Low-density lesion in the RIGHT hepatic lobe consistent benign cysts. Normal gallbladder. CT ABDOMEN AND PELVIS FINDINGS Hepatobiliary: No focal hepatic lesion. No biliary ductal dilatation. Gallbladder is normal. Common bile duct is normal. Pancreas: Pancreas is normal. No ductal dilatation. No pancreatic  inflammation. Spleen: Normal spleen Adrenals/urinary tract: Adrenal glands normal. Low-density lesions in the kidneys likely represent benign  cysts. Ureters and bladder normal. Stomach/Bowel: Stomach, small bowel, appendix, and cecum are normal. The colon and rectosigmoid colon are normal. Vascular/Lymphatic: Abdominal aorta is normal caliber with atherosclerotic calcification. There is no retroperitoneal or periportal lymphadenopathy. No pelvic lymphadenopathy. Reproductive: Prostate normal Other: No peritoneal metastasis Musculoskeletal: No aggressive osseous lesion. IMPRESSION: Chest Impression: 1. New bilateral ground-glass opacities within LEFT and RIGHT lung are nonspecific with broad differential including pulmonary edema, drug reaction, atypical infection or inflammation. 2. Angular consolidation in the LEFT upper lobe consistent radiation treatment. 3. No evidence of lung cancer recurrence. Abdomen / Pelvis Impression: 1. No evidence of metastatic disease in the abdomen pelvis. 2. Benign appearing cysts of the kidneys and liver. 3.  Aortic Atherosclerosis (ICD10-I70.0). Electronically Signed   By: Suzy Bouchard M.D.   On: 02/06/2019 17:41   CT ABDOMEN PELVIS W CONTRAST  Result Date: 02/06/2019 CLINICAL DATA:  Squamous cell carcinoma along. Evaluate treatment response. Fever and cough. EXAM: CT CHEST, ABDOMEN, AND PELVIS WITH CONTRAST TECHNIQUE: Multidetector CT imaging of the chest, abdomen and pelvis was performed following the standard protocol during bolus administration of intravenous contrast. CONTRAST:  141m OMNIPAQUE IOHEXOL 300 MG/ML  SOLN COMPARISON:  CT 12/25/2018 FINDINGS: CT CHEST FINDINGS Cardiovascular: Port in the anterior chest wall with tip in distal SVC. Coronary artery calcification and aortic atherosclerotic calcification. Mediastinum/Nodes: No axillary supraclavicular adenopathy. No mediastinal hilar adenopathy. No pericardial effusion. Esophagus normal. Lungs/Pleura: Band of  angular consolidation with air bronchograms in LEFT upper lobe unchanged from prior consistent with radiation treatment effect. There is increased ground-glass opacities superimposed on centrilobular emphysema in the RIGHT upper lobe and posterior aspect of the LEFT lower lobe. No suspicious nodularity. No focal consolidation. There is diffuse ground-glass opacities in the lower lobes additionally. Musculoskeletal: Low-density lesion in the RIGHT hepatic lobe consistent benign cysts. Normal gallbladder. CT ABDOMEN AND PELVIS FINDINGS Hepatobiliary: No focal hepatic lesion. No biliary ductal dilatation. Gallbladder is normal. Common bile duct is normal. Pancreas: Pancreas is normal. No ductal dilatation. No pancreatic inflammation. Spleen: Normal spleen Adrenals/urinary tract: Adrenal glands normal. Low-density lesions in the kidneys likely represent benign cysts. Ureters and bladder normal. Stomach/Bowel: Stomach, small bowel, appendix, and cecum are normal. The colon and rectosigmoid colon are normal. Vascular/Lymphatic: Abdominal aorta is normal caliber with atherosclerotic calcification. There is no retroperitoneal or periportal lymphadenopathy. No pelvic lymphadenopathy. Reproductive: Prostate normal Other: No peritoneal metastasis Musculoskeletal: No aggressive osseous lesion. IMPRESSION: Chest Impression: 1. New bilateral ground-glass opacities within LEFT and RIGHT lung are nonspecific with broad differential including pulmonary edema, drug reaction, atypical infection or inflammation. 2. Angular consolidation in the LEFT upper lobe consistent radiation treatment. 3. No evidence of lung cancer recurrence. Abdomen / Pelvis Impression: 1. No evidence of metastatic disease in the abdomen pelvis. 2. Benign appearing cysts of the kidneys and liver. 3.  Aortic Atherosclerosis (ICD10-I70.0). Electronically Signed   By: SSuzy BouchardM.D.   On: 02/06/2019 17:41   Leg [facility]  Result Date: 02/10/2019  Lower  Venous Study Indications: Covid-19, elevated D-Dimer.  Comparison Study: No prior study on file for comparison Performing Technologist: CSharion DoveRVS  Examination Guidelines: A complete evaluation includes B-mode imaging, spectral Doppler, color Doppler, and power Doppler as needed of all accessible portions of each vessel. Bilateral testing is considered an integral part of a complete examination. Limited examinations for reoccurring indications may be performed as noted.  +---------+---------------+---------+-----------+----------+--------------+ RIGHT    CompressibilityPhasicitySpontaneityPropertiesThrombus Aging +---------+---------------+---------+-----------+----------+--------------+ CFV      Full  Yes      Yes                                 +---------+---------------+---------+-----------+----------+--------------+ SFJ      Full                                                        +---------+---------------+---------+-----------+----------+--------------+ FV Prox  Full                                                        +---------+---------------+---------+-----------+----------+--------------+ FV Mid   Full                                                        +---------+---------------+---------+-----------+----------+--------------+ FV DistalFull                                                        +---------+---------------+---------+-----------+----------+--------------+ PFV      Full                                                        +---------+---------------+---------+-----------+----------+--------------+ POP      Full           Yes      Yes                                 +---------+---------------+---------+-----------+----------+--------------+ PTV      Full                                                        +---------+---------------+---------+-----------+----------+--------------+ PERO     Full                                                         +---------+---------------+---------+-----------+----------+--------------+   +---------+---------------+---------+-----------+----------+--------------+ LEFT     CompressibilityPhasicitySpontaneityPropertiesThrombus Aging +---------+---------------+---------+-----------+----------+--------------+ CFV      Full           Yes      Yes                                 +---------+---------------+---------+-----------+----------+--------------+ SFJ  Full                                                        +---------+---------------+---------+-----------+----------+--------------+ FV Prox  Full                                                        +---------+---------------+---------+-----------+----------+--------------+ FV Mid   Full                                                        +---------+---------------+---------+-----------+----------+--------------+ FV DistalFull                                                        +---------+---------------+---------+-----------+----------+--------------+ PFV      Full                                                        +---------+---------------+---------+-----------+----------+--------------+ POP      Full           Yes      Yes                                 +---------+---------------+---------+-----------+----------+--------------+ PTV      Full                                                        +---------+---------------+---------+-----------+----------+--------------+ PERO     Full                                                        +---------+---------------+---------+-----------+----------+--------------+     Summary: Right: There is no evidence of deep vein thrombosis in the lower extremity. Left: There is no evidence of deep vein thrombosis in the lower extremity.  *See table(s) above for measurements and observations.  Electronically signed by Monica Martinez MD on 02/10/2019 at 1:47:34 PM.    Final

## 2019-02-12 LAB — COMPREHENSIVE METABOLIC PANEL
ALT: 90 U/L — ABNORMAL HIGH (ref 0–44)
AST: 54 U/L — ABNORMAL HIGH (ref 15–41)
Albumin: 3.6 g/dL (ref 3.5–5.0)
Alkaline Phosphatase: 81 U/L (ref 38–126)
Anion gap: 16 — ABNORMAL HIGH (ref 5–15)
BUN: 35 mg/dL — ABNORMAL HIGH (ref 8–23)
CO2: 24 mmol/L (ref 22–32)
Calcium: 8.8 mg/dL — ABNORMAL LOW (ref 8.9–10.3)
Chloride: 102 mmol/L (ref 98–111)
Creatinine, Ser: 1.08 mg/dL (ref 0.61–1.24)
GFR calc Af Amer: >60 mL/min (ref >60)
GFR calc non Af Amer: >60 mL/min (ref >60)
Glucose, Bld: 111 mg/dL — ABNORMAL HIGH (ref 70–99)
Potassium: 3.4 mmol/L — ABNORMAL LOW (ref 3.5–5.1)
Sodium: 142 mmol/L (ref 135–145)
Total Bilirubin: 1.1 mg/dL (ref 0.3–1.2)
Total Protein: 7.5 g/dL (ref 6.5–8.1)

## 2019-02-12 LAB — GLUCOSE, CAPILLARY
Glucose-Capillary: 134 mg/dL — ABNORMAL HIGH (ref 70–99)
Glucose-Capillary: 106 mg/dL — ABNORMAL HIGH (ref 70–99)
Glucose-Capillary: 119 mg/dL — ABNORMAL HIGH (ref 70–99)
Glucose-Capillary: 120 mg/dL — ABNORMAL HIGH (ref 70–99)

## 2019-02-12 LAB — CBC WITH DIFFERENTIAL/PLATELET
Abs Immature Granulocytes: 0.18 10*3/uL — ABNORMAL HIGH (ref 0.00–0.07)
Basophils Absolute: 0 10*3/uL (ref 0.0–0.1)
Basophils Relative: 0 %
Eosinophils Absolute: 0 10*3/uL (ref 0.0–0.5)
Eosinophils Relative: 0 %
HCT: 45.9 % (ref 39.0–52.0)
Hemoglobin: 14.8 g/dL (ref 13.0–17.0)
Immature Granulocytes: 1 %
Lymphocytes Relative: 6 %
Lymphs Abs: 0.9 10*3/uL (ref 0.7–4.0)
MCH: 28.5 pg (ref 26.0–34.0)
MCHC: 32.2 g/dL (ref 30.0–36.0)
MCV: 88.3 fL (ref 80.0–100.0)
Monocytes Absolute: 0.3 10*3/uL (ref 0.1–1.0)
Monocytes Relative: 2 %
Neutro Abs: 13.6 10*3/uL — ABNORMAL HIGH (ref 1.7–7.7)
Neutrophils Relative %: 91 %
Platelets: 346 10*3/uL (ref 150–400)
RBC: 5.2 MIL/uL (ref 4.22–5.81)
RDW: 13.5 % (ref 11.5–15.5)
WBC: 15.1 10*3/uL — ABNORMAL HIGH (ref 4.0–10.5)
nRBC: 0 % (ref 0.0–0.2)

## 2019-02-12 LAB — MAGNESIUM: Magnesium: 2.5 mg/dL — ABNORMAL HIGH (ref 1.7–2.4)

## 2019-02-12 LAB — D-DIMER, QUANTITATIVE: D-Dimer, Quant: 13.06 ug/mL-FEU — ABNORMAL HIGH (ref 0.00–0.50)

## 2019-02-12 LAB — C-REACTIVE PROTEIN: CRP: 0.6 mg/dL (ref <1.0)

## 2019-02-12 LAB — PROCALCITONIN: Procalcitonin: <0.1 ng/mL

## 2019-02-12 LAB — BRAIN NATRIURETIC PEPTIDE: B Natriuretic Peptide: 62.2 pg/mL (ref 0.0–100.0)

## 2019-02-12 MED ORDER — METHYLPREDNISOLONE SODIUM SUCC 40 MG IJ SOLR
40.0000 mg | Freq: Two times a day (BID) | INTRAMUSCULAR | Status: DC
  Administered 2019-02-12 – 2019-02-13 (×3): 40 mg via INTRAVENOUS
  Filled 2019-02-12 (×3): qty 1

## 2019-02-12 MED ORDER — POTASSIUM CHLORIDE CRYS ER 20 MEQ PO TBCR
40.0000 meq | EXTENDED_RELEASE_TABLET | Freq: Once | ORAL | Status: AC
  Administered 2019-02-12: 08:00:00 40 meq via ORAL
  Filled 2019-02-12: qty 2

## 2019-02-12 NOTE — TOC Initial Note (Signed)
Transition of Care [facility]) - Initial/Assessment Note    Patient Details  Name: [name] MRN: [id number] Date of Birth: Jul 29, 1945  Transition of Care [facility]) CM/SW Contact:    Ninfa Meeker, RN Phone Number: 02/12/2019, 10:59 AM  Clinical Narrative   Patient is a 73 yr old male who  presented to [place] with shortness of breath, was diagnosed with acute hypoxic respiratory failure due to COVID-19 pneumonia. Patient was transferred to [facility] for further treatment.On 15L HFNC, last dose of Remdesivir was 02/11/19. Case manager spoke with patient concerning discharge needs. Choice offered for [place], patient has no preference. Case Manager called referral to Adela Lank, Liaison with [facility]. Patient states he will have support when discharged home. Case manager will continue to follow for possible oxygen needs at discharge. Will need Face to Face and Home Health orders.                   Expected Discharge Plan: [place] Barriers to Discharge: Continued Medical Work up   Patient Goals and CMS Choice Patient states their goals for this hospitalization and ongoing recovery are:: get better   Choice offered to / list presented to : Patient  Expected Discharge Plan and Services Expected Discharge Plan: [place] Acute Care Choice: [place] arrangements for the past 2 months: [place] Arranged: PT, OT HH Agency: [place] Date [facility] Agency Contacted: 02/12/19 Time HH Agency Contacted: 41 Representative spoke with at [place]: Adela Lank  Prior Living Arrangements/Services Living arrangements for the past 2 months: [place] with:: Significant Other   Do you feel safe going back to the place where you live?: Yes               Activities of Daily Living Home Assistive Devices/Equipment: None ADL Screening  (condition at time of admission) Patient's cognitive ability adequate to safely complete daily activities?: Yes Is the patient deaf or have difficulty hearing?: No Does the patient have difficulty seeing, even when wearing glasses/contacts?: No Does the patient have difficulty concentrating, remembering, or making decisions?: No Patient able to express need for assistance with ADLs?: Yes Does the patient have difficulty dressing or bathing?: No Independently performs ADLs?: Yes (appropriate for developmental age) Does the patient have difficulty walking or climbing stairs?: No Weakness of Legs: None Weakness of Arms/Hands: None  Permission Sought/Granted Permission sought to share information with : Case Manager                Emotional Assessment              Admission diagnosis:  Acute hypoxemic respiratory failure due to severe acute respiratory syndrome coronavirus 2 (SARS-CoV-2) disease ([place]) [U07.1, J96.01] Patient Active Problem List   Diagnosis Date Noted  . Acute hypoxemic respiratory failure due to severe acute respiratory syndrome coronavirus 2 (SARS-CoV-2) disease ([place]) 02/14/2019  . Acute respiratory failure with hypoxia ([place]) 02/07/2019  . Pneumonia due to COVID-19 virus 02/07/2019  . Fever 02/07/2019  . Asthma 02/05/2019  . GERD (gastroesophageal reflux disease) 02/04/2019  . Atherosclerosis of aorta ([place]) 06/24/2018  . Benign essential HTN 03/29/2018  . Coronary artery disease of native artery of native heart with stable angina  pectoris ([place]) 03/27/2018  . Mixed hyperlipidemia 10/06/2017  . Allergic rhinitis 10/06/2017  . Encounter for antineoplastic immunotherapy 02/10/2017  . Prediabetes 01/25/2017  . BPH (benign prostatic hyperplasia) 10/21/2016  . Squamous cell lung cancer, left ([place]) 09/07/2016  . B12 deficiency 09/07/2016  . Vitamin D deficiency 09/07/2016  . Obesity (BMI 30-39.9) 09/07/2016  . Colon polyps 09/07/2016   PCP:  Glean Hess,  MD Pharmacy:   [facility] [address] ([address]), [place] [address] [place]) [place] [address] Phone: [phone] Fax: [phone]     Social Determinants of Health (SDOH) Interventions    Readmission Risk Interventions No flowsheet data found.

## 2019-02-12 NOTE — Progress Notes (Signed)
Staff rounding on pt and he is sleeping without O2 on. Current  O2  sats without supplemental oxygen is 98%. Will keep  Pt on RA for now to access how he manages. Will more than likely need it when up and moving.

## 2019-02-12 NOTE — Progress Notes (Signed)
1600- Updates given to Significant Other. Time allowed for questions.

## 2019-02-12 NOTE — Progress Notes (Signed)
PROGRESS NOTE                                                                                                                                                                                                             Patient Demographics:    [name], is a 73 y.o. male, DOB - 07/16/1945, CBS:[id number]  Outpatient Primary MD for the patient is Glean Hess, MD    LOS - 3  Admit date - 01/23/2019    CC - SOB     Brief Narrative  [name]  is a 73 y.o. male,73 y.o.malewith medical history significant ofsquamous cell carcinoma of the lung status post treatment since 2018, BPH, prediabetic, morbid obesity, vitamin D deficiency, vitamin B-12 deficiency, HTN, GERD, CAD who presented to [place] with shortness of breath was diagnosed with acute hypoxic respiratory failure due to COVID-19 pneumonia.  Was appropriately treated with IV steroids, remdesivir and Actemra thereafter transferred to [facility] hospital on 15 L high flow nasal cannula oxygen.      Subjective:   Patient in recliner, appears comfortable, denies any headache, no fever, no chest pain or pressure, much improved shortness of breath , no abdominal pain. No focal weakness.     Assessment  & Plan :     1. Acute Hypoxic Resp. Failure due to Acute Covid 19 Viral Pneumonitis during the ongoing 2020 Covid 19 Pandemic - he had severe disease, was treated appropriately at [facility] with IV steroids, remdesivir and Actemra was initially on 15 L high flow nasal cannula oxygen.  Clinically he has improved and now down to 3 L nasal cannula oxygen, continue steroid taper along with IV remdesivir course.  Encouraged the patient to sit up in chair in the daytime use I-S and flutter valve for pulmonary toiletry and then prone in bed when at night.  SpO2: 98 % O2 Flow Rate (L/min): 3 L/min  Recent Labs  Lab 02/06/19 0628 02/06/19 2017  02/07/19 0323 02/08/19 0657 02/08/2019 0420 02/10/19 0157 02/11/19 0407 02/12/19 0328  CRP  --   --  4.4* 4.0* 2.2* 1.9* 0.9 0.6  DDIMER  --   --   --   --   --  >20.00* >20.00* 13.06*  FERRITIN  --   --  1,138* 1,332* 1,374* 925*  --   --  BNP  --   --   --   --   --  57.8 51.9 62.2  PROCALCITON 0.10  --   --   --   --  <0.10 <0.10 <0.10  SARSCOV2NAA  --  POSITIVE*  --   --   --   --   --   --     Hepatic Function Latest Ref Rng & Units 02/12/2019 02/11/2019 02/10/2019  Total Protein 6.5 - 8.1 g/dL 7.5 7.7 7.5  Albumin 3.5 - 5.0 g/dL 3.6 3.5 3.3(L)  AST 15 - 41 U/L 54(H) 61(H) 65(H)  ALT 0 - 44 U/L 90(H) 80(H) 58(H)  Alk Phosphatase 38 - 126 U/L 81 74 52  Total Bilirubin 0.3 - 1.2 mg/dL 1.1 0.7 1.0    2.  History of squamous cell cancer in the left lung.  Resume outpatient follow-up and treatment with primary oncologist and PCP.  3.  Obesity.  BMI 34.  Follow with PCP.  4.  GERD.  PPI.  5.  Underlying asthma.  Stable.  Supportive care.  6.  Essential hypertension.  placed on low-dose beta-blocker will monitor.  7. D-dimer is at the max level despite full dose Lovenox and lower extremity DVT negative.  CT chest was negative, cannot          rule out pelvic bed DVT.  D-dimer has now started to taper down, will switch to high-dose prophylactic Lovenox on       02/13/2019.  8.  Hypokalemia.  Replaced.  9.  Mild asymptomatic COVID-19 transaminitis.  Trend stable, symptom-free.  Monitor.      Condition - Extremely Guarded  Family Communication  : Message left for significant other on 02/10/2019 at 9:20 AM.  Code Status : Full  Diet :    Diet Order            Diet regular Room service appropriate? Yes; Fluid consistency: Thin  Diet effective now               Disposition Plan  : TBD  Consults  :  None  Procedures  :    Leg ultrasound venous.  Negative.    CT - 1. New bilateral ground-glass opacities within LEFT and RIGHT lung are nonspecific with broad  differential including pulmonary edema, drug reaction, atypical infection or inflammation. 2. Angular consolidation in the LEFT upper lobe consistent radiation treatment. 3. No evidence of lung cancer recurrence. Abdomen / Pelvis Impression: 1. No evidence of metastatic disease in the abdomen pelvis. 2. Benign appearing cysts of the kidneys and liver. 3.  Aortic Atherosclerosis  PUD Prophylaxis : PPI  DVT Prophylaxis  :  Lovenox    Lab Results  Component Value Date   PLT 346 02/12/2019    Inpatient Medications  Scheduled Meds: . vitamin C  500 mg Oral Daily  . aspirin EC  81 mg Oral Daily  . atorvastatin  10 mg Oral Daily  . docusate sodium  100 mg Oral Daily  . enoxaparin (LOVENOX) injection  1 mg/kg Subcutaneous Q12H  . fluticasone  2 puff Inhalation BID  . Ipratropium-Albuterol  1 puff Inhalation Q6H  . methylPREDNISolone (SOLU-MEDROL) injection  40 mg Intravenous Q12H  . metoprolol tartrate  50 mg Oral BID  . pantoprazole  40 mg Oral Daily  . terazosin  1 mg Oral QHS  . zinc sulfate  220 mg Oral Daily   Continuous Infusions:  PRN Meds:.acetaminophen, chlorpheniramine-HYDROcodone, guaiFENesin-dextromethorphan  Antibiotics  :  Anti-infectives (From admission, onward)   Start     Dose/Rate Route Frequency Ordered Stop   02/10/19 1000  remdesivir 100 mg in sodium chloride 0.9 % 100 mL IVPB     100 mg 200 mL/hr over 30 Minutes Intravenous Daily 02/02/2019 2120 02/11/19 0920       Time Spent in minutes  30   Lala Lund M.D on 02/12/2019 at 10:14 AM  To page go to [email or web address] - password [facility]  [facility] -  Office  [phone]  See all Orders from today for further details    Objective:   Vitals:   02/12/19 0705 02/12/19 0731 02/12/19 0800 02/12/19 0900  BP:  (!) 104/56    Pulse:  99    Resp: _0 Temp:  98.2 F (36.8 C)    TempSrc:  Oral    SpO2: 95% 98%    Height:        Wt Readings from Last 3 Encounters:  01/23/2019 110.6 kg   02/04/19 117.9 kg  01/14/19 119.3 kg     Intake/Output Summary (Last 24 hours) at 02/12/2019 1014 Last data filed at 02/12/2019 0900 Gross per 24 hour  Intake 360 ml  Output 950 ml  Net -590 ml     Physical Exam  Awake Alert,   No new F.N deficits, Normal affect [place].AT,PERRAL Supple Neck,No JVD, No cervical lymphadenopathy appriciated.  Symmetrical Chest wall movement, Good air movement bilaterally, CTAB RRR,No Gallops, Rubs or new Murmurs, No Parasternal Heave +ve B.Sounds, Abd Soft, No tenderness, No organomegaly appriciated, No rebound - guarding or rigidity. No Cyanosis, Clubbing or edema, No new Rash or bruise    Data Review:    CBC Recent Labs  Lab 02/08/19 0657 02/10/2019 0420 02/10/19 0157 02/11/19 0407 02/12/19 0328  WBC 6.5 7.9 6.7 12.7* 15.1*  HGB 13.0 13.4 13.5 14.3 14.8  HCT 40.3 39.5 42.0 45.1 45.9  PLT 160 186 212 293 346  MCV 86.1 82.3 87.0 87.2 88.3  MCH 27.8 27.9 28.0 27.7 28.5  MCHC 32.3 33.9 32.1 31.7 32.2  RDW 13.3 13.6 13.3 13.4 13.5  LYMPHSABS 1.0 0.9 0.6* 0.8 0.9  MONOABS 0.4 0.4 0.3 0.3 0.3  EOSABS 0.0 0.0 0.0 0.0 0.0  BASOSABS 0.0 0.0 0.0 0.0 0.0    Chemistries  Recent Labs  Lab 02/07/19 0323 02/08/19 0657 02/14/2019 0420 02/10/19 0157 02/11/19 0407 02/12/19 0328  NA 137 141 142 139 141 142  K 3.8 3.7 4.0 3.9 3.7 3.4*  CL 105 109 109 101 105 102  CO2 22 21* _1 GLUCOSE 115* 113* 120* 110* 134* 111*  BUN _2 29* 35*  CREATININE 1.21 1.04 0.95 0.88 1.12 1.08  CALCIUM 8.1* 8.3* 8.6* 8.2* 8.7* 8.8*  MG 2.2 2.4 2.3  --  2.4 2.5*  AST 59* 68* 66* 65* 61* 54*  ALT 30 39 47* 58* 80* 90*  ALKPHOS 38 37* 46 52 74 81  BILITOT 0.7 0.6 0.6 1.0 0.7 1.1   ------------------------------------------------------------------------------------------------------------------ No results for input(s): CHOL, HDL, LDLCALC, TRIG, CHOLHDL, LDLDIRECT in the last 72 hours.  Lab Results  Component Value Date   HGBA1C 5.8 (H)  01/14/2019   ------------------------------------------------------------------------------------------------------------------ No results for input(s): TSH, T4TOTAL, T3FREE, THYROIDAB in the last 72 hours.  Invalid input(s): FREET3  Cardiac Enzymes No results for input(s): CKMB, TROPONINI, MYOGLOBIN in the last 168 hours.  Invalid input(s): CK ------------------------------------------------------------------------------------------------------------------    Component Value Date/Time   BNP  62.2 02/12/2019 0328    Micro Results Recent Results (from the past 240 hour(s))  SARS Coronavirus 2 Ag (30 min TAT) - Nasal Swab (BD Veritor Kit)     Status: None   Collection Time: 02/04/19  5:56 PM   Specimen: Nasal Swab (BD Veritor Kit)  Result Value Ref Range Status   SARS Coronavirus 2 Ag NEGATIVE NEGATIVE Final    Comment: (NOTE) SARS-CoV-2 antigen NOT DETECTED.  Negative results are presumptive.  Negative results do not preclude SARS-CoV-2 infection and should not be used as the sole basis for treatment or other patient management decisions, including infection  control decisions, particularly in the presence of clinical signs and  symptoms consistent with COVID-19, or in those who have been in contact with the virus.  Negative results must be combined with clinical observations, patient history, and epidemiological information. The expected result is Negative. Fact Sheet for Patients: PodPark.tn Fact Sheet for Healthcare Providers: GiftContent.is This test is not yet approved or cleared by the [place] FDA and  has been authorized for detection and/or diagnosis of SARS-CoV-2 by FDA under an Emergency Use Authorization (EUA).  This EUA will remain in effect (meaning this test can be used) for the duration of  the COVID-19 de claration under Section 564(b)(1) of the Act, 21 U.S.C. section 360bbb-3(b)(1), unless the  authorization is terminated or revoked sooner. Performed at [facility], [address] [place], [place] [address]   Blood culture (routine x 2)     Status: None   Collection Time: 02/04/19 10:41 PM   Specimen: BLOOD  Result Value Ref Range Status   Specimen Description BLOOD RIGHT ANTECUBITAL  Final   Special Requests   Final    BOTTLES DRAWN AEROBIC AND ANAEROBIC Blood Culture results may not be optimal due to an excessive volume of blood received in culture bottles   Culture   Final    NO GROWTH 5 DAYS Performed at [facility], [address] [place], [place] [address]    Report Status 01/22/2019 FINAL  Final  Blood culture (routine x 2)     Status: None   Collection Time: 02/04/19 11:12 PM   Specimen: BLOOD  Result Value Ref Range Status   Specimen Description BLOOD BLOOD RIGHT HAND  Final   Special Requests   Final    BOTTLES DRAWN AEROBIC AND ANAEROBIC Blood Culture adequate volume   Culture   Final    NO GROWTH 5 DAYS Performed at [facility], [address] [place],  [address]    Report Status 01/26/2019 FINAL  Final  CULTURE, BLOOD (ROUTINE X 2) w Reflex to ID Panel     Status: None   Collection Time: 02/06/19  8:36 AM   Specimen: BLOOD  Result Value Ref Range Status   Specimen Description BLOOD BLOOD LEFT HAND  Final   Special Requests   Final    BOTTLES DRAWN AEROBIC AND ANAEROBIC Blood Culture results may not be optimal due to an excessive volume of blood received in culture bottles   Culture   Final    NO GROWTH 5 DAYS Performed at [facility], [address] [place],  [address]    Report Status 02/11/2019 FINAL  Final  CULTURE, BLOOD (ROUTINE X 2) w Reflex to ID Panel     Status: None   Collection Time: 02/06/19  8:43 AM   Specimen: BLOOD  Result Value Ref Range Status   Specimen Description BLOOD BLOOD  RIGHT HAND  Final   Special Requests   Final    BOTTLES DRAWN AEROBIC AND ANAEROBIC Blood  Culture results may not be optimal due to an excessive volume of blood received in culture bottles   Culture   Final    NO GROWTH 5 DAYS Performed at [facility], [address] [place], [place] [address]    Report Status 02/11/2019 FINAL  Final  SARS CORONAVIRUS 2 (TAT 6-24 HRS) Nasopharyngeal Nasopharyngeal Swab     Status: Abnormal   Collection Time: 02/06/19  8:17 PM   Specimen: Nasopharyngeal Swab  Result Value Ref Range Status   SARS Coronavirus 2 POSITIVE (A) NEGATIVE Final    Comment: RESULT CALLED TO, READ BACK BY AND VERIFIED WITH: Gayland Curry RN 11:00 02/07/19 ([email or web address]) (NOTE) SARS-CoV-2 target nucleic acids are DETECTED. The SARS-CoV-2 RNA is generally detectable in upper and lower respiratory specimens during the acute phase of infection. Positive results are indicative of the presence of SARS-CoV-2 RNA. Clinical correlation with patient history and other diagnostic information is  necessary to determine patient infection status. Positive results do not rule out bacterial infection or co-infection with other viruses.  The expected result is Negative. Fact Sheet for Patients: SugarRoll.be Fact Sheet for Healthcare Providers: https://www.woods-mathews.com/ This test is not yet approved or cleared by the [place] FDA and  has been authorized for detection and/or diagnosis of SARS-CoV-2 by FDA under an Emergency Use Authorization (EUA). This EUA will remain  in effect (meaning this test can be used) for  the duration of the COVID-19 declaration under Section 564(b)(1) of the Act, 21 U.S.C. section 360bbb-3(b)(1), unless the authorization is terminated or revoked sooner. Performed at [facility], [place] [address], [place], [place] [address]     Radiology Reports DG Chest 2 View  Result Date: 02/04/2019 CLINICAL DATA:  73 year old male with fever and cough. EXAM: CHEST - 2 VIEW COMPARISON:  Chest radiograph  dated 10/20/2017. FINDINGS: Right-sided Port-A-Cath in similar position. There is an area of density in the left upper lobe similar to prior radiograph, likely scarring. No consolidative changes. There is no pleural effusion or pneumothorax. Stable cardiac silhouette. No acute osseous pathology. IMPRESSION: 1. No acute cardiopulmonary process. 2. Stable left upper lobe scarring. Electronically Signed   By: Anner Crete M.D.   On: 02/04/2019 18:47   CT CHEST W CONTRAST  Result Date: 02/06/2019 CLINICAL DATA:  Squamous cell carcinoma along. Evaluate treatment response. Fever and cough. EXAM: CT CHEST, ABDOMEN, AND PELVIS WITH CONTRAST TECHNIQUE: Multidetector CT imaging of the chest, abdomen and pelvis was performed following the standard protocol during bolus administration of intravenous contrast. CONTRAST:  130m OMNIPAQUE IOHEXOL 300 MG/ML  SOLN COMPARISON:  CT 12/25/2018 FINDINGS: CT CHEST FINDINGS Cardiovascular: Port in the anterior chest wall with tip in distal SVC. Coronary artery calcification and aortic atherosclerotic calcification. Mediastinum/Nodes: No axillary supraclavicular adenopathy. No mediastinal hilar adenopathy. No pericardial effusion. Esophagus normal. Lungs/Pleura: Band of angular consolidation with air bronchograms in LEFT upper lobe unchanged from prior consistent with radiation treatment effect. There is increased ground-glass opacities superimposed on centrilobular emphysema in the RIGHT upper lobe and posterior aspect of the LEFT lower lobe. No suspicious nodularity. No focal consolidation. There is diffuse ground-glass opacities in the lower lobes additionally. Musculoskeletal: Low-density lesion in the RIGHT hepatic lobe consistent benign cysts. Normal gallbladder. CT ABDOMEN AND PELVIS FINDINGS Hepatobiliary: No focal hepatic lesion. No biliary ductal dilatation. Gallbladder is normal. Common bile duct is normal. Pancreas: Pancreas  is normal. No ductal dilatation. No  pancreatic inflammation. Spleen: Normal spleen Adrenals/urinary tract: Adrenal glands normal. Low-density lesions in the kidneys likely represent benign cysts. Ureters and bladder normal. Stomach/Bowel: Stomach, small bowel, appendix, and cecum are normal. The colon and rectosigmoid colon are normal. Vascular/Lymphatic: Abdominal aorta is normal caliber with atherosclerotic calcification. There is no retroperitoneal or periportal lymphadenopathy. No pelvic lymphadenopathy. Reproductive: Prostate normal Other: No peritoneal metastasis Musculoskeletal: No aggressive osseous lesion. IMPRESSION: Chest Impression: 1. New bilateral ground-glass opacities within LEFT and RIGHT lung are nonspecific with broad differential including pulmonary edema, drug reaction, atypical infection or inflammation. 2. Angular consolidation in the LEFT upper lobe consistent radiation treatment. 3. No evidence of lung cancer recurrence. Abdomen / Pelvis Impression: 1. No evidence of metastatic disease in the abdomen pelvis. 2. Benign appearing cysts of the kidneys and liver. 3.  Aortic Atherosclerosis (ICD10-I70.0). Electronically Signed   By: Suzy Bouchard M.D.   On: 02/06/2019 17:41   CT ABDOMEN PELVIS W CONTRAST  Result Date: 02/06/2019 CLINICAL DATA:  Squamous cell carcinoma along. Evaluate treatment response. Fever and cough. EXAM: CT CHEST, ABDOMEN, AND PELVIS WITH CONTRAST TECHNIQUE: Multidetector CT imaging of the chest, abdomen and pelvis was performed following the standard protocol during bolus administration of intravenous contrast. CONTRAST:  157m OMNIPAQUE IOHEXOL 300 MG/ML  SOLN COMPARISON:  CT 12/25/2018 FINDINGS: CT CHEST FINDINGS Cardiovascular: Port in the anterior chest wall with tip in distal SVC. Coronary artery calcification and aortic atherosclerotic calcification. Mediastinum/Nodes: No axillary supraclavicular adenopathy. No mediastinal hilar adenopathy. No pericardial effusion. Esophagus normal.  Lungs/Pleura: Band of angular consolidation with air bronchograms in LEFT upper lobe unchanged from prior consistent with radiation treatment effect. There is increased ground-glass opacities superimposed on centrilobular emphysema in the RIGHT upper lobe and posterior aspect of the LEFT lower lobe. No suspicious nodularity. No focal consolidation. There is diffuse ground-glass opacities in the lower lobes additionally. Musculoskeletal: Low-density lesion in the RIGHT hepatic lobe consistent benign cysts. Normal gallbladder. CT ABDOMEN AND PELVIS FINDINGS Hepatobiliary: No focal hepatic lesion. No biliary ductal dilatation. Gallbladder is normal. Common bile duct is normal. Pancreas: Pancreas is normal. No ductal dilatation. No pancreatic inflammation. Spleen: Normal spleen Adrenals/urinary tract: Adrenal glands normal. Low-density lesions in the kidneys likely represent benign cysts. Ureters and bladder normal. Stomach/Bowel: Stomach, small bowel, appendix, and cecum are normal. The colon and rectosigmoid colon are normal. Vascular/Lymphatic: Abdominal aorta is normal caliber with atherosclerotic calcification. There is no retroperitoneal or periportal lymphadenopathy. No pelvic lymphadenopathy. Reproductive: Prostate normal Other: No peritoneal metastasis Musculoskeletal: No aggressive osseous lesion. IMPRESSION: Chest Impression: 1. New bilateral ground-glass opacities within LEFT and RIGHT lung are nonspecific with broad differential including pulmonary edema, drug reaction, atypical infection or inflammation. 2. Angular consolidation in the LEFT upper lobe consistent radiation treatment. 3. No evidence of lung cancer recurrence. Abdomen / Pelvis Impression: 1. No evidence of metastatic disease in the abdomen pelvis. 2. Benign appearing cysts of the kidneys and liver. 3.  Aortic Atherosclerosis (ICD10-I70.0). Electronically Signed   By: SSuzy BouchardM.D.   On: 02/06/2019 17:41   Leg [facility]  Result Date:  02/10/2019  Lower Venous Study Indications: Covid-19, elevated D-Dimer.  Comparison Study: No prior study on file for comparison Performing Technologist: CSharion DoveRVS  Examination Guidelines: A complete evaluation includes B-mode imaging, spectral Doppler, color Doppler, and power Doppler as needed of all accessible portions of each vessel. Bilateral testing is considered an integral part of a complete examination. Limited examinations for reoccurring  indications may be performed as noted.  +---------+---------------+---------+-----------+----------+--------------+ RIGHT    CompressibilityPhasicitySpontaneityPropertiesThrombus Aging +---------+---------------+---------+-----------+----------+--------------+ CFV      Full           Yes      Yes                                 +---------+---------------+---------+-----------+----------+--------------+ SFJ      Full                                                        +---------+---------------+---------+-----------+----------+--------------+ FV Prox  Full                                                        +---------+---------------+---------+-----------+----------+--------------+ FV Mid   Full                                                        +---------+---------------+---------+-----------+----------+--------------+ FV DistalFull                                                        +---------+---------------+---------+-----------+----------+--------------+ PFV      Full                                                        +---------+---------------+---------+-----------+----------+--------------+ POP      Full           Yes      Yes                                 +---------+---------------+---------+-----------+----------+--------------+ PTV      Full                                                        +---------+---------------+---------+-----------+----------+--------------+  PERO     Full                                                        +---------+---------------+---------+-----------+----------+--------------+   +---------+---------------+---------+-----------+----------+--------------+ LEFT     CompressibilityPhasicitySpontaneityPropertiesThrombus Aging +---------+---------------+---------+-----------+----------+--------------+ CFV      Full           Yes      Yes                                 +---------+---------------+---------+-----------+----------+--------------+  SFJ      Full                                                        +---------+---------------+---------+-----------+----------+--------------+ FV Prox  Full                                                        +---------+---------------+---------+-----------+----------+--------------+ FV Mid   Full                                                        +---------+---------------+---------+-----------+----------+--------------+ FV DistalFull                                                        +---------+---------------+---------+-----------+----------+--------------+ PFV      Full                                                        +---------+---------------+---------+-----------+----------+--------------+ POP      Full           Yes      Yes                                 +---------+---------------+---------+-----------+----------+--------------+ PTV      Full                                                        +---------+---------------+---------+-----------+----------+--------------+ PERO     Full                                                        +---------+---------------+---------+-----------+----------+--------------+     Summary: Right: There is no evidence of deep vein thrombosis in the lower extremity. Left: There is no evidence of deep vein thrombosis in the lower extremity.  *See table(s) above for  measurements and observations. Electronically signed by Monica Martinez MD on 02/10/2019 at 1:47:34 PM.    Final

## 2019-02-12 NOTE — Progress Notes (Signed)
Pt got up from bed to chair to use urinal and O2  sats dropped to 75%. 15L HFNC in place, pt sitting on side of bed taking deep breaths and O2 sats 89-91%.

## 2019-02-12 NOTE — Progress Notes (Signed)
Pt desats easily with little movement. Was on 6L sating 90-91%; sat up in bed to get comfortable and desated to 65%. O2 increased to 15L until O2 sats increase and pt catches his breath

## 2019-02-13 ENCOUNTER — Inpatient Hospital Stay (HOSPITAL_COMMUNITY): Payer: Medicare HMO

## 2019-02-13 DIAGNOSIS — J189 Pneumonia, unspecified organism: Secondary | ICD-10-CM

## 2019-02-13 DIAGNOSIS — J982 Interstitial emphysema: Secondary | ICD-10-CM

## 2019-02-13 LAB — COMPREHENSIVE METABOLIC PANEL
ALT: 89 U/L — ABNORMAL HIGH (ref 0–44)
AST: 47 U/L — ABNORMAL HIGH (ref 15–41)
Albumin: 3.4 g/dL — ABNORMAL LOW (ref 3.5–5.0)
Alkaline Phosphatase: 82 U/L (ref 38–126)
Anion gap: 11 (ref 5–15)
BUN: 37 mg/dL — ABNORMAL HIGH (ref 8–23)
CO2: 24 mmol/L (ref 22–32)
Calcium: 8.6 mg/dL — ABNORMAL LOW (ref 8.9–10.3)
Chloride: 105 mmol/L (ref 98–111)
Creatinine, Ser: 1.08 mg/dL (ref 0.61–1.24)
GFR calc Af Amer: >60 mL/min (ref >60)
GFR calc non Af Amer: >60 mL/min (ref >60)
Glucose, Bld: 124 mg/dL — ABNORMAL HIGH (ref 70–99)
Potassium: 4.7 mmol/L (ref 3.5–5.1)
Sodium: 140 mmol/L (ref 135–145)
Total Bilirubin: 1.3 mg/dL — ABNORMAL HIGH (ref 0.3–1.2)
Total Protein: 7.4 g/dL (ref 6.5–8.1)

## 2019-02-13 LAB — BRAIN NATRIURETIC PEPTIDE: B Natriuretic Peptide: 43 pg/mL (ref 0.0–100.0)

## 2019-02-13 LAB — CBC WITH DIFFERENTIAL/PLATELET
Abs Immature Granulocytes: 0.25 10*3/uL — ABNORMAL HIGH (ref 0.00–0.07)
Basophils Absolute: 0 10*3/uL (ref 0.0–0.1)
Basophils Relative: 0 %
Eosinophils Absolute: 0 10*3/uL (ref 0.0–0.5)
Eosinophils Relative: 0 %
HCT: 45 % (ref 39.0–52.0)
Hemoglobin: 14.4 g/dL (ref 13.0–17.0)
Immature Granulocytes: 2 %
Lymphocytes Relative: 6 %
Lymphs Abs: 0.7 10*3/uL (ref 0.7–4.0)
MCH: 28 pg (ref 26.0–34.0)
MCHC: 32 g/dL (ref 30.0–36.0)
MCV: 87.4 fL (ref 80.0–100.0)
Monocytes Absolute: 0.2 10*3/uL (ref 0.1–1.0)
Monocytes Relative: 2 %
Neutro Abs: 10.4 10*3/uL — ABNORMAL HIGH (ref 1.7–7.7)
Neutrophils Relative %: 90 %
Platelets: 322 10*3/uL (ref 150–400)
RBC: 5.15 MIL/uL (ref 4.22–5.81)
RDW: 13.3 % (ref 11.5–15.5)
WBC: 11.6 10*3/uL — ABNORMAL HIGH (ref 4.0–10.5)
nRBC: 0 % (ref 0.0–0.2)

## 2019-02-13 LAB — HIV ANTIBODY (ROUTINE TESTING W REFLEX): HIV Screen 4th Generation wRfx: NONREACTIVE

## 2019-02-13 LAB — MAGNESIUM: Magnesium: 2.5 mg/dL — ABNORMAL HIGH (ref 1.7–2.4)

## 2019-02-13 LAB — GLUCOSE, CAPILLARY
Glucose-Capillary: 108 mg/dL — ABNORMAL HIGH (ref 70–99)
Glucose-Capillary: 118 mg/dL — ABNORMAL HIGH (ref 70–99)
Glucose-Capillary: 156 mg/dL — ABNORMAL HIGH (ref 70–99)

## 2019-02-13 LAB — D-DIMER, QUANTITATIVE: D-Dimer, Quant: 3.97 ug/mL-FEU — ABNORMAL HIGH (ref 0.00–0.50)

## 2019-02-13 LAB — C-REACTIVE PROTEIN: CRP: 0.6 mg/dL (ref <1.0)

## 2019-02-13 LAB — PROCALCITONIN: Procalcitonin: <0.1 ng/mL

## 2019-02-13 MED ORDER — IOHEXOL 350 MG/ML SOLN
80.0000 mL | Freq: Once | INTRAVENOUS | Status: AC | PRN
  Administered 2019-02-13: 80 mL via INTRAVENOUS

## 2019-02-13 MED ORDER — SODIUM CHLORIDE 0.9 % IV SOLN
500.0000 mg | INTRAVENOUS | Status: AC
  Administered 2019-02-13 – 2019-02-17 (×5): 500 mg via INTRAVENOUS
  Filled 2019-02-13 (×5): qty 500

## 2019-02-13 MED ORDER — VANCOMYCIN HCL 2000 MG/400ML IV SOLN
2000.0000 mg | Freq: Once | INTRAVENOUS | Status: AC
  Administered 2019-02-13: 2000 mg via INTRAVENOUS
  Filled 2019-02-13: qty 400

## 2019-02-13 MED ORDER — SODIUM CHLORIDE 0.9 % IV SOLN
2.0000 g | INTRAVENOUS | Status: AC
  Administered 2019-02-13 – 2019-02-17 (×5): 2 g via INTRAVENOUS
  Filled 2019-02-13 (×5): qty 20

## 2019-02-13 MED ORDER — VANCOMYCIN HCL 1750 MG/350ML IV SOLN
1750.0000 mg | INTRAVENOUS | Status: DC
  Administered 2019-02-14: 1750 mg via INTRAVENOUS
  Filled 2019-02-13 (×2): qty 350

## 2019-02-13 MED ORDER — ENOXAPARIN SODIUM 60 MG/0.6ML ~~LOC~~ SOLN
0.5000 mg/kg | SUBCUTANEOUS | Status: DC
  Administered 2019-02-13: 55 mg via SUBCUTANEOUS
  Filled 2019-02-13: qty 0.6

## 2019-02-13 MED ORDER — METOPROLOL TARTRATE 5 MG/5ML IV SOLN
5.0000 mg | Freq: Four times a day (QID) | INTRAVENOUS | Status: DC | PRN
  Administered 2019-02-13: 5 mg via INTRAVENOUS
  Filled 2019-02-13 (×3): qty 5

## 2019-02-13 MED ORDER — DEXAMETHASONE 4 MG PO TABS
4.0000 mg | ORAL_TABLET | Freq: Every day | ORAL | Status: AC
  Administered 2019-02-13 – 2019-02-15 (×3): 4 mg via ORAL
  Filled 2019-02-13 (×3): qty 1

## 2019-02-13 MED ORDER — HYDROCOD POLST-CPM POLST ER 10-8 MG/5ML PO SUER
5.0000 mL | Freq: Two times a day (BID) | ORAL | Status: DC
  Administered 2019-02-14 – 2019-02-19 (×12): 5 mL via ORAL
  Filled 2019-02-13 (×13): qty 5

## 2019-02-13 NOTE — Progress Notes (Signed)
1621- Updates given to Significant Other. Time allowed for questions and concerns.

## 2019-02-13 NOTE — Progress Notes (Signed)
Pharmacy Antibiotic Note  [name] is a 73 y.o. male admitted on 02/08/2019 with COVID PNA.  Pharmacy has been consulted for vancomycin dosing for CAP; also on ceftriaxone and azithromycin. CT chest with possible superimposed bacterial PNA. He is afebrile, WBC down to 11.6. Renal function is stable.  Plan: Vancomycin 2000 mg IV load then 1750 mg IV q24h Goal AUC 400-550 Expected AUC: 535.9 SCr used: 1.08 Monitor renal function, clinical progress, MRSA PCR result   Height: 5\' 11"  (180.3 cm) IBW/kg (Calculated) : 75.3  Temp (24hrs), Avg:98.1 F (36.7 C), Min:97.6 F (36.4 C), Max:98.9 F (37.2 C)  Recent Labs  Lab 02/12/2019 0420 02/10/19 0157 02/11/19 0407 02/12/19 0328 02/13/19 0420  WBC 7.9 6.7 12.7* 15.1* 11.6*  CREATININE 0.95 0.88 1.12 1.08 1.08    Estimated Creatinine Clearance: 78.2 mL/min (by C-G formula based on SCr of 1.08 mg/dL).    No Known Allergies  Antimicrobials this admission: azith 12/30 >> (1/3) ceftraixone 12/30 >> (1/3) Vancomycin 12/30 >>  Dose adjustments this admission:   Microbiology results: 12/21 BCx: neg 12/23 BCx: neg 12/23 COVID positive  Thank you for involving pharmacy in this patient's care.  Renold Genta, PharmD, BCPS Clinical Pharmacist Clinical phone for 02/13/2019 until 3:30p is [phone] 02/13/2019 9:14 AM  **Pharmacist phone directory can be found on [email or web address] listed under [place]**

## 2019-02-13 NOTE — Progress Notes (Signed)
PT Cancellation Note  Patient Details Name: [name] MRN: [id number] DOB: 06-21-1945   Cancelled Treatment:    Reason Eval/Treat Not Completed: Medical issues which prohibited therapy Per RN, patient is to be on MEDICAL HOLD today .  [name], PT # [phone] CGV cell  [name] 02/13/2019, 11:30 AM

## 2019-02-13 NOTE — Progress Notes (Signed)
Pt up to chair. Again,O2 sats dropped while getting up and pt had some shortness of breath. With deep breathing,  Flutter valve and IS pt's breathing improved. 3LO2 sating 91%

## 2019-02-13 NOTE — Progress Notes (Signed)
[facility] PROGRESS NOTE    Progress Note  LAYMON STOCKERT  RFX:[id number] DOB: 03/11/1945 DOA: 02/14/2019 PCP: Glean Hess, MD     Brief Narrative:   [name] is an 73 y.o. male past medical history significant for squamous cell carcinoma of the lung status post chemotherapy in 2018, diabetes, morbid obesity, vitamin D deficiency, hypertension presents to [place] with shortness of breath and acute hypoxic respiratory failure due to COVID-19 pneumonia.  He was given IV steroids, remdesivir and Actemra and transferred to [facility] on 15 L high flow nasal cannula  Assessment/Plan:   Acute hypoxemic respiratory failure due to severe acute respiratory syndrome coronavirus 2 (SARS-CoV-2) disease (HCC) and bacterial PNA: Patient is requiring 6 L of high flow nasal cannula to keep saturations greater than 88%. He has completed his course of IV remdesivir last dose on 02/11/2019. He is currently on day 7 of steroids, discontinue IV Solu-Medrol start dexamethasone for 3 days. Try to keep the patient peripherally 16 hours a day, if not prone out of bed to chair. Patient started complaining of of Chest pain and difficulty breathing in the morning, was hypoxic and tachycardic. CT angio of chest was done stat showed new PNA and pneumomediastinum. Start  vanc and azithro and rocephin, get blood cultures and sputum cultures. We will try to suppress the cough is much as possible start him on Robitussin and Tussionex. A 12-lead EKG was done that showed sinus tachycardia.  New small pneumomediastinum: CT chest done today showed new pneumomediastinum. Get daily CXR, start tussionex and schedule and robitussim.  New sinus tachycardia: Likely due to pneumomediastinum and/or new bacterial infection, continue metoprolol scheduled. We will start on IV metoprolol as needed for heart rate greater than 100.  History of squamous cell carcinoma of the lung: Continue outpatient  treatment with oncologist.  Obesity: Follow-up with PCP with a BMI of 34.  GERD: Continue PPI.  Essential hypertension: Seems to be fairly controlled, mildly tachycardic this morning we will continue to monitor closely.  Elevated D-dimer: On 01/28/2019 his D-dimer was above 20 he was started on full dose anticoagulation. Lower extremity Doppler was negative for DVT. CT chest with contrast on 02/06/2019: Bilateral groundglass opacities within the left and right lung nonspecific. We will check a CT angio of the chest his oxygen requirement has doubled over the last 24 hours. We will decrease his Lovenox to subtherapeutic dose.  Asymptomatic elevated LFTs: Elevated LFTs likely due to COVID-19.  Hypokalemia: Repleted.   DVT prophylaxis: lovenox Family Communication: Disposition Plan/Barrier to D/C: unable to determine Code Status:     Code Status Orders  (From admission, onward)         Start     Ordered   02/08/2019 2115  Full code  Continuous     01/23/2019 2116        Code Status History    Date Active Date Inactive Code Status Order ID Comments User Context   02/05/2019 0013 01/25/2019 2051 Full Code [id number]  Elwyn Reach, MD ED   Advance Care Planning Activity        IV Access:    Peripheral IV   Procedures and diagnostic studies:   No results found.   Medical Consultants:    None.  Anti-Infectives:   IV vancomycin Rocephin and azithromycin  Subjective:    [name] relates his breathing is better than this morning, but not better when than when he came in he continues to  have a persistent cough feels nauseated and is anorexic  Objective:    Vitals:   02/12/19 1547 02/12/19 2009 02/13/19 0055 02/13/19 0331  BP: 114/67 125/79 138/62 130/87  Pulse: 99 95 96 96  Resp: 18 20 (!) 22 20  Temp: 97.9 F (36.6 C) 98.9 F (37.2 C) 98.3 F (36.8 C) 97.6 F (36.4 C)  TempSrc: Oral Oral Oral Oral  SpO2: 94% 90% 92% 91%  Height:        SpO2: 91 % O2 Flow Rate (L/min): 6 L/min   Intake/Output Summary (Last 24 hours) at 02/13/2019 0708 Last data filed at 02/12/2019 1630 Gross per 24 hour  Intake 385 ml  Output 650 ml  Net -265 ml   There were no vitals filed for this visit.  Exam: General exam: In no acute distress. Respiratory system: Good air movement and diffuse crackles bilaterally. Cardiovascular system: S1 & S2 heard, RRR. No JVD. Gastrointestinal system: Abdomen is nondistended, soft and nontender.  Central nervous system: Alert and oriented. No focal neurological deficits. Extremities: No pedal edema. Skin: No rashes, lesions or ulcers Psychiatry: Judgement and insight appear normal. Mood & affect appropriate.   Data Reviewed:    Labs: Basic Metabolic Panel: Recent Labs  Lab 02/08/19 0657 02/10/2019 0420 02/10/19 0157 02/11/19 0407 02/12/19 0328 02/13/19 0420  NA 141 142 139 141 142 140  K 3.7 4.0 3.9 3.7 3.4* 4.7  CL 109 109 101 105 102 105  CO2 21* '23 22 24 24 24  ' GLUCOSE 113* 120* 110* 134* 111* 124*  BUN '16 20 22 ' 29* 35* 37*  CREATININE 1.04 0.95 0.88 1.12 1.08 1.08  CALCIUM 8.3* 8.6* 8.2* 8.7* 8.8* 8.6*  MG 2.4 2.3  --  2.4 2.5* 2.5*   GFR Estimated Creatinine Clearance: 78.2 mL/min (by C-G formula based on SCr of 1.08 mg/dL). Liver Function Tests: Recent Labs  Lab 01/30/2019 0420 02/10/19 0157 02/11/19 0407 02/12/19 0328 02/13/19 0420  AST 66* 65* 61* 54* 47*  ALT 47* 58* 80* 90* 89*  ALKPHOS 46 52 74 81 82  BILITOT 0.6 1.0 0.7 1.1 1.3*  PROT 7.4 7.5 7.7 7.5 7.4  ALBUMIN 3.3* 3.3* 3.5 3.6 3.4*   No results for input(s): LIPASE, AMYLASE in the last 168 hours. No results for input(s): AMMONIA in the last 168 hours. Coagulation profile No results for input(s): INR, PROTIME in the last 168 hours. COVID-19 Labs  Recent Labs    02/11/19 0407 02/12/19 0328 02/13/19 0420  DDIMER >20.00* 13.06* 3.97*  CRP 0.9 0.6  --     Lab Results  Component Value Date    SARSCOV2NAA POSITIVE (A) 02/06/2019    CBC: Recent Labs  Lab 01/23/2019 0420 02/10/19 0157 02/11/19 0407 02/12/19 0328 02/13/19 0420  WBC 7.9 6.7 12.7* 15.1* 11.6*  NEUTROABS 6.5 5.8 11.5* 13.6* 10.4*  HGB 13.4 13.5 14.3 14.8 14.4  HCT 39.5 42.0 45.1 45.9 45.0  MCV 82.3 87.0 87.2 88.3 87.4  PLT 186 212 293 346 322   Cardiac Enzymes: No results for input(s): CKTOTAL, CKMB, CKMBINDEX, TROPONINI in the last 168 hours. BNP (last 3 results) No results for input(s): PROBNP in the last 8760 hours. CBG: Recent Labs  Lab 02/11/19 1708 02/11/19 1940 02/12/19 0729 02/12/19 1713 02/12/19 1938  GLUCAP 134* 150* 106* 119* 120*   D-Dimer: Recent Labs    02/12/19 0328 02/13/19 0420  DDIMER 13.06* 3.97*   Hgb A1c: No results for input(s): HGBA1C in the last 72 hours. Lipid Profile: No  results for input(s): CHOL, HDL, LDLCALC, TRIG, CHOLHDL, LDLDIRECT in the last 72 hours. Thyroid function studies: No results for input(s): TSH, T4TOTAL, T3FREE, THYROIDAB in the last 72 hours.  Invalid input(s): FREET3 Anemia work up: No results for input(s): VITAMINB12, FOLATE, FERRITIN, TIBC, IRON, RETICCTPCT in the last 72 hours. Sepsis Labs: Recent Labs  Lab 02/10/19 0157 02/11/19 0407 02/12/19 0328 02/13/19 0420  PROCALCITON <0.10 <0.10 <0.10  --   WBC 6.7 12.7* 15.1* 11.6*   Microbiology Recent Results (from the past 240 hour(s))  SARS Coronavirus 2 Ag (30 min TAT) - Nasal Swab (BD Veritor Kit)     Status: None   Collection Time: 02/04/19  5:56 PM   Specimen: Nasal Swab (BD Veritor Kit)  Result Value Ref Range Status   SARS Coronavirus 2 Ag NEGATIVE NEGATIVE Final    Comment: (NOTE) SARS-CoV-2 antigen NOT DETECTED.  Negative results are presumptive.  Negative results do not preclude SARS-CoV-2 infection and should not be used as the sole basis for treatment or other patient management decisions, including infection  control decisions, particularly in the presence of clinical  signs and  symptoms consistent with COVID-19, or in those who have been in contact with the virus.  Negative results must be combined with clinical observations, patient history, and epidemiological information. The expected result is Negative. Fact Sheet for Patients: PodPark.tn Fact Sheet for Healthcare Providers: GiftContent.is This test is not yet approved or cleared by the [place] FDA and  has been authorized for detection and/or diagnosis of SARS-CoV-2 by FDA under an Emergency Use Authorization (EUA).  This EUA will remain in effect (meaning this test can be used) for the duration of  the COVID-19 de claration under Section 564(b)(1) of the Act, 21 U.S.C. section 360bbb-3(b)(1), unless the authorization is terminated or revoked sooner. Performed at [facility], [address], [place], [place] [address]   Blood culture (routine x 2)     Status: None   Collection Time: 02/04/19 10:41 PM   Specimen: BLOOD  Result Value Ref Range Status   Specimen Description BLOOD RIGHT ANTECUBITAL  Final   Special Requests   Final    BOTTLES DRAWN AEROBIC AND ANAEROBIC Blood Culture results may not be optimal due to an excessive volume of blood received in culture bottles   Culture   Final    NO GROWTH 5 DAYS Performed at [facility], [address], [place], [place] [address]    Report Status 02/13/2019 FINAL  Final  Blood culture (routine x 2)     Status: None   Collection Time: 02/04/19 11:12 PM   Specimen: BLOOD  Result Value Ref Range Status   Specimen Description BLOOD BLOOD RIGHT HAND  Final   Special Requests   Final    BOTTLES DRAWN AEROBIC AND ANAEROBIC Blood Culture adequate volume   Culture   Final    NO GROWTH 5 DAYS Performed at [facility], [address] [place], [place] [address]    Report Status 01/23/2019 FINAL  Final  CULTURE, BLOOD (ROUTINE X 2) w Reflex to ID Panel      Status: None   Collection Time: 02/06/19  8:36 AM   Specimen: BLOOD  Result Value Ref Range Status   Specimen Description BLOOD BLOOD LEFT HAND  Final   Special Requests   Final    BOTTLES DRAWN AEROBIC AND ANAEROBIC Blood Culture results may not be optimal due to an excessive volume of blood received in culture  bottles   Culture   Final    NO GROWTH 5 DAYS Performed at [facility], [address] [place], [place] [address]    Report Status 02/11/2019 FINAL  Final  CULTURE, BLOOD (ROUTINE X 2) w Reflex to ID Panel     Status: None   Collection Time: 02/06/19  8:43 AM   Specimen: BLOOD  Result Value Ref Range Status   Specimen Description BLOOD BLOOD RIGHT HAND  Final   Special Requests   Final    BOTTLES DRAWN AEROBIC AND ANAEROBIC Blood Culture results may not be optimal due to an excessive volume of blood received in culture bottles   Culture   Final    NO GROWTH 5 DAYS Performed at [facility], [address] [place], [place] [address]    Report Status 02/11/2019 FINAL  Final  SARS CORONAVIRUS 2 (TAT 6-24 HRS) Nasopharyngeal Nasopharyngeal Swab     Status: Abnormal   Collection Time: 02/06/19  8:17 PM   Specimen: Nasopharyngeal Swab  Result Value Ref Range Status   SARS Coronavirus 2 POSITIVE (A) NEGATIVE Final    Comment: RESULT CALLED TO, READ BACK BY AND VERIFIED WITH: Gayland Curry RN 11:00 02/07/19 ([email or web address]) (NOTE) SARS-CoV-2 target nucleic acids are DETECTED. The SARS-CoV-2 RNA is generally detectable in upper and lower respiratory specimens during the acute phase of infection. Positive results are indicative of the presence of SARS-CoV-2 RNA. Clinical correlation with patient history and other diagnostic information is  necessary to determine patient infection status. Positive results do not rule out bacterial infection or co-infection with other viruses.  The expected result is Negative. Fact Sheet for  Patients: SugarRoll.be Fact Sheet for Healthcare Providers: https://www.woods-mathews.com/ This test is not yet approved or cleared by the [place] FDA and  has been authorized for detection and/or diagnosis of SARS-CoV-2 by FDA under an Emergency Use Authorization (EUA). This EUA will remain  in effect (meaning this test can be used) for  the duration of the COVID-19 declaration under Section 564(b)(1) of the Act, 21 U.S.C. section 360bbb-3(b)(1), unless the authorization is terminated or revoked sooner. Performed at [facility], [place] [address], [place], [place] [address]      Medications:   . vitamin C  500 mg Oral Daily  . aspirin EC  81 mg Oral Daily  . atorvastatin  10 mg Oral Daily  . docusate sodium  100 mg Oral Daily  . enoxaparin (LOVENOX) injection  1 mg/kg Subcutaneous Q12H  . fluticasone  2 puff Inhalation BID  . Ipratropium-Albuterol  1 puff Inhalation Q6H  . methylPREDNISolone (SOLU-MEDROL) injection  40 mg Intravenous Q12H  . metoprolol tartrate  50 mg Oral BID  . pantoprazole  40 mg Oral Daily  . terazosin  1 mg Oral QHS  . zinc sulfate  220 mg Oral Daily   Continuous Infusions:    LOS: 4 days   Charlynne Cousins  [facility]  02/13/2019, 7:08 AM

## 2019-02-14 ENCOUNTER — Inpatient Hospital Stay (HOSPITAL_COMMUNITY): Payer: Medicare HMO

## 2019-02-14 LAB — COMPREHENSIVE METABOLIC PANEL
ALT: 79 U/L — ABNORMAL HIGH (ref 0–44)
AST: 41 U/L (ref 15–41)
Albumin: 3.2 g/dL — ABNORMAL LOW (ref 3.5–5.0)
Alkaline Phosphatase: 94 U/L (ref 38–126)
Anion gap: 16 — ABNORMAL HIGH (ref 5–15)
BUN: 34 mg/dL — ABNORMAL HIGH (ref 8–23)
CO2: 23 mmol/L (ref 22–32)
Calcium: 8.4 mg/dL — ABNORMAL LOW (ref 8.9–10.3)
Chloride: 99 mmol/L (ref 98–111)
Creatinine, Ser: 1.19 mg/dL (ref 0.61–1.24)
GFR calc Af Amer: >60 mL/min (ref >60)
GFR calc non Af Amer: >60 mL/min (ref >60)
Glucose, Bld: 75 mg/dL (ref 70–99)
Potassium: 4.4 mmol/L (ref 3.5–5.1)
Sodium: 138 mmol/L (ref 135–145)
Total Bilirubin: 0.9 mg/dL (ref 0.3–1.2)
Total Protein: 6.7 g/dL (ref 6.5–8.1)

## 2019-02-14 LAB — CBC WITH DIFFERENTIAL/PLATELET
Abs Immature Granulocytes: 0.07 10*3/uL (ref 0.00–0.07)
Basophils Absolute: 0.1 10*3/uL (ref 0.0–0.1)
Basophils Relative: 0 %
Eosinophils Absolute: 0 10*3/uL (ref 0.0–0.5)
Eosinophils Relative: 0 %
HCT: 44.1 % (ref 39.0–52.0)
Hemoglobin: 14.1 g/dL (ref 13.0–17.0)
Immature Granulocytes: 0 %
Lymphocytes Relative: 5 %
Lymphs Abs: 0.9 10*3/uL (ref 0.7–4.0)
MCH: 28 pg (ref 26.0–34.0)
MCHC: 32 g/dL (ref 30.0–36.0)
MCV: 87.7 fL (ref 80.0–100.0)
Monocytes Absolute: 0.1 10*3/uL (ref 0.1–1.0)
Monocytes Relative: 1 %
Neutro Abs: 14.9 10*3/uL — ABNORMAL HIGH (ref 1.7–7.7)
Neutrophils Relative %: 94 %
Platelets: 289 10*3/uL (ref 150–400)
RBC: 5.03 MIL/uL (ref 4.22–5.81)
RDW: 13.6 % (ref 11.5–15.5)
WBC: 16 10*3/uL — ABNORMAL HIGH (ref 4.0–10.5)
nRBC: 0.1 % (ref 0.0–0.2)

## 2019-02-14 LAB — D-DIMER, QUANTITATIVE: D-Dimer, Quant: 7.73 ug/mL-FEU — ABNORMAL HIGH (ref 0.00–0.50)

## 2019-02-14 LAB — BRAIN NATRIURETIC PEPTIDE: B Natriuretic Peptide: 62.9 pg/mL (ref 0.0–100.0)

## 2019-02-14 LAB — MRSA PCR SCREENING: MRSA by PCR: NEGATIVE

## 2019-02-14 LAB — PROCALCITONIN: Procalcitonin: 0.45 ng/mL

## 2019-02-14 LAB — MAGNESIUM: Magnesium: 2.6 mg/dL — ABNORMAL HIGH (ref 1.7–2.4)

## 2019-02-14 LAB — C-REACTIVE PROTEIN: CRP: 2.3 mg/dL — ABNORMAL HIGH (ref <1.0)

## 2019-02-14 MED ORDER — METOPROLOL TARTRATE 25 MG PO TABS
100.0000 mg | ORAL_TABLET | Freq: Two times a day (BID) | ORAL | Status: DC
  Administered 2019-02-14 – 2019-02-19 (×11): 100 mg via ORAL
  Filled 2019-02-14 (×13): qty 4

## 2019-02-14 MED ORDER — ENOXAPARIN SODIUM 60 MG/0.6ML ~~LOC~~ SOLN
0.5000 mg/kg | Freq: Two times a day (BID) | SUBCUTANEOUS | Status: DC
  Administered 2019-02-14 – 2019-02-16 (×5): 55 mg via SUBCUTANEOUS
  Filled 2019-02-14 (×5): qty 0.6

## 2019-02-14 MED ORDER — LORAZEPAM 0.5 MG PO TABS
0.5000 mg | ORAL_TABLET | Freq: Four times a day (QID) | ORAL | Status: DC | PRN
  Administered 2019-02-14 – 2019-02-19 (×6): 0.5 mg via ORAL
  Filled 2019-02-14 (×6): qty 1

## 2019-02-14 NOTE — Evaluation (Signed)
Physical Therapy Treatment Patient Details Name: [name] MRN: [id number] DOB: 1945/10/28 Today's Date: 02/14/2019    History of Present Illness Brief Narrative  [name]  is a 73 y.o. male,73 y.o. male with medical history significant of squamous cell carcinoma of the lung status post treatment since 2018, BPH, prediabetic, morbid obesity, vitamin D deficiency, vitamin B-12 deficiency, HTN, GERD, CAD who presented to [place] with shortness of breath was diagnosed with acute hypoxic respiratory failure due to COVID-19 pneumonia.  Was appropriately treated with IV steroids, remdesivir and Actemra thereafter transferred to [facility] hospital on 15 L high flow nasal cannula oxygen.    PT Comments    He preferred to stay at bed level today, indicating that he was very tired. Per chart, he has now been diagnosed with pneumomediastinum. He did agree to bed repositioning and participating in ex format. He was instructed in basic UE/LE ex and yellow theraband was issued for HEP. He indicated that the "physician had advised against using the IS and flutter for now. He should benefit from skilled PT for optimal functional outcomes addressing energy conservation and general strengthening.    Follow Up Recommendations  Home health PT     Equipment Recommendations  None recommended by PT    Recommendations for Other Services       Precautions / Restrictions Precautions Precautions: Fall Precaution Comments: Related to general weakness and reduced energy conservation. Restrictions Weight Bearing Restrictions: No    Mobility  Bed Mobility Overal bed mobility: Needs Assistance Bed Mobility: Rolling(Mod Assist to reposition in bed for optimal comfort.) Rolling: Mod assist         General bed mobility comments: He stated that he did not want to get OOB today- would participate in ex format.  Transfers                    Ambulation/Gait                  Stairs             Wheelchair Mobility    Modified Rankin (Stroke Patients Only)       Balance                                            Cognition Arousal/Alertness: Awake/alert Behavior During Therapy: WFL for tasks assessed/performed                                          Exercises General Exercises - Lower Extremity Ankle Circles/Pumps: AROM Quad Sets: AROM Gluteal Sets: AROM Short Arc Quad: AROM Hip ABduction/ADduction: AROM Straight Leg Raises: AROM Hip Flexion/Marching: AROM Shoulder Exercises Shoulder Flexion: AROM Shoulder Extension: AROM Shoulder ABduction: AROM Elbow Flexion: AROM Elbow Extension: AROM Other Exercises Other Exercises: Instructed in basic UE and LE ex and yellow theraband issued. Good return demo. Printed instructions issued.-> per patient, he "was advised to rest from the IS and flutter exercises per the physician".    General Comments General comments (skin integrity, edema, etc.): Grossly fairly good tone and turgor. No edema noted. Monitor ears closely secondary to [place]/NRB for potential to cause pressure areas/sores      Pertinent Vitals/Pain Pain Assessment: No/denies pain    Home Living  Prior Function            PT Goals (current goals can now be found in the care plan section) Acute Rehab PT Goals Patient Stated Goal: Just indicates that he wants to go home and be with his family- but be able to do as much to help himself as is possible. PT Goal Formulation: With patient Time For Goal Achievement: 02/25/19 Potential to Achieve Goals: Good    Frequency           PT Plan      Co-evaluation              AM-PAC PT "6 Clicks" Mobility   Outcome Measure  Help needed turning from your back to your side while in a flat bed without using bedrails?: A Little Help needed moving from lying on your back to sitting on the side of a flat bed  without using bedrails?: A Little Help needed moving to and from a bed to a chair (including a wheelchair)?: A Little Help needed standing up from a chair using your arms (e.g., wheelchair or bedside chair)?: A Little Help needed to walk in hospital room?: A Little Help needed climbing 3-5 steps with a railing? : A Lot 6 Click Score: 17    End of Session     Patient left: in bed;with call bell/phone within reach(He did not want to get OOB today- stated "he was very tired")         Time: 9311-2162 PT Time Calculation (min) (ACUTE ONLY): 30 min  Charges:  $Therapeutic Exercise: 8-22 mins $Therapeutic Activity: 8-22 mins                    [name], PT # [phone] CGV cell    [name] 02/14/2019, 3:50 PM

## 2019-02-14 NOTE — Progress Notes (Signed)
[facility] PROGRESS NOTE    Progress Note  [name]  KGM:[id number] DOB: 04-Jul-1945 DOA: 01/24/2019 PCP: Glean Hess, MD     Brief Narrative:   [name] is an 73 y.o. male past medical history significant for squamous cell carcinoma of the lung status post chemotherapy in 2018, diabetes, morbid obesity, vitamin D deficiency, hypertension presents to [place] with shortness of breath and acute hypoxic respiratory failure due to COVID-19 pneumonia.  He was given IV steroids, remdesivir and Actemra and transferred to [facility] on 15 L high flow nasal cannula  Assessment/Plan:   Acute hypoxemic respiratory failure due to severe acute respiratory syndrome coronavirus 2 (SARS-CoV-2) disease (HCC) and bacterial PNA: Patient is now requiring 15 L of high flow nasal cannula with an FiO2 of 100%,  To keep saturations greater than 93%. He has completed his course of IV remdesivir his last dose was on 02/11/2019. He is currently on steroids we will continue for a total of 10 days. Keep the patient prone for 16 hours a day if not prone out of bed to chair. CT angio of the chest on 02/13/2019 showed new pneumonia and pneumomediastinum (see below for details).  He was started empirically on IV vancomycin azithro and Rocephin sputum culture sent blood cultures have remained negative till date. His procalcitonin was less than 0.1 initially, now it is rising, along with a rising leukocytosis  New small pneumomediastinum: CT of the chest done on 02/13/2019 showed new pneumomediastinum. He was started on Tussionex and Robitussin to decrease his cough. Chest x-ray this morning showed persistent worsening pneumomediastinum.  Does relate his breathing and his chest pain are improved.  New sinus tachycardia: Sinus tachycardia has improved he has intermittent episodes of being above 100 will increase metoprolol, continue metoprolol IV as needed.  History of squamous cell  carcinoma of the lung: Continue outpatient treatment with oncologist.  Obesity: Follow-up with PCP with a BMI of 34.  GERD: Continue PPI.  Essential hypertension: Seems to be fairly controlled, mildly tachycardic this morning we will continue to monitor closely.  Elevated D-dimer: CRP and D-dimer rising, his D-dimer is greater than 5 we will put on subtherapeutic Lovenox. CT angio of the chest done on 02/13/2019 was negative for pulmonary embolism, lower extremity Dopplers negative for DVT.  Asymptomatic elevated LFTs: Elevated LFTs likely due to COVID-19.  Hypokalemia: Repleted.  Ethics/palliative care advance goals of care: I spent over 40 minutes speaking with [name] today about his CODE STATUS and after long deliberation and many questions he would like he would like full aggressive treatment, but would not like to be intubated or resuscitated. We did speak about that he has a long hospital stay ahead of him and that we would try to do what is best for him and what he would like.  He was happy and in a good emotional state today after our conversation.   DVT prophylaxis: lovenox Family Communication: Disposition Plan/Barrier to D/C: unable to determine Code Status:DNR   IV Access:    Peripheral IV   Procedures and diagnostic studies:   CT ANGIO CHEST PE W OR WO CONTRAST  Result Date: 02/13/2019 CLINICAL DATA:  Dyspnea on exertion EXAM: CT ANGIOGRAPHY CHEST WITH CONTRAST TECHNIQUE: Multidetector CT imaging of the chest was performed using the standard protocol during bolus administration of intravenous contrast. Multiplanar CT image reconstructions and MIPs were obtained to evaluate the vascular anatomy. CONTRAST:  68m OMNIPAQUE IOHEXOL 350 MG/ML SOLN COMPARISON:  Seven  days ago FINDINGS: Cardiovascular: Normal heart size. No pericardial effusion. There is aortic and coronary atherosclerotic calcification. No pulmonary artery filling defects. Mediastinum/Nodes: Mild  upper pneumomediastinum that is new. Lungs/Pleura: Patchy ground-glass opacity in the bilateral lungs in keeping with history of COVID-19. The opacity is asymmetrically dense at the medial left base. There is left perihilar radiation fibrosis without worrisome masslike characteristics. No effusion or edema. Upper Abdomen: Renal and hepatic cystic densities. Musculoskeletal: Spondylosis. These results will be called to the ordering clinician or representative by the Radiologist Assistant, and communication documented in the PACS or zVision Dashboard. Review of the MIP images confirms the above findings. IMPRESSION: 1. COVID pneumonia pattern with progression from study 7 days ago. There is asymmetric dense segmental involvement in the left lower lobe, question superimposed bacterial pneumonia. 2. Mild pneumomediastinum that is new from prior. 3. Aortic Atherosclerosis (ICD10-I70.0) and Emphysema (ICD10-J43.9). 4. Left perihilar radiation fibrosis without adverse feature. 5. Negative for pulmonary embolism. Electronically Signed   By: Monte Fantasia M.D.   On: 02/13/2019 08:39   DG CHEST PORT 1 VIEW  Addendum Date: 02/14/2019   ADDENDUM REPORT: 02/14/2019 09:03 ADDENDUM: Called by the nurse that the patient is not intubated. The tube over the patient's neck is thus presumably artifact/external. Electronically Signed   By: Monte Fantasia M.D.   On: 02/14/2019 09:03   Result Date: 02/14/2019 CLINICAL DATA:  Dyspnea EXAM: PORTABLE CHEST 1 VIEW COMPARISON:  02/04/2019 FINDINGS: Pneumomediastinum which has likely progressed from chest CT yesterday. Endotracheal tube with tip above the clavicular heads by 2 cm and above the carina at an estimated 13 cm . Right-sided port with chronic looping above the SVC. Pulmonary infiltrates with asymmetric density on the left, also seen previously. Left perihilar fibrosis. IMPRESSION: 1. High endotracheal tube, tip above the clavicular heads. Suggest advancement by ~3 cm. 2.  Pneumomediastinum which is likely progressed from chest CT yesterday. 3. Stable airspace disease. Electronically Signed: By: Monte Fantasia M.D. On: 02/14/2019 08:52     Medical Consultants:    None.  Anti-Infectives:   IV vancomycin Rocephin and azithromycin  Subjective:    [name] relates his breathing is better this morning as well as pain.  Objective:    Vitals:   02/14/19 0400 02/14/19 0819 02/14/19 1017 02/14/19 1118  BP: 118/83 132/72  130/74  Pulse:  100 85 82  Resp:  '16 18 20  ' Temp: 98.6 F (37 C) 98.6 F (37 C)  98.1 F (36.7 C)  TempSrc: Oral Oral  Oral  SpO2:  100% 100% 94%  Height:       SpO2: 94 % O2 Flow Rate (L/min): 15 L/min FiO2 (%): 100 %   Intake/Output Summary (Last 24 hours) at 02/14/2019 1131 Last data filed at 02/14/2019 0900 Gross per 24 hour  Intake 500 ml  Output 400 ml  Net 100 ml   There were no vitals filed for this visit.  Exam: General exam: In no acute distress. Respiratory system: Good air movement and diffuse crackles. Cardiovascular system: S1 & S2 heard, RRR. No JVD. Gastrointestinal system: Abdomen is nondistended, soft and nontender.  Central nervous system: Alert and oriented. No focal neurological deficits. Extremities: No pedal edema. Skin: No rashes, lesions or ulcers Psychiatry: Judgement and insight appear normal. Mood & affect appropriate.    Data Reviewed:    Labs: Basic Metabolic Panel: Recent Labs  Lab 02/03/2019 0420 02/10/19 0157 02/11/19 0407 02/12/19 0328 02/13/19 0420 02/14/19 0135  NA 142 139 141  142 140 138  K 4.0 3.9 3.7 3.4* 4.7 4.4  CL 109 101 105 102 105 99  CO2 '23 22 24 24 24 23  ' GLUCOSE 120* 110* 134* 111* 124* 75  BUN 20 22 29* 35* 37* 34*  CREATININE 0.95 0.88 1.12 1.08 1.08 1.19  CALCIUM 8.6* 8.2* 8.7* 8.8* 8.6* 8.4*  MG 2.3  --  2.4 2.5* 2.5* 2.6*   GFR Estimated Creatinine Clearance: 71 mL/min (by C-G formula based on SCr of 1.19 mg/dL). Liver Function  Tests: Recent Labs  Lab 02/10/19 0157 02/11/19 0407 02/12/19 0328 02/13/19 0420 02/14/19 0135  AST 65* 61* 54* 47* 41  ALT 58* 80* 90* 89* 79*  ALKPHOS 52 74 81 82 94  BILITOT 1.0 0.7 1.1 1.3* 0.9  PROT 7.5 7.7 7.5 7.4 6.7  ALBUMIN 3.3* 3.5 3.6 3.4* 3.2*   No results for input(s): LIPASE, AMYLASE in the last 168 hours. No results for input(s): AMMONIA in the last 168 hours. Coagulation profile No results for input(s): INR, PROTIME in the last 168 hours. COVID-19 Labs  Recent Labs    02/12/19 0328 02/13/19 0420 02/14/19 0135  DDIMER 13.06* 3.97* 7.73*  CRP 0.6 0.6 2.3*    Lab Results  Component Value Date   SARSCOV2NAA POSITIVE (A) 02/06/2019    CBC: Recent Labs  Lab 02/10/19 0157 02/11/19 0407 02/12/19 0328 02/13/19 0420 02/14/19 0135  WBC 6.7 12.7* 15.1* 11.6* 16.0*  NEUTROABS 5.8 11.5* 13.6* 10.4* 14.9*  HGB 13.5 14.3 14.8 14.4 14.1  HCT 42.0 45.1 45.9 45.0 44.1  MCV 87.0 87.2 88.3 87.4 87.7  PLT 212 293 346 322 289   Cardiac Enzymes: No results for input(s): CKTOTAL, CKMB, CKMBINDEX, TROPONINI in the last 168 hours. BNP (last 3 results) No results for input(s): PROBNP in the last 8760 hours. CBG: Recent Labs  Lab 02/12/19 1713 02/12/19 1938 02/13/19 0715 02/13/19 1119 02/13/19 1701  GLUCAP 119* 120* 108* 118* 156*   D-Dimer: Recent Labs    02/13/19 0420 02/14/19 0135  DDIMER 3.97* 7.73*   Hgb A1c: No results for input(s): HGBA1C in the last 72 hours. Lipid Profile: No results for input(s): CHOL, HDL, LDLCALC, TRIG, CHOLHDL, LDLDIRECT in the last 72 hours. Thyroid function studies: No results for input(s): TSH, T4TOTAL, T3FREE, THYROIDAB in the last 72 hours.  Invalid input(s): FREET3 Anemia work up: No results for input(s): VITAMINB12, FOLATE, FERRITIN, TIBC, IRON, RETICCTPCT in the last 72 hours. Sepsis Labs: Recent Labs  Lab 02/11/19 0407 02/12/19 0328 02/13/19 0420 02/13/19 0938 02/14/19 0135  PROCALCITON <0.10 <0.10  --   <0.10 0.45  WBC 12.7* 15.1* 11.6*  --  16.0*   Microbiology Recent Results (from the past 240 hour(s))  SARS Coronavirus 2 Ag (30 min TAT) - Nasal Swab (BD Veritor Kit)     Status: None   Collection Time: 02/04/19  5:56 PM   Specimen: Nasal Swab (BD Veritor Kit)  Result Value Ref Range Status   SARS Coronavirus 2 Ag NEGATIVE NEGATIVE Final    Comment: (NOTE) SARS-CoV-2 antigen NOT DETECTED.  Negative results are presumptive.  Negative results do not preclude SARS-CoV-2 infection and should not be used as the sole basis for treatment or other patient management decisions, including infection  control decisions, particularly in the presence of clinical signs and  symptoms consistent with COVID-19, or in those who have been in contact with the virus.  Negative results must be combined with clinical observations, patient history, and epidemiological information. The expected result is  Negative. Fact Sheet for Patients: PodPark.tn Fact Sheet for Healthcare Providers: GiftContent.is This test is not yet approved or cleared by the [place] FDA and  has been authorized for detection and/or diagnosis of SARS-CoV-2 by FDA under an Emergency Use Authorization (EUA).  This EUA will remain in effect (meaning this test can be used) for the duration of  the COVID-19 de claration under Section 564(b)(1) of the Act, 21 U.S.C. section 360bbb-3(b)(1), unless the authorization is terminated or revoked sooner. Performed at [facility], [address], [place], [place] [address]   Blood culture (routine x 2)     Status: None   Collection Time: 02/04/19 10:41 PM   Specimen: BLOOD  Result Value Ref Range Status   Specimen Description BLOOD RIGHT ANTECUBITAL  Final   Special Requests   Final    BOTTLES DRAWN AEROBIC AND ANAEROBIC Blood Culture results may not be optimal due to an excessive volume of blood received in culture  bottles   Culture   Final    NO GROWTH 5 DAYS Performed at [facility], [address] [place], [place] [address]    Report Status 01/21/2019 FINAL  Final  Blood culture (routine x 2)     Status: None   Collection Time: 02/04/19 11:12 PM   Specimen: BLOOD  Result Value Ref Range Status   Specimen Description BLOOD BLOOD RIGHT HAND  Final   Special Requests   Final    BOTTLES DRAWN AEROBIC AND ANAEROBIC Blood Culture adequate volume   Culture   Final    NO GROWTH 5 DAYS Performed at [facility], [address] [place], [place] [address]    Report Status 02/07/2019 FINAL  Final  CULTURE, BLOOD (ROUTINE X 2) w Reflex to ID Panel     Status: None   Collection Time: 02/06/19  8:36 AM   Specimen: BLOOD  Result Value Ref Range Status   Specimen Description BLOOD BLOOD LEFT HAND  Final   Special Requests   Final    BOTTLES DRAWN AEROBIC AND ANAEROBIC Blood Culture results may not be optimal due to an excessive volume of blood received in culture bottles   Culture   Final    NO GROWTH 5 DAYS Performed at [facility], [address] [place], [place] [address]    Report Status 02/11/2019 FINAL  Final  CULTURE, BLOOD (ROUTINE X 2) w Reflex to ID Panel     Status: None   Collection Time: 02/06/19  8:43 AM   Specimen: BLOOD  Result Value Ref Range Status   Specimen Description BLOOD BLOOD RIGHT HAND  Final   Special Requests   Final    BOTTLES DRAWN AEROBIC AND ANAEROBIC Blood Culture results may not be optimal due to an excessive volume of blood received in culture bottles   Culture   Final    NO GROWTH 5 DAYS Performed at [facility], [address] [place], [place] [address]    Report Status 02/11/2019 FINAL  Final  SARS CORONAVIRUS 2 (TAT 6-24 HRS) Nasopharyngeal Nasopharyngeal Swab     Status: Abnormal   Collection Time: 02/06/19  8:17 PM   Specimen: Nasopharyngeal Swab  Result Value Ref Range Status   SARS Coronavirus 2 POSITIVE  (A) NEGATIVE Final    Comment: RESULT CALLED TO, READ BACK BY AND VERIFIED WITH: Gayland Curry RN 11:00 02/07/19 ([email or web address]) (NOTE) SARS-CoV-2 target nucleic acids are DETECTED. The SARS-CoV-2 RNA is generally detectable in upper and  lower respiratory specimens during the acute phase of infection. Positive results are indicative of the presence of SARS-CoV-2 RNA. Clinical correlation with patient history and other diagnostic information is  necessary to determine patient infection status. Positive results do not rule out bacterial infection or co-infection with other viruses.  The expected result is Negative. Fact Sheet for Patients: SugarRoll.be Fact Sheet for Healthcare Providers: https://www.woods-mathews.com/ This test is not yet approved or cleared by the [place] FDA and  has been authorized for detection and/or diagnosis of SARS-CoV-2 by FDA under an Emergency Use Authorization (EUA). This EUA will remain  in effect (meaning this test can be used) for  the duration of the COVID-19 declaration under Section 564(b)(1) of the Act, 21 U.S.C. section 360bbb-3(b)(1), unless the authorization is terminated or revoked sooner. Performed at [facility], [place] [address], [place], [place] [address]   Culture, blood (Routine X 2) w Reflex to ID Panel     Status: None (Preliminary result)   Collection Time: 02/13/19  9:38 AM   Specimen: BLOOD  Result Value Ref Range Status   Specimen Description   Final    BLOOD RIGHT ARM Performed at [facility] [address], [place], [place] [address]    Special Requests   Final    BOTTLES DRAWN AEROBIC AND ANAEROBIC Blood Culture adequate volume Performed at [facility] [address] [place], [place] [address]    Culture   Final    NO GROWTH < 24 HOURS Performed at [facility] [address], [place], [place] [address]    Report Status PENDING   Incomplete  Culture, blood (Routine X 2) w Reflex to ID Panel     Status: None (Preliminary result)   Collection Time: 02/13/19  9:52 AM   Specimen: BLOOD  Result Value Ref Range Status   Specimen Description   Final    BLOOD RIGHT ARM Performed at [facility] [address], [place], [place] [address]    Special Requests   Final    BOTTLES DRAWN AEROBIC AND ANAEROBIC Blood Culture adequate volume Performed at [facility] [address], [place], [place] [address]    Culture   Final    NO GROWTH < 24 HOURS Performed at [facility] [address], [place],  [address]    Report Status PENDING  Incomplete     Medications:   . vitamin C  500 mg Oral Daily  . aspirin EC  81 mg Oral Daily  . atorvastatin  10 mg Oral Daily  . chlorpheniramine-HYDROcodone  5 mL Oral Q12H  . dexamethasone  4 mg Oral Daily  . docusate sodium  100 mg Oral Daily  . enoxaparin (LOVENOX) injection  0.5 mg/kg Subcutaneous Q12H  . fluticasone  2 puff Inhalation BID  . Ipratropium-Albuterol  1 puff Inhalation Q6H  . metoprolol tartrate  100 mg Oral BID  . pantoprazole  40 mg Oral Daily  . terazosin  1 mg Oral QHS  . zinc sulfate  220 mg Oral Daily   Continuous Infusions: . azithromycin 500 mg (02/14/19 1014)  . cefTRIAXone (ROCEPHIN)  IV 2 g (02/14/19 0929)  . vancomycin 1,750 mg (02/14/19 1034)      LOS: 5 days   Charlynne Cousins  [facility]  02/14/2019, 11:31 AM

## 2019-02-14 NOTE — Progress Notes (Addendum)
Pt c/o hemoptysis which is old and blood is not bright red in color. Olevia Bowens, MD came to bedside. Pt not in any distress. Pt now on NRB, no HFNC. No CP, HR WNL. Will continue to monitor to see if pt starts to get in distress or VSS became unstable.

## 2019-02-14 NOTE — Progress Notes (Signed)
[name] updated

## 2019-02-14 NOTE — Discharge Summary (Signed)
Physician Discharge Summary  [name] MCN:[id number] DOB: 08/25/1945 DOA: 02/04/2019  PCP: Glean Hess, MD  Admit date: 02/04/2019 Discharge date: 01/26/2019  Admitted From: Home Disposition:  Transfer to [place]  Recommendations for Outpatient Follow-up:  Per [place] on discharge  Home Health: n/a  Equipment/Devices: n/a   Discharge Condition: stable  CODE STATUS: full Diet recommendation: Heart healthy   Brief/Interim Summary:   73 y.o.malewith medical history significant ofsquamous cell carcinoma of the lung status post treatment since 2018, BPH, prediabetic, morbid obesity, vitamin D deficiency, vitamin B-12 deficiency, hypertension, GERD, coronary artery disease among other things who came to the ER with feverfor1 to 2 days. Denied any sick contactsor knownCOVID-19exposure.Reported having sinus issues last week that have resolved, no new or worsening symptoms aside from fever. In the ED, temp 103 F, HR 93, otherwise stable vitals. CXR and UA normal,rapidCovid-19screennegative. No leukocytosis. Patient was admitted for fever of unknown origin and started on Zosyn empirically.Patient continued with fevers after 24 hours antibiotics, Zosyn discontinued. Flu A/B negative. Further evaluation of fever including CT chest/abdomen/pelvis on 12/23 showed lungs with ground glass opacities, suspicious for Covid-19 pneumonia. Placed on isolation precautions and started remdesivir empirically. Re-sent COVID-19 PCR test which returned positive 12/24. Not initially hypoxic but began needing supplemental oxygen overnight 12/23-24.Remains on 3 L/min oxygen AM of 12/25. Overnight desatted to 80's on 3L/min and this AM is requiring 12 L/min HFNC with sats 92-94%.  Transfer to [facility] initiated.  Actemra given.  Transfer to [facility].    Discharge Diagnoses: Principal Problem:   Pneumonia due to COVID-19 virus Active Problems:   Acute respiratory failure with hypoxia (HCC)    Fever   Coronary artery disease of native artery of native heart with stable angina pectoris (HCC)   Benign essential HTN   Asthma   Obesity (BMI 30-39.9)   BPH (benign prostatic hyperplasia)   Mixed hyperlipidemia   GERD (gastroesophageal reflux disease)    Discharge Instructions    Allergies as of 02/11/2019   No Known Allergies     Medication List    ASK your doctor about these medications   acetaminophen 500 MG tablet Commonly known as: TYLENOL Take 1,000 mg by mouth every 4 (four) hours as needed.   albuterol 108 (90 Base) MCG/ACT inhaler Commonly known as: VENTOLIN HFA Inhale 2 puffs into the lungs every 6 (six) hours as needed for wheezing or shortness of breath.   aspirin EC 81 MG tablet Take 81 mg by mouth daily.   atorvastatin 10 MG tablet Commonly known as: LIPITOR Take 1 tablet (10 mg total) by mouth daily.   fluticasone 44 MCG/ACT inhaler Commonly known as: Flovent HFA Inhale 2 puffs into the lungs 2 (two) times daily.   fluticasone 50 MCG/ACT nasal spray Commonly known as: FLONASE Place 2 sprays into both nostrils daily.   lidocaine-prilocaine cream Commonly known as: EMLA Apply 1 application topically as needed.   mupirocin ointment 2 % Commonly known as: BACTROBAN Apply 1 application topically 2 (two) times daily as needed.   omeprazole 20 MG capsule Commonly known as: PRILOSEC Take 1 capsule by mouth once daily   terazosin 1 MG capsule Commonly known as: HYTRIN Take 1 capsule (1 mg total) by mouth at bedtime.   vitamin B-12 500 MCG tablet Commonly known as: CYANOCOBALAMIN Take 500 mcg by mouth daily.   Vitamin D3 50 MCG (2000 UT) capsule Take 1 capsule (2,000 Units total) by mouth daily.       No Known  Allergies  Consultations:  none   Procedures/Studies: DG Chest 2 View  Result Date: 02/04/2019 CLINICAL DATA:  73 year old male with fever and cough. EXAM: CHEST - 2 VIEW COMPARISON:  Chest radiograph dated 10/20/2017.  FINDINGS: Right-sided Port-A-Cath in similar position. There is an area of density in the left upper lobe similar to prior radiograph, likely scarring. No consolidative changes. There is no pleural effusion or pneumothorax. Stable cardiac silhouette. No acute osseous pathology. IMPRESSION: 1. No acute cardiopulmonary process. 2. Stable left upper lobe scarring. Electronically Signed   By: Anner Crete M.D.   On: 02/04/2019 18:47   CT CHEST W CONTRAST  Result Date: 02/06/2019 CLINICAL DATA:  Squamous cell carcinoma along. Evaluate treatment response. Fever and cough. EXAM: CT CHEST, ABDOMEN, AND PELVIS WITH CONTRAST TECHNIQUE: Multidetector CT imaging of the chest, abdomen and pelvis was performed following the standard protocol during bolus administration of intravenous contrast. CONTRAST:  155mL OMNIPAQUE IOHEXOL 300 MG/ML  SOLN COMPARISON:  CT 12/25/2018 FINDINGS: CT CHEST FINDINGS Cardiovascular: Port in the anterior chest wall with tip in distal SVC. Coronary artery calcification and aortic atherosclerotic calcification. Mediastinum/Nodes: No axillary supraclavicular adenopathy. No mediastinal hilar adenopathy. No pericardial effusion. Esophagus normal. Lungs/Pleura: Band of angular consolidation with air bronchograms in LEFT upper lobe unchanged from prior consistent with radiation treatment effect. There is increased ground-glass opacities superimposed on centrilobular emphysema in the RIGHT upper lobe and posterior aspect of the LEFT lower lobe. No suspicious nodularity. No focal consolidation. There is diffuse ground-glass opacities in the lower lobes additionally. Musculoskeletal: Low-density lesion in the RIGHT hepatic lobe consistent benign cysts. Normal gallbladder. CT ABDOMEN AND PELVIS FINDINGS Hepatobiliary: No focal hepatic lesion. No biliary ductal dilatation. Gallbladder is normal. Common bile duct is normal. Pancreas: Pancreas is normal. No ductal dilatation. No pancreatic inflammation.  Spleen: Normal spleen Adrenals/urinary tract: Adrenal glands normal. Low-density lesions in the kidneys likely represent benign cysts. Ureters and bladder normal. Stomach/Bowel: Stomach, small bowel, appendix, and cecum are normal. The colon and rectosigmoid colon are normal. Vascular/Lymphatic: Abdominal aorta is normal caliber with atherosclerotic calcification. There is no retroperitoneal or periportal lymphadenopathy. No pelvic lymphadenopathy. Reproductive: Prostate normal Other: No peritoneal metastasis Musculoskeletal: No aggressive osseous lesion. IMPRESSION: Chest Impression: 1. New bilateral ground-glass opacities within LEFT and RIGHT lung are nonspecific with broad differential including pulmonary edema, drug reaction, atypical infection or inflammation. 2. Angular consolidation in the LEFT upper lobe consistent radiation treatment. 3. No evidence of lung cancer recurrence. Abdomen / Pelvis Impression: 1. No evidence of metastatic disease in the abdomen pelvis. 2. Benign appearing cysts of the kidneys and liver. 3.  Aortic Atherosclerosis (ICD10-I70.0). Electronically Signed   By: Suzy Bouchard M.D.   On: 02/06/2019 17:41   CT ANGIO CHEST PE W OR WO CONTRAST  Result Date: 02/13/2019 CLINICAL DATA:  Dyspnea on exertion EXAM: CT ANGIOGRAPHY CHEST WITH CONTRAST TECHNIQUE: Multidetector CT imaging of the chest was performed using the standard protocol during bolus administration of intravenous contrast. Multiplanar CT image reconstructions and MIPs were obtained to evaluate the vascular anatomy. CONTRAST:  20mL OMNIPAQUE IOHEXOL 350 MG/ML SOLN COMPARISON:  Seven days ago FINDINGS: Cardiovascular: Normal heart size. No pericardial effusion. There is aortic and coronary atherosclerotic calcification. No pulmonary artery filling defects. Mediastinum/Nodes: Mild upper pneumomediastinum that is new. Lungs/Pleura: Patchy ground-glass opacity in the bilateral lungs in keeping with history of COVID-19. The  opacity is asymmetrically dense at the medial left base. There is left perihilar radiation fibrosis without worrisome masslike characteristics. No effusion  or edema. Upper Abdomen: Renal and hepatic cystic densities. Musculoskeletal: Spondylosis. These results will be called to the ordering clinician or representative by the Radiologist Assistant, and communication documented in the PACS or zVision Dashboard. Review of the MIP images confirms the above findings. IMPRESSION: 1. COVID pneumonia pattern with progression from study 7 days ago. There is asymmetric dense segmental involvement in the left lower lobe, question superimposed bacterial pneumonia. 2. Mild pneumomediastinum that is new from prior. 3. Aortic Atherosclerosis (ICD10-I70.0) and Emphysema (ICD10-J43.9). 4. Left perihilar radiation fibrosis without adverse feature. 5. Negative for pulmonary embolism. Electronically Signed   By: Monte Fantasia M.D.   On: 02/13/2019 08:39   CT ABDOMEN PELVIS W CONTRAST  Result Date: 02/06/2019 CLINICAL DATA:  Squamous cell carcinoma along. Evaluate treatment response. Fever and cough. EXAM: CT CHEST, ABDOMEN, AND PELVIS WITH CONTRAST TECHNIQUE: Multidetector CT imaging of the chest, abdomen and pelvis was performed following the standard protocol during bolus administration of intravenous contrast. CONTRAST:  177mL OMNIPAQUE IOHEXOL 300 MG/ML  SOLN COMPARISON:  CT 12/25/2018 FINDINGS: CT CHEST FINDINGS Cardiovascular: Port in the anterior chest wall with tip in distal SVC. Coronary artery calcification and aortic atherosclerotic calcification. Mediastinum/Nodes: No axillary supraclavicular adenopathy. No mediastinal hilar adenopathy. No pericardial effusion. Esophagus normal. Lungs/Pleura: Band of angular consolidation with air bronchograms in LEFT upper lobe unchanged from prior consistent with radiation treatment effect. There is increased ground-glass opacities superimposed on centrilobular emphysema in the  RIGHT upper lobe and posterior aspect of the LEFT lower lobe. No suspicious nodularity. No focal consolidation. There is diffuse ground-glass opacities in the lower lobes additionally. Musculoskeletal: Low-density lesion in the RIGHT hepatic lobe consistent benign cysts. Normal gallbladder. CT ABDOMEN AND PELVIS FINDINGS Hepatobiliary: No focal hepatic lesion. No biliary ductal dilatation. Gallbladder is normal. Common bile duct is normal. Pancreas: Pancreas is normal. No ductal dilatation. No pancreatic inflammation. Spleen: Normal spleen Adrenals/urinary tract: Adrenal glands normal. Low-density lesions in the kidneys likely represent benign cysts. Ureters and bladder normal. Stomach/Bowel: Stomach, small bowel, appendix, and cecum are normal. The colon and rectosigmoid colon are normal. Vascular/Lymphatic: Abdominal aorta is normal caliber with atherosclerotic calcification. There is no retroperitoneal or periportal lymphadenopathy. No pelvic lymphadenopathy. Reproductive: Prostate normal Other: No peritoneal metastasis Musculoskeletal: No aggressive osseous lesion. IMPRESSION: Chest Impression: 1. New bilateral ground-glass opacities within LEFT and RIGHT lung are nonspecific with broad differential including pulmonary edema, drug reaction, atypical infection or inflammation. 2. Angular consolidation in the LEFT upper lobe consistent radiation treatment. 3. No evidence of lung cancer recurrence. Abdomen / Pelvis Impression: 1. No evidence of metastatic disease in the abdomen pelvis. 2. Benign appearing cysts of the kidneys and liver. 3.  Aortic Atherosclerosis (ICD10-I70.0). Electronically Signed   By: Suzy Bouchard M.D.   On: 02/06/2019 17:41   DG CHEST PORT 1 VIEW  Addendum Date: 02/14/2019   ADDENDUM REPORT: 02/14/2019 09:03 ADDENDUM: Called by the nurse that the patient is not intubated. The tube over the patient's neck is thus presumably artifact/external. Electronically Signed   By: Monte Fantasia  M.D.   On: 02/14/2019 09:03   Result Date: 02/14/2019 CLINICAL DATA:  Dyspnea EXAM: PORTABLE CHEST 1 VIEW COMPARISON:  02/04/2019 FINDINGS: Pneumomediastinum which has likely progressed from chest CT yesterday. Endotracheal tube with tip above the clavicular heads by 2 cm and above the carina at an estimated 13 cm . Right-sided port with chronic looping above the SVC. Pulmonary infiltrates with asymmetric density on the left, also seen previously. Left perihilar fibrosis. IMPRESSION:  1. High endotracheal tube, tip above the clavicular heads. Suggest advancement by ~3 cm. 2. Pneumomediastinum which is likely progressed from chest CT yesterday. 3. Stable airspace disease. Electronically Signed: By: Monte Fantasia M.D. On: 02/14/2019 08:52   Leg [place] [facility]  Result Date: 02/10/2019  Lower Venous Study Indications: Covid-19, elevated D-Dimer.  Comparison Study: No prior study on file for comparison Performing Technologist: Sharion Dove RVS  Examination Guidelines: A complete evaluation includes B-mode imaging, spectral Doppler, color Doppler, and power Doppler as needed of all accessible portions of each vessel. Bilateral testing is considered an integral part of a complete examination. Limited examinations for reoccurring indications may be performed as noted.  +---------+---------------+---------+-----------+----------+--------------+ RIGHT    CompressibilityPhasicitySpontaneityPropertiesThrombus Aging +---------+---------------+---------+-----------+----------+--------------+ CFV      Full           Yes      Yes                                 +---------+---------------+---------+-----------+----------+--------------+ SFJ      Full                                                        +---------+---------------+---------+-----------+----------+--------------+ FV Prox  Full                                                         +---------+---------------+---------+-----------+----------+--------------+ FV Mid   Full                                                        +---------+---------------+---------+-----------+----------+--------------+ FV DistalFull                                                        +---------+---------------+---------+-----------+----------+--------------+ PFV      Full                                                        +---------+---------------+---------+-----------+----------+--------------+ POP      Full           Yes      Yes                                 +---------+---------------+---------+-----------+----------+--------------+ PTV      Full                                                        +---------+---------------+---------+-----------+----------+--------------+  PERO     Full                                                        +---------+---------------+---------+-----------+----------+--------------+   +---------+---------------+---------+-----------+----------+--------------+ LEFT     CompressibilityPhasicitySpontaneityPropertiesThrombus Aging +---------+---------------+---------+-----------+----------+--------------+ CFV      Full           Yes      Yes                                 +---------+---------------+---------+-----------+----------+--------------+ SFJ      Full                                                        +---------+---------------+---------+-----------+----------+--------------+ FV Prox  Full                                                        +---------+---------------+---------+-----------+----------+--------------+ FV Mid   Full                                                        +---------+---------------+---------+-----------+----------+--------------+ FV DistalFull                                                         +---------+---------------+---------+-----------+----------+--------------+ PFV      Full                                                        +---------+---------------+---------+-----------+----------+--------------+ POP      Full           Yes      Yes                                 +---------+---------------+---------+-----------+----------+--------------+ PTV      Full                                                        +---------+---------------+---------+-----------+----------+--------------+ PERO     Full                                                        +---------+---------------+---------+-----------+----------+--------------+  Summary: Right: There is no evidence of deep vein thrombosis in the lower extremity. Left: There is no evidence of deep vein thrombosis in the lower extremity.  *See table(s) above for measurements and observations. Electronically signed by Monica Martinez MD on 02/10/2019 at 1:47:34 PM.    Final        Subjective: patient reports feeling better today despite increased oxygen requirement.  Says breathing better.  NO fever or chills, N/V/D or other complaints.  He agrees with transfer to [facility].  Discussed Actemra, denies history of TB or liver disease.  Lung cancer was diagnosed 2018, completed therapy in summer 2019.   Discharge Exam: Vitals:   02/11/2019 1852 02/11/2019 1855  BP: (!) 156/71 (!) 156/71  Pulse: 93 93  Resp: 19 19  Temp: 98.9 F (37.2 C) 98.9 F (37.2 C)  SpO2: 95% 95%   Vitals:   01/17/2019 1552 01/26/2019 1821 01/30/2019 1852 01/24/2019 1855  BP: (!) 157/71  (!) 156/71 (!) 156/71  Pulse: (!) 51  93 93  Resp: 20  19 19   Temp:   98.9 F (37.2 C) 98.9 F (37.2 C)  TempSrc:      SpO2: 96% 93% 95% 95%  Weight:      Height:        General: Pt is alert, awake, not in acute distress Cardiovascular: RRR, S1/S2 +, no rubs, no gallops Respiratory: crackles bilaterally, no wheezing, no rhonchi Abdominal: Soft,  NT, ND, bowel sounds + Extremities: no edema, no cyanosis    The results of significant diagnostics from this hospitalization (including imaging, microbiology, ancillary and laboratory) are listed below for reference.     Microbiology: Recent Results (from the past 240 hour(s))  Blood culture (routine x 2)     Status: None   Collection Time: 02/04/19 10:41 PM   Specimen: BLOOD  Result Value Ref Range Status   Specimen Description BLOOD RIGHT ANTECUBITAL  Final   Special Requests   Final    BOTTLES DRAWN AEROBIC AND ANAEROBIC Blood Culture results may not be optimal due to an excessive volume of blood received in culture bottles   Culture   Final    NO GROWTH 5 DAYS Performed at [facility] [address], [place], [place] [address]    Report Status 01/28/2019 FINAL  Final  Blood culture (routine x 2)     Status: None   Collection Time: 02/04/19 11:12 PM   Specimen: BLOOD  Result Value Ref Range Status   Specimen Description BLOOD BLOOD RIGHT HAND  Final   Special Requests   Final    BOTTLES DRAWN AEROBIC AND ANAEROBIC Blood Culture adequate volume   Culture   Final    NO GROWTH 5 DAYS Performed at [facility], [address] [place], [place] [address]    Report Status 02/03/2019 FINAL  Final  CULTURE, BLOOD (ROUTINE X 2) w Reflex to ID Panel     Status: None   Collection Time: 02/06/19  8:36 AM   Specimen: BLOOD  Result Value Ref Range Status   Specimen Description BLOOD BLOOD LEFT HAND  Final   Special Requests   Final    BOTTLES DRAWN AEROBIC AND ANAEROBIC Blood Culture results may not be optimal due to an excessive volume of blood received in culture bottles   Culture   Final    NO GROWTH 5 DAYS Performed at [facility], [address] [place], [place] [address]    Report Status 02/11/2019 FINAL  Final  CULTURE, BLOOD (ROUTINE X 2) w Reflex to ID Panel     Status: None   Collection Time: 02/06/19  8:43 AM   Specimen: BLOOD   Result Value Ref Range Status   Specimen Description BLOOD BLOOD RIGHT HAND  Final   Special Requests   Final    BOTTLES DRAWN AEROBIC AND ANAEROBIC Blood Culture results may not be optimal due to an excessive volume of blood received in culture bottles   Culture   Final    NO GROWTH 5 DAYS Performed at [facility], [address] [place], [place] [address]    Report Status 02/11/2019 FINAL  Final  SARS CORONAVIRUS 2 (TAT 6-24 HRS) Nasopharyngeal Nasopharyngeal Swab     Status: Abnormal   Collection Time: 02/06/19  8:17 PM   Specimen: Nasopharyngeal Swab  Result Value Ref Range Status   SARS Coronavirus 2 POSITIVE (A) NEGATIVE Final    Comment: RESULT CALLED TO, READ BACK BY AND VERIFIED WITH: Gayland Curry RN 11:00 02/07/19 ([email or web address]) (NOTE) SARS-CoV-2 target nucleic acids are DETECTED. The SARS-CoV-2 RNA is generally detectable in upper and lower respiratory specimens during the acute phase of infection. Positive results are indicative of the presence of SARS-CoV-2 RNA. Clinical correlation with patient history and other diagnostic information is  necessary to determine patient infection status. Positive results do not rule out bacterial infection or co-infection with other viruses.  The expected result is Negative. Fact Sheet for Patients: SugarRoll.be Fact Sheet for Healthcare Providers: https://www.woods-mathews.com/ This test is not yet approved or cleared by the [place] FDA and  has been authorized for detection and/or diagnosis of SARS-CoV-2 by FDA under an Emergency Use Authorization (EUA). This EUA will remain  in effect (meaning this test can be used) for  the duration of the COVID-19 declaration under Section 564(b)(1) of the Act, 21 U.S.C. section 360bbb-3(b)(1), unless the authorization is terminated or revoked sooner. Performed at [facility], [place] [address] [place], [place] [address]   Culture,  blood (Routine X 2) w Reflex to ID Panel     Status: None (Preliminary result)   Collection Time: 02/13/19  9:38 AM   Specimen: BLOOD  Result Value Ref Range Status   Specimen Description   Final    BLOOD RIGHT ARM Performed at [facility] [address] [place], [place] [address]    Special Requests   Final    BOTTLES DRAWN AEROBIC AND ANAEROBIC Blood Culture adequate volume Performed at [facility] [address] [place], [place] [address]    Culture   Final    NO GROWTH 1 DAY Performed at [facility], [place] [address], [place], [place] [address]    Report Status PENDING  Incomplete  Culture, blood (Routine X 2) w Reflex to ID Panel     Status: None (Preliminary result)   Collection Time: 02/13/19  9:52 AM   Specimen: BLOOD  Result Value Ref Range Status   Specimen Description   Final    BLOOD RIGHT ARM Performed at [facility] [address] [place], [place] [address]    Special Requests   Final    BOTTLES DRAWN AEROBIC AND ANAEROBIC Blood Culture adequate volume Performed at [facility] [address] [place], [place] [address]    Culture   Final    NO GROWTH 1 DAY Performed at [facility], [place] [address] [place], [place] [address]  Report Status PENDING  Incomplete  MRSA PCR Screening     Status: None   Collection Time: 02/14/19  5:00 PM   Specimen: Nasal Mucosa; Nasopharyngeal  Result Value Ref Range Status   MRSA by PCR NEGATIVE NEGATIVE Final    Comment:        The GeneXpert MRSA Assay (FDA approved for NASAL specimens only), is one component of a comprehensive MRSA colonization surveillance program. It is not intended to diagnose MRSA infection nor to guide or monitor treatment for MRSA infections. Performed at [facility], [place] [address], [place], [place] [address]      Labs: BNP (last 3 results) Recent Labs    02/12/19 0328  02/13/19 0420 02/14/19 0135  BNP 62.2 43.0 81.2   Basic Metabolic Panel: Recent Labs  Lab 02/08/2019 0420 02/10/19 0157 02/11/19 0407 02/12/19 0328 02/13/19 0420 02/14/19 0135  NA 142 139 141 142 140 138  K 4.0 3.9 3.7 3.4* 4.7 4.4  CL 109 101 105 102 105 99  CO2 23 22 24 24 24 23   GLUCOSE 120* 110* 134* 111* 124* 75  BUN 20 22 29* 35* 37* 34*  CREATININE 0.95 0.88 1.12 1.08 1.08 1.19  CALCIUM 8.6* 8.2* 8.7* 8.8* 8.6* 8.4*  MG 2.3  --  2.4 2.5* 2.5* 2.6*   Liver Function Tests: Recent Labs  Lab 02/10/19 0157 02/11/19 0407 02/12/19 0328 02/13/19 0420 02/14/19 0135  AST 65* 61* 54* 47* 41  ALT 58* 80* 90* 89* 79*  ALKPHOS 52 74 81 82 94  BILITOT 1.0 0.7 1.1 1.3* 0.9  PROT 7.5 7.7 7.5 7.4 6.7  ALBUMIN 3.3* 3.5 3.6 3.4* 3.2*   No results for input(s): LIPASE, AMYLASE in the last 168 hours. No results for input(s): AMMONIA in the last 168 hours. CBC: Recent Labs  Lab 02/10/19 0157 02/11/19 0407 02/12/19 0328 02/13/19 0420 02/14/19 0135  WBC 6.7 12.7* 15.1* 11.6* 16.0*  NEUTROABS 5.8 11.5* 13.6* 10.4* 14.9*  HGB 13.5 14.3 14.8 14.4 14.1  HCT 42.0 45.1 45.9 45.0 44.1  MCV 87.0 87.2 88.3 87.4 87.7  PLT 212 293 346 322 289   Cardiac Enzymes: No results for input(s): CKTOTAL, CKMB, CKMBINDEX, TROPONINI in the last 168 hours. BNP: Invalid input(s): POCBNP CBG: Recent Labs  Lab 02/12/19 1713 02/12/19 1938 02/13/19 0715 02/13/19 1119 02/13/19 1701  GLUCAP 119* 120* 108* 118* 156*   D-Dimer Recent Labs    02/13/19 0420 02/14/19 0135  DDIMER 3.97* 7.73*   Hgb A1c No results for input(s): HGBA1C in the last 72 hours. Lipid Profile No results for input(s): CHOL, HDL, LDLCALC, TRIG, CHOLHDL, LDLDIRECT in the last 72 hours. Thyroid function studies No results for input(s): TSH, T4TOTAL, T3FREE, THYROIDAB in the last 72 hours.  Invalid input(s): FREET3 Anemia work up No results for input(s): VITAMINB12, FOLATE, FERRITIN, TIBC, IRON, RETICCTPCT in the  last 72 hours. Urinalysis    Component Value Date/Time   COLORURINE YELLOW (A) 02/04/2019 2232   APPEARANCEUR HAZY (A) 02/04/2019 2232   LABSPEC 1.020 02/04/2019 2232   PHURINE 5.0 02/04/2019 2232   GLUCOSEU NEGATIVE 02/04/2019 2232   HGBUR NEGATIVE 02/04/2019 2232   BILIRUBINUR NEGATIVE 02/04/2019 2232   KETONESUR NEGATIVE 02/04/2019 2232   PROTEINUR 30 (A) 02/04/2019 2232   NITRITE NEGATIVE 02/04/2019 2232   LEUKOCYTESUR NEGATIVE 02/04/2019 2232   Sepsis Labs Invalid input(s): PROCALCITONIN,  WBC,  LACTICIDVEN Microbiology Recent Results (from the past 240 hour(s))  Blood culture (routine x 2)  Status: None   Collection Time: 02/04/19 10:41 PM   Specimen: BLOOD  Result Value Ref Range Status   Specimen Description BLOOD RIGHT ANTECUBITAL  Final   Special Requests   Final    BOTTLES DRAWN AEROBIC AND ANAEROBIC Blood Culture results may not be optimal due to an excessive volume of blood received in culture bottles   Culture   Final    NO GROWTH 5 DAYS Performed at [facility], [address] [place], [place] [address]    Report Status 02/05/2019 FINAL  Final  Blood culture (routine x 2)     Status: None   Collection Time: 02/04/19 11:12 PM   Specimen: BLOOD  Result Value Ref Range Status   Specimen Description BLOOD BLOOD RIGHT HAND  Final   Special Requests   Final    BOTTLES DRAWN AEROBIC AND ANAEROBIC Blood Culture adequate volume   Culture   Final    NO GROWTH 5 DAYS Performed at [facility], [address] [place], [place] [address]    Report Status 01/27/2019 FINAL  Final  CULTURE, BLOOD (ROUTINE X 2) w Reflex to ID Panel     Status: None   Collection Time: 02/06/19  8:36 AM   Specimen: BLOOD  Result Value Ref Range Status   Specimen Description BLOOD BLOOD LEFT HAND  Final   Special Requests   Final    BOTTLES DRAWN AEROBIC AND ANAEROBIC Blood Culture results may not be optimal due to an excessive volume of blood received in culture  bottles   Culture   Final    NO GROWTH 5 DAYS Performed at [facility], [address] [place], [place] [address]    Report Status 02/11/2019 FINAL  Final  CULTURE, BLOOD (ROUTINE X 2) w Reflex to ID Panel     Status: None   Collection Time: 02/06/19  8:43 AM   Specimen: BLOOD  Result Value Ref Range Status   Specimen Description BLOOD BLOOD RIGHT HAND  Final   Special Requests   Final    BOTTLES DRAWN AEROBIC AND ANAEROBIC Blood Culture results may not be optimal due to an excessive volume of blood received in culture bottles   Culture   Final    NO GROWTH 5 DAYS Performed at [facility], [address] [place], [place] [address]    Report Status 02/11/2019 FINAL  Final  SARS CORONAVIRUS 2 (TAT 6-24 HRS) Nasopharyngeal Nasopharyngeal Swab     Status: Abnormal   Collection Time: 02/06/19  8:17 PM   Specimen: Nasopharyngeal Swab  Result Value Ref Range Status   SARS Coronavirus 2 POSITIVE (A) NEGATIVE Final    Comment: RESULT CALLED TO, READ BACK BY AND VERIFIED WITH: Gayland Curry RN 11:00 02/07/19 ([email or web address]) (NOTE) SARS-CoV-2 target nucleic acids are DETECTED. The SARS-CoV-2 RNA is generally detectable in upper and lower respiratory specimens during the acute phase of infection. Positive results are indicative of the presence of SARS-CoV-2 RNA. Clinical correlation with patient history and other diagnostic information is  necessary to determine patient infection status. Positive results do not rule out bacterial infection or co-infection with other viruses.  The expected result is Negative. Fact Sheet for Patients: SugarRoll.be Fact Sheet for Healthcare Providers: https://www.woods-mathews.com/ This test is not yet approved or cleared by the [place] FDA and  has been authorized for detection and/or diagnosis of SARS-CoV-2 by FDA under an Emergency Use Authorization (EUA). This EUA will remain  in effect  (meaning this test  can be used) for  the duration of the COVID-19 declaration under Section 564(b)(1) of the Act, 21 U.S.C. section 360bbb-3(b)(1), unless the authorization is terminated or revoked sooner. Performed at [facility], [place] [address], [place], [place] [address]   Culture, blood (Routine X 2) w Reflex to ID Panel     Status: None (Preliminary result)   Collection Time: 02/13/19  9:38 AM   Specimen: BLOOD  Result Value Ref Range Status   Specimen Description   Final    BLOOD RIGHT ARM Performed at [place] [address] [place], [place] [address]    Special Requests   Final    BOTTLES DRAWN AEROBIC AND ANAEROBIC Blood Culture adequate volume Performed at [facility] [place] [address] [place], [place] [address]    Culture   Final    NO GROWTH 1 DAY Performed at [facility], [place] [address], [place], [place] [address]    Report Status PENDING  Incomplete  Culture, blood (Routine X 2) w Reflex to ID Panel     Status: None (Preliminary result)   Collection Time: 02/13/19  9:52 AM   Specimen: BLOOD  Result Value Ref Range Status   Specimen Description   Final    BLOOD RIGHT ARM Performed at [facility] [address], [place], [place] [address]    Special Requests   Final    BOTTLES DRAWN AEROBIC AND ANAEROBIC Blood Culture adequate volume Performed at [place] [facility] [address], [place], [place] [address]    Culture   Final    NO GROWTH 1 DAY Performed at [facility], [place] [address] [place], [place] [address]    Report Status PENDING  Incomplete  MRSA PCR Screening     Status: None   Collection Time: 02/14/19  5:00 PM   Specimen: Nasal Mucosa; Nasopharyngeal  Result Value Ref Range Status   MRSA by PCR NEGATIVE NEGATIVE Final    Comment:        The GeneXpert MRSA Assay (FDA approved for NASAL specimens only), is one component of a comprehensive MRSA  colonization surveillance program. It is not intended to diagnose MRSA infection nor to guide or monitor treatment for MRSA infections. Performed at [facility], [place] [address] [place], [place] [address]      Time coordinating discharge: Over 30 minutes  SIGNED:   Ezekiel Slocumb, DO [facility] 02/14/2019, 7:53 PM   If 7PM-7AM, please contact night-coverage [email or web address] Password [email or web address]

## 2019-02-15 ENCOUNTER — Inpatient Hospital Stay (HOSPITAL_COMMUNITY): Payer: Medicare HMO

## 2019-02-15 DIAGNOSIS — J1282 Pneumonia due to Coronavirus disease 2019: Secondary | ICD-10-CM

## 2019-02-15 LAB — PROCALCITONIN: Procalcitonin: 0.5 ng/mL

## 2019-02-15 NOTE — Progress Notes (Signed)
Called in room d/t pt c/o not being able to breathe. RT and MD notified that pt on 15L HFNC and 15L NRB. HR in low 100's-one teens, pt tachypnic, O2 sats WNL at this time. Venetia Constable, MD came to bedside and assessed pt. Pt had chest x-ray performed this morning. MD waiting on Radiologist to read image. No new orders at this time. Pt placed prone and states he feels better. Will continue to monitor pt

## 2019-02-15 NOTE — Progress Notes (Signed)
[facility] PROGRESS NOTE    Progress Note  [name]  WER:[id number] DOB: [birth year] DOA: 01/24/2019 PCP: Glean Hess, MD     Brief Narrative:   [name] is an 74 y.o. male past medical history significant for squamous cell carcinoma of the lung status post chemotherapy in 2018, diabetes, morbid obesity, vitamin D deficiency, hypertension presents to [facility] with shortness of breath and acute hypoxic respiratory failure due to COVID-19 pneumonia.  He was given IV steroids, remdesivir and Actemra and transferred to [facility] on 15 L high flow nasal cannula  Assessment/Plan:   Acute hypoxemic respiratory failure due to severe acute respiratory syndrome coronavirus 2 (SARS-CoV-2) disease (HCC) and bacterial PNA: Patient is now on high flow nasal cannula at 15 L saturations have been greater than 91%. He is complete his course of IV remdesivir, will continue steroids for 10 days. Try to keep the patient prone for early 16 hours a day, if not prone out of bed to chair. CT angio of the chest on 02/13/2019 as below. 12-lead EKG show left anterior fascicular block left atrial enlargement and LVH, there is no ST segment elevation. He has remained afebrile with no leukocytosis continue empiric antibiotics, MRSA screening was negative will discontinue IV vancomycin.  New small pneumomediastinum: CT of the chest done on 02/13/2019 showed new pneumomediastinum. Continue Tussionex and Robitussin to try to decrease coughing spells. Chest x-ray was done I have personally reviewed does not appear to have any worsening pneumomediastinum. He denies any new as of breath or pain.  New sinus tachycardia: Continue metoprolol scheduled and IV as needed, he is having intermittent episodes of sinus tachycardia with heart rate as high as 109.  We will continue to monitor likely due to stress.  History of squamous cell carcinoma of the lung: Continue outpatient treatment  with oncologist.  Obesity: Follow-up with PCP with a BMI of 34.  GERD: Continue PPI.  Essential hypertension: Seems to be fairly controlled, mildly tachycardic this morning we will continue to monitor closely.  Elevated D-dimer: CRP and D-dimer rising, his D-dimer is greater than 5 we will put on subtherapeutic Lovenox. CT angio of the chest done on 02/13/2019 was negative for pulmonary embolism, lower extremity Dopplers negative for DVT.  Asymptomatic elevated LFTs: Elevated LFTs likely due to COVID-19.  Hypokalemia: Repleted.  Ethics/palliative care advance goals of care: I spent over 40 minutes speaking with [name] today about his CODE STATUS and after long deliberation and many questions he would like he would like full aggressive treatment, but would not like to be intubated or resuscitated. We did speak about that he has a long hospital stay ahead of him and that we would try to do what is best for him and what he would like.  He was happy and in a good emotional state today after our conversation.   DVT prophylaxis: lovenox Family Communication: Disposition Plan/Barrier to D/C: unable to determine Code Status:DNR   IV Access:    Peripheral IV   Procedures and diagnostic studies:   CT ANGIO CHEST PE W OR WO CONTRAST  Result Date: 02/13/2019 CLINICAL DATA:  Dyspnea on exertion EXAM: CT ANGIOGRAPHY CHEST WITH CONTRAST TECHNIQUE: Multidetector CT imaging of the chest was performed using the standard protocol during bolus administration of intravenous contrast. Multiplanar CT image reconstructions and MIPs were obtained to evaluate the vascular anatomy. CONTRAST:  76mL OMNIPAQUE IOHEXOL 350 MG/ML SOLN COMPARISON:  Seven days ago FINDINGS: Cardiovascular: Normal heart size. No  pericardial effusion. There is aortic and coronary atherosclerotic calcification. No pulmonary artery filling defects. Mediastinum/Nodes: Mild upper pneumomediastinum that is new. Lungs/Pleura: Patchy  ground-glass opacity in the bilateral lungs in keeping with history of COVID-19. The opacity is asymmetrically dense at the medial left base. There is left perihilar radiation fibrosis without worrisome masslike characteristics. No effusion or edema. Upper Abdomen: Renal and hepatic cystic densities. Musculoskeletal: Spondylosis. These results will be called to the ordering clinician or representative by the Radiologist Assistant, and communication documented in the PACS or zVision Dashboard. Review of the MIP images confirms the above findings. IMPRESSION: 1. COVID pneumonia pattern with progression from study 7 days ago. There is asymmetric dense segmental involvement in the left lower lobe, question superimposed bacterial pneumonia. 2. Mild pneumomediastinum that is new from prior. 3. Aortic Atherosclerosis (ICD10-I70.0) and Emphysema (ICD10-J43.9). 4. Left perihilar radiation fibrosis without adverse feature. 5. Negative for pulmonary embolism. Electronically Signed   By: Monte Fantasia M.D.   On: 02/13/2019 08:39   DG CHEST PORT 1 VIEW  Addendum Date: 02/14/2019   ADDENDUM REPORT: 02/14/2019 09:03 ADDENDUM: Called by the nurse that the patient is not intubated. The tube over the patient's neck is thus presumably artifact/external. Electronically Signed   By: Monte Fantasia M.D.   On: 02/14/2019 09:03   Result Date: 02/14/2019 CLINICAL DATA:  Dyspnea EXAM: PORTABLE CHEST 1 VIEW COMPARISON:  02/04/2019 FINDINGS: Pneumomediastinum which has likely progressed from chest CT yesterday. Endotracheal tube with tip above the clavicular heads by 2 cm and above the carina at an estimated 13 cm . Right-sided port with chronic looping above the SVC. Pulmonary infiltrates with asymmetric density on the left, also seen previously. Left perihilar fibrosis. IMPRESSION: 1. High endotracheal tube, tip above the clavicular heads. Suggest advancement by ~3 cm. 2. Pneumomediastinum which is likely progressed from chest CT  yesterday. 3. Stable airspace disease. Electronically Signed: By: Monte Fantasia M.D. On: 02/14/2019 08:52     Medical Consultants:    None.  Anti-Infectives:   IV vancomycin Rocephin and azithromycin  Subjective:    Eddie North he denies any chest pain, he relates little bit short of breath this morning.  When I went in the second time he relates his shortness of breath was significantly improved compared to previous encounter we had in the morning. Objective:    Vitals:   02/15/19 0000 02/15/19 0301 02/15/19 0315 02/15/19 0318  BP: 114/66  129/75   Pulse: 76   97  Resp: 18 17  19   Temp:  98.3 F (36.8 C)    TempSrc:  Oral    SpO2: 100% 90%  91%  Height:       SpO2: 91 % O2 Flow Rate (L/min): 15 L/min FiO2 (%): 100 %   Intake/Output Summary (Last 24 hours) at 02/15/2019 0749 Last data filed at 02/14/2019 2030 Gross per 24 hour  Intake 1200 ml  Output 600 ml  Net 600 ml   There were no vitals filed for this visit.  Exam: General exam: In no acute distress. Respiratory system: Good air movement and diffuse crackles bilaterally. Cardiovascular system: S1 & S2 heard, RRR. No JVD. Gastrointestinal system: Abdomen is nondistended, soft and nontender.  Central nervous system: Alert and oriented. No focal neurological deficits. Extremities: No pedal edema. Skin: No rashes, lesions or ulcers, no crepitus. Psychiatry: Judgement and insight appear normal. Mood & affect appropriate.    Data Reviewed:    Labs: Basic Metabolic Panel: Recent Labs  Lab  02/14/2019 0420 02/10/19 0157 02/11/19 0407 02/12/19 0328 02/13/19 0420 02/14/19 0135  NA 142 139 141 142 140 138  K 4.0 3.9 3.7 3.4* 4.7 4.4  CL 109 101 105 102 105 99  CO2 23 22 24 24 24 23   GLUCOSE 120* 110* 134* 111* 124* 75  BUN 20 22 29* 35* 37* 34*  CREATININE 0.95 0.88 1.12 1.08 1.08 1.19  CALCIUM 8.6* 8.2* 8.7* 8.8* 8.6* 8.4*  MG 2.3  --  2.4 2.5* 2.5* 2.6*   GFR Estimated Creatinine Clearance:  71 mL/min (by C-G formula based on SCr of 1.19 mg/dL). Liver Function Tests: Recent Labs  Lab 02/10/19 0157 02/11/19 0407 02/12/19 0328 02/13/19 0420 02/14/19 0135  AST 65* 61* 54* 47* 41  ALT 58* 80* 90* 89* 79*  ALKPHOS 52 74 81 82 94  BILITOT 1.0 0.7 1.1 1.3* 0.9  PROT 7.5 7.7 7.5 7.4 6.7  ALBUMIN 3.3* 3.5 3.6 3.4* 3.2*   No results for input(s): LIPASE, AMYLASE in the last 168 hours. No results for input(s): AMMONIA in the last 168 hours. Coagulation profile No results for input(s): INR, PROTIME in the last 168 hours. COVID-19 Labs  Recent Labs    02/13/19 0420 02/14/19 0135  DDIMER 3.97* 7.73*  CRP 0.6 2.3*    Lab Results  Component Value Date   SARSCOV2NAA POSITIVE (A) 02/06/2019    CBC: Recent Labs  Lab 02/10/19 0157 02/11/19 0407 02/12/19 0328 02/13/19 0420 02/14/19 0135  WBC 6.7 12.7* 15.1* 11.6* 16.0*  NEUTROABS 5.8 11.5* 13.6* 10.4* 14.9*  HGB 13.5 14.3 14.8 14.4 14.1  HCT 42.0 45.1 45.9 45.0 44.1  MCV 87.0 87.2 88.3 87.4 87.7  PLT 212 293 346 322 289   Cardiac Enzymes: No results for input(s): CKTOTAL, CKMB, CKMBINDEX, TROPONINI in the last 168 hours. BNP (last 3 results) No results for input(s): PROBNP in the last 8760 hours. CBG: Recent Labs  Lab 02/12/19 1713 02/12/19 1938 02/13/19 0715 02/13/19 1119 02/13/19 1701  GLUCAP 119* 120* 108* 118* 156*   D-Dimer: Recent Labs    02/13/19 0420 02/14/19 0135  DDIMER 3.97* 7.73*   Hgb A1c: No results for input(s): HGBA1C in the last 72 hours. Lipid Profile: No results for input(s): CHOL, HDL, LDLCALC, TRIG, CHOLHDL, LDLDIRECT in the last 72 hours. Thyroid function studies: No results for input(s): TSH, T4TOTAL, T3FREE, THYROIDAB in the last 72 hours.  Invalid input(s): FREET3 Anemia work up: No results for input(s): VITAMINB12, FOLATE, FERRITIN, TIBC, IRON, RETICCTPCT in the last 72 hours. Sepsis Labs: Recent Labs  Lab 02/11/19 0407 02/12/19 0328 02/13/19 0420 02/13/19 0938  02/14/19 0135  PROCALCITON <0.10 <0.10  --  <0.10 0.45  WBC 12.7* 15.1* 11.6*  --  16.0*   Microbiology Recent Results (from the past 240 hour(s))  CULTURE, BLOOD (ROUTINE X 2) w Reflex to ID Panel     Status: None   Collection Time: 02/06/19  8:36 AM   Specimen: BLOOD  Result Value Ref Range Status   Specimen Description BLOOD BLOOD LEFT HAND  Final   Special Requests   Final    BOTTLES DRAWN AEROBIC AND ANAEROBIC Blood Culture results may not be optimal due to an excessive volume of blood received in culture bottles   Culture   Final    NO GROWTH 5 DAYS Performed at [facility], [address] [place], [place] [address]    Report Status 02/11/2019 FINAL  Final  CULTURE, BLOOD (ROUTINE X 2) w Reflex to ID Panel  Status: None   Collection Time: 02/06/19  8:43 AM   Specimen: BLOOD  Result Value Ref Range Status   Specimen Description BLOOD BLOOD RIGHT HAND  Final   Special Requests   Final    BOTTLES DRAWN AEROBIC AND ANAEROBIC Blood Culture results may not be optimal due to an excessive volume of blood received in culture bottles   Culture   Final    NO GROWTH 5 DAYS Performed at [facility], [address] [place], [place] [address]    Report Status 02/11/2019 FINAL  Final  SARS CORONAVIRUS 2 (TAT 6-24 HRS) Nasopharyngeal Nasopharyngeal Swab     Status: Abnormal   Collection Time: 02/06/19  8:17 PM   Specimen: Nasopharyngeal Swab  Result Value Ref Range Status   SARS Coronavirus 2 POSITIVE (A) NEGATIVE Final    Comment: RESULT CALLED TO, READ BACK BY AND VERIFIED WITH: Gayland Curry RN 11:00 02/07/19 ([email or web address]) (NOTE) SARS-CoV-2 target nucleic acids are DETECTED. The SARS-CoV-2 RNA is generally detectable in upper and lower respiratory specimens during the acute phase of infection. Positive results are indicative of the presence of SARS-CoV-2 RNA. Clinical correlation with patient history and other diagnostic information is  necessary to determine  patient infection status. Positive results do not rule out bacterial infection or co-infection with other viruses.  The expected result is Negative. Fact Sheet for Patients: SugarRoll.be Fact Sheet for Healthcare Providers: https://www.woods-mathews.com/ This test is not yet approved or cleared by the [place] FDA and  has been authorized for detection and/or diagnosis of SARS-CoV-2 by FDA under an Emergency Use Authorization (EUA). This EUA will remain  in effect (meaning this test can be used) for  the duration of the COVID-19 declaration under Section 564(b)(1) of the Act, 21 U.S.C. section 360bbb-3(b)(1), unless the authorization is terminated or revoked sooner. Performed at [facility], [place] [address] [place], [place] [address]   Culture, blood (Routine X 2) w Reflex to ID Panel     Status: None (Preliminary result)   Collection Time: 02/13/19  9:38 AM   Specimen: BLOOD  Result Value Ref Range Status   Specimen Description   Final    BLOOD RIGHT ARM Performed at [facility] [address], [place], [place] [address]    Special Requests   Final    BOTTLES DRAWN AEROBIC AND ANAEROBIC Blood Culture adequate volume Performed at [facility] [address], [place], [place] [address]    Culture   Final    NO GROWTH 1 DAY Performed at [facility], [place] [address], [place], [place] [address]    Report Status PENDING  Incomplete  Culture, blood (Routine X 2) w Reflex to ID Panel     Status: None (Preliminary result)   Collection Time: 02/13/19  9:52 AM   Specimen: BLOOD  Result Value Ref Range Status   Specimen Description   Final    BLOOD RIGHT ARM Performed at [facility] [address] [place], [place] [address]    Special Requests   Final    BOTTLES DRAWN AEROBIC AND ANAEROBIC Blood Culture adequate volume Performed at [facility] [address], [place], [place] [address]    Culture   Final    NO GROWTH 1 DAY Performed at [facility], [place] [address], [place],  [address]    Report Status PENDING  Incomplete  MRSA PCR Screening     Status:  None   Collection Time: 02/14/19  5:00 PM   Specimen: Nasal Mucosa; Nasopharyngeal  Result Value Ref Range Status   MRSA by PCR NEGATIVE NEGATIVE Final    Comment:        The GeneXpert MRSA Assay (FDA approved for NASAL specimens only), is one component of a comprehensive MRSA colonization surveillance program. It is not intended to diagnose MRSA infection nor to guide or monitor treatment for MRSA infections. Performed at [facility], [place] [address], [place], [place] [address]      Medications:   . vitamin C  500 mg Oral Daily  . aspirin EC  81 mg Oral Daily  . atorvastatin  10 mg Oral Daily  . chlorpheniramine-HYDROcodone  5 mL Oral Q12H  . dexamethasone  4 mg Oral Daily  . docusate sodium  100 mg Oral Daily  . enoxaparin (LOVENOX) injection  0.5 mg/kg Subcutaneous Q12H  . fluticasone  2 puff Inhalation BID  . Ipratropium-Albuterol  1 puff Inhalation Q6H  . metoprolol tartrate  100 mg Oral BID  . pantoprazole  40 mg Oral Daily  . terazosin  1 mg Oral QHS  . zinc sulfate  220 mg Oral Daily   Continuous Infusions: . azithromycin Stopped (02/14/19 1114)  . cefTRIAXone (ROCEPHIN)  IV Stopped (02/14/19 1019)  . vancomycin Stopped (02/14/19 1323)      LOS: 6 days   Charlynne Cousins  [facility]  02/15/2019, 7:49 AM

## 2019-02-15 NOTE — Progress Notes (Signed)
Rapid Response Event Note  Overview: RN called RRRT to assess patient due to patient complaint of increase SOB. ICU CN/RRRN came to assess patient.    Initial Focused Assessment: Patient in prone position when I arrived. Patient reported feeling decreased SOB from supinated positioning. Diminished breath sounds bilaterally, with crackles in the left base. CXR performed this morning still pending. MD to see patient and updated by RRRN.   Interventions: None at this time. Education performed with bedside RN on signs to call rapid response. CCM is aware of this patient.  Plan of Care (if not transferred): If the bedside staff have any clinical questions or any concern that an escalation of care is necessary for this patient please call the ICU CN at [phone]. We are happy to help support you.     Elspeth Cho

## 2019-02-15 NOTE — Progress Notes (Signed)
Izell Price updated

## 2019-02-15 NOTE — Progress Notes (Signed)
Pt remains prone. VSS. Pt able to have O2 demand decreased to 7L HFNC only. NRB within reach but not placed on pt face. Available prn. Pt stated he feels good. Venetia Constable, MD and [facility] nurse notified. Will continue to monitor pt.

## 2019-02-15 DEATH — deceased

## 2019-02-16 ENCOUNTER — Inpatient Hospital Stay (HOSPITAL_COMMUNITY): Payer: Medicare HMO

## 2019-02-16 LAB — HEPATIC FUNCTION PANEL
ALT: 45 U/L — ABNORMAL HIGH (ref 0–44)
AST: 61 U/L — ABNORMAL HIGH (ref 15–41)
Albumin: 3.4 g/dL — ABNORMAL LOW (ref 3.5–5.0)
Alkaline Phosphatase: 199 U/L — ABNORMAL HIGH (ref 38–126)
Bilirubin, Direct: 0.1 mg/dL (ref 0.0–0.2)
Indirect Bilirubin: 0.7 mg/dL (ref 0.3–0.9)
Total Bilirubin: 0.8 mg/dL (ref 0.3–1.2)
Total Protein: 7.1 g/dL (ref 6.5–8.1)

## 2019-02-16 LAB — BASIC METABOLIC PANEL
Anion gap: 14 (ref 5–15)
BUN: 30 mg/dL — ABNORMAL HIGH (ref 8–23)
CO2: 24 mmol/L (ref 22–32)
Calcium: 8.6 mg/dL — ABNORMAL LOW (ref 8.9–10.3)
Chloride: 102 mmol/L (ref 98–111)
Creatinine, Ser: 1.17 mg/dL (ref 0.61–1.24)
GFR calc Af Amer: >60 mL/min (ref >60)
GFR calc non Af Amer: >60 mL/min (ref >60)
Glucose, Bld: 144 mg/dL — ABNORMAL HIGH (ref 70–99)
Potassium: 3.8 mmol/L (ref 3.5–5.1)
Sodium: 140 mmol/L (ref 135–145)

## 2019-02-16 LAB — CBC WITH DIFFERENTIAL/PLATELET
Abs Immature Granulocytes: 0.09 10*3/uL — ABNORMAL HIGH (ref 0.00–0.07)
Basophils Absolute: 0 10*3/uL (ref 0.0–0.1)
Basophils Relative: 0 %
Eosinophils Absolute: 0.1 10*3/uL (ref 0.0–0.5)
Eosinophils Relative: 1 %
HCT: 49.2 % (ref 39.0–52.0)
Hemoglobin: 15.5 g/dL (ref 13.0–17.0)
Immature Granulocytes: 1 %
Lymphocytes Relative: 3 %
Lymphs Abs: 0.4 10*3/uL — ABNORMAL LOW (ref 0.7–4.0)
MCH: 27.7 pg (ref 26.0–34.0)
MCHC: 31.5 g/dL (ref 30.0–36.0)
MCV: 87.9 fL (ref 80.0–100.0)
Monocytes Absolute: 0.2 10*3/uL (ref 0.1–1.0)
Monocytes Relative: 1 %
Neutro Abs: 14 10*3/uL — ABNORMAL HIGH (ref 1.7–7.7)
Neutrophils Relative %: 94 %
Platelets: 166 10*3/uL (ref 150–400)
RBC: 5.6 MIL/uL (ref 4.22–5.81)
RDW: 13.4 % (ref 11.5–15.5)
WBC: 14.8 10*3/uL — ABNORMAL HIGH (ref 4.0–10.5)
nRBC: 0 % (ref 0.0–0.2)

## 2019-02-16 LAB — C-REACTIVE PROTEIN: CRP: 0.7 mg/dL (ref <1.0)

## 2019-02-16 LAB — D-DIMER, QUANTITATIVE: D-Dimer, Quant: >20 ug/mL-FEU — ABNORMAL HIGH (ref 0.00–0.50)

## 2019-02-16 MED ORDER — MORPHINE SULFATE (PF) 2 MG/ML IV SOLN
2.0000 mg | INTRAVENOUS | Status: DC | PRN
  Administered 2019-02-18 – 2019-02-19 (×6): 2 mg via INTRAVENOUS
  Filled 2019-02-16 (×7): qty 1

## 2019-02-16 MED ORDER — ENOXAPARIN SODIUM 120 MG/0.8ML ~~LOC~~ SOLN
1.0000 mg/kg | Freq: Two times a day (BID) | SUBCUTANEOUS | Status: DC
  Administered 2019-02-16 – 2019-02-18 (×4): 110 mg via SUBCUTANEOUS
  Filled 2019-02-16 (×5): qty 0.8

## 2019-02-16 MED ORDER — ENOXAPARIN SODIUM 60 MG/0.6ML ~~LOC~~ SOLN
50.0000 mg | SUBCUTANEOUS | Status: AC
  Administered 2019-02-16: 50 mg via SUBCUTANEOUS
  Filled 2019-02-16: qty 0.6

## 2019-02-16 MED ORDER — MORPHINE SULFATE (PF) 2 MG/ML IV SOLN
2.0000 mg | Freq: Once | INTRAVENOUS | Status: AC
  Administered 2019-02-16: 2 mg via INTRAVENOUS
  Filled 2019-02-16: qty 1

## 2019-02-16 NOTE — Progress Notes (Signed)
ANTICOAGULATION CONSULT NOTE - Follow Up Consult  Pharmacy Consult for Lovenox Indication: VTE treatment  No Known Allergies  Patient Measurements: Height: 5\' 11"  (180.3 cm) IBW/kg (Calculated) : 75.3 Heparin Dosing Weight:   Vital Signs: Temp: 99.2 F (37.3 C) (01/02 1217) Temp Source: Oral (01/02 1217) BP: 132/73 (01/02 1217) Pulse Rate: 83 (01/02 1217)  Labs: Recent Labs    02/14/19 0135 02/16/19 0947  HGB 14.1 15.5  HCT 44.1 49.2  PLT 289 166  CREATININE 1.19 1.17    Estimated Creatinine Clearance: 72.2 mL/min (by C-G formula based on SCr of 1.17 mg/dL).   Medications:  Scheduled:  . vitamin C  500 mg Oral Daily  . aspirin EC  81 mg Oral Daily  . atorvastatin  10 mg Oral Daily  . chlorpheniramine-HYDROcodone  5 mL Oral Q12H  . docusate sodium  100 mg Oral Daily  . enoxaparin (LOVENOX) injection  0.5 mg/kg Subcutaneous Q12H  . fluticasone  2 puff Inhalation BID  . Ipratropium-Albuterol  1 puff Inhalation Q6H  . metoprolol tartrate  100 mg Oral BID  . pantoprazole  40 mg Oral Daily  . terazosin  1 mg Oral QHS  . zinc sulfate  220 mg Oral Daily   Infusions:  . azithromycin 500 mg (02/16/19 1024)  . cefTRIAXone (ROCEPHIN)  IV 2 g (02/16/19 1025)    Assessment: 55 yoM admitted with COVID-19.  PMH significant for lung cancer.  Currently on intermediate dose Lovenox 0.5 mg /kg BID for VTE prophylaxis with BMI 34 and D-dimer increased significantly to > 20.  Pharmacy is now consulted to increase to full treatment dose Lovenox for suspected VTE. Previously: 12/27 Dopplers: negative for DVT 12/30 CTa: negative for PE  SCr 1.17 with CrCl ~ 72 ml/min CBC: Hgb and Plt stable and WNL. Repeat lower extremity Dopplers to rule out DVT  Goal of Therapy:  Anti-Xa level 0.6-1 units/ml 4hrs after LMWH dose given Monitor platelets by anticoagulation protocol: Yes   Plan:  Lovenox 1 mg/kg (110mg ) [place] q12h. Follow up renal function, CBC q72, s/s bleeding,  indication.  Gretta Arab PharmD, BCPS Clinical pharmacist phone 7am- 5pm: [phone] 02/16/2019 12:53 PM

## 2019-02-16 NOTE — Progress Notes (Signed)
Family updated by MD

## 2019-02-16 NOTE — Progress Notes (Addendum)
[facility] PROGRESS NOTE    Progress Note  [name]  OHY:[id number] DOB: [birth year] DOA: 02/07/2019 PCP: Glean Hess, MD     Brief Narrative:   [name] is an 74 y.o. male past medical history significant for squamous cell carcinoma of the lung status post chemotherapy in 2018, diabetes, morbid obesity, vitamin D deficiency, hypertension presents to [facility] with shortness of breath and acute hypoxic respiratory failure due to COVID-19 pneumonia.  He was given IV steroids, remdesivir and Actemra and transferred to [facility] on 15 L high flow nasal cannula  Assessment/Plan:   Acute hypoxemic respiratory failure due to severe acute respiratory syndrome coronavirus 2 (SARS-CoV-2) disease (HCC) and bacterial PNA: Patient has been wean and is requiring 5 to 10 L of high flow nasal cannula to keep saturations greater than 95%.  He moved from the bed to the chair and got extremely weak and short of breath. He has completed 5-day course of IV remdesivir continue steroids for 10 days. The patient is doing a great job proning, continue to prone early 16 hours a day if not prone out of bed to chair, continue incentive spirometry. He has remained afebrile with no leukocytosis continue empiric antibiotic MRSA is negative. Culture data has remained negative till date, continue Rocephin and azithromycin.  He continues to remain afebrile has no leukocytosis. His D-dimer jumped again this morning from 7->20 we will repeat lower extremity Dopplers to rule out DVT.  Restart him on full dose Lovenox.  New small pneumomediastinum: CT of the chest on 02/13/2019 showed new pneumomediastinum, he was started on Protonix and Robitussin to 30 degrees coughing spells. Repeat a chest x-ray on 02/15/2019 stable pneumomediastinum chest x-ray shows improved pneumomediastinum.  New sinus tachycardia: Rate continues to improve continue current dose of metoprolol and as needed IV  metoprolol as needed.  History of squamous cell carcinoma of the lung: Continue outpatient treatment with oncologist.  Obesity: Follow-up with PCP with a BMI of 34.  GERD: Continue PPI.  Essential hypertension: Seems to be fairly controlled, mildly tachycardic this morning we will continue to monitor closely.  Elevated D-dimer: CRP and D-dimer rising, is greater than 20 we will start him on full dose anticoagulation get a lower extremity Doppler.  Asymptomatic elevated LFTs: Elevated LFTs likely due to COVID-19.  Hypokalemia: Repleted.  Ethics/palliative care advance goals of care: He remains a DNR/DNI.   DVT prophylaxis: lovenox Family Communication: Disposition Plan/Barrier to D/C: unable to determine Code Status:DNR   IV Access:    Peripheral IV   Procedures and diagnostic studies:   DG CHEST PORT 1 VIEW  Result Date: 02/15/2019 CLINICAL DATA:  Pneumonia due to COVID-19 infection EXAM: PORTABLE CHEST 1 VIEW COMPARISON:  02/14/2019 FINDINGS: The right chest wall port a catheter is again noted with tip projecting over the SVC. Endotracheal tube tip is not visualized. Normal heart size. Pneumo mediastinum is stable to improved in the interval. Fibrosis and masslike architectural distortion from external beam radiation in the left upper lobe is unchanged. Bilateral lower lobe hazy lung opacities consistent with viral pneumonia is similar to the previous study. IMPRESSION: 1. Stable to improved pneumomediastinum. 2. No change in aeration to the lungs compared with previous exam. 3. The previously noted ET tube is not confidently identified on today's exam. Electronically Signed   By: Kerby Moors M.D.   On: 02/15/2019 10:44     Medical Consultants:    None.  Anti-Infectives:   IV vancomycin Rocephin  and azithromycin  Subjective:    [name] he relates his breathing was better until he moved to the chair from the bed which makes them significantly  winded. Objective:    Vitals:   02/15/19 2122 02/16/19 0000 02/16/19 0403 02/16/19 0726  BP:  (!) 109/57 130/71 (!) 112/51  Pulse: 82  96   Resp: 20  (!) 32 (!) 25  Temp:  98.1 F (36.7 C) 98.2 F (36.8 C) 98.1 F (36.7 C)  TempSrc:  Axillary Oral Axillary  SpO2: 100% 93% 97% 100%  Height:       SpO2: 100 % O2 Flow Rate (L/min): 5 L/min(HF) FiO2 (%): 100 %   Intake/Output Summary (Last 24 hours) at 02/16/2019 0175 Last data filed at 02/16/2019 0753 Gross per 24 hour  Intake 610 ml  Output 800 ml  Net -190 ml   There were no vitals filed for this visit.  Exam: General exam: In no acute distress. Respiratory system: Good air movement and diffuse crackles bilaterally. Cardiovascular system: S1 & S2 heard, RRR. No JVD. Gastrointestinal system: Abdomen is nondistended, soft and nontender.  Central nervous system: Alert and oriented. No focal neurological deficits. Extremities: No pedal edema. Skin: No rashes, lesions or ulcers Psychiatry: Judgement and insight appear normal. Mood & affect appropriate.   Data Reviewed:    Labs: Basic Metabolic Panel: Recent Labs  Lab 02/10/19 0157 02/11/19 0407 02/12/19 0328 02/13/19 0420 02/14/19 0135  NA 139 141 142 140 138  K 3.9 3.7 3.4* 4.7 4.4  CL 101 105 102 105 99  CO2 22 24 24 24 23   GLUCOSE 110* 134* 111* 124* 75  BUN 22 29* 35* 37* 34*  CREATININE 0.88 1.12 1.08 1.08 1.19  CALCIUM 8.2* 8.7* 8.8* 8.6* 8.4*  MG  --  2.4 2.5* 2.5* 2.6*   GFR Estimated Creatinine Clearance: 71 mL/min (by C-G formula based on SCr of 1.19 mg/dL). Liver Function Tests: Recent Labs  Lab 02/10/19 0157 02/11/19 0407 02/12/19 0328 02/13/19 0420 02/14/19 0135  AST 65* 61* 54* 47* 41  ALT 58* 80* 90* 89* 79*  ALKPHOS 52 74 81 82 94  BILITOT 1.0 0.7 1.1 1.3* 0.9  PROT 7.5 7.7 7.5 7.4 6.7  ALBUMIN 3.3* 3.5 3.6 3.4* 3.2*   No results for input(s): LIPASE, AMYLASE in the last 168 hours. No results for input(s): AMMONIA in the last 168  hours. Coagulation profile No results for input(s): INR, PROTIME in the last 168 hours. COVID-19 Labs  Recent Labs    02/14/19 0135  DDIMER 7.73*  CRP 2.3*    Lab Results  Component Value Date   SARSCOV2NAA POSITIVE (A) 02/06/2019    CBC: Recent Labs  Lab 02/10/19 0157 02/11/19 0407 02/12/19 0328 02/13/19 0420 02/14/19 0135  WBC 6.7 12.7* 15.1* 11.6* 16.0*  NEUTROABS 5.8 11.5* 13.6* 10.4* 14.9*  HGB 13.5 14.3 14.8 14.4 14.1  HCT 42.0 45.1 45.9 45.0 44.1  MCV 87.0 87.2 88.3 87.4 87.7  PLT 212 293 346 322 289   Cardiac Enzymes: No results for input(s): CKTOTAL, CKMB, CKMBINDEX, TROPONINI in the last 168 hours. BNP (last 3 results) No results for input(s): PROBNP in the last 8760 hours. CBG: Recent Labs  Lab 02/12/19 1713 02/12/19 1938 02/13/19 0715 02/13/19 1119 02/13/19 1701  GLUCAP 119* 120* 108* 118* 156*   D-Dimer: Recent Labs    02/14/19 0135  DDIMER 7.73*   Hgb A1c: No results for input(s): HGBA1C in the last 72 hours. Lipid Profile: No  results for input(s): CHOL, HDL, LDLCALC, TRIG, CHOLHDL, LDLDIRECT in the last 72 hours. Thyroid function studies: No results for input(s): TSH, T4TOTAL, T3FREE, THYROIDAB in the last 72 hours.  Invalid input(s): FREET3 Anemia work up: No results for input(s): VITAMINB12, FOLATE, FERRITIN, TIBC, IRON, RETICCTPCT in the last 72 hours. Sepsis Labs: Recent Labs  Lab 02/11/19 0407 02/12/19 0328 02/13/19 0420 02/13/19 0938 02/14/19 0135 02/15/19 0928  PROCALCITON <0.10 <0.10  --  <0.10 0.45 0.50  WBC 12.7* 15.1* 11.6*  --  16.0*  --    Microbiology Recent Results (from the past 240 hour(s))  CULTURE, BLOOD (ROUTINE X 2) w Reflex to ID Panel     Status: None   Collection Time: 02/06/19  8:36 AM   Specimen: BLOOD  Result Value Ref Range Status   Specimen Description BLOOD BLOOD LEFT HAND  Final   Special Requests   Final    BOTTLES DRAWN AEROBIC AND ANAEROBIC Blood Culture results may not be optimal due to  an excessive volume of blood received in culture bottles   Culture   Final    NO GROWTH 5 DAYS Performed at [facility], [address] [place], [place] [address]    Report Status 02/11/2019 FINAL  Final  CULTURE, BLOOD (ROUTINE X 2) w Reflex to ID Panel     Status: None   Collection Time: 02/06/19  8:43 AM   Specimen: BLOOD  Result Value Ref Range Status   Specimen Description BLOOD BLOOD RIGHT HAND  Final   Special Requests   Final    BOTTLES DRAWN AEROBIC AND ANAEROBIC Blood Culture results may not be optimal due to an excessive volume of blood received in culture bottles   Culture   Final    NO GROWTH 5 DAYS Performed at [facility], [address] [place], [place] [address]    Report Status 02/11/2019 FINAL  Final  SARS CORONAVIRUS 2 (TAT 6-24 HRS) Nasopharyngeal Nasopharyngeal Swab     Status: Abnormal   Collection Time: 02/06/19  8:17 PM   Specimen: Nasopharyngeal Swab  Result Value Ref Range Status   SARS Coronavirus 2 POSITIVE (A) NEGATIVE Final    Comment: RESULT CALLED TO, READ BACK BY AND VERIFIED WITH: Gayland Curry RN 11:00 02/07/19 ([email or web address]) (NOTE) SARS-CoV-2 target nucleic acids are DETECTED. The SARS-CoV-2 RNA is generally detectable in upper and lower respiratory specimens during the acute phase of infection. Positive results are indicative of the presence of SARS-CoV-2 RNA. Clinical correlation with patient history and other diagnostic information is  necessary to determine patient infection status. Positive results do not rule out bacterial infection or co-infection with other viruses.  The expected result is Negative. Fact Sheet for Patients: SugarRoll.be Fact Sheet for Healthcare Providers: https://www.woods-mathews.com/ This test is not yet approved or cleared by the [place] FDA and  has been authorized for detection and/or diagnosis of SARS-CoV-2 by FDA under an Emergency Use  Authorization (EUA). This EUA will remain  in effect (meaning this test can be used) for  the duration of the COVID-19 declaration under Section 564(b)(1) of the Act, 21 U.S.C. section 360bbb-3(b)(1), unless the authorization is terminated or revoked sooner. Performed at [facility], [place] [address] [place], [place] [address]   Culture, blood (Routine X 2) w Reflex to ID Panel     Status: None (Preliminary result)   Collection Time: 02/13/19  9:38 AM   Specimen: BLOOD  Result Value Ref Range Status   Specimen Description  Final    BLOOD RIGHT ARM Performed at [facility] [address], [place], [place] [address]    Special Requests   Final    BOTTLES DRAWN AEROBIC AND ANAEROBIC Blood Culture adequate volume Performed at [facility] [address], [place], [place] [address]    Culture   Final    NO GROWTH 2 DAYS Performed at [facility] [address], [place], [place] [address]    Report Status PENDING  Incomplete  Culture, blood (Routine X 2) w Reflex to ID Panel     Status: None (Preliminary result)   Collection Time: 02/13/19  9:52 AM   Specimen: BLOOD  Result Value Ref Range Status   Specimen Description   Final    BLOOD RIGHT ARM Performed at [facility] [address] [place], [place] [address]    Special Requests   Final    BOTTLES DRAWN AEROBIC AND ANAEROBIC Blood Culture adequate volume Performed at [facility] [address] [place], [place] [address]    Culture   Final    NO GROWTH 2 DAYS Performed at [facility] [address] [place], [place] [address]    Report Status PENDING  Incomplete  MRSA PCR Screening     Status: None   Collection Time: 02/14/19  5:00 PM   Specimen: Nasal Mucosa; Nasopharyngeal  Result Value Ref Range Status   MRSA by PCR NEGATIVE NEGATIVE Final    Comment:        The GeneXpert MRSA Assay (FDA approved for NASAL  specimens only), is one component of a comprehensive MRSA colonization surveillance program. It is not intended to diagnose MRSA infection nor to guide or monitor treatment for MRSA infections. Performed at [facility], [place] [address], [place], [place] [address]      Medications:   . vitamin C  500 mg Oral Daily  . aspirin EC  81 mg Oral Daily  . atorvastatin  10 mg Oral Daily  . chlorpheniramine-HYDROcodone  5 mL Oral Q12H  . docusate sodium  100 mg Oral Daily  . enoxaparin (LOVENOX) injection  0.5 mg/kg Subcutaneous Q12H  . fluticasone  2 puff Inhalation BID  . Ipratropium-Albuterol  1 puff Inhalation Q6H  . metoprolol tartrate  100 mg Oral BID  . pantoprazole  40 mg Oral Daily  . terazosin  1 mg Oral QHS  . zinc sulfate  220 mg Oral Daily   Continuous Infusions: . azithromycin Stopped (02/15/19 1157)  . cefTRIAXone (ROCEPHIN)  IV Stopped (02/15/19 0955)      LOS: 7 days   Charlynne Cousins  [facility]  02/16/2019, 8:07 AM

## 2019-02-17 ENCOUNTER — Inpatient Hospital Stay (HOSPITAL_COMMUNITY): Payer: Medicare HMO

## 2019-02-17 DIAGNOSIS — R791 Abnormal coagulation profile: Secondary | ICD-10-CM

## 2019-02-17 DIAGNOSIS — U071 COVID-19: Secondary | ICD-10-CM

## 2019-02-17 LAB — BASIC METABOLIC PANEL WITH GFR
Anion gap: 11 (ref 5–15)
BUN: 25 mg/dL — ABNORMAL HIGH (ref 8–23)
CO2: 25 mmol/L (ref 22–32)
Calcium: 8.3 mg/dL — ABNORMAL LOW (ref 8.9–10.3)
Chloride: 103 mmol/L (ref 98–111)
Creatinine, Ser: 1.05 mg/dL (ref 0.61–1.24)
GFR calc Af Amer: >60 mL/min (ref >60)
GFR calc non Af Amer: >60 mL/min (ref >60)
Glucose, Bld: 85 mg/dL (ref 70–99)
Potassium: 4.8 mmol/L (ref 3.5–5.1)
Sodium: 139 mmol/L (ref 135–145)

## 2019-02-17 LAB — CBC WITH DIFFERENTIAL/PLATELET
Abs Immature Granulocytes: 0.1 K/uL — ABNORMAL HIGH (ref 0.00–0.07)
Basophils Absolute: 0 K/uL (ref 0.0–0.1)
Basophils Relative: 0 %
Eosinophils Absolute: 0.3 K/uL (ref 0.0–0.5)
Eosinophils Relative: 2 %
HCT: 45.2 % (ref 39.0–52.0)
Hemoglobin: 14.4 g/dL (ref 13.0–17.0)
Immature Granulocytes: 1 %
Lymphocytes Relative: 4 %
Lymphs Abs: 0.5 K/uL — ABNORMAL LOW (ref 0.7–4.0)
MCH: 27.8 pg (ref 26.0–34.0)
MCHC: 31.9 g/dL (ref 30.0–36.0)
MCV: 87.3 fL (ref 80.0–100.0)
Monocytes Absolute: 0.2 K/uL (ref 0.1–1.0)
Monocytes Relative: 2 %
Neutro Abs: 11.2 K/uL — ABNORMAL HIGH (ref 1.7–7.7)
Neutrophils Relative %: 91 %
Platelets: 110 K/uL — ABNORMAL LOW (ref 150–400)
RBC: 5.18 MIL/uL (ref 4.22–5.81)
RDW: 13.3 % (ref 11.5–15.5)
WBC: 12.3 K/uL — ABNORMAL HIGH (ref 4.0–10.5)
nRBC: 0 % (ref 0.0–0.2)

## 2019-02-17 LAB — D-DIMER, QUANTITATIVE: D-Dimer, Quant: >20 ug/mL-FEU — ABNORMAL HIGH (ref 0.00–0.50)

## 2019-02-17 LAB — C-REACTIVE PROTEIN: CRP: 0.6 mg/dL (ref <1.0)

## 2019-02-17 NOTE — Progress Notes (Signed)
Updated on care   02/17/19 1200  Family/Significant Other Communication  Family/Significant Other Update Called;Updated

## 2019-02-17 NOTE — Plan of Care (Addendum)
Throughout the day nurse slowly weaned first NRB from 15L to now 6L. Currently, on 15L HF. With both pt sat 88-97%. Pt would often times state "Can you turn the air up" after displaying labored breathing however, O2 reading (O2 probe correctly on earlobe) 93%. Writer provided pt with PRN ativan for anxiety instead of increase oxygen. After PRN was given and assistance with I/S pt labor breathing resolved O2 reading remained 91-97%. Pt received strong encouragement to get up out of bed and motive to eat food. Pt was able to sit in chair and drink ensure's provided. Will continue to monitor.   Problem: Education: Goal: Knowledge of risk factors and measures for prevention of condition will improve Outcome: Progressing   Problem: Coping: Goal: Psychosocial and spiritual needs will be supported Outcome: Progressing   Problem: Respiratory: Goal: Will maintain a patent airway Outcome: Progressing Goal: Complications related to the disease process, condition or treatment will be avoided or minimized Outcome: Progressing

## 2019-02-17 NOTE — Progress Notes (Signed)
Bilateral lower extremity venous duplex has been completed. Preliminary results can be found in CV Proc through chart review.   02/17/19 11:09 AM [name] RVT

## 2019-02-17 NOTE — Progress Notes (Signed)
[facility] PROGRESS NOTE    Progress Note  [name]  AYT:[id number] DOB: Jun 16, 1945 DOA: 02/12/2019 PCP: Glean Hess, MD     Brief Narrative:   [name] is an 74 y.o. male past medical history significant for squamous cell carcinoma of the lung status post chemotherapy in 2018, diabetes, morbid obesity, vitamin D deficiency, hypertension presents to [place] with shortness of breath and acute hypoxic respiratory failure due to COVID-19 pneumonia.  He was given IV steroids, remdesivir and Actemra and transferred to [facility] on 15 L high flow nasal cannula  Assessment/Plan:   Acute hypoxemic respiratory failure due to severe acute respiratory syndrome coronavirus 2 (SARS-CoV-2) disease (HCC) and bacterial PNA: He is backup to requiring 15 L of high flow nasal cannula to keep saturations greater than 91%.  When I went to the room he was satting 100% and even talking and sitting up, I think we are weaning him down a little bit. Has completed a 5-day course of IV remdesivir, he has completed his 10-day course of steroids. The patient yesterday spent most of the day on 5 L of oxygen while prone he is doing a great job proning have encouraged him to continue to do this. Try to keep the patient peripherally 16 hours a day, if not prone out of bed to chair, concern incentive spirometry and flutter valve. Culture data negative till date we will continue IV Rocephin and azithromycin he remains afebrile with no leukocytosis.  Continue empiric antibiotics for at least 7 days. His D-dimer yesterday jumped to 20 he was placed on full dose Lovenox, there is a second time he has done this.  His D-dimer today continues to be about 20, his CRP is 0.6.  New small pneumomediastinum: 02/13/2019 showed no mediastinum he was started on Tussionex and Robitussin to try to minimize cough. Repeat a chest x-ray showed improving pneumomediastinum, chest x-ray on 02/17/2019 is pending  at the time of this dictation.  New sinus tachycardia: Rate has improved, continue metoprolol and IV as needed as needed.  History of squamous cell carcinoma of the lung: Continue outpatient treatment with oncologist.  Obesity: Follow-up with PCP with a BMI of 34.  GERD: Continue PPI.  Essential hypertension: Seems to be fairly controlled, mildly tachycardic this morning we will continue to monitor closely.  Elevated D-dimer: CRP and D-dimer rising, is significantly elevated greater than 20 currently on full dose Lovenox awaiting lower extremity Dopplers.  Asymptomatic elevated LFTs: Elevated LFTs likely due to COVID-19.  Hypokalemia: Repleted.  Ethics/palliative care advance goals of care: He remains a DNR/DNI.   DVT prophylaxis: lovenox Family Communication: Disposition Plan/Barrier to D/C: unable to determine Code Status:DNR   IV Access:    Peripheral IV   Procedures and diagnostic studies:   DG CHEST PORT 1 VIEW  Result Date: 02/16/2019 CLINICAL DATA:  Pneumomediastinum. EXAM: PORTABLE CHEST 1 VIEW COMPARISON:  February 15, 2019. FINDINGS: Stable cardiomediastinal silhouette. Right internal jugular Port-A-Cath is noted. No definite pneumothorax is noted. Stable minimal pneumomediastinum. Stable bibasilar opacities are noted concerning for pneumonia or atelectasis. Bony thorax is unremarkable. IMPRESSION: Stable minimal pneumomediastinum. Stable bibasilar opacities are noted concerning for pneumonia or atelectasis. Electronically Signed   By: Marijo Conception M.D.   On: 02/16/2019 08:39   DG CHEST PORT 1 VIEW  Result Date: 02/15/2019 CLINICAL DATA:  Pneumonia due to COVID-19 infection EXAM: PORTABLE CHEST 1 VIEW COMPARISON:  02/14/2019 FINDINGS: The right chest wall port a catheter is  again noted with tip projecting over the SVC. Endotracheal tube tip is not visualized. Normal heart size. Pneumo mediastinum is stable to improved in the interval. Fibrosis and masslike  architectural distortion from external beam radiation in the left upper lobe is unchanged. Bilateral lower lobe hazy lung opacities consistent with viral pneumonia is similar to the previous study. IMPRESSION: 1. Stable to improved pneumomediastinum. 2. No change in aeration to the lungs compared with previous exam. 3. The previously noted ET tube is not confidently identified on today's exam. Electronically Signed   By: Kerby Moors M.D.   On: 02/15/2019 10:44     Medical Consultants:    None.  Anti-Infectives:   IV vancomycin Rocephin and azithromycin  Subjective:    [name] relates his breathing is about the same as yesterday. Objective:    Vitals:   02/16/19 2146 02/17/19 0024 02/17/19 0435 02/17/19 0436  BP: 126/64 (!) 121/101  118/71  Pulse: (!) 115     Resp: (!) 23 (!) 26  (!) 24  Temp:  99.4 F (37.4 C) 97.9 F (36.6 C)   TempSrc:   Axillary   SpO2: 95% 95%    Weight:      Height:       SpO2: 95 % O2 Flow Rate (L/min): 15 L/min FiO2 (%): 100 %   Intake/Output Summary (Last 24 hours) at 02/17/2019 0807 Last data filed at 02/17/2019 0100 Gross per 24 hour  Intake 1260 ml  Output 300 ml  Net 960 ml   Filed Weights   02/16/19 1856  Weight: 103.7 kg    Exam: General exam: In no acute distress. Respiratory system: Good air movement and use crackles bilaterally. Cardiovascular system: S1 & S2 heard, RRR. No JVD.  Gastrointestinal system: Abdomen is nondistended, soft and nontender.  Central nervous system: Alert and oriented. No focal neurological deficits. Extremities: No pedal edema. Skin: No rashes, lesions or ulcers Psychiatry: Judgement and insight appear normal. Mood & affect appropriate.    Data Reviewed:    Labs: Basic Metabolic Panel: Recent Labs  Lab 02/11/19 0407 02/12/19 0328 02/13/19 0420 02/14/19 0135 02/16/19 0947 02/17/19 0412  NA 141 142 140 138 140 139  K 3.7 3.4* 4.7 4.4 3.8 4.8  CL 105 102 105 99 102 103  CO2 24 24  24 23 24 25   GLUCOSE 134* 111* 124* 75 144* 85  BUN 29* 35* 37* 34* 30* 25*  CREATININE 1.12 1.08 1.08 1.19 1.17 1.05  CALCIUM 8.7* 8.8* 8.6* 8.4* 8.6* 8.3*  MG 2.4 2.5* 2.5* 2.6*  --   --    GFR Estimated Creatinine Clearance: 78 mL/min (by C-G formula based on SCr of 1.05 mg/dL). Liver Function Tests: Recent Labs  Lab 02/11/19 0407 02/12/19 0328 02/13/19 0420 02/14/19 0135 02/16/19 0947  AST 61* 54* 47* 41 61*  ALT 80* 90* 89* 79* 45*  ALKPHOS 74 81 82 94 199*  BILITOT 0.7 1.1 1.3* 0.9 0.8  PROT 7.7 7.5 7.4 6.7 7.1  ALBUMIN 3.5 3.6 3.4* 3.2* 3.4*   No results for input(s): LIPASE, AMYLASE in the last 168 hours. No results for input(s): AMMONIA in the last 168 hours. Coagulation profile No results for input(s): INR, PROTIME in the last 168 hours. COVID-19 Labs  Recent Labs    02/16/19 0947 02/17/19 0412  DDIMER >20.00* >20.00*  CRP 0.7 0.6    Lab Results  Component Value Date   SARSCOV2NAA POSITIVE (A) 02/06/2019    CBC: Recent Labs  Lab 02/12/19 0328 02/13/19 0420 02/14/19 0135 02/16/19 0947 02/17/19 0412  WBC 15.1* 11.6* 16.0* 14.8* 12.3*  NEUTROABS 13.6* 10.4* 14.9* 14.0* 11.2*  HGB 14.8 14.4 14.1 15.5 14.4  HCT 45.9 45.0 44.1 49.2 45.2  MCV 88.3 87.4 87.7 87.9 87.3  PLT 346 322 289 166 110*   Cardiac Enzymes: No results for input(s): CKTOTAL, CKMB, CKMBINDEX, TROPONINI in the last 168 hours. BNP (last 3 results) No results for input(s): PROBNP in the last 8760 hours. CBG: Recent Labs  Lab 02/12/19 1713 02/12/19 1938 02/13/19 0715 02/13/19 1119 02/13/19 1701  GLUCAP 119* 120* 108* 118* 156*   D-Dimer: Recent Labs    02/16/19 0947 02/17/19 0412  DDIMER >20.00* >20.00*   Hgb A1c: No results for input(s): HGBA1C in the last 72 hours. Lipid Profile: No results for input(s): CHOL, HDL, LDLCALC, TRIG, CHOLHDL, LDLDIRECT in the last 72 hours. Thyroid function studies: No results for input(s): TSH, T4TOTAL, T3FREE, THYROIDAB in the last  72 hours.  Invalid input(s): FREET3 Anemia work up: No results for input(s): VITAMINB12, FOLATE, FERRITIN, TIBC, IRON, RETICCTPCT in the last 72 hours. Sepsis Labs: Recent Labs  Lab 02/12/19 0328 02/13/19 0420 02/13/19 7619 02/14/19 0135 02/15/19 0928 02/16/19 0947 02/17/19 0412  PROCALCITON <0.10  --  <0.10 0.45 0.50  --   --   WBC 15.1* 11.6*  --  16.0*  --  14.8* 12.3*   Microbiology Recent Results (from the past 240 hour(s))  Culture, blood (Routine X 2) w Reflex to ID Panel     Status: None (Preliminary result)   Collection Time: 02/13/19  9:38 AM   Specimen: BLOOD  Result Value Ref Range Status   Specimen Description   Final    BLOOD RIGHT ARM Performed at [facility] [address], [place], [place] [address]    Special Requests   Final    BOTTLES DRAWN AEROBIC AND ANAEROBIC Blood Culture adequate volume Performed at [facility] [address], [place], [place] [address]    Culture   Final    NO GROWTH 3 DAYS Performed at [facility], [place] [address] [place], [place] [address]    Report Status PENDING  Incomplete  Culture, blood (Routine X 2) w Reflex to ID Panel     Status: None (Preliminary result)   Collection Time: 02/13/19  9:52 AM   Specimen: BLOOD  Result Value Ref Range Status   Specimen Description   Final    BLOOD RIGHT ARM Performed at [facility] [address], [place], [place] [address]    Special Requests   Final    BOTTLES DRAWN AEROBIC AND ANAEROBIC Blood Culture adequate volume Performed at [facility] [address], [place], [place] [address]    Culture   Final    NO GROWTH 3 DAYS Performed at [facility], [place] [address], [place], [place] [address]    Report Status PENDING  Incomplete  MRSA PCR Screening     Status: None   Collection Time: 02/14/19  5:00 PM   Specimen: Nasal Mucosa; Nasopharyngeal  Result Value Ref Range Status   MRSA  by PCR NEGATIVE NEGATIVE Final    Comment:        The GeneXpert MRSA Assay (FDA approved for NASAL specimens only), is one component of a comprehensive MRSA colonization surveillance program. It is not intended to diagnose MRSA infection nor to guide or monitor treatment for MRSA infections. Performed  at [facility], [place] [address] [place], [place] [address]      Medications:   . vitamin C  500 mg Oral Daily  . aspirin EC  81 mg Oral Daily  . atorvastatin  10 mg Oral Daily  . chlorpheniramine-HYDROcodone  5 mL Oral Q12H  . docusate sodium  100 mg Oral Daily  . enoxaparin (LOVENOX) injection  1 mg/kg Subcutaneous Q12H  . fluticasone  2 puff Inhalation BID  . Ipratropium-Albuterol  1 puff Inhalation Q6H  . metoprolol tartrate  100 mg Oral BID  . pantoprazole  40 mg Oral Daily  . terazosin  1 mg Oral QHS  . zinc sulfate  220 mg Oral Daily   Continuous Infusions: . azithromycin Stopped (02/16/19 1124)  . cefTRIAXone (ROCEPHIN)  IV Stopped (02/16/19 1136)      LOS: 8 days   Charlynne Cousins  [facility]  02/17/2019, 8:07 AM

## 2019-02-18 ENCOUNTER — Inpatient Hospital Stay (HOSPITAL_COMMUNITY): Payer: Medicare HMO

## 2019-02-18 LAB — CULTURE, BLOOD (ROUTINE X 2)
Culture: NO GROWTH
Culture: NO GROWTH
Special Requests: ADEQUATE
Special Requests: ADEQUATE

## 2019-02-18 LAB — BASIC METABOLIC PANEL
Anion gap: 12 (ref 5–15)
BUN: 23 mg/dL (ref 8–23)
CO2: 26 mmol/L (ref 22–32)
Calcium: 8.4 mg/dL — ABNORMAL LOW (ref 8.9–10.3)
Chloride: 100 mmol/L (ref 98–111)
Creatinine, Ser: 0.9 mg/dL (ref 0.61–1.24)
GFR calc Af Amer: >60 mL/min (ref >60)
GFR calc non Af Amer: >60 mL/min (ref >60)
Glucose, Bld: 101 mg/dL — ABNORMAL HIGH (ref 70–99)
Potassium: 4.2 mmol/L (ref 3.5–5.1)
Sodium: 138 mmol/L (ref 135–145)

## 2019-02-18 LAB — CBC WITH DIFFERENTIAL/PLATELET
Abs Immature Granulocytes: 0.07 10*3/uL (ref 0.00–0.07)
Basophils Absolute: 0 10*3/uL (ref 0.0–0.1)
Basophils Relative: 0 %
Eosinophils Absolute: 0.2 10*3/uL (ref 0.0–0.5)
Eosinophils Relative: 2 %
HCT: 46.5 % (ref 39.0–52.0)
Hemoglobin: 15 g/dL (ref 13.0–17.0)
Immature Granulocytes: 1 %
Lymphocytes Relative: 3 %
Lymphs Abs: 0.4 10*3/uL — ABNORMAL LOW (ref 0.7–4.0)
MCH: 28.4 pg (ref 26.0–34.0)
MCHC: 32.3 g/dL (ref 30.0–36.0)
MCV: 88.1 fL (ref 80.0–100.0)
Monocytes Absolute: 0.2 10*3/uL (ref 0.1–1.0)
Monocytes Relative: 2 %
Neutro Abs: 11.6 10*3/uL — ABNORMAL HIGH (ref 1.7–7.7)
Neutrophils Relative %: 92 %
Platelets: 103 10*3/uL — ABNORMAL LOW (ref 150–400)
RBC: 5.28 MIL/uL (ref 4.22–5.81)
RDW: 13.4 % (ref 11.5–15.5)
WBC: 12.5 10*3/uL — ABNORMAL HIGH (ref 4.0–10.5)
nRBC: 0 % (ref 0.0–0.2)

## 2019-02-18 LAB — C-REACTIVE PROTEIN: CRP: 0.6 mg/dL (ref <1.0)

## 2019-02-18 LAB — D-DIMER, QUANTITATIVE: D-Dimer, Quant: 8.78 ug/mL-FEU — ABNORMAL HIGH (ref 0.00–0.50)

## 2019-02-18 MED ORDER — METOPROLOL TARTRATE 25 MG PO TABS
100.0000 mg | ORAL_TABLET | Freq: Once | ORAL | Status: AC
  Administered 2019-02-18: 100 mg via ORAL

## 2019-02-18 MED ORDER — VANCOMYCIN HCL 2000 MG/400ML IV SOLN
2000.0000 mg | Freq: Once | INTRAVENOUS | Status: AC
  Administered 2019-02-18: 2000 mg via INTRAVENOUS
  Filled 2019-02-18: qty 400

## 2019-02-18 MED ORDER — ENSURE ENLIVE PO LIQD
237.0000 mL | Freq: Three times a day (TID) | ORAL | Status: DC
  Administered 2019-02-18 – 2019-02-19 (×4): 237 mL via ORAL

## 2019-02-18 MED ORDER — VANCOMYCIN HCL 1750 MG/350ML IV SOLN
1750.0000 mg | INTRAVENOUS | Status: DC
  Administered 2019-02-19: 09:00:00 1750 mg via INTRAVENOUS
  Filled 2019-02-18 (×2): qty 350

## 2019-02-18 MED ORDER — SODIUM CHLORIDE 0.9 % IV BOLUS: 1000.0000 mL | Freq: Once | INTRAVENOUS | Status: DC

## 2019-02-18 MED ORDER — LIP MEDEX EX OINT
TOPICAL_OINTMENT | CUTANEOUS | Status: DC | PRN
  Filled 2019-02-18: qty 7

## 2019-02-18 MED ORDER — ENOXAPARIN SODIUM 120 MG/0.8ML ~~LOC~~ SOLN
1.0000 mg/kg | Freq: Two times a day (BID) | SUBCUTANEOUS | Status: DC
  Administered 2019-02-18 – 2019-02-19 (×3): 105 mg via SUBCUTANEOUS
  Filled 2019-02-18 (×4): qty 0.8

## 2019-02-18 MED ORDER — SODIUM CHLORIDE 0.9 % IV SOLN
2.0000 g | Freq: Three times a day (TID) | INTRAVENOUS | Status: DC
  Administered 2019-02-18 – 2019-02-19 (×6): 2 g via INTRAVENOUS
  Filled 2019-02-18 (×6): qty 2

## 2019-02-18 NOTE — Progress Notes (Signed)
Updated "Izell [place]" on POC.    02/18/19 1100  Family/Significant Other Communication  Family/Significant Other Update Called;Updated

## 2019-02-18 NOTE — Progress Notes (Signed)
Occupational Therapy Treatment Patient Details Name: [name] MRN: [id number] DOB: 11/19/1945 Today's Date: 02/18/2019    History of present illness Brief Narrative  [name]  is a 74 y.o. [place] y.o. male with medical history significant of squamous cell carcinoma of the lung status post treatment since 2018, BPH, prediabetic, morbid obesity, vitamin D deficiency, vitamin B-12 deficiency, HTN, GERD, CAD who presented to [facility] with shortness of breath was diagnosed with acute hypoxic respiratory failure due to COVID-19 pneumonia.  Was appropriately treated with IV steroids, remdesivir and Actemra thereafter transferred to [facility] on 15 L high flow nasal cannula oxygen.   OT comments  Pt continues to present with decreased activity tolerance. Pt requiring Min Guard-Min A for functional transfers and Max A for peri care with toileting due to fatigue. SpO2 dropping to 70-80s on 15L via HFNC and NRB, RR 50-60s, and HR 90s-110s. Pt requiring significant time to recover from minimal activity. Cues for purse lip breathing. Continue to recommend dc to home with Dora; however, pending his progress, pt may require post-acute rehab. Will continue to follow acutely as admitted.    Follow Up Recommendations  Home health OT;Supervision/Assistance - 24 hour   Equipment Recommendations  3 in 1 bedside commode(As shower seat)    Recommendations for Other Services PT consult    Precautions / Restrictions Precautions Precautions: Fall Restrictions Weight Bearing Restrictions: No       Mobility Bed Mobility               General bed mobility comments: Upon arrival, pt prone in bed. Leaving room to notify RN about IV leak. Upon return, pt OOB on [facility] with different RN.  Transfers Overall transfer level: Needs assistance Equipment used: Rolling walker (2 wheeled) Transfers: Sit to/from [facility] Sit to Stand: Min guard;Min assist Stand pivot transfers:  Min guard       General transfer comment: Min A for safe descent. Min Guard A for safety in pivot    Balance Overall balance assessment: Mild deficits observed, not formally tested                                         ADL either performed or assessed with clinical judgement   ADL Overall ADL's : Needs assistance/impaired                     Lower Body Dressing: Minimal assistance;Sit to/from stand Lower Body Dressing Details (indicate cue type and reason): Pt bringing ankles to knees to don socks. Requiring seated rest breaks inbetween due to decreased activity tolerance and Spo2 dropping into 80-70s on 15L via HFNC and NBR.  Toilet Transfer: Minimal Systems analyst Details (indicate cue type and reason): Min A for safe descent.  Toileting- Clothing Manipulation and Hygiene: Maximal assistance;Sit to/from stand Toileting - Clothing Manipulation Details (indicate cue type and reason): Max A for peri care after BM due to poor actiivty tolerance. Pt fatigues quickly     Functional mobility during ADLs: Min guard;Minimal assistance General ADL Comments: Pt presenting with poor acitvity tolerance and fatigues quickly with minimal movement. SpO2 80-70s with activity on 15L via HFNC and NRB     Vision       Perception     Praxis      Cognition Arousal/Alertness: Awake/alert Behavior During Therapy: WFL for tasks assessed/performed Overall Cognitive  Status: Within Functional Limits for tasks assessed                                          Exercises     Shoulder Instructions       General Comments SpO2 dropping to 70s on 15L via HFNC and NRB during minimal acitivty. RR elevating to 40-50s. HR 110s.    Pertinent Vitals/ Pain       Pain Assessment: No/denies pain  Home Living                                          Prior Functioning/Environment              Frequency   Min 3X/week        Progress Toward Goals  OT Goals(current goals can now be found in the care plan section)  Progress towards OT goals: Progressing toward goals  Acute Rehab OT Goals Patient Stated Goal: Just indicates that he wants to go home and be with his family- but be able to do as much to help himself as is possible. OT Goal Formulation: With patient Time For Goal Achievement: 02/25/19 Potential to Achieve Goals: Good ADL Goals Pt Will Perform Grooming: with modified independence;standing Pt Will Perform Upper Body Dressing: with modified independence;standing Pt Will Perform Lower Body Dressing: with modified independence;sit to/from stand Pt Will Transfer to Toilet: with modified independence;ambulating;regular height toilet Pt Will Perform Toileting - Clothing Manipulation and hygiene: with modified independence;sit to/from stand;sitting/lateral leans Additional ADL Goal #1: Pt will independently verbalize three energy conservation techniques for ADLs Additional ADL Goal #2: Pt will monitor O2 and incorporate purse lip breathing techniques independently during ADLs  Plan Discharge plan remains appropriate    Co-evaluation                 AM-PAC OT "6 Clicks" Daily Activity     Outcome Measure   Help from another person eating meals?: None Help from another person taking care of personal grooming?: A Little Help from another person toileting, which includes using toliet, bedpan, or urinal?: A Little Help from another person bathing (including washing, rinsing, drying)?: A Little Help from another person to put on and taking off regular upper body clothing?: A Little Help from another person to put on and taking off regular lower body clothing?: A Little 6 Click Score: 19    End of Session Equipment Utilized During Treatment: Oxygen(15L)  OT Visit Diagnosis: Unsteadiness on feet (R26.81);Other abnormalities of gait and mobility (R26.89);Muscle weakness  (generalized) (M62.81)   Activity Tolerance Patient tolerated treatment well   Patient Left in chair;with call bell/phone within reach   Nurse Communication Mobility status        Time: 1350-1433 OT Time Calculation (min): 43 min  Charges: OT General Charges $OT Visit: 1 Visit OT Treatments $Self Care/Home Management : 38-52 mins  [place], OTR/L Acute Rehab Pager: [phone] Office: [place] 02/18/2019, 5:51 PM

## 2019-02-18 NOTE — Progress Notes (Signed)
[facility] PROGRESS NOTE    Progress Note  TEDD COTTRILL  NAT:[id number] DOB: [birth year] DOA: 02/01/2019 PCP: Glean Hess, MD     Brief Narrative:   [name] is an 74 y.o. male past medical history significant for squamous cell carcinoma of the lung status post chemotherapy in 2018, diabetes, morbid obesity, vitamin D deficiency, hypertension presents to [place] with shortness of breath and acute hypoxic respiratory failure due to COVID-19 pneumonia.  He was given IV steroids, remdesivir and Actemra and transferred to [facility] on 15 L high flow nasal cannula  Assessment/Plan:   Acute hypoxemic respiratory failure due to severe acute respiratory syndrome coronavirus 2 (SARS-CoV-2) disease (HCC) and bacterial PNA: He is requiring 15 L of high flow nasal cannula to keep saturations greater than 91%. Has completed his 5-day course of IV remdesivir 10 days course of steroids. The patient continues to do a great job proning.,  When not prone out of bed to chair, continues into spirometry and flutter valve. He has been placed on Rocephin and azithromycin,CT Angie of the chest that showed no infiltrates.  His inflammatory markers are improved except for his D-dimer, he has remained afebrile. We currently have him on full dose anticoagulation as his D-dimer was greater than 20 for 2 days, this morning is 8.7, his lower extremity Doppler is negative,  Chest x-ray done this morning showed mild progression of an infiltrate on the right we will change his antibiotic to Vanco and cefepime for total of 7 days.  New small pneumomediastinum: Chest x-ray done showed no pneumomediastinum but mild progression of right lower lobe infiltrates, will discontinue Tussionex continue Robitussin. Continue incentive spirometry and flutter valve  New sinus tachycardia: His sinus tachycardia has returned we will continue metoprolol orally and IV as needed, his blood pressure has  remained stable.  History of squamous cell carcinoma of the lung: Continue outpatient treatment with oncologist.  Obesity: Follow-up with PCP with a BMI of 34.  GERD: Continue PPI.  Essential hypertension: Seems to be fairly controlled, mildly tachycardic this morning we will continue to monitor closely.  Elevated D-dimer: His D-dimer has improved to 8.7, it has previously been greater than 20 for 2 days, will continue on full dose anticoagulation no signs of overt bleeding.  Asymptomatic elevated LFTs: Elevated LFTs likely due to COVID-19.  Hypokalemia: Repleted.  Ethics/palliative care advance goals of care: He remains a DNR/DNI.   DVT prophylaxis: lovenox Family Communication: Disposition Plan/Barrier to D/C: unable to determine Code Status:DNR   IV Access:    Peripheral IV   Procedures and diagnostic studies:   DG CHEST PORT 1 VIEW  Result Date: 02/18/2019 CLINICAL DATA:  Dyspnea.  Lung cancer history EXAM: PORTABLE CHEST 1 VIEW COMPARISON:  02/17/2019 FINDINGS: Mild progression of right upper lobe and right lower lobe airspace disease, probable pneumonia. Left lower lobe atelectasis/infiltrate unchanged. Radiation scarring left upper lobe unchanged. Port-A-Cath tip in the upper SVC with coil in the jugular vein as noted previously. No pleural effusion IMPRESSION: Bilateral airspace disease, with mild progression on the right. Probable pneumonia. Electronically Signed   By: Franchot Gallo M.D.   On: 02/18/2019 08:01   DG CHEST PORT 1 VIEW  Result Date: 02/17/2019 CLINICAL DATA:  Dyspnea. EXAM: PORTABLE CHEST 1 VIEW COMPARISON:  February 16, 2019. FINDINGS: Stable cardiomediastinal silhouette. No pneumothorax is noted. Right internal jugular Port-A-Cath is again noted. No pneumothorax is noted. Stable bibasilar opacities are noted concerning for multifocal pneumonia. Pneumomediastinum is  not well visualized currently. Bony thorax is unremarkable. IMPRESSION: Stable  bibasilar opacities are noted concerning for multifocal pneumonia. Pneumomediastinum is not well visualized currently. Electronically Signed   By: Marijo Conception M.D.   On: 02/17/2019 09:15   VAS [place] LOWER EXTREMITY VENOUS (DVT)  Result Date: 02/17/2019  Lower Venous Study Indications: Elevated Ddimer.  Risk Factors: COVID 19 positive. Comparison Study: 02/10/2019 - Negative for DVT. Performing Technologist: Oliver Hum RVT  Examination Guidelines: A complete evaluation includes B-mode imaging, spectral Doppler, color Doppler, and power Doppler as needed of all accessible portions of each vessel. Bilateral testing is considered an integral part of a complete examination. Limited examinations for reoccurring indications may be performed as noted.  +---------+---------------+---------+-----------+----------+--------------+ RIGHT    CompressibilityPhasicitySpontaneityPropertiesThrombus Aging +---------+---------------+---------+-----------+----------+--------------+ CFV      Full           Yes      Yes                                 +---------+---------------+---------+-----------+----------+--------------+ SFJ      Full                                                        +---------+---------------+---------+-----------+----------+--------------+ FV Prox  Full                                                        +---------+---------------+---------+-----------+----------+--------------+ FV Mid   Full                                                        +---------+---------------+---------+-----------+----------+--------------+ FV DistalFull                                                        +---------+---------------+---------+-----------+----------+--------------+ PFV      Full                                                        +---------+---------------+---------+-----------+----------+--------------+ POP      Full           Yes      Yes                                  +---------+---------------+---------+-----------+----------+--------------+ PTV      Full                                                        +---------+---------------+---------+-----------+----------+--------------+  PERO     Full                                                        +---------+---------------+---------+-----------+----------+--------------+   +---------+---------------+---------+-----------+----------+--------------+ LEFT     CompressibilityPhasicitySpontaneityPropertiesThrombus Aging +---------+---------------+---------+-----------+----------+--------------+ CFV      Full           Yes      Yes                                 +---------+---------------+---------+-----------+----------+--------------+ SFJ      Full                                                        +---------+---------------+---------+-----------+----------+--------------+ FV Prox  Full                                                        +---------+---------------+---------+-----------+----------+--------------+ FV Mid   Full                                                        +---------+---------------+---------+-----------+----------+--------------+ FV DistalFull                                                        +---------+---------------+---------+-----------+----------+--------------+ PFV      Full                                                        +---------+---------------+---------+-----------+----------+--------------+ POP      Full           Yes      Yes                                 +---------+---------------+---------+-----------+----------+--------------+ PTV      Full                                                        +---------+---------------+---------+-----------+----------+--------------+ PERO     Full                                                         +---------+---------------+---------+-----------+----------+--------------+  Summary: Right: There is no evidence of deep vein thrombosis in the lower extremity. No cystic structure found in the popliteal fossa. Left: There is no evidence of deep vein thrombosis in the lower extremity. No cystic structure found in the popliteal fossa.  *See table(s) above for measurements and observations. Electronically signed by Deitra Mayo MD on 02/17/2019 at 5:05:14 PM.    Final      Medical Consultants:    None.  Anti-Infectives:   IV vancomycin Rocephin and azithromycin  Subjective:    KAEDEN MESTER relates his breathing is unchanged compared to yesterday. Objective:    Vitals:   02/18/19 0022 02/18/19 0054 02/18/19 0306 02/18/19 0717  BP: (!) 78/60 (!) 108/57 140/67 112/62  Pulse:  85 90 91  Resp:  (!) 21 (!) 23 (!) 22  Temp:   98.5 F (36.9 C)   TempSrc:   Oral   SpO2:  96% (!) 87% 93%  Weight:      Height:       SpO2: 93 % O2 Flow Rate (L/min): 15 L/min FiO2 (%): 100 %   Intake/Output Summary (Last 24 hours) at 02/18/2019 0814 Last data filed at 02/18/2019 0600 Gross per 24 hour  Intake 900 ml  Output 1450 ml  Net -550 ml   Filed Weights   02/16/19 1856  Weight: 103.7 kg    Exam: General exam: In no acute distress. Respiratory system: Good air movement and diffuse crackles bilaterally Cardiovascular system: S1 & S2 heard, RRR. No JVD. Gastrointestinal system: Abdomen is nondistended, soft and nontender.  Central nervous system: Alert and oriented. No focal neurological deficits. Extremities: No pedal edema. Skin: No rashes, lesions or ulcers Psychiatry: Judgement and insight appear normal. Mood & affect appropriate.    Data Reviewed:    Labs: Basic Metabolic Panel: Recent Labs  Lab 02/12/19 0328 02/13/19 0420 02/14/19 0135 02/16/19 0947 02/17/19 0412  NA 142 140 138 140 139  K 3.4* 4.7 4.4 3.8 4.8  CL 102 105 99 102 103  CO2 24 24 23 24 25    GLUCOSE 111* 124* 75 144* 85  BUN 35* 37* 34* 30* 25*  CREATININE 1.08 1.08 1.19 1.17 1.05  CALCIUM 8.8* 8.6* 8.4* 8.6* 8.3*  MG 2.5* 2.5* 2.6*  --   --    GFR Estimated Creatinine Clearance: 78 mL/min (by C-G formula based on SCr of 1.05 mg/dL). Liver Function Tests: Recent Labs  Lab 02/12/19 0328 02/13/19 0420 02/14/19 0135 02/16/19 0947  AST 54* 47* 41 61*  ALT 90* 89* 79* 45*  ALKPHOS 81 82 94 199*  BILITOT 1.1 1.3* 0.9 0.8  PROT 7.5 7.4 6.7 7.1  ALBUMIN 3.6 3.4* 3.2* 3.4*   No results for input(s): LIPASE, AMYLASE in the last 168 hours. No results for input(s): AMMONIA in the last 168 hours. Coagulation profile No results for input(s): INR, PROTIME in the last 168 hours. COVID-19 Labs  Recent Labs    02/16/19 0947 02/17/19 0412  DDIMER >20.00* >20.00*  CRP 0.7 0.6    Lab Results  Component Value Date   SARSCOV2NAA POSITIVE (A) 02/06/2019    CBC: Recent Labs  Lab 02/12/19 0328 02/13/19 0420 02/14/19 0135 02/16/19 0947 02/17/19 0412  WBC 15.1* 11.6* 16.0* 14.8* 12.3*  NEUTROABS 13.6* 10.4* 14.9* 14.0* 11.2*  HGB 14.8 14.4 14.1 15.5 14.4  HCT 45.9 45.0 44.1 49.2 45.2  MCV 88.3 87.4 87.7 87.9 87.3  PLT 346 322 289 166 110*   Cardiac Enzymes: No results for  input(s): CKTOTAL, CKMB, CKMBINDEX, TROPONINI in the last 168 hours. BNP (last 3 results) No results for input(s): PROBNP in the last 8760 hours. CBG: Recent Labs  Lab 02/12/19 1713 02/12/19 1938 02/13/19 0715 02/13/19 1119 02/13/19 1701  GLUCAP 119* 120* 108* 118* 156*   D-Dimer: Recent Labs    02/16/19 0947 02/17/19 0412  DDIMER >20.00* >20.00*   Hgb A1c: No results for input(s): HGBA1C in the last 72 hours. Lipid Profile: No results for input(s): CHOL, HDL, LDLCALC, TRIG, CHOLHDL, LDLDIRECT in the last 72 hours. Thyroid function studies: No results for input(s): TSH, T4TOTAL, T3FREE, THYROIDAB in the last 72 hours.  Invalid input(s): FREET3 Anemia work up: No results for  input(s): VITAMINB12, FOLATE, FERRITIN, TIBC, IRON, RETICCTPCT in the last 72 hours. Sepsis Labs: Recent Labs  Lab 02/12/19 0328 02/13/19 0420 02/13/19 4403 02/14/19 0135 02/15/19 0928 02/16/19 0947 02/17/19 0412  PROCALCITON <0.10  --  <0.10 0.45 0.50  --   --   WBC 15.1* 11.6*  --  16.0*  --  14.8* 12.3*   Microbiology Recent Results (from the past 240 hour(s))  Culture, blood (Routine X 2) w Reflex to ID Panel     Status: None (Preliminary result)   Collection Time: 02/13/19  9:38 AM   Specimen: BLOOD  Result Value Ref Range Status   Specimen Description   Final    BLOOD RIGHT ARM Performed at [facility] [address] [place], [place] [address]    Special Requests   Final    BOTTLES DRAWN AEROBIC AND ANAEROBIC Blood Culture adequate volume Performed at [facility] [address], [place], [place] [address]    Culture   Final    NO GROWTH 4 DAYS Performed at [facility], [place] [address] [place], [place] [address]    Report Status PENDING  Incomplete  Culture, blood (Routine X 2) w Reflex to ID Panel     Status: None (Preliminary result)   Collection Time: 02/13/19  9:52 AM   Specimen: BLOOD  Result Value Ref Range Status   Specimen Description   Final    BLOOD RIGHT ARM Performed at [facility] [address] [place], [place] [address]    Special Requests   Final    BOTTLES DRAWN AEROBIC AND ANAEROBIC Blood Culture adequate volume Performed at [facility] [address], [place], [place] [address]    Culture   Final    NO GROWTH 4 DAYS Performed at [facility], [place] [address] [place], [place] [address]    Report Status PENDING  Incomplete  MRSA PCR Screening     Status: None   Collection Time: 02/14/19  5:00 PM   Specimen: Nasal Mucosa; Nasopharyngeal  Result Value Ref Range Status   MRSA by PCR NEGATIVE NEGATIVE Final    Comment:        The GeneXpert MRSA  Assay (FDA approved for NASAL specimens only), is one component of a comprehensive MRSA colonization surveillance program. It is not intended to diagnose MRSA infection nor to guide or monitor treatment for MRSA infections. Performed at [facility], [place] [address] [place],  [address]      Medications:   . vitamin C  500 mg Oral Daily  . aspirin EC  81 mg Oral Daily  . atorvastatin  10 mg Oral Daily  . chlorpheniramine-HYDROcodone  5 mL Oral Q12H  . docusate sodium  100 mg  Oral Daily  . enoxaparin (LOVENOX) injection  1 mg/kg Subcutaneous Q12H  . fluticasone  2 puff Inhalation BID  . Ipratropium-Albuterol  1 puff Inhalation Q6H  . metoprolol tartrate  100 mg Oral BID  . pantoprazole  40 mg Oral Daily  . terazosin  1 mg Oral QHS  . zinc sulfate  220 mg Oral Daily   Continuous Infusions: . sodium chloride        LOS: 9 days   Charlynne Cousins  [facility]  02/18/2019, 8:14 AM

## 2019-02-18 NOTE — Progress Notes (Signed)
Dr. Shanon Brow paged waiting on call back, patient BP: 78/60 manual, HR:86 on 15L HFNC and NRB

## 2019-02-18 NOTE — Progress Notes (Signed)
Called to room by Dr. Olevia Bowens, he requested that I give pt 100 Mg of Lopressor PO, he stated that the dose was held this morning and the patient has become tachycardic, current HR 118. Confirmed with Dr. Olevia Bowens the dose of 100mg  oral now, and he would still like patient to receive 2200 dose.

## 2019-02-18 NOTE — Progress Notes (Signed)
Initial Nutrition Assessment   RD working remotely.  DOCUMENTATION CODES:   Obesity unspecified  INTERVENTION:  Provide Ensure Enlive po TID, each supplement provides 350 kcal and 20 grams of protein.  Encourage adequate PO intake.   NUTRITION DIAGNOSIS:   Increased nutrient needs related to acute illness(COVID19) as evidenced by estimated needs.  GOAL:   Patient will meet greater than or equal to 90% of their needs  MONITOR:   PO intake, Supplement acceptance, Skin, Weight trends, Labs, I & O's  REASON FOR ASSESSMENT:   Malnutrition Screening Tool    ASSESSMENT:   74 y.o. male past medical history significant for squamous cell carcinoma of the lung status post chemotherapy in 2018, diabetes, morbid obesity, vitamin D deficiency, hypertension presents to [facility] with shortness of breath and acute hypoxic respiratory failure due to COVID-19 pneumonia.  He was given IV steroids, remdesivir and Actemra and transferred to [facility] on 15 L high flow nasal cannula  Pt is currently on 15 L HFNC. RD unable to obtain pt nutrition history at this time. Per weight records, pt with a 6% weight loss in 9 days. RD to order nutritional supplements to aid in caloric and protein needs.   Unable to complete Nutrition-Focused physical exam at this time.   Labs and medications reviewed.   Diet Order:   Diet Order            Diet regular Room service appropriate? Yes; Fluid consistency: Thin  Diet effective now              EDUCATION NEEDS:   Not appropriate for education at this time  Skin:  Skin Assessment: Reviewed RN Assessment  Last BM:  1/4  Height:   Ht Readings from Last 1 Encounters:  02/16/19 5\' 11"  (1.803 m)    Weight:   Wt Readings from Last 1 Encounters:  02/16/19 103.7 kg    Ideal Body Weight:  78 kg  BMI:  Body mass index is 31.88 kg/m.  Estimated Nutritional Needs:   Kcal:  2202-5427  Protein:  105-120 grams  Fluid:  >/= 2  L/day    Corrin Parker, MS, RD, LDN Pager # [phone] After hours/ weekend pager # [phone]

## 2019-02-18 NOTE — Progress Notes (Signed)
Pharmacy Antibiotic Note  [name] is a 74 y.o. male admitted on 02/08/2019 with pneumonia.  Pharmacy has been consulted for vancomycin/cefepime dosing. Patient has previously been on ceftriaxone/azithro since 12/30. Patient did receive vancomycin x 2 days this admit, last dose 12/31. Afebrile, WBC down to 12.3. SCr down to 0.9.  Plan: Cefepime 2g IV q8h Vancomycin 2g IV x1; then Vancomycin 1750 mg IV Q 24 hrs. Goal AUC 400-550. Expected AUC: 482 SCr used: 0.9 Monitor clinical progress, c/s, renal function F/u de-escalation plan/LOT, vancomycin levels as indicated   Height: 5\' 11"  (180.3 cm) Weight: 228 lb 9.6 oz (103.7 kg) IBW/kg (Calculated) : 75.3  Temp (24hrs), Avg:98 F (36.7 C), Min:97.6 F (36.4 C), Max:98.5 F (36.9 C)  Recent Labs  Lab 02/12/19 0328 02/13/19 0420 02/14/19 0135 02/16/19 0947 02/17/19 0412 02/18/19 0745  WBC 15.1* 11.6* 16.0* 14.8* 12.3*  --   CREATININE 1.08 1.08 1.19 1.17 1.05 0.90    Estimated Creatinine Clearance: 91 mL/min (by C-G formula based on SCr of 0.9 mg/dL).    No Known Allergies    Elicia Lamp, PharmD, BCPS Clinical Pharmacist 02/18/2019 9:20 AM

## 2019-02-18 NOTE — Progress Notes (Signed)
ANTICOAGULATION CONSULT NOTE - Follow Up Consult  Pharmacy Consult for Lovenox Indication: VTE treatment  No Known Allergies  Patient Measurements: Height: 5\' 11"  (180.3 cm) Weight: 228 lb 9.6 oz (103.7 kg) IBW/kg (Calculated) : 75.3 Heparin Dosing Weight:   Vital Signs: Temp: 98 F (36.7 C) (01/04 0717) Temp Source: Oral (01/04 0717) BP: 112/62 (01/04 0717) Pulse Rate: 91 (01/04 0717)  Labs: Recent Labs    02/16/19 0947 02/17/19 0412 02/18/19 0745  HGB 15.5 14.4 15.0  HCT 49.2 45.2 46.5  PLT 166 110* 103*  CREATININE 1.17 1.05 0.90    Estimated Creatinine Clearance: 91 mL/min (by C-G formula based on SCr of 0.9 mg/dL).   Medications:  Scheduled:  . vitamin C  500 mg Oral Daily  . aspirin EC  81 mg Oral Daily  . atorvastatin  10 mg Oral Daily  . chlorpheniramine-HYDROcodone  5 mL Oral Q12H  . docusate sodium  100 mg Oral Daily  . enoxaparin (LOVENOX) injection  1 mg/kg Subcutaneous Q12H  . fluticasone  2 puff Inhalation BID  . Ipratropium-Albuterol  1 puff Inhalation Q6H  . metoprolol tartrate  100 mg Oral BID  . pantoprazole  40 mg Oral Daily  . terazosin  1 mg Oral QHS  . zinc sulfate  220 mg Oral Daily   Infusions:  . ceFEPime (MAXIPIME) IV 2 g (02/18/19 1037)  . sodium chloride    . [START ON 02/19/2019] vancomycin    . vancomycin 2,000 mg (02/18/19 1150)    Assessment: 44 yoM admitted with COVID-19. PMH significant for lung cancer. Pharmacy consulted to dose Lovenox with d-dimer elevated >20. 12/27 and 1/2 repeat dopplers: negative for DVT. 12/30 CTa: negative for PE. SCr down to 0.9, Hg wnl, plt down to 103. No active bleed issues documented.  Goal of Therapy:  Anti-Xa level 0.6-1 units/ml 4hrs after LMWH dose given Monitor platelets by anticoagulation protocol: Yes   Plan:  Lovenox 1 mg/kg (105mg ) [place] q12h. Follow up renal function, CBC q72, s/s bleeding, indication.  Elicia Lamp, PharmD, BCPS Please check AMION for all [facility] contact  numbers Clinical Pharmacist 02/18/2019 1:12 PM

## 2019-02-19 LAB — BASIC METABOLIC PANEL
Anion gap: 12 (ref 5–15)
BUN: 25 mg/dL — ABNORMAL HIGH (ref 8–23)
CO2: 25 mmol/L (ref 22–32)
Calcium: 8.4 mg/dL — ABNORMAL LOW (ref 8.9–10.3)
Chloride: 102 mmol/L (ref 98–111)
Creatinine, Ser: 0.99 mg/dL (ref 0.61–1.24)
GFR calc Af Amer: >60 mL/min (ref >60)
GFR calc non Af Amer: >60 mL/min (ref >60)
Glucose, Bld: 84 mg/dL (ref 70–99)
Potassium: 4.6 mmol/L (ref 3.5–5.1)
Sodium: 139 mmol/L (ref 135–145)

## 2019-02-19 LAB — CBC WITH DIFFERENTIAL/PLATELET
Abs Immature Granulocytes: 0.09 10*3/uL — ABNORMAL HIGH (ref 0.00–0.07)
Basophils Absolute: 0.1 10*3/uL (ref 0.0–0.1)
Basophils Relative: 0 %
Eosinophils Absolute: 0.1 10*3/uL (ref 0.0–0.5)
Eosinophils Relative: 1 %
HCT: 45.6 % (ref 39.0–52.0)
Hemoglobin: 14.5 g/dL (ref 13.0–17.0)
Immature Granulocytes: 1 %
Lymphocytes Relative: 4 %
Lymphs Abs: 0.6 10*3/uL — ABNORMAL LOW (ref 0.7–4.0)
MCH: 27.8 pg (ref 26.0–34.0)
MCHC: 31.8 g/dL (ref 30.0–36.0)
MCV: 87.4 fL (ref 80.0–100.0)
Monocytes Absolute: 0.2 10*3/uL (ref 0.1–1.0)
Monocytes Relative: 1 %
Neutro Abs: 14.1 10*3/uL — ABNORMAL HIGH (ref 1.7–7.7)
Neutrophils Relative %: 93 %
Platelets: 95 10*3/uL — ABNORMAL LOW (ref 150–400)
RBC: 5.22 MIL/uL (ref 4.22–5.81)
RDW: 13.7 % (ref 11.5–15.5)
WBC: 15.1 10*3/uL — ABNORMAL HIGH (ref 4.0–10.5)
nRBC: 0 % (ref 0.0–0.2)

## 2019-02-19 LAB — D-DIMER, QUANTITATIVE: D-Dimer, Quant: 5.42 ug/mL-FEU — ABNORMAL HIGH (ref 0.00–0.50)

## 2019-02-19 LAB — C-REACTIVE PROTEIN: CRP: 1.5 mg/dL — ABNORMAL HIGH (ref <1.0)

## 2019-02-19 LAB — GLUCOSE, CAPILLARY
Glucose-Capillary: 102 mg/dL — ABNORMAL HIGH (ref 70–99)
Glucose-Capillary: 148 mg/dL — ABNORMAL HIGH (ref 70–99)
Glucose-Capillary: 134 mg/dL — ABNORMAL HIGH (ref 70–99)

## 2019-02-19 MED ORDER — METOPROLOL TARTRATE 5 MG/5ML IV SOLN
5.0000 mg | Freq: Four times a day (QID) | INTRAVENOUS | Status: DC | PRN
  Administered 2019-02-19 (×2): 5 mg via INTRAVENOUS
  Filled 2019-02-19: qty 5

## 2019-02-19 NOTE — Progress Notes (Addendum)
0128-DrShanon Brow paged awaiting call back small amount of blood coming from patient penis, a[pproximately 2cc in condom catheter collection bag,  0600-Patient urine is tea colored; 400cc emptied, he denies any pain or burning with urinating. Dr. Shanon Brow did not call back nor did she put in orders.

## 2019-02-19 NOTE — Progress Notes (Addendum)
0350- call from tele regarding patient desat into the 24s. Went into room and found patient sitting on side of bed and fumbling with his [place] and mask. Placed equipment back on and remained at patient's bedside until sats improved.   1945 - This nurse has remained at bedside since first coming in. Patient continued to have episodes of desatting into the 60's despite self proning and being flushed on HFNC and NRB. Agricultural consultant and RT notified. Orders were placed for patient to be on [place] prior to shift change. RT came into room, and at that time the patient was comfortable and sats improved. Discussion had and due to limited equipment decision was made to see how patient does without HHFNC. Will notify RT if patient decompensates again.   2050 - received multiple calls while giving care to another patient regarding sats, other RNs went to assist. This writer notified RT and she came to bedside to place on [place]. See her note. This writer came to bedside asap and remained at bedside until patient improved and comfortable.   2150 - sats maintained in the upper 80's to low 90's. HS meds given. No complaints at this time. Patient informed to not get OOB or sit up to urinate without assistance due to breathing. Bed alarm in place and call bell in hand   0400 - patient had multiple episodes of removing his oxygen, attempted to get OOB, increase agitation, increase WOB, RR remained in the 30-50's for majority of the night. MD made aware, received order for sitter and ativan 2mg  IV one time dose. Ativan given slowly over several minutes while this writer remained at bedside monitoring RR and sats very closely. Patient has remained proned throughout the night, will desat with minimal exertion. Offered to call patient's wife during the night to update her on his condition, but patient stated to wait until morning and let her rest tonight. Will remain at bedside until sitter arrives and continue to closely monitor.   0500  -0700 - patient had an immediate change in status. Woke up, pulled off oxygen, diaphoretic, sats remained in 40's.  RT, and ICU Charge RN at the bedside. HR in 120's sats in the 60's, RR in the 50's, use of accessory muscles and patient continues to pull of oxygen stating he was done and didn't want to wear it. Dr. Shanon Brow paged, ordered for haldol and ativan. meds given, see MAR, and stated to call family and move to ICU. Patient continues to decompensate. Family called, placed on speaker for other nurses to hear conversation, to update on patient's condition after long discussion about patient's condition, family requested to not escalate care and make him comfortable. The family (Ms. Izell [place] and his daughter) also stated they want to respect his wishes to not intubate and DNR and that he would not want to be on a ventilator or suffer. Family visit arranged, patient's significant other and daughter were able to visit with him and he returned to room. Patient in increase distress upon return and increase agitation. HR in the 120's and sats in the 70's. Currently on HFNC and NRB. No PRN medications to give. Dr. Shanon Brow paged to inform despite being on comfort measure, the patient was in distress and request for increase in frequency of morphine dosage. No return page.   0938 - Dr. [place] Pink paged and informed the patient was in distress. Oncoming nurse given report. At shift change MD had not returned call. Oncoming nurse states she will try  to call the MD back regarding PRN medications.

## 2019-02-19 NOTE — Plan of Care (Signed)
Patient remained on 15L NRB and 10-15L HF. Pt would desat to 76% to 95% throughout the day. PRN metoprolol given twice for elevated pulse. Pt express anxiety when attempt any type of movement which cause hypoxia, pulm. Toileting provided, pt given PRN morphine. Pt breathing comfortable after admin. Notified MD regarding new onset of bleeding in external cath. Will continue to monitor.  Problem: Education: Goal: Knowledge of risk factors and measures for prevention of condition will improve Outcome: Progressing   Problem: Coping: Goal: Psychosocial and spiritual needs will be supported Outcome: Progressing   Problem: Respiratory: Goal: Will maintain a patent airway Outcome: Progressing Goal: Complications related to the disease process, condition or treatment will be avoided or minimized Outcome: Progressing

## 2019-02-19 NOTE — Progress Notes (Signed)
Attempted to take pt off NRB but pt began having a "panic attack" and wanted it back on

## 2019-02-19 NOTE — Progress Notes (Signed)
[facility] PROGRESS NOTE    Progress Note  [name]  ZHY:[id number] DOB: 05-30-1945 DOA: 02/10/2019 PCP: Glean Hess, MD     Brief Narrative:   [name] is an 74 y.o. male past medical history significant for squamous cell carcinoma of the lung status post chemotherapy in 2018, diabetes, morbid obesity, vitamin D deficiency, hypertension presents to [place] with shortness of breath and acute hypoxic respiratory failure due to COVID-19 pneumonia.  He was given IV steroids, remdesivir and Actemra and transferred to [facility] on 15 L high flow nasal cannula  Assessment/Plan:   Acute hypoxemic respiratory failure due to severe acute respiratory syndrome coronavirus 2 (SARS-CoV-2) disease (HCC) and bacterial PNA: [name] is still requiring 15 L of oxygen to keep saturations greater than 91%. He has completed 5-day course of IV remdesivir and a 10-day course of steroids.  He did receive Actemra on 02/12/2019 He continues to do a great job of proning, when not prone out of bed to chair, continues into spirometry and flutter valve. He had been placed on Rocephin and azithromycin due to worsening saturations and a new lobar infiltrate seen on CT, his antibiotic coverage was broadened due to rising leukocytosis.  He has been afebrile and saturations have remained stable. His D-dimer has increased greater than 20 twice, 01/22/2019 work-up with a lower extremity Doppler was negative for DVT and CT angio showed no thromboembolic events, and a second time on 02/15/2018 which is lower extremity Doppler again did not show a DVT, both times he was placed on full dose anticoagulation.  For the initial episode he was treated with 5-day course of Lovenox and then taper down to subtherapeutic dose of lovenox. On 02/15/2018 with a second rise in his D-dimer he was placed on full dose anticoagulation with Lovenox and he has remained on full dose till today, would recommend keeping him  on full dose anticoagulation for the next several days.  New small pneumomediastinum: On 02/13/2019 his saturations got worse, he became tachycardic and complaining of chest pain a CT angio of the chest was done which was negative for PE but it did show a new lobar pneumonia and new pneumomediastinum that was treated conservatively with Robitussin and Tussionex and proning, repeat a chest x-ray interval improvement of his pneumomediastinum is now resolved. We will continue incentive spirometry and flutter valve.  New sinus tachycardia: Developes intermittent sinus tachycardia, continue metoprolol orally and IV as needed.  History of squamous cell carcinoma of the lung: Continue outpatient treatment with oncologist.  Obesity: Follow-up with PCP with a BMI of 34.  GERD: Continue PPI.  Essential hypertension: Seems to be fairly controlled, mildly tachycardic this morning we will continue to monitor closely.  Elevated D-dimer: His D-dimer has improved to 8.7, it has previously been greater than 20 for 2 days, will continue on full dose anticoagulation.  Asymptomatic elevated LFTs: Elevated LFTs likely due to COVID-19.  Hypokalemia: Repleted.  Ethics/palliative care advance goals of care: He remains a DNR/DNI.  New hematuria: Minimal has a condom Foley, will continue to monitor closely.  Hemoglobin is stable.  He denies any pain or dysuria.   DVT prophylaxis: lovenox Family Communication: Disposition Plan/Barrier to D/C: unable to determine Code Status:DNR   IV Access:    Peripheral IV   Procedures and diagnostic studies:   DG CHEST PORT 1 VIEW  Result Date: 02/18/2019 CLINICAL DATA:  Dyspnea.  Lung cancer history EXAM: PORTABLE CHEST 1 VIEW COMPARISON:  02/17/2019 FINDINGS:  Mild progression of right upper lobe and right lower lobe airspace disease, probable pneumonia. Left lower lobe atelectasis/infiltrate unchanged. Radiation scarring left upper lobe unchanged.  Port-A-Cath tip in the upper SVC with coil in the jugular vein as noted previously. No pleural effusion IMPRESSION: Bilateral airspace disease, with mild progression on the right. Probable pneumonia. Electronically Signed   By: Franchot Gallo M.D.   On: 02/18/2019 08:01   VAS [place] LOWER EXTREMITY VENOUS (DVT)  Result Date: 02/17/2019  Lower Venous Study Indications: Elevated Ddimer.  Risk Factors: COVID 19 positive. Comparison Study: 02/10/2019 - Negative for DVT. Performing Technologist: Oliver Hum RVT  Examination Guidelines: A complete evaluation includes B-mode imaging, spectral Doppler, color Doppler, and power Doppler as needed of all accessible portions of each vessel. Bilateral testing is considered an integral part of a complete examination. Limited examinations for reoccurring indications may be performed as noted.  +---------+---------------+---------+-----------+----------+--------------+ RIGHT    CompressibilityPhasicitySpontaneityPropertiesThrombus Aging +---------+---------------+---------+-----------+----------+--------------+ CFV      Full           Yes      Yes                                 +---------+---------------+---------+-----------+----------+--------------+ SFJ      Full                                                        +---------+---------------+---------+-----------+----------+--------------+ FV Prox  Full                                                        +---------+---------------+---------+-----------+----------+--------------+ FV Mid   Full                                                        +---------+---------------+---------+-----------+----------+--------------+ FV DistalFull                                                        +---------+---------------+---------+-----------+----------+--------------+ PFV      Full                                                         +---------+---------------+---------+-----------+----------+--------------+ POP      Full           Yes      Yes                                 +---------+---------------+---------+-----------+----------+--------------+ PTV      Full                                                        +---------+---------------+---------+-----------+----------+--------------+  PERO     Full                                                        +---------+---------------+---------+-----------+----------+--------------+   +---------+---------------+---------+-----------+----------+--------------+ LEFT     CompressibilityPhasicitySpontaneityPropertiesThrombus Aging +---------+---------------+---------+-----------+----------+--------------+ CFV      Full           Yes      Yes                                 +---------+---------------+---------+-----------+----------+--------------+ SFJ      Full                                                        +---------+---------------+---------+-----------+----------+--------------+ FV Prox  Full                                                        +---------+---------------+---------+-----------+----------+--------------+ FV Mid   Full                                                        +---------+---------------+---------+-----------+----------+--------------+ FV DistalFull                                                        +---------+---------------+---------+-----------+----------+--------------+ PFV      Full                                                        +---------+---------------+---------+-----------+----------+--------------+ POP      Full           Yes      Yes                                 +---------+---------------+---------+-----------+----------+--------------+ PTV      Full                                                         +---------+---------------+---------+-----------+----------+--------------+ PERO     Full                                                        +---------+---------------+---------+-----------+----------+--------------+  Summary: Right: There is no evidence of deep vein thrombosis in the lower extremity. No cystic structure found in the popliteal fossa. Left: There is no evidence of deep vein thrombosis in the lower extremity. No cystic structure found in the popliteal fossa.  *See table(s) above for measurements and observations. Electronically signed by Deitra Mayo MD on 02/17/2019 at 5:05:14 PM.    Final      Medical Consultants:    None.  Anti-Infectives:   IV vancomycin Rocephin and azithromycin  Subjective:    [name] no complaints except for feeling extremely tired. Objective:    Vitals:   02/18/19 2141 02/18/19 2200 02/19/19 0105 02/19/19 0503  BP: 117/65  136/69 133/77  Pulse: (!) 105 100 94   Resp:  (!) 25 (!) 25 (!) 27  Temp:   98.5 F (36.9 C) 97.6 F (36.4 C)  TempSrc:      SpO2:  99% (!) 89% 95%  Weight:      Height:       SpO2: 95 % O2 Flow Rate (L/min): (10HFNC/15NRB) FiO2 (%): 100 %   Intake/Output Summary (Last 24 hours) at 02/19/2019 0743 Last data filed at 02/19/2019 0507 Gross per 24 hour  Intake 1540 ml  Output 2150 ml  Net -610 ml   Filed Weights   02/16/19 1856  Weight: 103.7 kg    Exam: General exam: In no acute distress. Respiratory system: Good air movement and lungs seem to be clearing, cannot appreciate crackles. Cardiovascular system: S1 & S2 heard, RRR. No JVD. Gastrointestinal system: Abdomen is nondistended, soft and nontender.  Central nervous system: Alert and oriented. No focal neurological deficits. Extremities: No pedal edema. Skin: No rashes, lesions or ulcers Psychiatry: Judgement and insight appear normal. Mood & affect appropriate.    Data Reviewed:    Labs: Basic Metabolic Panel: Recent  Labs  Lab 02/13/19 0420 02/14/19 0135 02/16/19 0947 02/17/19 0412 02/18/19 0745  NA 140 138 140 139 138  K 4.7 4.4 3.8 4.8 4.2  CL 105 99 102 103 100  CO2 24 23 24 25 26   GLUCOSE 124* 75 144* 85 101*  BUN 37* 34* 30* 25* 23  CREATININE 1.08 1.19 1.17 1.05 0.90  CALCIUM 8.6* 8.4* 8.6* 8.3* 8.4*  MG 2.5* 2.6*  --   --   --    GFR Estimated Creatinine Clearance: 91 mL/min (by C-G formula based on SCr of 0.9 mg/dL). Liver Function Tests: Recent Labs  Lab 02/13/19 0420 02/14/19 0135 02/16/19 0947  AST 47* 41 61*  ALT 89* 79* 45*  ALKPHOS 82 94 199*  BILITOT 1.3* 0.9 0.8  PROT 7.4 6.7 7.1  ALBUMIN 3.4* 3.2* 3.4*   No results for input(s): LIPASE, AMYLASE in the last 168 hours. No results for input(s): AMMONIA in the last 168 hours. Coagulation profile No results for input(s): INR, PROTIME in the last 168 hours. COVID-19 Labs  Recent Labs    02/16/19 0947 02/17/19 0412 02/18/19 0745  DDIMER >20.00* >20.00* 8.78*  CRP 0.7 0.6 0.6    Lab Results  Component Value Date   SARSCOV2NAA POSITIVE (A) 02/06/2019    CBC: Recent Labs  Lab 02/13/19 0420 02/14/19 0135 02/16/19 0947 02/17/19 0412 02/18/19 0745  WBC 11.6* 16.0* 14.8* 12.3* 12.5*  NEUTROABS 10.4* 14.9* 14.0* 11.2* 11.6*  HGB 14.4 14.1 15.5 14.4 15.0  HCT 45.0 44.1 49.2 45.2 46.5  MCV 87.4 87.7 87.9 87.3 88.1  PLT 322 289 166 110* 103*   Cardiac Enzymes:  No results for input(s): CKTOTAL, CKMB, CKMBINDEX, TROPONINI in the last 168 hours. BNP (last 3 results) No results for input(s): PROBNP in the last 8760 hours. CBG: Recent Labs  Lab 02/12/19 1713 02/12/19 1938 02/13/19 0715 02/13/19 1119 02/13/19 1701  GLUCAP 119* 120* 108* 118* 156*   D-Dimer: Recent Labs    02/17/19 0412 02/18/19 0745  DDIMER >20.00* 8.78*   Hgb A1c: No results for input(s): HGBA1C in the last 72 hours. Lipid Profile: No results for input(s): CHOL, HDL, LDLCALC, TRIG, CHOLHDL, LDLDIRECT in the last 72  hours. Thyroid function studies: No results for input(s): TSH, T4TOTAL, T3FREE, THYROIDAB in the last 72 hours.  Invalid input(s): FREET3 Anemia work up: No results for input(s): VITAMINB12, FOLATE, FERRITIN, TIBC, IRON, RETICCTPCT in the last 72 hours. Sepsis Labs: Recent Labs  Lab 02/13/19 9924 02/14/19 0135 02/15/19 0928 02/16/19 0947 02/17/19 0412 02/18/19 0745  PROCALCITON <0.10 0.45 0.50  --   --   --   WBC  --  16.0*  --  14.8* 12.3* 12.5*   Microbiology Recent Results (from the past 240 hour(s))  Culture, blood (Routine X 2) w Reflex to ID Panel     Status: None   Collection Time: 02/13/19  9:38 AM   Specimen: BLOOD  Result Value Ref Range Status   Specimen Description   Final    BLOOD RIGHT ARM Performed at [facility] [address], [place], [place] [address]    Special Requests   Final    BOTTLES DRAWN AEROBIC AND ANAEROBIC Blood Culture adequate volume Performed at [facility] [address] [place], [place] [address]    Culture   Final    NO GROWTH 5 DAYS Performed at [facility], [place] [address], [place], [place] [address]    Report Status 02/18/2019 FINAL  Final  Culture, blood (Routine X 2) w Reflex to ID Panel     Status: None   Collection Time: 02/13/19  9:52 AM   Specimen: BLOOD  Result Value Ref Range Status   Specimen Description   Final    BLOOD RIGHT ARM Performed at [facility] [address], [place], [place] [address]    Special Requests   Final    BOTTLES DRAWN AEROBIC AND ANAEROBIC Blood Culture adequate volume Performed at [facility] [address] [place], [place] [address]    Culture   Final    NO GROWTH 5 DAYS Performed at [facility], [place] [address], [place], [place] [address]    Report Status 02/18/2019 FINAL  Final  MRSA PCR Screening     Status: None   Collection Time: 02/14/19  5:00 PM   Specimen: Nasal Mucosa;  Nasopharyngeal  Result Value Ref Range Status   MRSA by PCR NEGATIVE NEGATIVE Final    Comment:        The GeneXpert MRSA Assay (FDA approved for NASAL specimens only), is one component of a comprehensive MRSA colonization surveillance program. It is not intended to diagnose MRSA infection nor to guide or monitor treatment for MRSA infections. Performed at [facility], [place] [address], [place], [place] [address]      Medications:   . vitamin C  500 mg Oral Daily  . aspirin EC  81 mg Oral Daily  . atorvastatin  10 mg Oral Daily  . chlorpheniramine-HYDROcodone  5 mL Oral Q12H  . docusate sodium  100 mg Oral Daily  .  enoxaparin (LOVENOX) injection  1 mg/kg Subcutaneous Q12H  . feeding supplement (ENSURE ENLIVE)  237 mL Oral TID BM  . fluticasone  2 puff Inhalation BID  . Ipratropium-Albuterol  1 puff Inhalation Q6H  . metoprolol tartrate  100 mg Oral BID  . pantoprazole  40 mg Oral Daily  . terazosin  1 mg Oral QHS  . zinc sulfate  220 mg Oral Daily   Continuous Infusions: . ceFEPime (MAXIPIME) IV 2 g (02/19/19 0507)  . sodium chloride    . vancomycin        LOS: 10 days   Charlynne Cousins  [facility]  02/19/2019, 7:43 AM

## 2019-02-19 NOTE — Progress Notes (Signed)
RT called earlier to place pt on HHFNC d/t desat and increase WOB.  RT arrived and pt was looking better with sat of 94% and RR at 18-20 on 15LPM NRB and15L [place] and self proning.  Pt looked as if he was improving so RT made decision to hold off on [place] but left it at bedside.  At this time, RT called again d/t pt dropping sat to 70s again.  Pt had been found on side of bed with or without his cannula and struggling to breathe. Pt was at this time placed on [facility] and pt was told he needed to remain proning and not to get out of bed without support in room with him.  RT will continue to monitor.  Pt looks less labored at this time and is still on his belly on settings charted here.

## 2019-02-19 NOTE — Progress Notes (Signed)
Updated wife on poc. Questions and concerns answered   02/19/19 1128  Family/Significant Other Communication  Family/Significant Other Update Called;Updated

## 2019-02-19 NOTE — Progress Notes (Signed)
Physical Therapy Treatment Patient Details Name: [name] MRN: [id number] DOB: [birth year] Today's Date: 02/19/2019    History of Present Illness Brief Narrative  [name]  is a 74 y.o. male,73 y.o. male with medical history significant of squamous cell carcinoma of the lung status post treatment since 2018, BPH, prediabetic, morbid obesity, vitamin D deficiency, vitamin B-12 deficiency, HTN, GERD, CAD who presented to [facility] with shortness of breath was diagnosed with acute hypoxic respiratory failure due to COVID-19 pneumonia.  Was appropriately treated with IV steroids, remdesivir and Actemra thereafter transferred to [facility] hospital on 15 L high flow nasal cannula oxygen.    PT Comments    Very pleasant patient that indicates he is exhausted today. Per RN he had had a really "had a rough day", and advised to have a "light session". Abbreviated session focused on reviewing HEP in conjunction with the printed sheet /theraband issued at time of EVAL. Should continue to benefit from PT during hospitalization to address strengthening, enduranced, and safety in all out of bed mobility to reduce risk of falls.    Follow Up Recommendations  Home health PT     Equipment Recommendations  None recommended by PT    Recommendations for Other Services       Precautions / Restrictions Precautions Precautions: Fall    Mobility  Bed Mobility                  Transfers                    Ambulation/Gait                 Stairs             Wheelchair Mobility    Modified Rankin (Stroke Patients Only)       Balance Overall balance assessment: Mild deficits observed, not formally tested                                          Cognition Arousal/Alertness: Awake/alert Behavior During Therapy: (states he was fatigued- and really did not feel like doing very much. Was very focused on his oxygen levels.) Overall Cognitive  Status: Within Functional Limits for tasks assessed                                        Exercises General Exercises - Lower Extremity Ankle Circles/Pumps: AROM(Reviewed and reminded to perform in between sessions) Hip ABduction/ADduction: AROM(reviewed and reminded to perform in between sessions) Other Exercises Other Exercises: Reminded to perform UE, LE ex as outlined in printed HEP. He is still hesitent to use either IS or Flutter, and states his physician "does not want him to"    General Comments General comments (skin integrity, edema, etc.): He was hindered by fatigue, and not able to tolerate except for reminders of the IS and Flutter, as well as LE strengthening and ROM. Primarily instructions and review with little activie participation on his part. O2 sats running 75-80, then with proper breathing would increase to 88-90%      Pertinent Vitals/Pain      Home Living Family/patient expects to be discharged to:: Private residence Living Arrangements: Spouse/significant other Available Help at Discharge: Family;Available 24 hours/day Type of Home: House Home Access:  Level entry Entrance Stairs-Rails: Right Home Layout: One level        Prior Function            PT Goals (current goals can now be found in the care plan section) Acute Rehab PT Goals Patient Stated Goal: Just indicates that he wants to go home and be with his family- but be able to do as much to help himself as is possible. PT Goal Formulation: With patient Time For Goal Achievement: 02/25/19 Potential to Achieve Goals: (Very complex medical diagnoses)    Frequency    Min 3X/week      PT Plan      Co-evaluation              AM-PAC PT "6 Clicks" Mobility   Outcome Measure  Help needed turning from your back to your side while in a flat bed without using bedrails?: A Little Help needed moving from lying on your back to sitting on the side of a flat bed without using  bedrails?: A Little Help needed moving to and from a bed to a chair (including a wheelchair)?: A Little Help needed standing up from a chair using your arms (e.g., wheelchair or bedside chair)?: A Little Help needed to walk in hospital room?: A Little Help needed climbing 3-5 steps with a railing? : A Lot 6 Click Score: 17    End of Session         PT Visit Diagnosis: Muscle weakness (generalized) (M62.81);Unsteadiness on feet (R26.81)     Time: 4580-9983 PT Time Calculation (min) (ACUTE ONLY): 14 min  Charges:  $Therapeutic Exercise: 8-22 mins          [name], PT # [phone] CGV cell      [name] 02/19/2019, 3:31 PM

## 2019-02-19 NOTE — Progress Notes (Signed)
MD Olevia Bowens at bedside, inquired on holding Lovenox due to new onset bleeding from external cath. MD stated, "do not hold, give Lovenox". MD requested prn metoprolol to be given. Writer informed MD PO dose of metoprolol was just given. He stated, "that's fine give IV metoprolol, we want his heart rate below 100." Writer to carryout order.

## 2019-02-20 LAB — CBC WITH DIFFERENTIAL/PLATELET
Abs Immature Granulocytes: 0.1 10*3/uL — ABNORMAL HIGH (ref 0.00–0.07)
Basophils Absolute: 0.1 10*3/uL (ref 0.0–0.1)
Basophils Relative: 0 %
Eosinophils Absolute: 0 10*3/uL (ref 0.0–0.5)
Eosinophils Relative: 0 %
HCT: 41.8 % (ref 39.0–52.0)
Hemoglobin: 13.6 g/dL (ref 13.0–17.0)
Immature Granulocytes: 1 %
Lymphocytes Relative: 4 %
Lymphs Abs: 0.6 10*3/uL — ABNORMAL LOW (ref 0.7–4.0)
MCH: 28.3 pg (ref 26.0–34.0)
MCHC: 32.5 g/dL (ref 30.0–36.0)
MCV: 86.9 fL (ref 80.0–100.0)
Monocytes Absolute: 0.2 10*3/uL (ref 0.1–1.0)
Monocytes Relative: 2 %
Neutro Abs: 14.5 10*3/uL — ABNORMAL HIGH (ref 1.7–7.7)
Neutrophils Relative %: 93 %
Platelets: 81 10*3/uL — ABNORMAL LOW (ref 150–400)
RBC: 4.81 MIL/uL (ref 4.22–5.81)
RDW: 13.6 % (ref 11.5–15.5)
WBC: 15.5 10*3/uL — ABNORMAL HIGH (ref 4.0–10.5)
nRBC: 0 % (ref 0.0–0.2)

## 2019-02-20 LAB — C-REACTIVE PROTEIN: CRP: 2.2 mg/dL — ABNORMAL HIGH (ref <1.0)

## 2019-02-20 LAB — BASIC METABOLIC PANEL
Anion gap: 13 (ref 5–15)
BUN: 34 mg/dL — ABNORMAL HIGH (ref 8–23)
CO2: 22 mmol/L (ref 22–32)
Calcium: 8.2 mg/dL — ABNORMAL LOW (ref 8.9–10.3)
Chloride: 105 mmol/L (ref 98–111)
Creatinine, Ser: 1.11 mg/dL (ref 0.61–1.24)
GFR calc Af Amer: >60 mL/min (ref >60)
GFR calc non Af Amer: >60 mL/min (ref >60)
Glucose, Bld: 106 mg/dL — ABNORMAL HIGH (ref 70–99)
Potassium: 4.7 mmol/L (ref 3.5–5.1)
Sodium: 140 mmol/L (ref 135–145)

## 2019-02-20 LAB — D-DIMER, QUANTITATIVE: D-Dimer, Quant: 5.87 ug/mL-FEU — ABNORMAL HIGH (ref 0.00–0.50)

## 2019-02-20 MED ORDER — HALOPERIDOL LACTATE 5 MG/ML IJ SOLN: 0.5000 mg | INTRAMUSCULAR | Status: DC | PRN

## 2019-02-20 MED ORDER — MORPHINE SULFATE (PF) 2 MG/ML IV SOLN
4.0000 mg | INTRAVENOUS | Status: DC | PRN
  Administered 2019-02-20: 4 mg via INTRAVENOUS
  Filled 2019-02-20: qty 2

## 2019-02-20 MED ORDER — ONDANSETRON HCL 4 MG/2ML IJ SOLN: 4.0000 mg | Freq: Four times a day (QID) | INTRAMUSCULAR | Status: DC | PRN

## 2019-02-20 MED ORDER — HALOPERIDOL LACTATE 5 MG/ML IJ SOLN
5.0000 mg | Freq: Four times a day (QID) | INTRAMUSCULAR | Status: DC | PRN
  Administered 2019-02-20: 5 mg via INTRAVENOUS
  Filled 2019-02-20: qty 1

## 2019-02-20 MED ORDER — BIOTENE DRY MOUTH MT LIQD: 15.0000 mL | OROMUCOSAL | Status: DC | PRN

## 2019-02-20 MED ORDER — CHLORHEXIDINE GLUCONATE CLOTH 2 % EX PADS: 6.0000 | MEDICATED_PAD | Freq: Every day | CUTANEOUS | Status: DC

## 2019-02-20 MED ORDER — GLYCOPYRROLATE 0.2 MG/ML IJ SOLN: 0.2000 mg | INTRAMUSCULAR | Status: DC | PRN

## 2019-02-20 MED ORDER — HALOPERIDOL 0.5 MG PO TABS
0.5000 mg | ORAL_TABLET | ORAL | Status: DC | PRN
  Filled 2019-02-20: qty 1

## 2019-02-20 MED ORDER — GLYCOPYRROLATE 1 MG PO TABS
1.0000 mg | ORAL_TABLET | ORAL | Status: DC | PRN
  Filled 2019-02-20: qty 1

## 2019-02-20 MED ORDER — LORAZEPAM 2 MG/ML PO CONC: 1.0000 mg | ORAL | Status: DC | PRN

## 2019-02-20 MED ORDER — LORAZEPAM 2 MG/ML IJ SOLN
INTRAMUSCULAR | Status: AC
  Administered 2019-02-20: 2 mg via INTRAVENOUS
  Filled 2019-02-20: qty 1

## 2019-02-20 MED ORDER — ONDANSETRON 4 MG PO TBDP: 4.0000 mg | ORAL_TABLET | Freq: Four times a day (QID) | ORAL | Status: DC | PRN

## 2019-02-20 MED ORDER — LORAZEPAM 1 MG PO TABS: 1.0000 mg | ORAL_TABLET | ORAL | Status: DC | PRN

## 2019-02-20 MED ORDER — HALOPERIDOL LACTATE 2 MG/ML PO CONC
0.5000 mg | ORAL | Status: DC | PRN
  Filled 2019-02-20: qty 0.3

## 2019-02-20 MED ORDER — LORAZEPAM 2 MG/ML IJ SOLN: 2.0000 mg | Freq: Once | INTRAMUSCULAR | Status: AC

## 2019-02-20 MED ORDER — POLYVINYL ALCOHOL 1.4 % OP SOLN
1.0000 [drp] | Freq: Four times a day (QID) | OPHTHALMIC | Status: DC | PRN
  Filled 2019-02-20: qty 15

## 2019-02-20 MED ORDER — LORAZEPAM 2 MG/ML IJ SOLN: 1.0000 mg | INTRAMUSCULAR | Status: DC | PRN

## 2019-02-20 MED ORDER — LORAZEPAM 2 MG/ML IJ SOLN
2.0000 mg | Freq: Once | INTRAMUSCULAR | Status: AC
  Administered 2019-02-20: 2 mg via INTRAVENOUS
  Filled 2019-02-20: qty 1

## 2019-03-18 NOTE — Death Summary Note (Addendum)
Death Summary  [name] KGM:[id number] DOB: 1946-01-12 DOA: February 20, 2019  PCP: Glean Hess, MD  Admit date: 03/06/2019 Date of Death: 03-03-19 Time of Death: 8:00 Notification: Glean Hess, MD notified of death of 03-04-2019   History of present illness:  74 y.o. male past medical history significant for squamous cell carcinoma of the lung status post chemotherapy in 2018, diabetes, morbid obesity, vitamin D deficiency, hypertension presents to [place] with shortness of breath and acute hypoxic respiratory failure due to COVID-19 pneumonia.  He was given IV steroids, remdesivir and Actemra and transferred to [facility] on 15 L high flow nasal cannula he had a tumultuous hospital stay requiring at least 15 L of oxygen and intermittent heated high flow to keep saturations greater than 90%.  He developed normal mediastinum and a bacterial infection which was treated successfully with empiric antibiotics, he had a long hospital stay and it was hard to wean off the oxygen due to his ARDS and bacterial pneumonia.  Around 7 PM on 03/01/18 he started dropping his sats, became confused and agitated the nurse called family and the family requested not to escalate care comfortable, he was started on a morphine drip and he passed away on 2018/03/01.  Final Diagnoses:  1.  Acute respiratory failure with hypoxia due to ARDS bacterial pneumonia and Covid pneumonia New small pneumomediastinum Sinus tachycardia History of squamous cell carcinoma of the lung Obesity GERD next essential hypertension accelerated D-dimer    The results of significant diagnostics from this hospitalization (including imaging, microbiology, ancillary and laboratory) are listed below for reference.    Significant Diagnostic Studies: DG Chest 2 View  Result Date: 02/04/2019 CLINICAL DATA:  74 year old male with fever and cough. EXAM: CHEST - 2 VIEW COMPARISON:  Chest radiograph dated 10/20/2017.  FINDINGS: Right-sided Port-A-Cath in similar position. There is an area of density in the left upper lobe similar to prior radiograph, likely scarring. No consolidative changes. There is no pleural effusion or pneumothorax. Stable cardiac silhouette. No acute osseous pathology. IMPRESSION: 1. No acute cardiopulmonary process. 2. Stable left upper lobe scarring. Electronically Signed   By: Anner Crete M.D.   On: 02/04/2019 18:47   CT CHEST W CONTRAST  Result Date: 02/06/2019 CLINICAL DATA:  Squamous cell carcinoma along. Evaluate treatment response. Fever and cough. EXAM: CT CHEST, ABDOMEN, AND PELVIS WITH CONTRAST TECHNIQUE: Multidetector CT imaging of the chest, abdomen and pelvis was performed following the standard protocol during bolus administration of intravenous contrast. CONTRAST:  136mL OMNIPAQUE IOHEXOL 300 MG/ML  SOLN COMPARISON:  CT 12/25/2018 FINDINGS: CT CHEST FINDINGS Cardiovascular: Port in the anterior chest wall with tip in distal SVC. Coronary artery calcification and aortic atherosclerotic calcification. Mediastinum/Nodes: No axillary supraclavicular adenopathy. No mediastinal hilar adenopathy. No pericardial effusion. Esophagus normal. Lungs/Pleura: Band of angular consolidation with air bronchograms in LEFT upper lobe unchanged from prior consistent with radiation treatment effect. There is increased ground-glass opacities superimposed on centrilobular emphysema in the RIGHT upper lobe and posterior aspect of the LEFT lower lobe. No suspicious nodularity. No focal consolidation. There is diffuse ground-glass opacities in the lower lobes additionally. Musculoskeletal: Low-density lesion in the RIGHT hepatic lobe consistent benign cysts. Normal gallbladder. CT ABDOMEN AND PELVIS FINDINGS Hepatobiliary: No focal hepatic lesion. No biliary ductal dilatation. Gallbladder is normal. Common bile duct is normal. Pancreas: Pancreas is normal. No ductal dilatation. No pancreatic inflammation.  Spleen: Normal spleen Adrenals/urinary tract: Adrenal glands normal. Low-density lesions in the kidneys likely represent benign cysts. Ureters  and bladder normal. Stomach/Bowel: Stomach, small bowel, appendix, and cecum are normal. The colon and rectosigmoid colon are normal. Vascular/Lymphatic: Abdominal aorta is normal caliber with atherosclerotic calcification. There is no retroperitoneal or periportal lymphadenopathy. No pelvic lymphadenopathy. Reproductive: Prostate normal Other: No peritoneal metastasis Musculoskeletal: No aggressive osseous lesion. IMPRESSION: Chest Impression: 1. New bilateral ground-glass opacities within LEFT and RIGHT lung are nonspecific with broad differential including pulmonary edema, drug reaction, atypical infection or inflammation. 2. Angular consolidation in the LEFT upper lobe consistent radiation treatment. 3. No evidence of lung cancer recurrence. Abdomen / Pelvis Impression: 1. No evidence of metastatic disease in the abdomen pelvis. 2. Benign appearing cysts of the kidneys and liver. 3.  Aortic Atherosclerosis (ICD10-I70.0). Electronically Signed   By: Suzy Bouchard M.D.   On: 02/06/2019 17:41   CT ANGIO CHEST PE W OR WO CONTRAST  Result Date: 02/13/2019 CLINICAL DATA:  Dyspnea on exertion EXAM: CT ANGIOGRAPHY CHEST WITH CONTRAST TECHNIQUE: Multidetector CT imaging of the chest was performed using the standard protocol during bolus administration of intravenous contrast. Multiplanar CT image reconstructions and MIPs were obtained to evaluate the vascular anatomy. CONTRAST:  67mL OMNIPAQUE IOHEXOL 350 MG/ML SOLN COMPARISON:  Seven days ago FINDINGS: Cardiovascular: Normal heart size. No pericardial effusion. There is aortic and coronary atherosclerotic calcification. No pulmonary artery filling defects. Mediastinum/Nodes: Mild upper pneumomediastinum that is new. Lungs/Pleura: Patchy ground-glass opacity in the bilateral lungs in keeping with history of COVID-19. The  opacity is asymmetrically dense at the medial left base. There is left perihilar radiation fibrosis without worrisome masslike characteristics. No effusion or edema. Upper Abdomen: Renal and hepatic cystic densities. Musculoskeletal: Spondylosis. These results will be called to the ordering clinician or representative by the Radiologist Assistant, and communication documented in the PACS or zVision Dashboard. Review of the MIP images confirms the above findings. IMPRESSION: 1. COVID pneumonia pattern with progression from study 7 days ago. There is asymmetric dense segmental involvement in the left lower lobe, question superimposed bacterial pneumonia. 2. Mild pneumomediastinum that is new from prior. 3. Aortic Atherosclerosis (ICD10-I70.0) and Emphysema (ICD10-J43.9). 4. Left perihilar radiation fibrosis without adverse feature. 5. Negative for pulmonary embolism. Electronically Signed   By: Monte Fantasia M.D.   On: 02/13/2019 08:39   CT ABDOMEN PELVIS W CONTRAST  Result Date: 02/06/2019 CLINICAL DATA:  Squamous cell carcinoma along. Evaluate treatment response. Fever and cough. EXAM: CT CHEST, ABDOMEN, AND PELVIS WITH CONTRAST TECHNIQUE: Multidetector CT imaging of the chest, abdomen and pelvis was performed following the standard protocol during bolus administration of intravenous contrast. CONTRAST:  136mL OMNIPAQUE IOHEXOL 300 MG/ML  SOLN COMPARISON:  CT 12/25/2018 FINDINGS: CT CHEST FINDINGS Cardiovascular: Port in the anterior chest wall with tip in distal SVC. Coronary artery calcification and aortic atherosclerotic calcification. Mediastinum/Nodes: No axillary supraclavicular adenopathy. No mediastinal hilar adenopathy. No pericardial effusion. Esophagus normal. Lungs/Pleura: Band of angular consolidation with air bronchograms in LEFT upper lobe unchanged from prior consistent with radiation treatment effect. There is increased ground-glass opacities superimposed on centrilobular emphysema in the  RIGHT upper lobe and posterior aspect of the LEFT lower lobe. No suspicious nodularity. No focal consolidation. There is diffuse ground-glass opacities in the lower lobes additionally. Musculoskeletal: Low-density lesion in the RIGHT hepatic lobe consistent benign cysts. Normal gallbladder. CT ABDOMEN AND PELVIS FINDINGS Hepatobiliary: No focal hepatic lesion. No biliary ductal dilatation. Gallbladder is normal. Common bile duct is normal. Pancreas: Pancreas is normal. No ductal dilatation. No pancreatic inflammation. Spleen: Normal spleen Adrenals/urinary tract: Adrenal glands  normal. Low-density lesions in the kidneys likely represent benign cysts. Ureters and bladder normal. Stomach/Bowel: Stomach, small bowel, appendix, and cecum are normal. The colon and rectosigmoid colon are normal. Vascular/Lymphatic: Abdominal aorta is normal caliber with atherosclerotic calcification. There is no retroperitoneal or periportal lymphadenopathy. No pelvic lymphadenopathy. Reproductive: Prostate normal Other: No peritoneal metastasis Musculoskeletal: No aggressive osseous lesion. IMPRESSION: Chest Impression: 1. New bilateral ground-glass opacities within LEFT and RIGHT lung are nonspecific with broad differential including pulmonary edema, drug reaction, atypical infection or inflammation. 2. Angular consolidation in the LEFT upper lobe consistent radiation treatment. 3. No evidence of lung cancer recurrence. Abdomen / Pelvis Impression: 1. No evidence of metastatic disease in the abdomen pelvis. 2. Benign appearing cysts of the kidneys and liver. 3.  Aortic Atherosclerosis (ICD10-I70.0). Electronically Signed   By: Suzy Bouchard M.D.   On: 02/06/2019 17:41   DG CHEST PORT 1 VIEW  Result Date: 02/18/2019 CLINICAL DATA:  Dyspnea.  Lung cancer history EXAM: PORTABLE CHEST 1 VIEW COMPARISON:  02/17/2019 FINDINGS: Mild progression of right upper lobe and right lower lobe airspace disease, probable pneumonia. Left lower lobe  atelectasis/infiltrate unchanged. Radiation scarring left upper lobe unchanged. Port-A-Cath tip in the upper SVC with coil in the jugular vein as noted previously. No pleural effusion IMPRESSION: Bilateral airspace disease, with mild progression on the right. Probable pneumonia. Electronically Signed   By: Franchot Gallo M.D.   On: 02/18/2019 08:01   DG CHEST PORT 1 VIEW  Result Date: 02/17/2019 CLINICAL DATA:  Dyspnea. EXAM: PORTABLE CHEST 1 VIEW COMPARISON:  February 16, 2019. FINDINGS: Stable cardiomediastinal silhouette. No pneumothorax is noted. Right internal jugular Port-A-Cath is again noted. No pneumothorax is noted. Stable bibasilar opacities are noted concerning for multifocal pneumonia. Pneumomediastinum is not well visualized currently. Bony thorax is unremarkable. IMPRESSION: Stable bibasilar opacities are noted concerning for multifocal pneumonia. Pneumomediastinum is not well visualized currently. Electronically Signed   By: Marijo Conception M.D.   On: 02/17/2019 09:15   DG CHEST PORT 1 VIEW  Result Date: 02/16/2019 CLINICAL DATA:  Pneumomediastinum. EXAM: PORTABLE CHEST 1 VIEW COMPARISON:  February 15, 2019. FINDINGS: Stable cardiomediastinal silhouette. Right internal jugular Port-A-Cath is noted. No definite pneumothorax is noted. Stable minimal pneumomediastinum. Stable bibasilar opacities are noted concerning for pneumonia or atelectasis. Bony thorax is unremarkable. IMPRESSION: Stable minimal pneumomediastinum. Stable bibasilar opacities are noted concerning for pneumonia or atelectasis. Electronically Signed   By: Marijo Conception M.D.   On: 02/16/2019 08:39   DG CHEST PORT 1 VIEW  Result Date: 02/15/2019 CLINICAL DATA:  Pneumonia due to COVID-19 infection EXAM: PORTABLE CHEST 1 VIEW COMPARISON:  02/14/2019 FINDINGS: The right chest wall port a catheter is again noted with tip projecting over the SVC. Endotracheal tube tip is not visualized. Normal heart size. Pneumo mediastinum is stable  to improved in the interval. Fibrosis and masslike architectural distortion from external beam radiation in the left upper lobe is unchanged. Bilateral lower lobe hazy lung opacities consistent with viral pneumonia is similar to the previous study. IMPRESSION: 1. Stable to improved pneumomediastinum. 2. No change in aeration to the lungs compared with previous exam. 3. The previously noted ET tube is not confidently identified on today's exam. Electronically Signed   By: Kerby Moors M.D.   On: 02/15/2019 10:44   DG CHEST PORT 1 VIEW  Addendum Date: 02/14/2019   ADDENDUM REPORT: 02/14/2019 09:03 ADDENDUM: Called by the nurse that the patient is not intubated. The tube over the patient's neck  is thus presumably artifact/external. Electronically Signed   By: Monte Fantasia M.D.   On: 02/14/2019 09:03   Result Date: 02/14/2019 CLINICAL DATA:  Dyspnea EXAM: PORTABLE CHEST 1 VIEW COMPARISON:  02/04/2019 FINDINGS: Pneumomediastinum which has likely progressed from chest CT yesterday. Endotracheal tube with tip above the clavicular heads by 2 cm and above the carina at an estimated 13 cm . Right-sided port with chronic looping above the SVC. Pulmonary infiltrates with asymmetric density on the left, also seen previously. Left perihilar fibrosis. IMPRESSION: 1. High endotracheal tube, tip above the clavicular heads. Suggest advancement by ~3 cm. 2. Pneumomediastinum which is likely progressed from chest CT yesterday. 3. Stable airspace disease. Electronically Signed: By: Monte Fantasia M.D. On: 02/14/2019 08:52   VAS [place] LOWER EXTREMITY VENOUS (DVT)  Result Date: 02/17/2019  Lower Venous Study Indications: Elevated Ddimer.  Risk Factors: COVID 19 positive. Comparison Study: 02/10/2019 - Negative for DVT. Performing Technologist: Oliver Hum RVT  Examination Guidelines: A complete evaluation includes B-mode imaging, spectral Doppler, color Doppler, and power Doppler as needed of all accessible portions of  each vessel. Bilateral testing is considered an integral part of a complete examination. Limited examinations for reoccurring indications may be performed as noted.  +---------+---------------+---------+-----------+----------+--------------+ RIGHT    CompressibilityPhasicitySpontaneityPropertiesThrombus Aging +---------+---------------+---------+-----------+----------+--------------+ CFV      Full           Yes      Yes                                 +---------+---------------+---------+-----------+----------+--------------+ SFJ      Full                                                        +---------+---------------+---------+-----------+----------+--------------+ FV Prox  Full                                                        +---------+---------------+---------+-----------+----------+--------------+ FV Mid   Full                                                        +---------+---------------+---------+-----------+----------+--------------+ FV DistalFull                                                        +---------+---------------+---------+-----------+----------+--------------+ PFV      Full                                                        +---------+---------------+---------+-----------+----------+--------------+ POP      Full           Yes  Yes                                 +---------+---------------+---------+-----------+----------+--------------+ PTV      Full                                                        +---------+---------------+---------+-----------+----------+--------------+ PERO     Full                                                        +---------+---------------+---------+-----------+----------+--------------+   +---------+---------------+---------+-----------+----------+--------------+ LEFT     CompressibilityPhasicitySpontaneityPropertiesThrombus Aging  +---------+---------------+---------+-----------+----------+--------------+ CFV      Full           Yes      Yes                                 +---------+---------------+---------+-----------+----------+--------------+ SFJ      Full                                                        +---------+---------------+---------+-----------+----------+--------------+ FV Prox  Full                                                        +---------+---------------+---------+-----------+----------+--------------+ FV Mid   Full                                                        +---------+---------------+---------+-----------+----------+--------------+ FV DistalFull                                                        +---------+---------------+---------+-----------+----------+--------------+ PFV      Full                                                        +---------+---------------+---------+-----------+----------+--------------+ POP      Full           Yes      Yes                                 +---------+---------------+---------+-----------+----------+--------------+ PTV      Full                                                        +---------+---------------+---------+-----------+----------+--------------+  PERO     Full                                                        +---------+---------------+---------+-----------+----------+--------------+     Summary: Right: There is no evidence of deep vein thrombosis in the lower extremity. No cystic structure found in the popliteal fossa. Left: There is no evidence of deep vein thrombosis in the lower extremity. No cystic structure found in the popliteal fossa.  *See table(s) above for measurements and observations. Electronically signed by Deitra Mayo MD on 02/17/2019 at 5:05:14 PM.    Final    Leg [place] Cone  Result Date: 02/10/2019  Lower Venous Study Indications: Covid-19, elevated  D-Dimer.  Comparison Study: No prior study on file for comparison Performing Technologist: Sharion Dove RVS  Examination Guidelines: A complete evaluation includes B-mode imaging, spectral Doppler, color Doppler, and power Doppler as needed of all accessible portions of each vessel. Bilateral testing is considered an integral part of a complete examination. Limited examinations for reoccurring indications may be performed as noted.  +---------+---------------+---------+-----------+----------+--------------+ RIGHT    CompressibilityPhasicitySpontaneityPropertiesThrombus Aging +---------+---------------+---------+-----------+----------+--------------+ CFV      Full           Yes      Yes                                 +---------+---------------+---------+-----------+----------+--------------+ SFJ      Full                                                        +---------+---------------+---------+-----------+----------+--------------+ FV Prox  Full                                                        +---------+---------------+---------+-----------+----------+--------------+ FV Mid   Full                                                        +---------+---------------+---------+-----------+----------+--------------+ FV DistalFull                                                        +---------+---------------+---------+-----------+----------+--------------+ PFV      Full                                                        +---------+---------------+---------+-----------+----------+--------------+ POP      Full  Yes      Yes                                 +---------+---------------+---------+-----------+----------+--------------+ PTV      Full                                                        +---------+---------------+---------+-----------+----------+--------------+ PERO     Full                                                         +---------+---------------+---------+-----------+----------+--------------+   +---------+---------------+---------+-----------+----------+--------------+ LEFT     CompressibilityPhasicitySpontaneityPropertiesThrombus Aging +---------+---------------+---------+-----------+----------+--------------+ CFV      Full           Yes      Yes                                 +---------+---------------+---------+-----------+----------+--------------+ SFJ      Full                                                        +---------+---------------+---------+-----------+----------+--------------+ FV Prox  Full                                                        +---------+---------------+---------+-----------+----------+--------------+ FV Mid   Full                                                        +---------+---------------+---------+-----------+----------+--------------+ FV DistalFull                                                        +---------+---------------+---------+-----------+----------+--------------+ PFV      Full                                                        +---------+---------------+---------+-----------+----------+--------------+ POP      Full           Yes      Yes                                 +---------+---------------+---------+-----------+----------+--------------+ PTV  Full                                                        +---------+---------------+---------+-----------+----------+--------------+ PERO     Full                                                        +---------+---------------+---------+-----------+----------+--------------+     Summary: Right: There is no evidence of deep vein thrombosis in the lower extremity. Left: There is no evidence of deep vein thrombosis in the lower extremity.  *See table(s) above for measurements and observations. Electronically signed by Monica Martinez MD  on 02/10/2019 at 1:47:34 PM.    Final     Microbiology: Recent Results (from the past 240 hour(s))  Culture, blood (Routine X 2) w Reflex to ID Panel     Status: None   Collection Time: 02/13/19  9:38 AM   Specimen: BLOOD  Result Value Ref Range Status   Specimen Description   Final    BLOOD RIGHT ARM Performed at [facility], [place] [address] [place], [place] [address]    Special Requests   Final    BOTTLES DRAWN AEROBIC AND ANAEROBIC Blood Culture adequate volume Performed at [place] [address], [place], [place] [address]    Culture   Final    NO GROWTH 5 DAYS Performed at [facility], [place] [address], [place], [place] [address]    Report Status 02/18/2019 FINAL  Final  Culture, blood (Routine X 2) w Reflex to ID Panel     Status: None   Collection Time: 02/13/19  9:52 AM   Specimen: BLOOD  Result Value Ref Range Status   Specimen Description   Final    BLOOD RIGHT ARM Performed at [place] [address], [place], [place] [address]    Special Requests   Final    BOTTLES DRAWN AEROBIC AND ANAEROBIC Blood Culture adequate volume Performed at [place] [address] [place], [place] [address]    Culture   Final    NO GROWTH 5 DAYS Performed at [facility], [place] [address], [place], [place] [address]    Report Status 02/18/2019 FINAL  Final  MRSA PCR Screening     Status: None   Collection Time: 02/14/19  5:00 PM   Specimen: Nasal Mucosa; Nasopharyngeal  Result Value Ref Range Status   MRSA by PCR NEGATIVE NEGATIVE Final    Comment:        The GeneXpert MRSA Assay (FDA approved for NASAL specimens only), is one component of a comprehensive MRSA colonization surveillance program. It is not intended to diagnose MRSA infection nor to guide or monitor treatment for MRSA infections. Performed at [facility], [place] [address], [place],   [address]      Labs: Basic Metabolic Panel: Recent Labs  Lab 02/16/19 0947 02/17/19 0412 02/18/19 0745 02/19/19 0545 03/18/2019 0420  NA 140 139 138 139 140  K 3.8 4.8 4.2 4.6 4.7  CL 102 103 100 102 105  CO2 24 25 26 25 22   GLUCOSE 144*  85 101* 84 106*  BUN 30* 25* 23 25* 34*  CREATININE 1.17 1.05 0.90 0.99 1.11  CALCIUM 8.6* 8.3* 8.4* 8.4* 8.2*   Liver Function Tests: Recent Labs  Lab 02/16/19 0947  AST 61*  ALT 45*  ALKPHOS 199*  BILITOT 0.8  PROT 7.1  ALBUMIN 3.4*   No results for input(s): LIPASE, AMYLASE in the last 168 hours. No results for input(s): AMMONIA in the last 168 hours. CBC: Recent Labs  Lab 02/16/19 0947 02/17/19 0412 02/18/19 0745 02/19/19 0545 Feb 27, 2019 0420  WBC 14.8* 12.3* 12.5* 15.1* 15.5*  NEUTROABS 14.0* 11.2* 11.6* 14.1* 14.5*  HGB 15.5 14.4 15.0 14.5 13.6  HCT 49.2 45.2 46.5 45.6 41.8  MCV 87.9 87.3 88.1 87.4 86.9  PLT 166 110* 103* 95* 81*   Cardiac Enzymes: No results for input(s): CKTOTAL, CKMB, CKMBINDEX, TROPONINI in the last 168 hours. D-Dimer Recent Labs    02/19/19 0545 27-Feb-2019 0420  DDIMER 5.42* 5.87*   BNP: Invalid input(s): POCBNP CBG: Recent Labs  Lab 02/19/19 0732 02/19/19 1139 02/19/19 1556  GLUCAP 102* 148* 134*   Anemia work up No results for input(s): VITAMINB12, FOLATE, FERRITIN, TIBC, IRON, RETICCTPCT in the last 72 hours. Urinalysis    Component Value Date/Time   COLORURINE YELLOW (A) 02/04/2019 2232   APPEARANCEUR HAZY (A) 02/04/2019 2232   LABSPEC 1.020 02/04/2019 2232   PHURINE 5.0 02/04/2019 2232   GLUCOSEU NEGATIVE 02/04/2019 2232   HGBUR NEGATIVE 02/04/2019 2232   BILIRUBINUR NEGATIVE 02/04/2019 2232   KETONESUR NEGATIVE 02/04/2019 2232   PROTEINUR 30 (A) 02/04/2019 2232   NITRITE NEGATIVE 02/04/2019 2232   LEUKOCYTESUR NEGATIVE 02/04/2019 2232   Sepsis Labs Invalid input(s): PROCALCITONIN,  WBC,  LACTICIDVEN     SIGNED:  Charlynne Cousins, MD  [facility] 02/21/2019, 10:54 AM Pager   If 7PM-7AM, please contact night-coverage [email or web address] Password [email or web address]

## 2019-03-18 NOTE — Progress Notes (Signed)
0730 Nurse received report from night nurse, pt on cardiac monitor and high flow )[address] [place]. Pt aggitated  and restless but not alert or following commands at this time. Pt having wet congested respirations. Per Night nurse pt was on comfort measures and had done a family end of life visit in the early morning hours. MD [place] Pink notified for an increase in comfort medications as patient was still restless and uncomfortable. Patients other daughter Jeannene Patella now in from out of town and requesting visit with patient.  0750 Pt medicated with new orders from MD, and transported to family visit room on 1st floor on portable monitor and [place]. Daughter Pam brought to bedside for visit. During visit pts HR dropped and pt became bradycardic, Pam made aware that pt will likely be passing soon. Patient became asystolic at 2505 with no palpable pulse and no respiratory effort. Telemetry called and notified. MD made aware.

## 2019-03-18 DEATH — deceased

## 2019-03-29 ENCOUNTER — Ambulatory Visit: Payer: Medicare HMO

## 2019-04-02 ENCOUNTER — Other Ambulatory Visit: Payer: Medicare HMO

## 2019-04-02 ENCOUNTER — Ambulatory Visit: Payer: Medicare HMO | Admitting: Oncology

## 2019-06-21 ENCOUNTER — Ambulatory Visit: Payer: Medicare HMO | Admitting: Radiation Oncology

## 2020-01-17 ENCOUNTER — Encounter: Payer: Medicare HMO | Admitting: Internal Medicine

## 2020-08-08 IMAGING — DX DG CHEST 1V PORT
1 series · 1 of 1 positions shown · non-contrast
Comparison: 02/17/2019

CLINICAL DATA: Dyspnea.  Lung cancer history

EXAM:
PORTABLE CHEST 1 VIEW

[chest]
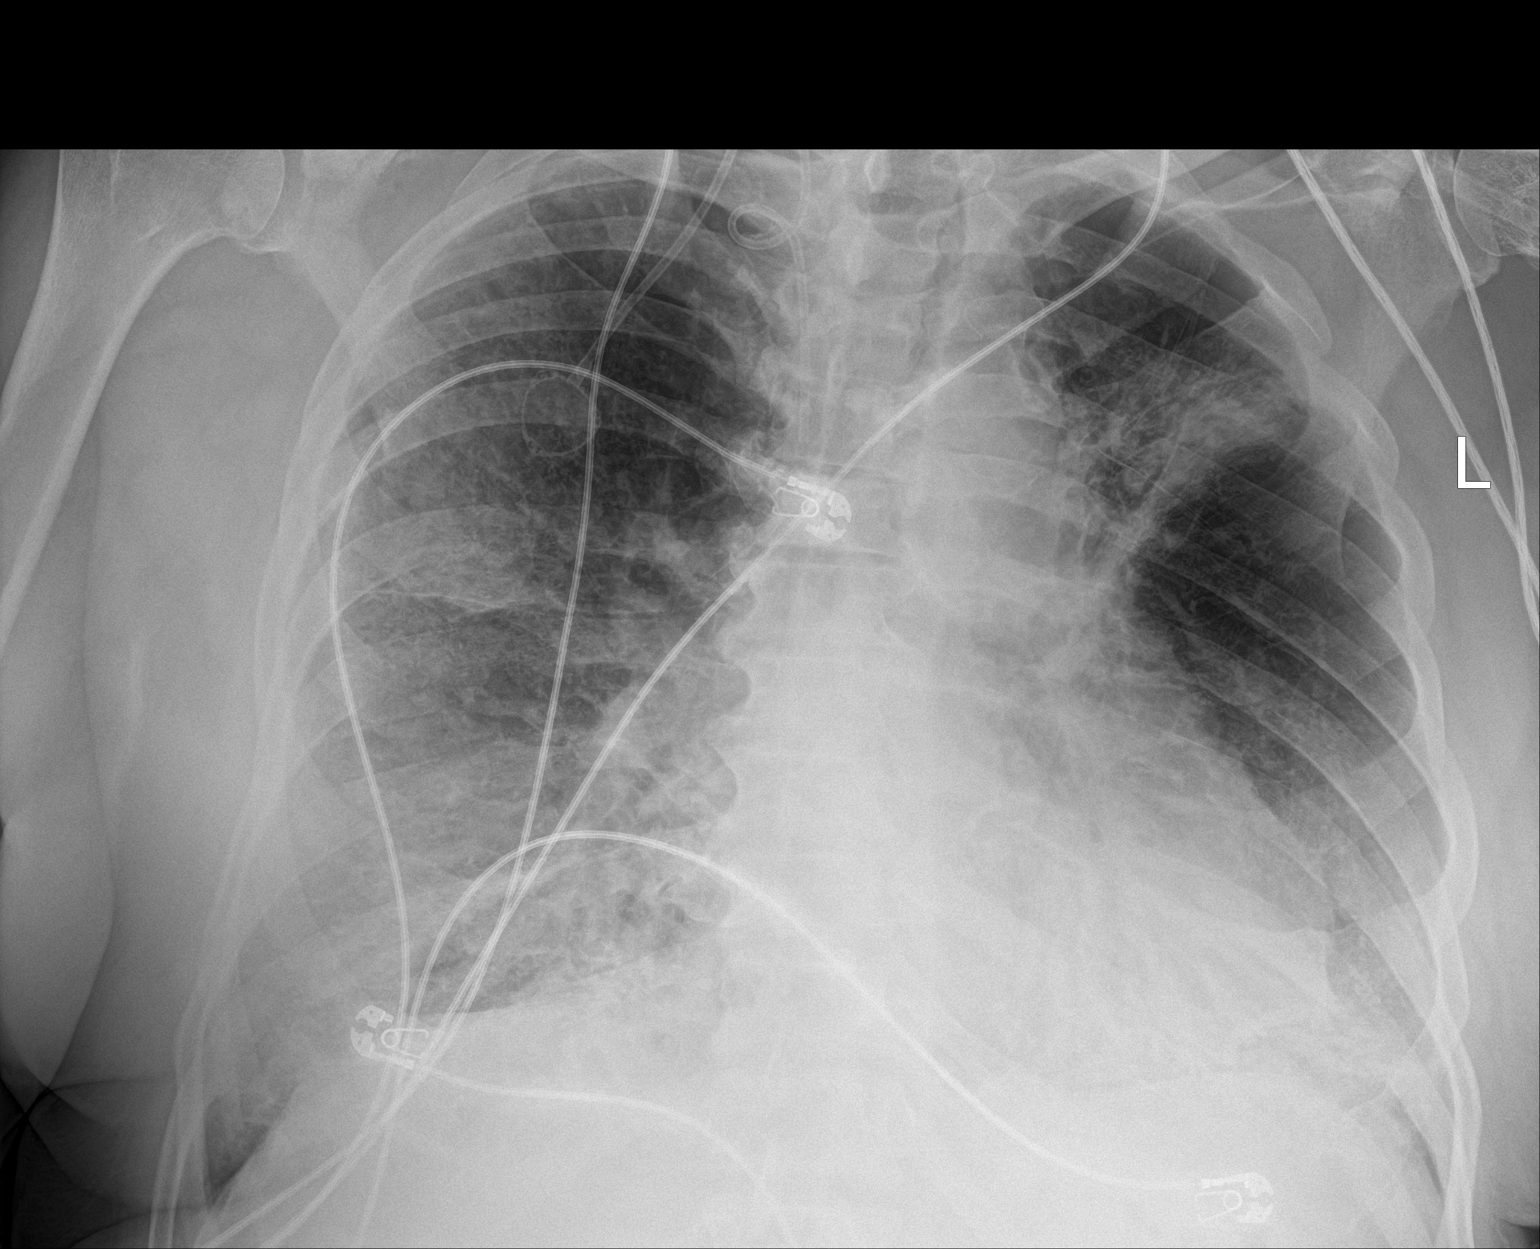

[1 of 1 positions shown; findings below may reference images not displayed]

FINDINGS: Mild progression of right upper lobe and right lower lobe airspace
disease, probable pneumonia. Left lower lobe atelectasis/infiltrate
unchanged. Radiation scarring left upper lobe unchanged.

Port-A-Cath tip in the upper SVC with coil in the jugular vein as
noted previously. No pleural effusion
IMPRESSION: Bilateral airspace disease, with mild progression on the right.
Probable pneumonia.
